# Patient Record
Sex: Male | Born: 1950 | Race: Black or African American | Hispanic: No | Marital: Married | Smoking: Former smoker
Health system: Southern US, Community
[De-identification: ages and names within clinical notes are randomized; demographics above are authoritative.]

## PROBLEM LIST (undated history)

## (undated) DIAGNOSIS — I639 Cerebral infarction, unspecified: Secondary | ICD-10-CM

## (undated) DIAGNOSIS — K8020 Calculus of gallbladder without cholecystitis without obstruction: Secondary | ICD-10-CM

## (undated) DIAGNOSIS — I5022 Chronic systolic (congestive) heart failure: Secondary | ICD-10-CM

## (undated) DIAGNOSIS — N186 End stage renal disease: Secondary | ICD-10-CM

## (undated) DIAGNOSIS — Z992 Dependence on renal dialysis: Secondary | ICD-10-CM

## (undated) DIAGNOSIS — I2510 Atherosclerotic heart disease of native coronary artery without angina pectoris: Secondary | ICD-10-CM

## (undated) HISTORY — PX: AV FISTULA PLACEMENT: SHX1204

---

## 1998-10-11 ENCOUNTER — Encounter: Payer: Self-pay | Admitting: Ophthalmology

## 1998-10-11 ENCOUNTER — Ambulatory Visit (HOSPITAL_COMMUNITY): Admission: RE | Admit: 1998-10-11 | Discharge: 1998-10-12 | Payer: Self-pay | Admitting: Ophthalmology

## 1999-04-11 ENCOUNTER — Ambulatory Visit (HOSPITAL_COMMUNITY): Admission: RE | Admit: 1999-04-11 | Discharge: 1999-04-12 | Payer: Self-pay | Admitting: Ophthalmology

## 2004-02-13 ENCOUNTER — Other Ambulatory Visit: Payer: Self-pay

## 2004-07-25 ENCOUNTER — Other Ambulatory Visit: Payer: Self-pay

## 2004-07-25 ENCOUNTER — Inpatient Hospital Stay: Payer: Self-pay

## 2005-05-04 ENCOUNTER — Ambulatory Visit: Payer: Self-pay | Admitting: Ophthalmology

## 2005-05-09 ENCOUNTER — Ambulatory Visit: Payer: Self-pay | Admitting: Ophthalmology

## 2006-01-16 ENCOUNTER — Ambulatory Visit: Payer: Self-pay | Admitting: Ophthalmology

## 2006-01-23 ENCOUNTER — Ambulatory Visit: Payer: Self-pay | Admitting: Ophthalmology

## 2006-10-07 ENCOUNTER — Other Ambulatory Visit: Payer: Self-pay

## 2006-10-07 ENCOUNTER — Inpatient Hospital Stay: Payer: Self-pay | Admitting: Internal Medicine

## 2006-12-16 ENCOUNTER — Ambulatory Visit: Payer: Self-pay | Admitting: Vascular Surgery

## 2007-01-20 ENCOUNTER — Ambulatory Visit: Payer: Self-pay | Admitting: Vascular Surgery

## 2007-01-20 ENCOUNTER — Other Ambulatory Visit: Payer: Self-pay

## 2007-01-23 ENCOUNTER — Ambulatory Visit: Payer: Self-pay | Admitting: Vascular Surgery

## 2007-04-29 ENCOUNTER — Ambulatory Visit: Payer: Self-pay | Admitting: Vascular Surgery

## 2008-12-23 ENCOUNTER — Ambulatory Visit: Payer: Self-pay | Admitting: Gastroenterology

## 2008-12-28 ENCOUNTER — Ambulatory Visit: Payer: Self-pay | Admitting: Gastroenterology

## 2009-01-02 LAB — COLONOSCOPY

## 2010-09-19 ENCOUNTER — Ambulatory Visit: Payer: Self-pay

## 2011-10-23 ENCOUNTER — Ambulatory Visit: Payer: Self-pay | Admitting: *Deleted

## 2012-08-26 DIAGNOSIS — Z7682 Awaiting organ transplant status: Secondary | ICD-10-CM | POA: Insufficient documentation

## 2012-12-25 DIAGNOSIS — R931 Abnormal findings on diagnostic imaging of heart and coronary circulation: Secondary | ICD-10-CM | POA: Insufficient documentation

## 2013-05-30 ENCOUNTER — Observation Stay: Payer: Self-pay | Admitting: Internal Medicine

## 2013-05-30 LAB — COMPREHENSIVE METABOLIC PANEL
Albumin: 3.4 g/dL (ref 3.4–5.0)
Alkaline Phosphatase: 104 U/L (ref 50–136)
Anion Gap: 10 (ref 7–16)
BUN: 29 mg/dL — ABNORMAL HIGH (ref 7–18)
Bilirubin,Total: 0.6 mg/dL (ref 0.2–1.0)
Calcium, Total: 8.9 mg/dL (ref 8.5–10.1)
Chloride: 99 mmol/L (ref 98–107)
Co2: 22 mmol/L (ref 21–32)
Creatinine: 9.22 mg/dL — ABNORMAL HIGH (ref 0.60–1.30)
EGFR (African American): 6 — ABNORMAL LOW
EGFR (Non-African Amer.): 6 — ABNORMAL LOW
Glucose: 143 mg/dL — ABNORMAL HIGH (ref 65–99)
Osmolality: 271 (ref 275–301)
Potassium: 5.5 mmol/L — ABNORMAL HIGH (ref 3.5–5.1)
SGOT(AST): 34 U/L (ref 15–37)
SGPT (ALT): 14 U/L (ref 12–78)
Sodium: 131 mmol/L — ABNORMAL LOW (ref 136–145)
Total Protein: 7.7 g/dL (ref 6.4–8.2)

## 2013-05-30 LAB — CBC
HCT: 37.3 % — ABNORMAL LOW (ref 40.0–52.0)
HGB: 12.7 g/dL — ABNORMAL LOW (ref 13.0–18.0)
MCH: 30.3 pg (ref 26.0–34.0)
MCHC: 34 g/dL (ref 32.0–36.0)
MCV: 89 fL (ref 80–100)
Platelet: 217 10*3/uL (ref 150–440)
RBC: 4.17 10*6/uL — ABNORMAL LOW (ref 4.40–5.90)
RDW: 13.7 % (ref 11.5–14.5)
WBC: 7.2 10*3/uL (ref 3.8–10.6)

## 2013-05-30 LAB — URINALYSIS, COMPLETE
Bilirubin,UR: NEGATIVE
Glucose,UR: NEGATIVE mg/dL (ref 0–75)
Hyaline Cast: 3
Ketone: NEGATIVE
Nitrite: NEGATIVE
Ph: 5 (ref 4.5–8.0)
Protein: >=500
RBC,UR: 182 /HPF (ref 0–5)
Specific Gravity: 1.016 (ref 1.003–1.030)
Squamous Epithelial: 1
WBC UR: 242 /HPF (ref 0–5)

## 2013-05-30 LAB — PROTIME-INR
INR: 1
Prothrombin Time: 13.4 secs (ref 11.5–14.7)

## 2013-05-30 LAB — APTT: Activated PTT: 27 secs (ref 23.6–35.9)

## 2013-05-30 LAB — TROPONIN I: Troponin-I: <0.02 ng/mL

## 2013-05-31 LAB — LIPID PANEL
Cholesterol: 184 mg/dL (ref 0–200)
HDL Cholesterol: 37 mg/dL — ABNORMAL LOW (ref 40–60)
Ldl Cholesterol, Calc: 111 mg/dL — ABNORMAL HIGH (ref 0–100)
Triglycerides: 182 mg/dL (ref 0–200)
VLDL Cholesterol, Calc: 36 mg/dL (ref 5–40)

## 2013-05-31 LAB — BASIC METABOLIC PANEL
Anion Gap: 8 (ref 7–16)
BUN: 37 mg/dL — ABNORMAL HIGH (ref 7–18)
Calcium, Total: 8.9 mg/dL (ref 8.5–10.1)
Chloride: 99 mmol/L (ref 98–107)
Co2: 30 mmol/L (ref 21–32)
Creatinine: 11.32 mg/dL — ABNORMAL HIGH (ref 0.60–1.30)
EGFR (African American): 5 — ABNORMAL LOW
EGFR (Non-African Amer.): 4 — ABNORMAL LOW
Glucose: 135 mg/dL — ABNORMAL HIGH (ref 65–99)
Osmolality: 285 (ref 275–301)
Potassium: 3.8 mmol/L (ref 3.5–5.1)
Sodium: 137 mmol/L (ref 136–145)

## 2013-05-31 LAB — URINE CULTURE

## 2013-08-21 DIAGNOSIS — N2581 Secondary hyperparathyroidism of renal origin: Secondary | ICD-10-CM | POA: Diagnosis not present

## 2013-08-21 DIAGNOSIS — E1129 Type 2 diabetes mellitus with other diabetic kidney complication: Secondary | ICD-10-CM | POA: Diagnosis not present

## 2013-08-21 DIAGNOSIS — D631 Anemia in chronic kidney disease: Secondary | ICD-10-CM | POA: Diagnosis not present

## 2013-08-21 DIAGNOSIS — N186 End stage renal disease: Secondary | ICD-10-CM | POA: Diagnosis not present

## 2013-09-14 DIAGNOSIS — H18419 Arcus senilis, unspecified eye: Secondary | ICD-10-CM | POA: Diagnosis not present

## 2013-09-14 DIAGNOSIS — E11319 Type 2 diabetes mellitus with unspecified diabetic retinopathy without macular edema: Secondary | ICD-10-CM | POA: Diagnosis not present

## 2013-09-14 DIAGNOSIS — H02839 Dermatochalasis of unspecified eye, unspecified eyelid: Secondary | ICD-10-CM | POA: Diagnosis not present

## 2013-09-14 DIAGNOSIS — H26499 Other secondary cataract, unspecified eye: Secondary | ICD-10-CM | POA: Diagnosis not present

## 2013-09-19 DIAGNOSIS — N186 End stage renal disease: Secondary | ICD-10-CM | POA: Diagnosis not present

## 2013-09-21 DIAGNOSIS — D631 Anemia in chronic kidney disease: Secondary | ICD-10-CM | POA: Diagnosis not present

## 2013-09-21 DIAGNOSIS — E1129 Type 2 diabetes mellitus with other diabetic kidney complication: Secondary | ICD-10-CM | POA: Diagnosis not present

## 2013-09-21 DIAGNOSIS — N186 End stage renal disease: Secondary | ICD-10-CM | POA: Diagnosis not present

## 2013-09-21 DIAGNOSIS — N039 Chronic nephritic syndrome with unspecified morphologic changes: Secondary | ICD-10-CM | POA: Diagnosis not present

## 2013-09-21 DIAGNOSIS — N2581 Secondary hyperparathyroidism of renal origin: Secondary | ICD-10-CM | POA: Diagnosis not present

## 2013-09-28 DIAGNOSIS — H26499 Other secondary cataract, unspecified eye: Secondary | ICD-10-CM | POA: Diagnosis not present

## 2013-10-08 DIAGNOSIS — H26499 Other secondary cataract, unspecified eye: Secondary | ICD-10-CM | POA: Diagnosis not present

## 2013-10-17 DIAGNOSIS — N186 End stage renal disease: Secondary | ICD-10-CM | POA: Diagnosis not present

## 2013-10-19 DIAGNOSIS — D509 Iron deficiency anemia, unspecified: Secondary | ICD-10-CM | POA: Diagnosis not present

## 2013-10-19 DIAGNOSIS — N2581 Secondary hyperparathyroidism of renal origin: Secondary | ICD-10-CM | POA: Diagnosis not present

## 2013-10-19 DIAGNOSIS — N186 End stage renal disease: Secondary | ICD-10-CM | POA: Diagnosis not present

## 2013-10-19 DIAGNOSIS — N039 Chronic nephritic syndrome with unspecified morphologic changes: Secondary | ICD-10-CM | POA: Diagnosis not present

## 2013-10-19 DIAGNOSIS — E1129 Type 2 diabetes mellitus with other diabetic kidney complication: Secondary | ICD-10-CM | POA: Diagnosis not present

## 2013-10-19 DIAGNOSIS — D631 Anemia in chronic kidney disease: Secondary | ICD-10-CM | POA: Diagnosis not present

## 2013-10-21 DIAGNOSIS — E1129 Type 2 diabetes mellitus with other diabetic kidney complication: Secondary | ICD-10-CM | POA: Diagnosis not present

## 2013-10-21 DIAGNOSIS — D631 Anemia in chronic kidney disease: Secondary | ICD-10-CM | POA: Diagnosis not present

## 2013-10-21 DIAGNOSIS — N2581 Secondary hyperparathyroidism of renal origin: Secondary | ICD-10-CM | POA: Diagnosis not present

## 2013-10-21 DIAGNOSIS — N186 End stage renal disease: Secondary | ICD-10-CM | POA: Diagnosis not present

## 2013-10-21 DIAGNOSIS — N039 Chronic nephritic syndrome with unspecified morphologic changes: Secondary | ICD-10-CM | POA: Diagnosis not present

## 2013-10-21 DIAGNOSIS — D509 Iron deficiency anemia, unspecified: Secondary | ICD-10-CM | POA: Diagnosis not present

## 2013-10-23 DIAGNOSIS — D509 Iron deficiency anemia, unspecified: Secondary | ICD-10-CM | POA: Diagnosis not present

## 2013-10-23 DIAGNOSIS — N186 End stage renal disease: Secondary | ICD-10-CM | POA: Diagnosis not present

## 2013-10-23 DIAGNOSIS — D631 Anemia in chronic kidney disease: Secondary | ICD-10-CM | POA: Diagnosis not present

## 2013-10-23 DIAGNOSIS — N2581 Secondary hyperparathyroidism of renal origin: Secondary | ICD-10-CM | POA: Diagnosis not present

## 2013-10-23 DIAGNOSIS — E1129 Type 2 diabetes mellitus with other diabetic kidney complication: Secondary | ICD-10-CM | POA: Diagnosis not present

## 2013-10-26 DIAGNOSIS — N186 End stage renal disease: Secondary | ICD-10-CM | POA: Diagnosis not present

## 2013-10-26 DIAGNOSIS — E1129 Type 2 diabetes mellitus with other diabetic kidney complication: Secondary | ICD-10-CM | POA: Diagnosis not present

## 2013-10-26 DIAGNOSIS — D509 Iron deficiency anemia, unspecified: Secondary | ICD-10-CM | POA: Diagnosis not present

## 2013-10-26 DIAGNOSIS — N2581 Secondary hyperparathyroidism of renal origin: Secondary | ICD-10-CM | POA: Diagnosis not present

## 2013-10-26 DIAGNOSIS — D631 Anemia in chronic kidney disease: Secondary | ICD-10-CM | POA: Diagnosis not present

## 2013-10-28 DIAGNOSIS — N039 Chronic nephritic syndrome with unspecified morphologic changes: Secondary | ICD-10-CM | POA: Diagnosis not present

## 2013-10-28 DIAGNOSIS — E1129 Type 2 diabetes mellitus with other diabetic kidney complication: Secondary | ICD-10-CM | POA: Diagnosis not present

## 2013-10-28 DIAGNOSIS — N186 End stage renal disease: Secondary | ICD-10-CM | POA: Diagnosis not present

## 2013-10-28 DIAGNOSIS — D631 Anemia in chronic kidney disease: Secondary | ICD-10-CM | POA: Diagnosis not present

## 2013-10-28 DIAGNOSIS — N2581 Secondary hyperparathyroidism of renal origin: Secondary | ICD-10-CM | POA: Diagnosis not present

## 2013-10-28 DIAGNOSIS — D509 Iron deficiency anemia, unspecified: Secondary | ICD-10-CM | POA: Diagnosis not present

## 2013-10-30 DIAGNOSIS — N2581 Secondary hyperparathyroidism of renal origin: Secondary | ICD-10-CM | POA: Diagnosis not present

## 2013-10-30 DIAGNOSIS — D631 Anemia in chronic kidney disease: Secondary | ICD-10-CM | POA: Diagnosis not present

## 2013-10-30 DIAGNOSIS — N186 End stage renal disease: Secondary | ICD-10-CM | POA: Diagnosis not present

## 2013-10-30 DIAGNOSIS — E1129 Type 2 diabetes mellitus with other diabetic kidney complication: Secondary | ICD-10-CM | POA: Diagnosis not present

## 2013-10-30 DIAGNOSIS — D509 Iron deficiency anemia, unspecified: Secondary | ICD-10-CM | POA: Diagnosis not present

## 2013-11-02 DIAGNOSIS — N2581 Secondary hyperparathyroidism of renal origin: Secondary | ICD-10-CM | POA: Diagnosis not present

## 2013-11-02 DIAGNOSIS — D631 Anemia in chronic kidney disease: Secondary | ICD-10-CM | POA: Diagnosis not present

## 2013-11-02 DIAGNOSIS — E1129 Type 2 diabetes mellitus with other diabetic kidney complication: Secondary | ICD-10-CM | POA: Diagnosis not present

## 2013-11-02 DIAGNOSIS — N186 End stage renal disease: Secondary | ICD-10-CM | POA: Diagnosis not present

## 2013-11-02 DIAGNOSIS — D509 Iron deficiency anemia, unspecified: Secondary | ICD-10-CM | POA: Diagnosis not present

## 2013-11-04 DIAGNOSIS — N2581 Secondary hyperparathyroidism of renal origin: Secondary | ICD-10-CM | POA: Diagnosis not present

## 2013-11-04 DIAGNOSIS — D631 Anemia in chronic kidney disease: Secondary | ICD-10-CM | POA: Diagnosis not present

## 2013-11-04 DIAGNOSIS — D509 Iron deficiency anemia, unspecified: Secondary | ICD-10-CM | POA: Diagnosis not present

## 2013-11-04 DIAGNOSIS — E1129 Type 2 diabetes mellitus with other diabetic kidney complication: Secondary | ICD-10-CM | POA: Diagnosis not present

## 2013-11-04 DIAGNOSIS — N186 End stage renal disease: Secondary | ICD-10-CM | POA: Diagnosis not present

## 2013-11-06 DIAGNOSIS — N2581 Secondary hyperparathyroidism of renal origin: Secondary | ICD-10-CM | POA: Diagnosis not present

## 2013-11-06 DIAGNOSIS — N186 End stage renal disease: Secondary | ICD-10-CM | POA: Diagnosis not present

## 2013-11-06 DIAGNOSIS — D631 Anemia in chronic kidney disease: Secondary | ICD-10-CM | POA: Diagnosis not present

## 2013-11-06 DIAGNOSIS — D509 Iron deficiency anemia, unspecified: Secondary | ICD-10-CM | POA: Diagnosis not present

## 2013-11-06 DIAGNOSIS — E1129 Type 2 diabetes mellitus with other diabetic kidney complication: Secondary | ICD-10-CM | POA: Diagnosis not present

## 2013-11-09 DIAGNOSIS — D631 Anemia in chronic kidney disease: Secondary | ICD-10-CM | POA: Diagnosis not present

## 2013-11-09 DIAGNOSIS — E1129 Type 2 diabetes mellitus with other diabetic kidney complication: Secondary | ICD-10-CM | POA: Diagnosis not present

## 2013-11-09 DIAGNOSIS — N186 End stage renal disease: Secondary | ICD-10-CM | POA: Diagnosis not present

## 2013-11-09 DIAGNOSIS — N039 Chronic nephritic syndrome with unspecified morphologic changes: Secondary | ICD-10-CM | POA: Diagnosis not present

## 2013-11-09 DIAGNOSIS — D509 Iron deficiency anemia, unspecified: Secondary | ICD-10-CM | POA: Diagnosis not present

## 2013-11-09 DIAGNOSIS — N2581 Secondary hyperparathyroidism of renal origin: Secondary | ICD-10-CM | POA: Diagnosis not present

## 2013-11-11 DIAGNOSIS — N2581 Secondary hyperparathyroidism of renal origin: Secondary | ICD-10-CM | POA: Diagnosis not present

## 2013-11-11 DIAGNOSIS — D631 Anemia in chronic kidney disease: Secondary | ICD-10-CM | POA: Diagnosis not present

## 2013-11-11 DIAGNOSIS — E1129 Type 2 diabetes mellitus with other diabetic kidney complication: Secondary | ICD-10-CM | POA: Diagnosis not present

## 2013-11-11 DIAGNOSIS — N186 End stage renal disease: Secondary | ICD-10-CM | POA: Diagnosis not present

## 2013-11-11 DIAGNOSIS — D509 Iron deficiency anemia, unspecified: Secondary | ICD-10-CM | POA: Diagnosis not present

## 2013-11-13 DIAGNOSIS — E1129 Type 2 diabetes mellitus with other diabetic kidney complication: Secondary | ICD-10-CM | POA: Diagnosis not present

## 2013-11-13 DIAGNOSIS — N2581 Secondary hyperparathyroidism of renal origin: Secondary | ICD-10-CM | POA: Diagnosis not present

## 2013-11-13 DIAGNOSIS — D631 Anemia in chronic kidney disease: Secondary | ICD-10-CM | POA: Diagnosis not present

## 2013-11-13 DIAGNOSIS — N186 End stage renal disease: Secondary | ICD-10-CM | POA: Diagnosis not present

## 2013-11-13 DIAGNOSIS — N039 Chronic nephritic syndrome with unspecified morphologic changes: Secondary | ICD-10-CM | POA: Diagnosis not present

## 2013-11-13 DIAGNOSIS — D509 Iron deficiency anemia, unspecified: Secondary | ICD-10-CM | POA: Diagnosis not present

## 2013-11-16 DIAGNOSIS — E1129 Type 2 diabetes mellitus with other diabetic kidney complication: Secondary | ICD-10-CM | POA: Diagnosis not present

## 2013-11-16 DIAGNOSIS — N186 End stage renal disease: Secondary | ICD-10-CM | POA: Diagnosis not present

## 2013-11-16 DIAGNOSIS — N2581 Secondary hyperparathyroidism of renal origin: Secondary | ICD-10-CM | POA: Diagnosis not present

## 2013-11-16 DIAGNOSIS — D509 Iron deficiency anemia, unspecified: Secondary | ICD-10-CM | POA: Diagnosis not present

## 2013-11-16 DIAGNOSIS — D631 Anemia in chronic kidney disease: Secondary | ICD-10-CM | POA: Diagnosis not present

## 2013-11-17 DIAGNOSIS — N186 End stage renal disease: Secondary | ICD-10-CM | POA: Diagnosis not present

## 2013-11-18 DIAGNOSIS — N039 Chronic nephritic syndrome with unspecified morphologic changes: Secondary | ICD-10-CM | POA: Diagnosis not present

## 2013-11-18 DIAGNOSIS — D509 Iron deficiency anemia, unspecified: Secondary | ICD-10-CM | POA: Diagnosis not present

## 2013-11-18 DIAGNOSIS — N186 End stage renal disease: Secondary | ICD-10-CM | POA: Diagnosis not present

## 2013-11-18 DIAGNOSIS — D631 Anemia in chronic kidney disease: Secondary | ICD-10-CM | POA: Diagnosis not present

## 2013-11-18 DIAGNOSIS — N2581 Secondary hyperparathyroidism of renal origin: Secondary | ICD-10-CM | POA: Diagnosis not present

## 2013-11-18 DIAGNOSIS — Z23 Encounter for immunization: Secondary | ICD-10-CM | POA: Diagnosis not present

## 2013-11-18 DIAGNOSIS — E1129 Type 2 diabetes mellitus with other diabetic kidney complication: Secondary | ICD-10-CM | POA: Diagnosis not present

## 2013-11-20 DIAGNOSIS — Z23 Encounter for immunization: Secondary | ICD-10-CM | POA: Diagnosis not present

## 2013-11-20 DIAGNOSIS — N186 End stage renal disease: Secondary | ICD-10-CM | POA: Diagnosis not present

## 2013-11-20 DIAGNOSIS — E1129 Type 2 diabetes mellitus with other diabetic kidney complication: Secondary | ICD-10-CM | POA: Diagnosis not present

## 2013-11-20 DIAGNOSIS — D509 Iron deficiency anemia, unspecified: Secondary | ICD-10-CM | POA: Diagnosis not present

## 2013-11-20 DIAGNOSIS — N2581 Secondary hyperparathyroidism of renal origin: Secondary | ICD-10-CM | POA: Diagnosis not present

## 2013-11-20 DIAGNOSIS — D631 Anemia in chronic kidney disease: Secondary | ICD-10-CM | POA: Diagnosis not present

## 2013-11-23 DIAGNOSIS — Z23 Encounter for immunization: Secondary | ICD-10-CM | POA: Diagnosis not present

## 2013-11-23 DIAGNOSIS — N039 Chronic nephritic syndrome with unspecified morphologic changes: Secondary | ICD-10-CM | POA: Diagnosis not present

## 2013-11-23 DIAGNOSIS — D509 Iron deficiency anemia, unspecified: Secondary | ICD-10-CM | POA: Diagnosis not present

## 2013-11-23 DIAGNOSIS — N186 End stage renal disease: Secondary | ICD-10-CM | POA: Diagnosis not present

## 2013-11-23 DIAGNOSIS — N2581 Secondary hyperparathyroidism of renal origin: Secondary | ICD-10-CM | POA: Diagnosis not present

## 2013-11-23 DIAGNOSIS — E1129 Type 2 diabetes mellitus with other diabetic kidney complication: Secondary | ICD-10-CM | POA: Diagnosis not present

## 2013-11-23 DIAGNOSIS — D631 Anemia in chronic kidney disease: Secondary | ICD-10-CM | POA: Diagnosis not present

## 2013-11-25 DIAGNOSIS — Z23 Encounter for immunization: Secondary | ICD-10-CM | POA: Diagnosis not present

## 2013-11-25 DIAGNOSIS — N2581 Secondary hyperparathyroidism of renal origin: Secondary | ICD-10-CM | POA: Diagnosis not present

## 2013-11-25 DIAGNOSIS — E1129 Type 2 diabetes mellitus with other diabetic kidney complication: Secondary | ICD-10-CM | POA: Diagnosis not present

## 2013-11-25 DIAGNOSIS — D631 Anemia in chronic kidney disease: Secondary | ICD-10-CM | POA: Diagnosis not present

## 2013-11-25 DIAGNOSIS — N186 End stage renal disease: Secondary | ICD-10-CM | POA: Diagnosis not present

## 2013-11-25 DIAGNOSIS — D509 Iron deficiency anemia, unspecified: Secondary | ICD-10-CM | POA: Diagnosis not present

## 2013-11-27 DIAGNOSIS — D509 Iron deficiency anemia, unspecified: Secondary | ICD-10-CM | POA: Diagnosis not present

## 2013-11-27 DIAGNOSIS — D631 Anemia in chronic kidney disease: Secondary | ICD-10-CM | POA: Diagnosis not present

## 2013-11-27 DIAGNOSIS — E1129 Type 2 diabetes mellitus with other diabetic kidney complication: Secondary | ICD-10-CM | POA: Diagnosis not present

## 2013-11-27 DIAGNOSIS — N039 Chronic nephritic syndrome with unspecified morphologic changes: Secondary | ICD-10-CM | POA: Diagnosis not present

## 2013-11-27 DIAGNOSIS — N186 End stage renal disease: Secondary | ICD-10-CM | POA: Diagnosis not present

## 2013-11-27 DIAGNOSIS — N2581 Secondary hyperparathyroidism of renal origin: Secondary | ICD-10-CM | POA: Diagnosis not present

## 2013-11-27 DIAGNOSIS — Z23 Encounter for immunization: Secondary | ICD-10-CM | POA: Diagnosis not present

## 2013-11-30 DIAGNOSIS — N2581 Secondary hyperparathyroidism of renal origin: Secondary | ICD-10-CM | POA: Diagnosis not present

## 2013-11-30 DIAGNOSIS — D631 Anemia in chronic kidney disease: Secondary | ICD-10-CM | POA: Diagnosis not present

## 2013-11-30 DIAGNOSIS — N039 Chronic nephritic syndrome with unspecified morphologic changes: Secondary | ICD-10-CM | POA: Diagnosis not present

## 2013-11-30 DIAGNOSIS — D509 Iron deficiency anemia, unspecified: Secondary | ICD-10-CM | POA: Diagnosis not present

## 2013-11-30 DIAGNOSIS — E1129 Type 2 diabetes mellitus with other diabetic kidney complication: Secondary | ICD-10-CM | POA: Diagnosis not present

## 2013-11-30 DIAGNOSIS — Z23 Encounter for immunization: Secondary | ICD-10-CM | POA: Diagnosis not present

## 2013-11-30 DIAGNOSIS — N186 End stage renal disease: Secondary | ICD-10-CM | POA: Diagnosis not present

## 2013-12-02 DIAGNOSIS — Z23 Encounter for immunization: Secondary | ICD-10-CM | POA: Diagnosis not present

## 2013-12-02 DIAGNOSIS — N2581 Secondary hyperparathyroidism of renal origin: Secondary | ICD-10-CM | POA: Diagnosis not present

## 2013-12-02 DIAGNOSIS — N186 End stage renal disease: Secondary | ICD-10-CM | POA: Diagnosis not present

## 2013-12-02 DIAGNOSIS — D509 Iron deficiency anemia, unspecified: Secondary | ICD-10-CM | POA: Diagnosis not present

## 2013-12-02 DIAGNOSIS — E1129 Type 2 diabetes mellitus with other diabetic kidney complication: Secondary | ICD-10-CM | POA: Diagnosis not present

## 2013-12-02 DIAGNOSIS — D631 Anemia in chronic kidney disease: Secondary | ICD-10-CM | POA: Diagnosis not present

## 2013-12-04 DIAGNOSIS — E1129 Type 2 diabetes mellitus with other diabetic kidney complication: Secondary | ICD-10-CM | POA: Diagnosis not present

## 2013-12-04 DIAGNOSIS — D509 Iron deficiency anemia, unspecified: Secondary | ICD-10-CM | POA: Diagnosis not present

## 2013-12-04 DIAGNOSIS — Z23 Encounter for immunization: Secondary | ICD-10-CM | POA: Diagnosis not present

## 2013-12-04 DIAGNOSIS — D631 Anemia in chronic kidney disease: Secondary | ICD-10-CM | POA: Diagnosis not present

## 2013-12-04 DIAGNOSIS — N186 End stage renal disease: Secondary | ICD-10-CM | POA: Diagnosis not present

## 2013-12-04 DIAGNOSIS — N2581 Secondary hyperparathyroidism of renal origin: Secondary | ICD-10-CM | POA: Diagnosis not present

## 2013-12-07 DIAGNOSIS — E1129 Type 2 diabetes mellitus with other diabetic kidney complication: Secondary | ICD-10-CM | POA: Diagnosis not present

## 2013-12-07 DIAGNOSIS — N186 End stage renal disease: Secondary | ICD-10-CM | POA: Diagnosis not present

## 2013-12-07 DIAGNOSIS — D509 Iron deficiency anemia, unspecified: Secondary | ICD-10-CM | POA: Diagnosis not present

## 2013-12-07 DIAGNOSIS — N2581 Secondary hyperparathyroidism of renal origin: Secondary | ICD-10-CM | POA: Diagnosis not present

## 2013-12-07 DIAGNOSIS — Z23 Encounter for immunization: Secondary | ICD-10-CM | POA: Diagnosis not present

## 2013-12-07 DIAGNOSIS — D631 Anemia in chronic kidney disease: Secondary | ICD-10-CM | POA: Diagnosis not present

## 2013-12-09 DIAGNOSIS — D631 Anemia in chronic kidney disease: Secondary | ICD-10-CM | POA: Diagnosis not present

## 2013-12-09 DIAGNOSIS — E1129 Type 2 diabetes mellitus with other diabetic kidney complication: Secondary | ICD-10-CM | POA: Diagnosis not present

## 2013-12-09 DIAGNOSIS — D509 Iron deficiency anemia, unspecified: Secondary | ICD-10-CM | POA: Diagnosis not present

## 2013-12-09 DIAGNOSIS — Z23 Encounter for immunization: Secondary | ICD-10-CM | POA: Diagnosis not present

## 2013-12-09 DIAGNOSIS — N2581 Secondary hyperparathyroidism of renal origin: Secondary | ICD-10-CM | POA: Diagnosis not present

## 2013-12-09 DIAGNOSIS — N186 End stage renal disease: Secondary | ICD-10-CM | POA: Diagnosis not present

## 2013-12-11 DIAGNOSIS — D631 Anemia in chronic kidney disease: Secondary | ICD-10-CM | POA: Diagnosis not present

## 2013-12-11 DIAGNOSIS — Z23 Encounter for immunization: Secondary | ICD-10-CM | POA: Diagnosis not present

## 2013-12-11 DIAGNOSIS — N186 End stage renal disease: Secondary | ICD-10-CM | POA: Diagnosis not present

## 2013-12-11 DIAGNOSIS — D509 Iron deficiency anemia, unspecified: Secondary | ICD-10-CM | POA: Diagnosis not present

## 2013-12-11 DIAGNOSIS — E1129 Type 2 diabetes mellitus with other diabetic kidney complication: Secondary | ICD-10-CM | POA: Diagnosis not present

## 2013-12-11 DIAGNOSIS — N2581 Secondary hyperparathyroidism of renal origin: Secondary | ICD-10-CM | POA: Diagnosis not present

## 2013-12-14 DIAGNOSIS — E1129 Type 2 diabetes mellitus with other diabetic kidney complication: Secondary | ICD-10-CM | POA: Diagnosis not present

## 2013-12-14 DIAGNOSIS — D631 Anemia in chronic kidney disease: Secondary | ICD-10-CM | POA: Diagnosis not present

## 2013-12-14 DIAGNOSIS — N2581 Secondary hyperparathyroidism of renal origin: Secondary | ICD-10-CM | POA: Diagnosis not present

## 2013-12-14 DIAGNOSIS — Z23 Encounter for immunization: Secondary | ICD-10-CM | POA: Diagnosis not present

## 2013-12-14 DIAGNOSIS — N186 End stage renal disease: Secondary | ICD-10-CM | POA: Diagnosis not present

## 2013-12-14 DIAGNOSIS — D509 Iron deficiency anemia, unspecified: Secondary | ICD-10-CM | POA: Diagnosis not present

## 2013-12-16 DIAGNOSIS — D631 Anemia in chronic kidney disease: Secondary | ICD-10-CM | POA: Diagnosis not present

## 2013-12-16 DIAGNOSIS — Z23 Encounter for immunization: Secondary | ICD-10-CM | POA: Diagnosis not present

## 2013-12-16 DIAGNOSIS — N186 End stage renal disease: Secondary | ICD-10-CM | POA: Diagnosis not present

## 2013-12-16 DIAGNOSIS — N2581 Secondary hyperparathyroidism of renal origin: Secondary | ICD-10-CM | POA: Diagnosis not present

## 2013-12-16 DIAGNOSIS — E1129 Type 2 diabetes mellitus with other diabetic kidney complication: Secondary | ICD-10-CM | POA: Diagnosis not present

## 2013-12-16 DIAGNOSIS — N039 Chronic nephritic syndrome with unspecified morphologic changes: Secondary | ICD-10-CM | POA: Diagnosis not present

## 2013-12-16 DIAGNOSIS — D509 Iron deficiency anemia, unspecified: Secondary | ICD-10-CM | POA: Diagnosis not present

## 2013-12-17 DIAGNOSIS — N186 End stage renal disease: Secondary | ICD-10-CM | POA: Diagnosis not present

## 2013-12-18 DIAGNOSIS — D509 Iron deficiency anemia, unspecified: Secondary | ICD-10-CM | POA: Diagnosis not present

## 2013-12-18 DIAGNOSIS — E1129 Type 2 diabetes mellitus with other diabetic kidney complication: Secondary | ICD-10-CM | POA: Diagnosis not present

## 2013-12-18 DIAGNOSIS — N186 End stage renal disease: Secondary | ICD-10-CM | POA: Diagnosis not present

## 2013-12-18 DIAGNOSIS — N2581 Secondary hyperparathyroidism of renal origin: Secondary | ICD-10-CM | POA: Diagnosis not present

## 2014-01-17 DIAGNOSIS — N186 End stage renal disease: Secondary | ICD-10-CM | POA: Diagnosis not present

## 2014-01-18 DIAGNOSIS — E1129 Type 2 diabetes mellitus with other diabetic kidney complication: Secondary | ICD-10-CM | POA: Diagnosis not present

## 2014-01-18 DIAGNOSIS — N2581 Secondary hyperparathyroidism of renal origin: Secondary | ICD-10-CM | POA: Diagnosis not present

## 2014-01-18 DIAGNOSIS — D509 Iron deficiency anemia, unspecified: Secondary | ICD-10-CM | POA: Diagnosis not present

## 2014-01-18 DIAGNOSIS — N186 End stage renal disease: Secondary | ICD-10-CM | POA: Diagnosis not present

## 2014-01-20 DIAGNOSIS — N186 End stage renal disease: Secondary | ICD-10-CM | POA: Diagnosis not present

## 2014-01-20 DIAGNOSIS — D509 Iron deficiency anemia, unspecified: Secondary | ICD-10-CM | POA: Diagnosis not present

## 2014-01-20 DIAGNOSIS — N2581 Secondary hyperparathyroidism of renal origin: Secondary | ICD-10-CM | POA: Diagnosis not present

## 2014-01-20 DIAGNOSIS — E1129 Type 2 diabetes mellitus with other diabetic kidney complication: Secondary | ICD-10-CM | POA: Diagnosis not present

## 2014-01-22 DIAGNOSIS — N186 End stage renal disease: Secondary | ICD-10-CM | POA: Diagnosis not present

## 2014-01-22 DIAGNOSIS — D509 Iron deficiency anemia, unspecified: Secondary | ICD-10-CM | POA: Diagnosis not present

## 2014-01-22 DIAGNOSIS — E1129 Type 2 diabetes mellitus with other diabetic kidney complication: Secondary | ICD-10-CM | POA: Diagnosis not present

## 2014-01-22 DIAGNOSIS — N2581 Secondary hyperparathyroidism of renal origin: Secondary | ICD-10-CM | POA: Diagnosis not present

## 2014-01-25 DIAGNOSIS — E1129 Type 2 diabetes mellitus with other diabetic kidney complication: Secondary | ICD-10-CM | POA: Diagnosis not present

## 2014-01-25 DIAGNOSIS — D509 Iron deficiency anemia, unspecified: Secondary | ICD-10-CM | POA: Diagnosis not present

## 2014-01-25 DIAGNOSIS — N2581 Secondary hyperparathyroidism of renal origin: Secondary | ICD-10-CM | POA: Diagnosis not present

## 2014-01-25 DIAGNOSIS — N186 End stage renal disease: Secondary | ICD-10-CM | POA: Diagnosis not present

## 2014-01-27 DIAGNOSIS — N186 End stage renal disease: Secondary | ICD-10-CM | POA: Diagnosis not present

## 2014-01-27 DIAGNOSIS — D509 Iron deficiency anemia, unspecified: Secondary | ICD-10-CM | POA: Diagnosis not present

## 2014-01-27 DIAGNOSIS — N2581 Secondary hyperparathyroidism of renal origin: Secondary | ICD-10-CM | POA: Diagnosis not present

## 2014-01-27 DIAGNOSIS — E1129 Type 2 diabetes mellitus with other diabetic kidney complication: Secondary | ICD-10-CM | POA: Diagnosis not present

## 2014-01-29 DIAGNOSIS — D509 Iron deficiency anemia, unspecified: Secondary | ICD-10-CM | POA: Diagnosis not present

## 2014-01-29 DIAGNOSIS — N186 End stage renal disease: Secondary | ICD-10-CM | POA: Diagnosis not present

## 2014-01-29 DIAGNOSIS — N2581 Secondary hyperparathyroidism of renal origin: Secondary | ICD-10-CM | POA: Diagnosis not present

## 2014-01-29 DIAGNOSIS — E1129 Type 2 diabetes mellitus with other diabetic kidney complication: Secondary | ICD-10-CM | POA: Diagnosis not present

## 2014-02-01 DIAGNOSIS — N2581 Secondary hyperparathyroidism of renal origin: Secondary | ICD-10-CM | POA: Diagnosis not present

## 2014-02-01 DIAGNOSIS — N186 End stage renal disease: Secondary | ICD-10-CM | POA: Diagnosis not present

## 2014-02-01 DIAGNOSIS — D509 Iron deficiency anemia, unspecified: Secondary | ICD-10-CM | POA: Diagnosis not present

## 2014-02-01 DIAGNOSIS — E1129 Type 2 diabetes mellitus with other diabetic kidney complication: Secondary | ICD-10-CM | POA: Diagnosis not present

## 2014-02-03 DIAGNOSIS — E1129 Type 2 diabetes mellitus with other diabetic kidney complication: Secondary | ICD-10-CM | POA: Diagnosis not present

## 2014-02-03 DIAGNOSIS — D509 Iron deficiency anemia, unspecified: Secondary | ICD-10-CM | POA: Diagnosis not present

## 2014-02-03 DIAGNOSIS — N2581 Secondary hyperparathyroidism of renal origin: Secondary | ICD-10-CM | POA: Diagnosis not present

## 2014-02-03 DIAGNOSIS — N186 End stage renal disease: Secondary | ICD-10-CM | POA: Diagnosis not present

## 2014-02-05 DIAGNOSIS — E1129 Type 2 diabetes mellitus with other diabetic kidney complication: Secondary | ICD-10-CM | POA: Diagnosis not present

## 2014-02-05 DIAGNOSIS — N2581 Secondary hyperparathyroidism of renal origin: Secondary | ICD-10-CM | POA: Diagnosis not present

## 2014-02-05 DIAGNOSIS — N186 End stage renal disease: Secondary | ICD-10-CM | POA: Diagnosis not present

## 2014-02-05 DIAGNOSIS — D509 Iron deficiency anemia, unspecified: Secondary | ICD-10-CM | POA: Diagnosis not present

## 2014-02-08 DIAGNOSIS — N186 End stage renal disease: Secondary | ICD-10-CM | POA: Diagnosis not present

## 2014-02-08 DIAGNOSIS — N2581 Secondary hyperparathyroidism of renal origin: Secondary | ICD-10-CM | POA: Diagnosis not present

## 2014-02-08 DIAGNOSIS — D509 Iron deficiency anemia, unspecified: Secondary | ICD-10-CM | POA: Diagnosis not present

## 2014-02-08 DIAGNOSIS — E1129 Type 2 diabetes mellitus with other diabetic kidney complication: Secondary | ICD-10-CM | POA: Diagnosis not present

## 2014-02-10 DIAGNOSIS — N2581 Secondary hyperparathyroidism of renal origin: Secondary | ICD-10-CM | POA: Diagnosis not present

## 2014-02-10 DIAGNOSIS — N186 End stage renal disease: Secondary | ICD-10-CM | POA: Diagnosis not present

## 2014-02-10 DIAGNOSIS — E1129 Type 2 diabetes mellitus with other diabetic kidney complication: Secondary | ICD-10-CM | POA: Diagnosis not present

## 2014-02-10 DIAGNOSIS — D509 Iron deficiency anemia, unspecified: Secondary | ICD-10-CM | POA: Diagnosis not present

## 2014-02-12 DIAGNOSIS — E1129 Type 2 diabetes mellitus with other diabetic kidney complication: Secondary | ICD-10-CM | POA: Diagnosis not present

## 2014-02-12 DIAGNOSIS — N186 End stage renal disease: Secondary | ICD-10-CM | POA: Diagnosis not present

## 2014-02-12 DIAGNOSIS — D509 Iron deficiency anemia, unspecified: Secondary | ICD-10-CM | POA: Diagnosis not present

## 2014-02-12 DIAGNOSIS — N2581 Secondary hyperparathyroidism of renal origin: Secondary | ICD-10-CM | POA: Diagnosis not present

## 2014-02-15 DIAGNOSIS — N186 End stage renal disease: Secondary | ICD-10-CM | POA: Diagnosis not present

## 2014-02-15 DIAGNOSIS — D509 Iron deficiency anemia, unspecified: Secondary | ICD-10-CM | POA: Diagnosis not present

## 2014-02-15 DIAGNOSIS — N2581 Secondary hyperparathyroidism of renal origin: Secondary | ICD-10-CM | POA: Diagnosis not present

## 2014-02-15 DIAGNOSIS — E1129 Type 2 diabetes mellitus with other diabetic kidney complication: Secondary | ICD-10-CM | POA: Diagnosis not present

## 2014-02-16 DIAGNOSIS — N186 End stage renal disease: Secondary | ICD-10-CM | POA: Diagnosis not present

## 2014-02-17 DIAGNOSIS — E1129 Type 2 diabetes mellitus with other diabetic kidney complication: Secondary | ICD-10-CM | POA: Diagnosis not present

## 2014-02-17 DIAGNOSIS — D631 Anemia in chronic kidney disease: Secondary | ICD-10-CM | POA: Diagnosis not present

## 2014-02-17 DIAGNOSIS — N2581 Secondary hyperparathyroidism of renal origin: Secondary | ICD-10-CM | POA: Diagnosis not present

## 2014-02-17 DIAGNOSIS — N186 End stage renal disease: Secondary | ICD-10-CM | POA: Diagnosis not present

## 2014-02-19 DIAGNOSIS — N2581 Secondary hyperparathyroidism of renal origin: Secondary | ICD-10-CM | POA: Diagnosis not present

## 2014-02-19 DIAGNOSIS — E1129 Type 2 diabetes mellitus with other diabetic kidney complication: Secondary | ICD-10-CM | POA: Diagnosis not present

## 2014-02-19 DIAGNOSIS — D631 Anemia in chronic kidney disease: Secondary | ICD-10-CM | POA: Diagnosis not present

## 2014-02-19 DIAGNOSIS — N186 End stage renal disease: Secondary | ICD-10-CM | POA: Diagnosis not present

## 2014-02-22 DIAGNOSIS — D631 Anemia in chronic kidney disease: Secondary | ICD-10-CM | POA: Diagnosis not present

## 2014-02-22 DIAGNOSIS — E1129 Type 2 diabetes mellitus with other diabetic kidney complication: Secondary | ICD-10-CM | POA: Diagnosis not present

## 2014-02-22 DIAGNOSIS — N2581 Secondary hyperparathyroidism of renal origin: Secondary | ICD-10-CM | POA: Diagnosis not present

## 2014-02-22 DIAGNOSIS — N186 End stage renal disease: Secondary | ICD-10-CM | POA: Diagnosis not present

## 2014-02-22 DIAGNOSIS — N039 Chronic nephritic syndrome with unspecified morphologic changes: Secondary | ICD-10-CM | POA: Diagnosis not present

## 2014-02-24 DIAGNOSIS — D631 Anemia in chronic kidney disease: Secondary | ICD-10-CM | POA: Diagnosis not present

## 2014-02-24 DIAGNOSIS — N2581 Secondary hyperparathyroidism of renal origin: Secondary | ICD-10-CM | POA: Diagnosis not present

## 2014-02-24 DIAGNOSIS — N186 End stage renal disease: Secondary | ICD-10-CM | POA: Diagnosis not present

## 2014-02-24 DIAGNOSIS — E1129 Type 2 diabetes mellitus with other diabetic kidney complication: Secondary | ICD-10-CM | POA: Diagnosis not present

## 2014-02-26 DIAGNOSIS — D631 Anemia in chronic kidney disease: Secondary | ICD-10-CM | POA: Diagnosis not present

## 2014-02-26 DIAGNOSIS — N2581 Secondary hyperparathyroidism of renal origin: Secondary | ICD-10-CM | POA: Diagnosis not present

## 2014-02-26 DIAGNOSIS — N186 End stage renal disease: Secondary | ICD-10-CM | POA: Diagnosis not present

## 2014-02-26 DIAGNOSIS — E1129 Type 2 diabetes mellitus with other diabetic kidney complication: Secondary | ICD-10-CM | POA: Diagnosis not present

## 2014-03-01 DIAGNOSIS — E1129 Type 2 diabetes mellitus with other diabetic kidney complication: Secondary | ICD-10-CM | POA: Diagnosis not present

## 2014-03-01 DIAGNOSIS — N039 Chronic nephritic syndrome with unspecified morphologic changes: Secondary | ICD-10-CM | POA: Diagnosis not present

## 2014-03-01 DIAGNOSIS — N2581 Secondary hyperparathyroidism of renal origin: Secondary | ICD-10-CM | POA: Diagnosis not present

## 2014-03-01 DIAGNOSIS — N186 End stage renal disease: Secondary | ICD-10-CM | POA: Diagnosis not present

## 2014-03-01 DIAGNOSIS — D631 Anemia in chronic kidney disease: Secondary | ICD-10-CM | POA: Diagnosis not present

## 2014-03-03 DIAGNOSIS — E1129 Type 2 diabetes mellitus with other diabetic kidney complication: Secondary | ICD-10-CM | POA: Diagnosis not present

## 2014-03-03 DIAGNOSIS — N2581 Secondary hyperparathyroidism of renal origin: Secondary | ICD-10-CM | POA: Diagnosis not present

## 2014-03-03 DIAGNOSIS — N186 End stage renal disease: Secondary | ICD-10-CM | POA: Diagnosis not present

## 2014-03-03 DIAGNOSIS — D631 Anemia in chronic kidney disease: Secondary | ICD-10-CM | POA: Diagnosis not present

## 2014-03-05 DIAGNOSIS — E1129 Type 2 diabetes mellitus with other diabetic kidney complication: Secondary | ICD-10-CM | POA: Diagnosis not present

## 2014-03-05 DIAGNOSIS — N186 End stage renal disease: Secondary | ICD-10-CM | POA: Diagnosis not present

## 2014-03-05 DIAGNOSIS — D631 Anemia in chronic kidney disease: Secondary | ICD-10-CM | POA: Diagnosis not present

## 2014-03-05 DIAGNOSIS — N2581 Secondary hyperparathyroidism of renal origin: Secondary | ICD-10-CM | POA: Diagnosis not present

## 2014-03-08 DIAGNOSIS — E1129 Type 2 diabetes mellitus with other diabetic kidney complication: Secondary | ICD-10-CM | POA: Diagnosis not present

## 2014-03-08 DIAGNOSIS — N2581 Secondary hyperparathyroidism of renal origin: Secondary | ICD-10-CM | POA: Diagnosis not present

## 2014-03-08 DIAGNOSIS — N186 End stage renal disease: Secondary | ICD-10-CM | POA: Diagnosis not present

## 2014-03-08 DIAGNOSIS — D631 Anemia in chronic kidney disease: Secondary | ICD-10-CM | POA: Diagnosis not present

## 2014-03-10 DIAGNOSIS — D631 Anemia in chronic kidney disease: Secondary | ICD-10-CM | POA: Diagnosis not present

## 2014-03-10 DIAGNOSIS — E1129 Type 2 diabetes mellitus with other diabetic kidney complication: Secondary | ICD-10-CM | POA: Diagnosis not present

## 2014-03-10 DIAGNOSIS — N2581 Secondary hyperparathyroidism of renal origin: Secondary | ICD-10-CM | POA: Diagnosis not present

## 2014-03-10 DIAGNOSIS — N186 End stage renal disease: Secondary | ICD-10-CM | POA: Diagnosis not present

## 2014-03-12 DIAGNOSIS — D631 Anemia in chronic kidney disease: Secondary | ICD-10-CM | POA: Diagnosis not present

## 2014-03-12 DIAGNOSIS — N186 End stage renal disease: Secondary | ICD-10-CM | POA: Diagnosis not present

## 2014-03-12 DIAGNOSIS — N2581 Secondary hyperparathyroidism of renal origin: Secondary | ICD-10-CM | POA: Diagnosis not present

## 2014-03-12 DIAGNOSIS — E1129 Type 2 diabetes mellitus with other diabetic kidney complication: Secondary | ICD-10-CM | POA: Diagnosis not present

## 2014-03-15 DIAGNOSIS — N2581 Secondary hyperparathyroidism of renal origin: Secondary | ICD-10-CM | POA: Diagnosis not present

## 2014-03-15 DIAGNOSIS — E1129 Type 2 diabetes mellitus with other diabetic kidney complication: Secondary | ICD-10-CM | POA: Diagnosis not present

## 2014-03-15 DIAGNOSIS — N039 Chronic nephritic syndrome with unspecified morphologic changes: Secondary | ICD-10-CM | POA: Diagnosis not present

## 2014-03-15 DIAGNOSIS — N186 End stage renal disease: Secondary | ICD-10-CM | POA: Diagnosis not present

## 2014-03-15 DIAGNOSIS — D631 Anemia in chronic kidney disease: Secondary | ICD-10-CM | POA: Diagnosis not present

## 2014-03-17 DIAGNOSIS — D631 Anemia in chronic kidney disease: Secondary | ICD-10-CM | POA: Diagnosis not present

## 2014-03-17 DIAGNOSIS — N186 End stage renal disease: Secondary | ICD-10-CM | POA: Diagnosis not present

## 2014-03-17 DIAGNOSIS — N039 Chronic nephritic syndrome with unspecified morphologic changes: Secondary | ICD-10-CM | POA: Diagnosis not present

## 2014-03-17 DIAGNOSIS — N2581 Secondary hyperparathyroidism of renal origin: Secondary | ICD-10-CM | POA: Diagnosis not present

## 2014-03-17 DIAGNOSIS — E1129 Type 2 diabetes mellitus with other diabetic kidney complication: Secondary | ICD-10-CM | POA: Diagnosis not present

## 2014-03-19 DIAGNOSIS — D631 Anemia in chronic kidney disease: Secondary | ICD-10-CM | POA: Diagnosis not present

## 2014-03-19 DIAGNOSIS — E1129 Type 2 diabetes mellitus with other diabetic kidney complication: Secondary | ICD-10-CM | POA: Diagnosis not present

## 2014-03-19 DIAGNOSIS — N186 End stage renal disease: Secondary | ICD-10-CM | POA: Diagnosis not present

## 2014-03-19 DIAGNOSIS — N2581 Secondary hyperparathyroidism of renal origin: Secondary | ICD-10-CM | POA: Diagnosis not present

## 2014-03-22 DIAGNOSIS — N2581 Secondary hyperparathyroidism of renal origin: Secondary | ICD-10-CM | POA: Diagnosis not present

## 2014-03-22 DIAGNOSIS — N186 End stage renal disease: Secondary | ICD-10-CM | POA: Diagnosis not present

## 2014-03-22 DIAGNOSIS — E1129 Type 2 diabetes mellitus with other diabetic kidney complication: Secondary | ICD-10-CM | POA: Diagnosis not present

## 2014-03-24 DIAGNOSIS — N2581 Secondary hyperparathyroidism of renal origin: Secondary | ICD-10-CM | POA: Diagnosis not present

## 2014-03-24 DIAGNOSIS — N186 End stage renal disease: Secondary | ICD-10-CM | POA: Diagnosis not present

## 2014-03-24 DIAGNOSIS — E1129 Type 2 diabetes mellitus with other diabetic kidney complication: Secondary | ICD-10-CM | POA: Diagnosis not present

## 2014-03-26 DIAGNOSIS — N2581 Secondary hyperparathyroidism of renal origin: Secondary | ICD-10-CM | POA: Diagnosis not present

## 2014-03-26 DIAGNOSIS — N186 End stage renal disease: Secondary | ICD-10-CM | POA: Diagnosis not present

## 2014-03-30 DIAGNOSIS — N2581 Secondary hyperparathyroidism of renal origin: Secondary | ICD-10-CM | POA: Diagnosis not present

## 2014-03-30 DIAGNOSIS — N186 End stage renal disease: Secondary | ICD-10-CM | POA: Diagnosis not present

## 2014-03-30 DIAGNOSIS — E1129 Type 2 diabetes mellitus with other diabetic kidney complication: Secondary | ICD-10-CM | POA: Diagnosis not present

## 2014-03-31 DIAGNOSIS — N186 End stage renal disease: Secondary | ICD-10-CM | POA: Diagnosis not present

## 2014-03-31 DIAGNOSIS — E1129 Type 2 diabetes mellitus with other diabetic kidney complication: Secondary | ICD-10-CM | POA: Diagnosis not present

## 2014-03-31 DIAGNOSIS — N2581 Secondary hyperparathyroidism of renal origin: Secondary | ICD-10-CM | POA: Diagnosis not present

## 2014-04-02 DIAGNOSIS — N2581 Secondary hyperparathyroidism of renal origin: Secondary | ICD-10-CM | POA: Diagnosis not present

## 2014-04-02 DIAGNOSIS — E1129 Type 2 diabetes mellitus with other diabetic kidney complication: Secondary | ICD-10-CM | POA: Diagnosis not present

## 2014-04-02 DIAGNOSIS — N186 End stage renal disease: Secondary | ICD-10-CM | POA: Diagnosis not present

## 2014-04-05 DIAGNOSIS — N186 End stage renal disease: Secondary | ICD-10-CM | POA: Diagnosis not present

## 2014-04-05 DIAGNOSIS — N2581 Secondary hyperparathyroidism of renal origin: Secondary | ICD-10-CM | POA: Diagnosis not present

## 2014-04-05 DIAGNOSIS — E1129 Type 2 diabetes mellitus with other diabetic kidney complication: Secondary | ICD-10-CM | POA: Diagnosis not present

## 2014-04-07 DIAGNOSIS — N2581 Secondary hyperparathyroidism of renal origin: Secondary | ICD-10-CM | POA: Diagnosis not present

## 2014-04-07 DIAGNOSIS — E1129 Type 2 diabetes mellitus with other diabetic kidney complication: Secondary | ICD-10-CM | POA: Diagnosis not present

## 2014-04-07 DIAGNOSIS — N186 End stage renal disease: Secondary | ICD-10-CM | POA: Diagnosis not present

## 2014-04-09 DIAGNOSIS — N2581 Secondary hyperparathyroidism of renal origin: Secondary | ICD-10-CM | POA: Diagnosis not present

## 2014-04-09 DIAGNOSIS — N186 End stage renal disease: Secondary | ICD-10-CM | POA: Diagnosis not present

## 2014-04-09 DIAGNOSIS — E1129 Type 2 diabetes mellitus with other diabetic kidney complication: Secondary | ICD-10-CM | POA: Diagnosis not present

## 2014-04-12 DIAGNOSIS — E1129 Type 2 diabetes mellitus with other diabetic kidney complication: Secondary | ICD-10-CM | POA: Diagnosis not present

## 2014-04-12 DIAGNOSIS — N186 End stage renal disease: Secondary | ICD-10-CM | POA: Diagnosis not present

## 2014-04-12 DIAGNOSIS — N2581 Secondary hyperparathyroidism of renal origin: Secondary | ICD-10-CM | POA: Diagnosis not present

## 2014-04-14 DIAGNOSIS — E1129 Type 2 diabetes mellitus with other diabetic kidney complication: Secondary | ICD-10-CM | POA: Diagnosis not present

## 2014-04-14 DIAGNOSIS — N186 End stage renal disease: Secondary | ICD-10-CM | POA: Diagnosis not present

## 2014-04-14 DIAGNOSIS — N2581 Secondary hyperparathyroidism of renal origin: Secondary | ICD-10-CM | POA: Diagnosis not present

## 2014-04-16 DIAGNOSIS — N2581 Secondary hyperparathyroidism of renal origin: Secondary | ICD-10-CM | POA: Diagnosis not present

## 2014-04-16 DIAGNOSIS — N186 End stage renal disease: Secondary | ICD-10-CM | POA: Diagnosis not present

## 2014-04-16 DIAGNOSIS — E1129 Type 2 diabetes mellitus with other diabetic kidney complication: Secondary | ICD-10-CM | POA: Diagnosis not present

## 2014-04-19 DIAGNOSIS — N2581 Secondary hyperparathyroidism of renal origin: Secondary | ICD-10-CM | POA: Diagnosis not present

## 2014-04-19 DIAGNOSIS — E1129 Type 2 diabetes mellitus with other diabetic kidney complication: Secondary | ICD-10-CM | POA: Diagnosis not present

## 2014-04-19 DIAGNOSIS — N186 End stage renal disease: Secondary | ICD-10-CM | POA: Diagnosis not present

## 2014-04-21 DIAGNOSIS — E1129 Type 2 diabetes mellitus with other diabetic kidney complication: Secondary | ICD-10-CM | POA: Diagnosis not present

## 2014-04-21 DIAGNOSIS — N186 End stage renal disease: Secondary | ICD-10-CM | POA: Diagnosis not present

## 2014-04-21 DIAGNOSIS — D509 Iron deficiency anemia, unspecified: Secondary | ICD-10-CM | POA: Diagnosis not present

## 2014-04-21 DIAGNOSIS — N2581 Secondary hyperparathyroidism of renal origin: Secondary | ICD-10-CM | POA: Diagnosis not present

## 2014-05-05 DIAGNOSIS — E1129 Type 2 diabetes mellitus with other diabetic kidney complication: Secondary | ICD-10-CM | POA: Diagnosis not present

## 2014-05-19 DIAGNOSIS — N186 End stage renal disease: Secondary | ICD-10-CM | POA: Diagnosis not present

## 2014-05-21 DIAGNOSIS — Z23 Encounter for immunization: Secondary | ICD-10-CM | POA: Diagnosis not present

## 2014-05-21 DIAGNOSIS — D631 Anemia in chronic kidney disease: Secondary | ICD-10-CM | POA: Diagnosis not present

## 2014-05-21 DIAGNOSIS — N186 End stage renal disease: Secondary | ICD-10-CM | POA: Diagnosis not present

## 2014-05-21 DIAGNOSIS — E1129 Type 2 diabetes mellitus with other diabetic kidney complication: Secondary | ICD-10-CM | POA: Diagnosis not present

## 2014-05-21 DIAGNOSIS — D509 Iron deficiency anemia, unspecified: Secondary | ICD-10-CM | POA: Diagnosis not present

## 2014-05-21 DIAGNOSIS — N2581 Secondary hyperparathyroidism of renal origin: Secondary | ICD-10-CM | POA: Diagnosis not present

## 2014-06-19 DIAGNOSIS — Z992 Dependence on renal dialysis: Secondary | ICD-10-CM | POA: Diagnosis not present

## 2014-06-19 DIAGNOSIS — N186 End stage renal disease: Secondary | ICD-10-CM | POA: Diagnosis not present

## 2014-06-19 DIAGNOSIS — E1122 Type 2 diabetes mellitus with diabetic chronic kidney disease: Secondary | ICD-10-CM | POA: Diagnosis not present

## 2014-06-21 DIAGNOSIS — D631 Anemia in chronic kidney disease: Secondary | ICD-10-CM | POA: Diagnosis not present

## 2014-06-21 DIAGNOSIS — D509 Iron deficiency anemia, unspecified: Secondary | ICD-10-CM | POA: Diagnosis not present

## 2014-06-21 DIAGNOSIS — N2581 Secondary hyperparathyroidism of renal origin: Secondary | ICD-10-CM | POA: Diagnosis not present

## 2014-06-21 DIAGNOSIS — N186 End stage renal disease: Secondary | ICD-10-CM | POA: Diagnosis not present

## 2014-06-21 DIAGNOSIS — E1129 Type 2 diabetes mellitus with other diabetic kidney complication: Secondary | ICD-10-CM | POA: Diagnosis not present

## 2014-07-19 DIAGNOSIS — E1122 Type 2 diabetes mellitus with diabetic chronic kidney disease: Secondary | ICD-10-CM | POA: Diagnosis not present

## 2014-07-19 DIAGNOSIS — N186 End stage renal disease: Secondary | ICD-10-CM | POA: Diagnosis not present

## 2014-07-19 DIAGNOSIS — Z992 Dependence on renal dialysis: Secondary | ICD-10-CM | POA: Diagnosis not present

## 2014-07-21 DIAGNOSIS — D631 Anemia in chronic kidney disease: Secondary | ICD-10-CM | POA: Diagnosis not present

## 2014-07-21 DIAGNOSIS — E1129 Type 2 diabetes mellitus with other diabetic kidney complication: Secondary | ICD-10-CM | POA: Diagnosis not present

## 2014-07-21 DIAGNOSIS — N2581 Secondary hyperparathyroidism of renal origin: Secondary | ICD-10-CM | POA: Diagnosis not present

## 2014-07-21 DIAGNOSIS — N186 End stage renal disease: Secondary | ICD-10-CM | POA: Diagnosis not present

## 2014-07-21 DIAGNOSIS — D509 Iron deficiency anemia, unspecified: Secondary | ICD-10-CM | POA: Diagnosis not present

## 2014-07-23 DIAGNOSIS — E1129 Type 2 diabetes mellitus with other diabetic kidney complication: Secondary | ICD-10-CM | POA: Diagnosis not present

## 2014-07-23 DIAGNOSIS — D509 Iron deficiency anemia, unspecified: Secondary | ICD-10-CM | POA: Diagnosis not present

## 2014-07-23 DIAGNOSIS — N186 End stage renal disease: Secondary | ICD-10-CM | POA: Diagnosis not present

## 2014-07-23 DIAGNOSIS — N2581 Secondary hyperparathyroidism of renal origin: Secondary | ICD-10-CM | POA: Diagnosis not present

## 2014-07-23 DIAGNOSIS — D631 Anemia in chronic kidney disease: Secondary | ICD-10-CM | POA: Diagnosis not present

## 2014-07-26 DIAGNOSIS — D509 Iron deficiency anemia, unspecified: Secondary | ICD-10-CM | POA: Diagnosis not present

## 2014-07-26 DIAGNOSIS — E1129 Type 2 diabetes mellitus with other diabetic kidney complication: Secondary | ICD-10-CM | POA: Diagnosis not present

## 2014-07-26 DIAGNOSIS — N2581 Secondary hyperparathyroidism of renal origin: Secondary | ICD-10-CM | POA: Diagnosis not present

## 2014-07-26 DIAGNOSIS — N186 End stage renal disease: Secondary | ICD-10-CM | POA: Diagnosis not present

## 2014-07-26 DIAGNOSIS — D631 Anemia in chronic kidney disease: Secondary | ICD-10-CM | POA: Diagnosis not present

## 2014-07-28 DIAGNOSIS — E1129 Type 2 diabetes mellitus with other diabetic kidney complication: Secondary | ICD-10-CM | POA: Diagnosis not present

## 2014-07-28 DIAGNOSIS — N2581 Secondary hyperparathyroidism of renal origin: Secondary | ICD-10-CM | POA: Diagnosis not present

## 2014-07-28 DIAGNOSIS — D509 Iron deficiency anemia, unspecified: Secondary | ICD-10-CM | POA: Diagnosis not present

## 2014-07-28 DIAGNOSIS — N186 End stage renal disease: Secondary | ICD-10-CM | POA: Diagnosis not present

## 2014-07-28 DIAGNOSIS — D631 Anemia in chronic kidney disease: Secondary | ICD-10-CM | POA: Diagnosis not present

## 2014-07-30 DIAGNOSIS — N186 End stage renal disease: Secondary | ICD-10-CM | POA: Diagnosis not present

## 2014-07-30 DIAGNOSIS — D631 Anemia in chronic kidney disease: Secondary | ICD-10-CM | POA: Diagnosis not present

## 2014-07-30 DIAGNOSIS — E1129 Type 2 diabetes mellitus with other diabetic kidney complication: Secondary | ICD-10-CM | POA: Diagnosis not present

## 2014-07-30 DIAGNOSIS — N2581 Secondary hyperparathyroidism of renal origin: Secondary | ICD-10-CM | POA: Diagnosis not present

## 2014-07-30 DIAGNOSIS — D509 Iron deficiency anemia, unspecified: Secondary | ICD-10-CM | POA: Diagnosis not present

## 2014-08-02 DIAGNOSIS — N186 End stage renal disease: Secondary | ICD-10-CM | POA: Diagnosis not present

## 2014-08-02 DIAGNOSIS — N2581 Secondary hyperparathyroidism of renal origin: Secondary | ICD-10-CM | POA: Diagnosis not present

## 2014-08-02 DIAGNOSIS — D631 Anemia in chronic kidney disease: Secondary | ICD-10-CM | POA: Diagnosis not present

## 2014-08-02 DIAGNOSIS — E1129 Type 2 diabetes mellitus with other diabetic kidney complication: Secondary | ICD-10-CM | POA: Diagnosis not present

## 2014-08-02 DIAGNOSIS — D509 Iron deficiency anemia, unspecified: Secondary | ICD-10-CM | POA: Diagnosis not present

## 2014-08-04 DIAGNOSIS — N186 End stage renal disease: Secondary | ICD-10-CM | POA: Diagnosis not present

## 2014-08-04 DIAGNOSIS — N2581 Secondary hyperparathyroidism of renal origin: Secondary | ICD-10-CM | POA: Diagnosis not present

## 2014-08-04 DIAGNOSIS — E1129 Type 2 diabetes mellitus with other diabetic kidney complication: Secondary | ICD-10-CM | POA: Diagnosis not present

## 2014-08-04 DIAGNOSIS — D631 Anemia in chronic kidney disease: Secondary | ICD-10-CM | POA: Diagnosis not present

## 2014-08-04 DIAGNOSIS — D509 Iron deficiency anemia, unspecified: Secondary | ICD-10-CM | POA: Diagnosis not present

## 2014-08-06 DIAGNOSIS — D631 Anemia in chronic kidney disease: Secondary | ICD-10-CM | POA: Diagnosis not present

## 2014-08-06 DIAGNOSIS — E1129 Type 2 diabetes mellitus with other diabetic kidney complication: Secondary | ICD-10-CM | POA: Diagnosis not present

## 2014-08-06 DIAGNOSIS — N2581 Secondary hyperparathyroidism of renal origin: Secondary | ICD-10-CM | POA: Diagnosis not present

## 2014-08-06 DIAGNOSIS — D509 Iron deficiency anemia, unspecified: Secondary | ICD-10-CM | POA: Diagnosis not present

## 2014-08-06 DIAGNOSIS — N186 End stage renal disease: Secondary | ICD-10-CM | POA: Diagnosis not present

## 2014-08-09 DIAGNOSIS — D509 Iron deficiency anemia, unspecified: Secondary | ICD-10-CM | POA: Diagnosis not present

## 2014-08-09 DIAGNOSIS — E1129 Type 2 diabetes mellitus with other diabetic kidney complication: Secondary | ICD-10-CM | POA: Diagnosis not present

## 2014-08-09 DIAGNOSIS — D631 Anemia in chronic kidney disease: Secondary | ICD-10-CM | POA: Diagnosis not present

## 2014-08-09 DIAGNOSIS — N2581 Secondary hyperparathyroidism of renal origin: Secondary | ICD-10-CM | POA: Diagnosis not present

## 2014-08-09 DIAGNOSIS — N186 End stage renal disease: Secondary | ICD-10-CM | POA: Diagnosis not present

## 2014-08-11 DIAGNOSIS — D631 Anemia in chronic kidney disease: Secondary | ICD-10-CM | POA: Diagnosis not present

## 2014-08-11 DIAGNOSIS — N186 End stage renal disease: Secondary | ICD-10-CM | POA: Diagnosis not present

## 2014-08-11 DIAGNOSIS — D509 Iron deficiency anemia, unspecified: Secondary | ICD-10-CM | POA: Diagnosis not present

## 2014-08-11 DIAGNOSIS — E1129 Type 2 diabetes mellitus with other diabetic kidney complication: Secondary | ICD-10-CM | POA: Diagnosis not present

## 2014-08-11 DIAGNOSIS — N2581 Secondary hyperparathyroidism of renal origin: Secondary | ICD-10-CM | POA: Diagnosis not present

## 2014-08-14 DIAGNOSIS — E1129 Type 2 diabetes mellitus with other diabetic kidney complication: Secondary | ICD-10-CM | POA: Diagnosis not present

## 2014-08-14 DIAGNOSIS — N2581 Secondary hyperparathyroidism of renal origin: Secondary | ICD-10-CM | POA: Diagnosis not present

## 2014-08-14 DIAGNOSIS — N186 End stage renal disease: Secondary | ICD-10-CM | POA: Diagnosis not present

## 2014-08-14 DIAGNOSIS — D509 Iron deficiency anemia, unspecified: Secondary | ICD-10-CM | POA: Diagnosis not present

## 2014-08-14 DIAGNOSIS — D631 Anemia in chronic kidney disease: Secondary | ICD-10-CM | POA: Diagnosis not present

## 2014-08-16 DIAGNOSIS — E1129 Type 2 diabetes mellitus with other diabetic kidney complication: Secondary | ICD-10-CM | POA: Diagnosis not present

## 2014-08-16 DIAGNOSIS — D631 Anemia in chronic kidney disease: Secondary | ICD-10-CM | POA: Diagnosis not present

## 2014-08-16 DIAGNOSIS — D509 Iron deficiency anemia, unspecified: Secondary | ICD-10-CM | POA: Diagnosis not present

## 2014-08-16 DIAGNOSIS — N186 End stage renal disease: Secondary | ICD-10-CM | POA: Diagnosis not present

## 2014-08-16 DIAGNOSIS — N2581 Secondary hyperparathyroidism of renal origin: Secondary | ICD-10-CM | POA: Diagnosis not present

## 2014-08-18 DIAGNOSIS — D509 Iron deficiency anemia, unspecified: Secondary | ICD-10-CM | POA: Diagnosis not present

## 2014-08-18 DIAGNOSIS — E1129 Type 2 diabetes mellitus with other diabetic kidney complication: Secondary | ICD-10-CM | POA: Diagnosis not present

## 2014-08-18 DIAGNOSIS — D631 Anemia in chronic kidney disease: Secondary | ICD-10-CM | POA: Diagnosis not present

## 2014-08-18 DIAGNOSIS — N186 End stage renal disease: Secondary | ICD-10-CM | POA: Diagnosis not present

## 2014-08-18 DIAGNOSIS — N2581 Secondary hyperparathyroidism of renal origin: Secondary | ICD-10-CM | POA: Diagnosis not present

## 2014-08-19 DIAGNOSIS — Z992 Dependence on renal dialysis: Secondary | ICD-10-CM | POA: Diagnosis not present

## 2014-08-19 DIAGNOSIS — N186 End stage renal disease: Secondary | ICD-10-CM | POA: Diagnosis not present

## 2014-08-19 DIAGNOSIS — E1122 Type 2 diabetes mellitus with diabetic chronic kidney disease: Secondary | ICD-10-CM | POA: Diagnosis not present

## 2014-08-21 DIAGNOSIS — D631 Anemia in chronic kidney disease: Secondary | ICD-10-CM | POA: Diagnosis not present

## 2014-08-21 DIAGNOSIS — N2581 Secondary hyperparathyroidism of renal origin: Secondary | ICD-10-CM | POA: Diagnosis not present

## 2014-08-21 DIAGNOSIS — E1129 Type 2 diabetes mellitus with other diabetic kidney complication: Secondary | ICD-10-CM | POA: Diagnosis not present

## 2014-08-21 DIAGNOSIS — N186 End stage renal disease: Secondary | ICD-10-CM | POA: Diagnosis not present

## 2014-08-23 DIAGNOSIS — N2581 Secondary hyperparathyroidism of renal origin: Secondary | ICD-10-CM | POA: Diagnosis not present

## 2014-08-23 DIAGNOSIS — N186 End stage renal disease: Secondary | ICD-10-CM | POA: Diagnosis not present

## 2014-08-23 DIAGNOSIS — D631 Anemia in chronic kidney disease: Secondary | ICD-10-CM | POA: Diagnosis not present

## 2014-08-23 DIAGNOSIS — E1129 Type 2 diabetes mellitus with other diabetic kidney complication: Secondary | ICD-10-CM | POA: Diagnosis not present

## 2014-08-25 DIAGNOSIS — N186 End stage renal disease: Secondary | ICD-10-CM | POA: Diagnosis not present

## 2014-08-25 DIAGNOSIS — D631 Anemia in chronic kidney disease: Secondary | ICD-10-CM | POA: Diagnosis not present

## 2014-08-25 DIAGNOSIS — E1129 Type 2 diabetes mellitus with other diabetic kidney complication: Secondary | ICD-10-CM | POA: Diagnosis not present

## 2014-08-25 DIAGNOSIS — N2581 Secondary hyperparathyroidism of renal origin: Secondary | ICD-10-CM | POA: Diagnosis not present

## 2014-08-27 DIAGNOSIS — E1129 Type 2 diabetes mellitus with other diabetic kidney complication: Secondary | ICD-10-CM | POA: Diagnosis not present

## 2014-08-27 DIAGNOSIS — D631 Anemia in chronic kidney disease: Secondary | ICD-10-CM | POA: Diagnosis not present

## 2014-08-27 DIAGNOSIS — N2581 Secondary hyperparathyroidism of renal origin: Secondary | ICD-10-CM | POA: Diagnosis not present

## 2014-08-27 DIAGNOSIS — N186 End stage renal disease: Secondary | ICD-10-CM | POA: Diagnosis not present

## 2014-08-30 DIAGNOSIS — D631 Anemia in chronic kidney disease: Secondary | ICD-10-CM | POA: Diagnosis not present

## 2014-08-30 DIAGNOSIS — N2581 Secondary hyperparathyroidism of renal origin: Secondary | ICD-10-CM | POA: Diagnosis not present

## 2014-08-30 DIAGNOSIS — E1129 Type 2 diabetes mellitus with other diabetic kidney complication: Secondary | ICD-10-CM | POA: Diagnosis not present

## 2014-08-30 DIAGNOSIS — N186 End stage renal disease: Secondary | ICD-10-CM | POA: Diagnosis not present

## 2014-09-01 DIAGNOSIS — N186 End stage renal disease: Secondary | ICD-10-CM | POA: Diagnosis not present

## 2014-09-01 DIAGNOSIS — D631 Anemia in chronic kidney disease: Secondary | ICD-10-CM | POA: Diagnosis not present

## 2014-09-01 DIAGNOSIS — E1129 Type 2 diabetes mellitus with other diabetic kidney complication: Secondary | ICD-10-CM | POA: Diagnosis not present

## 2014-09-01 DIAGNOSIS — N2581 Secondary hyperparathyroidism of renal origin: Secondary | ICD-10-CM | POA: Diagnosis not present

## 2014-09-03 DIAGNOSIS — D631 Anemia in chronic kidney disease: Secondary | ICD-10-CM | POA: Diagnosis not present

## 2014-09-03 DIAGNOSIS — N186 End stage renal disease: Secondary | ICD-10-CM | POA: Diagnosis not present

## 2014-09-03 DIAGNOSIS — E1129 Type 2 diabetes mellitus with other diabetic kidney complication: Secondary | ICD-10-CM | POA: Diagnosis not present

## 2014-09-03 DIAGNOSIS — N2581 Secondary hyperparathyroidism of renal origin: Secondary | ICD-10-CM | POA: Diagnosis not present

## 2014-09-06 DIAGNOSIS — N186 End stage renal disease: Secondary | ICD-10-CM | POA: Diagnosis not present

## 2014-09-06 DIAGNOSIS — D631 Anemia in chronic kidney disease: Secondary | ICD-10-CM | POA: Diagnosis not present

## 2014-09-06 DIAGNOSIS — N2581 Secondary hyperparathyroidism of renal origin: Secondary | ICD-10-CM | POA: Diagnosis not present

## 2014-09-06 DIAGNOSIS — E1129 Type 2 diabetes mellitus with other diabetic kidney complication: Secondary | ICD-10-CM | POA: Diagnosis not present

## 2014-09-08 DIAGNOSIS — N186 End stage renal disease: Secondary | ICD-10-CM | POA: Diagnosis not present

## 2014-09-08 DIAGNOSIS — N2581 Secondary hyperparathyroidism of renal origin: Secondary | ICD-10-CM | POA: Diagnosis not present

## 2014-09-08 DIAGNOSIS — E1129 Type 2 diabetes mellitus with other diabetic kidney complication: Secondary | ICD-10-CM | POA: Diagnosis not present

## 2014-09-08 DIAGNOSIS — D631 Anemia in chronic kidney disease: Secondary | ICD-10-CM | POA: Diagnosis not present

## 2014-09-10 DIAGNOSIS — E1129 Type 2 diabetes mellitus with other diabetic kidney complication: Secondary | ICD-10-CM | POA: Diagnosis not present

## 2014-09-10 DIAGNOSIS — N186 End stage renal disease: Secondary | ICD-10-CM | POA: Diagnosis not present

## 2014-09-10 DIAGNOSIS — D631 Anemia in chronic kidney disease: Secondary | ICD-10-CM | POA: Diagnosis not present

## 2014-09-10 DIAGNOSIS — N2581 Secondary hyperparathyroidism of renal origin: Secondary | ICD-10-CM | POA: Diagnosis not present

## 2014-09-13 DIAGNOSIS — N186 End stage renal disease: Secondary | ICD-10-CM | POA: Diagnosis not present

## 2014-09-13 DIAGNOSIS — D631 Anemia in chronic kidney disease: Secondary | ICD-10-CM | POA: Diagnosis not present

## 2014-09-13 DIAGNOSIS — N2581 Secondary hyperparathyroidism of renal origin: Secondary | ICD-10-CM | POA: Diagnosis not present

## 2014-09-13 DIAGNOSIS — E1129 Type 2 diabetes mellitus with other diabetic kidney complication: Secondary | ICD-10-CM | POA: Diagnosis not present

## 2014-09-15 DIAGNOSIS — N186 End stage renal disease: Secondary | ICD-10-CM | POA: Diagnosis not present

## 2014-09-15 DIAGNOSIS — N2581 Secondary hyperparathyroidism of renal origin: Secondary | ICD-10-CM | POA: Diagnosis not present

## 2014-09-15 DIAGNOSIS — D631 Anemia in chronic kidney disease: Secondary | ICD-10-CM | POA: Diagnosis not present

## 2014-09-15 DIAGNOSIS — E1129 Type 2 diabetes mellitus with other diabetic kidney complication: Secondary | ICD-10-CM | POA: Diagnosis not present

## 2014-09-17 DIAGNOSIS — N2581 Secondary hyperparathyroidism of renal origin: Secondary | ICD-10-CM | POA: Diagnosis not present

## 2014-09-17 DIAGNOSIS — E1129 Type 2 diabetes mellitus with other diabetic kidney complication: Secondary | ICD-10-CM | POA: Diagnosis not present

## 2014-09-17 DIAGNOSIS — D631 Anemia in chronic kidney disease: Secondary | ICD-10-CM | POA: Diagnosis not present

## 2014-09-17 DIAGNOSIS — N186 End stage renal disease: Secondary | ICD-10-CM | POA: Diagnosis not present

## 2014-09-19 DIAGNOSIS — N186 End stage renal disease: Secondary | ICD-10-CM | POA: Diagnosis not present

## 2014-09-19 DIAGNOSIS — Z992 Dependence on renal dialysis: Secondary | ICD-10-CM | POA: Diagnosis not present

## 2014-09-19 DIAGNOSIS — E1122 Type 2 diabetes mellitus with diabetic chronic kidney disease: Secondary | ICD-10-CM | POA: Diagnosis not present

## 2014-09-20 DIAGNOSIS — D631 Anemia in chronic kidney disease: Secondary | ICD-10-CM | POA: Diagnosis not present

## 2014-09-20 DIAGNOSIS — N186 End stage renal disease: Secondary | ICD-10-CM | POA: Diagnosis not present

## 2014-09-20 DIAGNOSIS — E1129 Type 2 diabetes mellitus with other diabetic kidney complication: Secondary | ICD-10-CM | POA: Diagnosis not present

## 2014-09-20 DIAGNOSIS — N2581 Secondary hyperparathyroidism of renal origin: Secondary | ICD-10-CM | POA: Diagnosis not present

## 2014-09-22 DIAGNOSIS — E1129 Type 2 diabetes mellitus with other diabetic kidney complication: Secondary | ICD-10-CM | POA: Diagnosis not present

## 2014-09-22 DIAGNOSIS — D631 Anemia in chronic kidney disease: Secondary | ICD-10-CM | POA: Diagnosis not present

## 2014-09-22 DIAGNOSIS — N2581 Secondary hyperparathyroidism of renal origin: Secondary | ICD-10-CM | POA: Diagnosis not present

## 2014-09-22 DIAGNOSIS — N186 End stage renal disease: Secondary | ICD-10-CM | POA: Diagnosis not present

## 2014-09-24 DIAGNOSIS — E1129 Type 2 diabetes mellitus with other diabetic kidney complication: Secondary | ICD-10-CM | POA: Diagnosis not present

## 2014-09-24 DIAGNOSIS — D631 Anemia in chronic kidney disease: Secondary | ICD-10-CM | POA: Diagnosis not present

## 2014-09-24 DIAGNOSIS — N2581 Secondary hyperparathyroidism of renal origin: Secondary | ICD-10-CM | POA: Diagnosis not present

## 2014-09-24 DIAGNOSIS — N186 End stage renal disease: Secondary | ICD-10-CM | POA: Diagnosis not present

## 2014-09-27 DIAGNOSIS — N186 End stage renal disease: Secondary | ICD-10-CM | POA: Diagnosis not present

## 2014-09-27 DIAGNOSIS — D631 Anemia in chronic kidney disease: Secondary | ICD-10-CM | POA: Diagnosis not present

## 2014-09-27 DIAGNOSIS — E1129 Type 2 diabetes mellitus with other diabetic kidney complication: Secondary | ICD-10-CM | POA: Diagnosis not present

## 2014-09-27 DIAGNOSIS — N2581 Secondary hyperparathyroidism of renal origin: Secondary | ICD-10-CM | POA: Diagnosis not present

## 2014-09-29 DIAGNOSIS — D631 Anemia in chronic kidney disease: Secondary | ICD-10-CM | POA: Diagnosis not present

## 2014-09-29 DIAGNOSIS — E1129 Type 2 diabetes mellitus with other diabetic kidney complication: Secondary | ICD-10-CM | POA: Diagnosis not present

## 2014-09-29 DIAGNOSIS — N2581 Secondary hyperparathyroidism of renal origin: Secondary | ICD-10-CM | POA: Diagnosis not present

## 2014-09-29 DIAGNOSIS — N186 End stage renal disease: Secondary | ICD-10-CM | POA: Diagnosis not present

## 2014-10-01 DIAGNOSIS — D631 Anemia in chronic kidney disease: Secondary | ICD-10-CM | POA: Diagnosis not present

## 2014-10-01 DIAGNOSIS — N186 End stage renal disease: Secondary | ICD-10-CM | POA: Diagnosis not present

## 2014-10-01 DIAGNOSIS — E1129 Type 2 diabetes mellitus with other diabetic kidney complication: Secondary | ICD-10-CM | POA: Diagnosis not present

## 2014-10-01 DIAGNOSIS — N2581 Secondary hyperparathyroidism of renal origin: Secondary | ICD-10-CM | POA: Diagnosis not present

## 2014-10-04 DIAGNOSIS — D631 Anemia in chronic kidney disease: Secondary | ICD-10-CM | POA: Diagnosis not present

## 2014-10-04 DIAGNOSIS — E1129 Type 2 diabetes mellitus with other diabetic kidney complication: Secondary | ICD-10-CM | POA: Diagnosis not present

## 2014-10-04 DIAGNOSIS — N2581 Secondary hyperparathyroidism of renal origin: Secondary | ICD-10-CM | POA: Diagnosis not present

## 2014-10-04 DIAGNOSIS — N186 End stage renal disease: Secondary | ICD-10-CM | POA: Diagnosis not present

## 2014-10-06 DIAGNOSIS — E1129 Type 2 diabetes mellitus with other diabetic kidney complication: Secondary | ICD-10-CM | POA: Diagnosis not present

## 2014-10-06 DIAGNOSIS — N186 End stage renal disease: Secondary | ICD-10-CM | POA: Diagnosis not present

## 2014-10-06 DIAGNOSIS — D631 Anemia in chronic kidney disease: Secondary | ICD-10-CM | POA: Diagnosis not present

## 2014-10-06 DIAGNOSIS — N2581 Secondary hyperparathyroidism of renal origin: Secondary | ICD-10-CM | POA: Diagnosis not present

## 2014-10-08 DIAGNOSIS — N186 End stage renal disease: Secondary | ICD-10-CM | POA: Diagnosis not present

## 2014-10-08 DIAGNOSIS — D631 Anemia in chronic kidney disease: Secondary | ICD-10-CM | POA: Diagnosis not present

## 2014-10-08 DIAGNOSIS — E1129 Type 2 diabetes mellitus with other diabetic kidney complication: Secondary | ICD-10-CM | POA: Diagnosis not present

## 2014-10-08 DIAGNOSIS — N2581 Secondary hyperparathyroidism of renal origin: Secondary | ICD-10-CM | POA: Diagnosis not present

## 2014-10-11 DIAGNOSIS — D631 Anemia in chronic kidney disease: Secondary | ICD-10-CM | POA: Diagnosis not present

## 2014-10-11 DIAGNOSIS — N2581 Secondary hyperparathyroidism of renal origin: Secondary | ICD-10-CM | POA: Diagnosis not present

## 2014-10-11 DIAGNOSIS — N186 End stage renal disease: Secondary | ICD-10-CM | POA: Diagnosis not present

## 2014-10-11 DIAGNOSIS — E1129 Type 2 diabetes mellitus with other diabetic kidney complication: Secondary | ICD-10-CM | POA: Diagnosis not present

## 2014-10-13 DIAGNOSIS — N2581 Secondary hyperparathyroidism of renal origin: Secondary | ICD-10-CM | POA: Diagnosis not present

## 2014-10-13 DIAGNOSIS — D631 Anemia in chronic kidney disease: Secondary | ICD-10-CM | POA: Diagnosis not present

## 2014-10-13 DIAGNOSIS — N186 End stage renal disease: Secondary | ICD-10-CM | POA: Diagnosis not present

## 2014-10-13 DIAGNOSIS — E1129 Type 2 diabetes mellitus with other diabetic kidney complication: Secondary | ICD-10-CM | POA: Diagnosis not present

## 2014-10-15 DIAGNOSIS — D631 Anemia in chronic kidney disease: Secondary | ICD-10-CM | POA: Diagnosis not present

## 2014-10-15 DIAGNOSIS — N186 End stage renal disease: Secondary | ICD-10-CM | POA: Diagnosis not present

## 2014-10-15 DIAGNOSIS — E1129 Type 2 diabetes mellitus with other diabetic kidney complication: Secondary | ICD-10-CM | POA: Diagnosis not present

## 2014-10-15 DIAGNOSIS — N2581 Secondary hyperparathyroidism of renal origin: Secondary | ICD-10-CM | POA: Diagnosis not present

## 2014-10-18 DIAGNOSIS — N2581 Secondary hyperparathyroidism of renal origin: Secondary | ICD-10-CM | POA: Diagnosis not present

## 2014-10-18 DIAGNOSIS — N186 End stage renal disease: Secondary | ICD-10-CM | POA: Diagnosis not present

## 2014-10-18 DIAGNOSIS — E1122 Type 2 diabetes mellitus with diabetic chronic kidney disease: Secondary | ICD-10-CM | POA: Diagnosis not present

## 2014-10-18 DIAGNOSIS — Z992 Dependence on renal dialysis: Secondary | ICD-10-CM | POA: Diagnosis not present

## 2014-10-18 DIAGNOSIS — E1129 Type 2 diabetes mellitus with other diabetic kidney complication: Secondary | ICD-10-CM | POA: Diagnosis not present

## 2014-10-18 DIAGNOSIS — D631 Anemia in chronic kidney disease: Secondary | ICD-10-CM | POA: Diagnosis not present

## 2014-10-20 DIAGNOSIS — E1129 Type 2 diabetes mellitus with other diabetic kidney complication: Secondary | ICD-10-CM | POA: Diagnosis not present

## 2014-10-20 DIAGNOSIS — D631 Anemia in chronic kidney disease: Secondary | ICD-10-CM | POA: Diagnosis not present

## 2014-10-20 DIAGNOSIS — N2581 Secondary hyperparathyroidism of renal origin: Secondary | ICD-10-CM | POA: Diagnosis not present

## 2014-10-20 DIAGNOSIS — N186 End stage renal disease: Secondary | ICD-10-CM | POA: Diagnosis not present

## 2014-10-22 DIAGNOSIS — D631 Anemia in chronic kidney disease: Secondary | ICD-10-CM | POA: Diagnosis not present

## 2014-10-22 DIAGNOSIS — N186 End stage renal disease: Secondary | ICD-10-CM | POA: Diagnosis not present

## 2014-10-22 DIAGNOSIS — N2581 Secondary hyperparathyroidism of renal origin: Secondary | ICD-10-CM | POA: Diagnosis not present

## 2014-10-22 DIAGNOSIS — E1129 Type 2 diabetes mellitus with other diabetic kidney complication: Secondary | ICD-10-CM | POA: Diagnosis not present

## 2014-10-25 DIAGNOSIS — D631 Anemia in chronic kidney disease: Secondary | ICD-10-CM | POA: Diagnosis not present

## 2014-10-25 DIAGNOSIS — E1129 Type 2 diabetes mellitus with other diabetic kidney complication: Secondary | ICD-10-CM | POA: Diagnosis not present

## 2014-10-25 DIAGNOSIS — N186 End stage renal disease: Secondary | ICD-10-CM | POA: Diagnosis not present

## 2014-10-25 DIAGNOSIS — N2581 Secondary hyperparathyroidism of renal origin: Secondary | ICD-10-CM | POA: Diagnosis not present

## 2014-10-27 DIAGNOSIS — D631 Anemia in chronic kidney disease: Secondary | ICD-10-CM | POA: Diagnosis not present

## 2014-10-27 DIAGNOSIS — N186 End stage renal disease: Secondary | ICD-10-CM | POA: Diagnosis not present

## 2014-10-27 DIAGNOSIS — N2581 Secondary hyperparathyroidism of renal origin: Secondary | ICD-10-CM | POA: Diagnosis not present

## 2014-10-27 DIAGNOSIS — E1129 Type 2 diabetes mellitus with other diabetic kidney complication: Secondary | ICD-10-CM | POA: Diagnosis not present

## 2014-10-29 DIAGNOSIS — N2581 Secondary hyperparathyroidism of renal origin: Secondary | ICD-10-CM | POA: Diagnosis not present

## 2014-10-29 DIAGNOSIS — N186 End stage renal disease: Secondary | ICD-10-CM | POA: Diagnosis not present

## 2014-10-29 DIAGNOSIS — D631 Anemia in chronic kidney disease: Secondary | ICD-10-CM | POA: Diagnosis not present

## 2014-10-29 DIAGNOSIS — E1129 Type 2 diabetes mellitus with other diabetic kidney complication: Secondary | ICD-10-CM | POA: Diagnosis not present

## 2014-11-01 DIAGNOSIS — N186 End stage renal disease: Secondary | ICD-10-CM | POA: Diagnosis not present

## 2014-11-01 DIAGNOSIS — D631 Anemia in chronic kidney disease: Secondary | ICD-10-CM | POA: Diagnosis not present

## 2014-11-01 DIAGNOSIS — N2581 Secondary hyperparathyroidism of renal origin: Secondary | ICD-10-CM | POA: Diagnosis not present

## 2014-11-01 DIAGNOSIS — E1129 Type 2 diabetes mellitus with other diabetic kidney complication: Secondary | ICD-10-CM | POA: Diagnosis not present

## 2014-11-03 DIAGNOSIS — N2581 Secondary hyperparathyroidism of renal origin: Secondary | ICD-10-CM | POA: Diagnosis not present

## 2014-11-03 DIAGNOSIS — D631 Anemia in chronic kidney disease: Secondary | ICD-10-CM | POA: Diagnosis not present

## 2014-11-03 DIAGNOSIS — E1129 Type 2 diabetes mellitus with other diabetic kidney complication: Secondary | ICD-10-CM | POA: Diagnosis not present

## 2014-11-03 DIAGNOSIS — N186 End stage renal disease: Secondary | ICD-10-CM | POA: Diagnosis not present

## 2014-11-05 DIAGNOSIS — N186 End stage renal disease: Secondary | ICD-10-CM | POA: Diagnosis not present

## 2014-11-05 DIAGNOSIS — E1129 Type 2 diabetes mellitus with other diabetic kidney complication: Secondary | ICD-10-CM | POA: Diagnosis not present

## 2014-11-05 DIAGNOSIS — N2581 Secondary hyperparathyroidism of renal origin: Secondary | ICD-10-CM | POA: Diagnosis not present

## 2014-11-05 DIAGNOSIS — D631 Anemia in chronic kidney disease: Secondary | ICD-10-CM | POA: Diagnosis not present

## 2014-11-08 DIAGNOSIS — E1129 Type 2 diabetes mellitus with other diabetic kidney complication: Secondary | ICD-10-CM | POA: Diagnosis not present

## 2014-11-08 DIAGNOSIS — N2581 Secondary hyperparathyroidism of renal origin: Secondary | ICD-10-CM | POA: Diagnosis not present

## 2014-11-08 DIAGNOSIS — D631 Anemia in chronic kidney disease: Secondary | ICD-10-CM | POA: Diagnosis not present

## 2014-11-08 DIAGNOSIS — N186 End stage renal disease: Secondary | ICD-10-CM | POA: Diagnosis not present

## 2014-11-10 DIAGNOSIS — D631 Anemia in chronic kidney disease: Secondary | ICD-10-CM | POA: Diagnosis not present

## 2014-11-10 DIAGNOSIS — N2581 Secondary hyperparathyroidism of renal origin: Secondary | ICD-10-CM | POA: Diagnosis not present

## 2014-11-10 DIAGNOSIS — E1129 Type 2 diabetes mellitus with other diabetic kidney complication: Secondary | ICD-10-CM | POA: Diagnosis not present

## 2014-11-10 DIAGNOSIS — N186 End stage renal disease: Secondary | ICD-10-CM | POA: Diagnosis not present

## 2014-11-12 DIAGNOSIS — N2581 Secondary hyperparathyroidism of renal origin: Secondary | ICD-10-CM | POA: Diagnosis not present

## 2014-11-12 DIAGNOSIS — N186 End stage renal disease: Secondary | ICD-10-CM | POA: Diagnosis not present

## 2014-11-12 DIAGNOSIS — E1129 Type 2 diabetes mellitus with other diabetic kidney complication: Secondary | ICD-10-CM | POA: Diagnosis not present

## 2014-11-12 DIAGNOSIS — D631 Anemia in chronic kidney disease: Secondary | ICD-10-CM | POA: Diagnosis not present

## 2014-11-15 DIAGNOSIS — D631 Anemia in chronic kidney disease: Secondary | ICD-10-CM | POA: Diagnosis not present

## 2014-11-15 DIAGNOSIS — N2581 Secondary hyperparathyroidism of renal origin: Secondary | ICD-10-CM | POA: Diagnosis not present

## 2014-11-15 DIAGNOSIS — N186 End stage renal disease: Secondary | ICD-10-CM | POA: Diagnosis not present

## 2014-11-15 DIAGNOSIS — E1129 Type 2 diabetes mellitus with other diabetic kidney complication: Secondary | ICD-10-CM | POA: Diagnosis not present

## 2014-11-17 DIAGNOSIS — E1129 Type 2 diabetes mellitus with other diabetic kidney complication: Secondary | ICD-10-CM | POA: Diagnosis not present

## 2014-11-17 DIAGNOSIS — N186 End stage renal disease: Secondary | ICD-10-CM | POA: Diagnosis not present

## 2014-11-17 DIAGNOSIS — D631 Anemia in chronic kidney disease: Secondary | ICD-10-CM | POA: Diagnosis not present

## 2014-11-17 DIAGNOSIS — N2581 Secondary hyperparathyroidism of renal origin: Secondary | ICD-10-CM | POA: Diagnosis not present

## 2014-11-18 DIAGNOSIS — E1122 Type 2 diabetes mellitus with diabetic chronic kidney disease: Secondary | ICD-10-CM | POA: Diagnosis not present

## 2014-11-18 DIAGNOSIS — Z992 Dependence on renal dialysis: Secondary | ICD-10-CM | POA: Diagnosis not present

## 2014-11-18 DIAGNOSIS — N186 End stage renal disease: Secondary | ICD-10-CM | POA: Diagnosis not present

## 2014-11-19 DIAGNOSIS — N2581 Secondary hyperparathyroidism of renal origin: Secondary | ICD-10-CM | POA: Diagnosis not present

## 2014-11-19 DIAGNOSIS — D508 Other iron deficiency anemias: Secondary | ICD-10-CM | POA: Diagnosis not present

## 2014-11-19 DIAGNOSIS — N186 End stage renal disease: Secondary | ICD-10-CM | POA: Diagnosis not present

## 2014-11-19 DIAGNOSIS — D631 Anemia in chronic kidney disease: Secondary | ICD-10-CM | POA: Diagnosis not present

## 2014-11-19 DIAGNOSIS — E1129 Type 2 diabetes mellitus with other diabetic kidney complication: Secondary | ICD-10-CM | POA: Diagnosis not present

## 2014-11-22 DIAGNOSIS — N2581 Secondary hyperparathyroidism of renal origin: Secondary | ICD-10-CM | POA: Diagnosis not present

## 2014-11-22 DIAGNOSIS — E1129 Type 2 diabetes mellitus with other diabetic kidney complication: Secondary | ICD-10-CM | POA: Diagnosis not present

## 2014-11-22 DIAGNOSIS — N186 End stage renal disease: Secondary | ICD-10-CM | POA: Diagnosis not present

## 2014-11-22 DIAGNOSIS — D508 Other iron deficiency anemias: Secondary | ICD-10-CM | POA: Diagnosis not present

## 2014-11-22 DIAGNOSIS — D631 Anemia in chronic kidney disease: Secondary | ICD-10-CM | POA: Diagnosis not present

## 2014-11-24 DIAGNOSIS — D631 Anemia in chronic kidney disease: Secondary | ICD-10-CM | POA: Diagnosis not present

## 2014-11-24 DIAGNOSIS — N2581 Secondary hyperparathyroidism of renal origin: Secondary | ICD-10-CM | POA: Diagnosis not present

## 2014-11-24 DIAGNOSIS — N186 End stage renal disease: Secondary | ICD-10-CM | POA: Diagnosis not present

## 2014-11-24 DIAGNOSIS — E1129 Type 2 diabetes mellitus with other diabetic kidney complication: Secondary | ICD-10-CM | POA: Diagnosis not present

## 2014-11-24 DIAGNOSIS — D508 Other iron deficiency anemias: Secondary | ICD-10-CM | POA: Diagnosis not present

## 2014-11-26 DIAGNOSIS — D631 Anemia in chronic kidney disease: Secondary | ICD-10-CM | POA: Diagnosis not present

## 2014-11-26 DIAGNOSIS — D508 Other iron deficiency anemias: Secondary | ICD-10-CM | POA: Diagnosis not present

## 2014-11-26 DIAGNOSIS — N2581 Secondary hyperparathyroidism of renal origin: Secondary | ICD-10-CM | POA: Diagnosis not present

## 2014-11-26 DIAGNOSIS — N186 End stage renal disease: Secondary | ICD-10-CM | POA: Diagnosis not present

## 2014-11-26 DIAGNOSIS — E1129 Type 2 diabetes mellitus with other diabetic kidney complication: Secondary | ICD-10-CM | POA: Diagnosis not present

## 2014-11-29 DIAGNOSIS — D631 Anemia in chronic kidney disease: Secondary | ICD-10-CM | POA: Diagnosis not present

## 2014-11-29 DIAGNOSIS — N186 End stage renal disease: Secondary | ICD-10-CM | POA: Diagnosis not present

## 2014-11-29 DIAGNOSIS — E1129 Type 2 diabetes mellitus with other diabetic kidney complication: Secondary | ICD-10-CM | POA: Diagnosis not present

## 2014-11-29 DIAGNOSIS — D508 Other iron deficiency anemias: Secondary | ICD-10-CM | POA: Diagnosis not present

## 2014-11-29 DIAGNOSIS — N2581 Secondary hyperparathyroidism of renal origin: Secondary | ICD-10-CM | POA: Diagnosis not present

## 2014-12-01 DIAGNOSIS — N186 End stage renal disease: Secondary | ICD-10-CM | POA: Diagnosis not present

## 2014-12-01 DIAGNOSIS — N2581 Secondary hyperparathyroidism of renal origin: Secondary | ICD-10-CM | POA: Diagnosis not present

## 2014-12-01 DIAGNOSIS — D508 Other iron deficiency anemias: Secondary | ICD-10-CM | POA: Diagnosis not present

## 2014-12-01 DIAGNOSIS — D631 Anemia in chronic kidney disease: Secondary | ICD-10-CM | POA: Diagnosis not present

## 2014-12-01 DIAGNOSIS — E1129 Type 2 diabetes mellitus with other diabetic kidney complication: Secondary | ICD-10-CM | POA: Diagnosis not present

## 2014-12-03 DIAGNOSIS — E1129 Type 2 diabetes mellitus with other diabetic kidney complication: Secondary | ICD-10-CM | POA: Diagnosis not present

## 2014-12-03 DIAGNOSIS — N2581 Secondary hyperparathyroidism of renal origin: Secondary | ICD-10-CM | POA: Diagnosis not present

## 2014-12-03 DIAGNOSIS — D631 Anemia in chronic kidney disease: Secondary | ICD-10-CM | POA: Diagnosis not present

## 2014-12-03 DIAGNOSIS — D508 Other iron deficiency anemias: Secondary | ICD-10-CM | POA: Diagnosis not present

## 2014-12-03 DIAGNOSIS — N186 End stage renal disease: Secondary | ICD-10-CM | POA: Diagnosis not present

## 2014-12-06 DIAGNOSIS — E1129 Type 2 diabetes mellitus with other diabetic kidney complication: Secondary | ICD-10-CM | POA: Diagnosis not present

## 2014-12-06 DIAGNOSIS — N186 End stage renal disease: Secondary | ICD-10-CM | POA: Diagnosis not present

## 2014-12-06 DIAGNOSIS — N2581 Secondary hyperparathyroidism of renal origin: Secondary | ICD-10-CM | POA: Diagnosis not present

## 2014-12-06 DIAGNOSIS — D508 Other iron deficiency anemias: Secondary | ICD-10-CM | POA: Diagnosis not present

## 2014-12-06 DIAGNOSIS — D631 Anemia in chronic kidney disease: Secondary | ICD-10-CM | POA: Diagnosis not present

## 2014-12-08 DIAGNOSIS — E1129 Type 2 diabetes mellitus with other diabetic kidney complication: Secondary | ICD-10-CM | POA: Diagnosis not present

## 2014-12-08 DIAGNOSIS — D508 Other iron deficiency anemias: Secondary | ICD-10-CM | POA: Diagnosis not present

## 2014-12-08 DIAGNOSIS — D631 Anemia in chronic kidney disease: Secondary | ICD-10-CM | POA: Diagnosis not present

## 2014-12-08 DIAGNOSIS — N186 End stage renal disease: Secondary | ICD-10-CM | POA: Diagnosis not present

## 2014-12-08 DIAGNOSIS — N2581 Secondary hyperparathyroidism of renal origin: Secondary | ICD-10-CM | POA: Diagnosis not present

## 2014-12-10 DIAGNOSIS — N186 End stage renal disease: Secondary | ICD-10-CM | POA: Diagnosis not present

## 2014-12-10 DIAGNOSIS — E1129 Type 2 diabetes mellitus with other diabetic kidney complication: Secondary | ICD-10-CM | POA: Diagnosis not present

## 2014-12-10 DIAGNOSIS — D631 Anemia in chronic kidney disease: Secondary | ICD-10-CM | POA: Diagnosis not present

## 2014-12-10 DIAGNOSIS — D508 Other iron deficiency anemias: Secondary | ICD-10-CM | POA: Diagnosis not present

## 2014-12-10 DIAGNOSIS — N2581 Secondary hyperparathyroidism of renal origin: Secondary | ICD-10-CM | POA: Diagnosis not present

## 2014-12-10 NOTE — H&P (Signed)
PATIENT NAME:  Luis Walter, Luis Walter MR#:  [id number] DATE OF BIRTH:  08/17/1951  DATE OF ADMISSION:  05/30/2013  PRIMARY CARE PHYSICIAN: Richard L. Rosanna Randy, MD  CHIEF COMPLAINT: Drooling and left-sided facial droop.   HISTORY OF PRESENT ILLNESS: This is a 64 year old male who presents to the hospital, brought in by his wife due to drooling and left-sided facial droop that began early this morning when he woke up from sleep. The patient has a history of previous CVA, and his symptoms are very similar to his previous stroke he had about 5 years ago. Therefore, the wife was concerned and brought him to the ER for further evaluation. By the time he presented to the Emergency Room, the patient's symptoms have pretty much resolved. The patient denied any headache, any nausea, vomiting, any dizziness or any numbness, tingling or any other focal weakness in his extremities. He denied any seizure-type activity or any incontinence. Hospitalist services were contacted for further treatment and evaluation.   REVIEW OF SYSTEMS:  CONSTITUTIONAL: No documented fever. No weight gain, no weight loss.  EYES: No blurred or double vision.  ENT: No tinnitus. No postnasal drip. No redness of the oropharynx.  RESPIRATORY: No cough, no wheeze, no hemoptysis, no dyspnea.  CARDIOVASCULAR: No chest pain, no orthopnea, no palpitations, no syncope.  GASTROINTESTINAL: No nausea, no vomiting, no diarrhea. No abdominal pain. No melena or hematochezia.  GENITOURINARY: No dysuria or hematuria.  ENDOCRINE: No polyuria or nocturia, heat or intolerance.  HEMATOLOGIC: No anemia, no bruising, no bleeding.  INTEGUMENTARY: No rashes. No lesions.  MUSCULOSKELETAL: No arthritis, no swelling, no gout.  NEUROLOGIC: No numbness or tingling. No ataxia. No seizure-type activity. Positive TIA.  PSYCHIATRIC: No anxiety. No insomnia. No ADD.   PAST MEDICAL HISTORY: Consistent with diabetes, end-stage renal disease, on hemodialysis Monday,  Wednesday, Friday, history of previous CVA.   ALLERGIES: No known drug allergies.   SOCIAL HISTORY: No smoking. No alcohol abuse. No illicit drug abuse. Lives at home with his wife.   FAMILY HISTORY: Father is deceased, had diabetes. Mother is alive and healthy. Diabetes does seem to run in the family.   CURRENT MEDICATIONS: Aspirin 81 mg daily, Glucotrol XL 5 mg daily and Renagel 800 mg at bedtime.   PHYSICAL EXAMINATION: Presently, is as follows: VITAL SIGNS: Noted to be: Temperature is 98.2, pulse 95, respirations 18, blood pressure 135/69, saturation is 94% on room air.  GENERAL: The patient is a pleasant-appearing male in no apparent distress.  HEAD, EYES, EARS, NOSE AND THROAT: The patient is atraumatic, normocephalic. Extraocular muscles are intact. Pupils equally reactive to light. Sclerae are anicteric. No conjunctival injection. No pharyngeal erythema.  NECK: Supple. There is no jugular venous distention. No bruits, no lymphadenopathy or thyromegaly.  HEART: Regular rate and rhythm. No murmurs. No rubs. No clicks.  LUNGS: Clear to auscultation bilaterally. No rales, no rhonchi, no wheezes.  ABDOMEN: Soft, flat, nontender, nondistended. Has good bowel sounds. No hepatosplenomegaly appreciated.  EXTREMITIES: No evidence of any cyanosis, clubbing or peripheral edema. Has +2 pedal and radial pulses bilaterally.  NEUROLOGICAL: The patient is alert, awake and oriented x3. No other focal motor or sensory deficits appreciated bilaterally.  SKIN: Moist and warm with no rashes appreciated.  LYMPHATIC: There is no cervical or axillary lymphadenopathy.   LABORATORY DATA: Showed a serum glucose of 143, BUN 29, creatinine 9.2, sodium 131, potassium 5.5, chloride 99, bicarbonate 25. The patient's LFTs are within normal limits. Troponin less than 0.02. White cell count 7.2,  hemoglobin 12.7, hematocrit 37.3, platelet count of 217. INR is 1.0. Urinalysis showed 3+ leukocyte esterase with 242 white  cells with 2+ bacteria.   ASSESSMENT AND PLAN: This is a 64 year old male with a history of diabetes, end-stage renal disease, on hemodialysis Monday, Wednesday, Friday, history of previous cerebrovascular accident, who presented to the hospital due to left-sided facial droop and drooling.   1. Cerebrovascular accident/transient ischemic attack. This was likely the cause of the patient's left-sided facial droop and drooling. The patient's symptoms began early this morning and now have resolved. The patient does have risk factors given his diabetes, vascular disease and previous history of cerebrovascular accident. For now, I will continue the patient on aspirin. Check a lipid profile. Follow q.4 hour neuro checks. Will get a carotid duplex and an MRI of the brain. The patient's CT head is essentially negative.  2. Diabetes. Continue the patient's Glucotrol and a carb-controlled diet. Follow blood sugars. 3. End-stage renal disease, on hemodialysis Monday, Wednesday, Friday. The patient's potassium is 5.5. I will repeat it tomorrow. If it is elevated, he may need to be dialyzed over the weekend. Otherwise, resume his dialysis upon discharge. 4. Urinary tract infection, questionable if this is a urinary tract infection versus colonization. The patient is clinically asymptomatic, although his urinalysis is grossly positive. The patient has been given 1 gram IV Rocephin in the ER. I will place him on oral Cipro for now.   CODE STATUS: The patient is a full code.   TIME SPENT ON ADMISSION: 50 minutes.   ____________________________ Belia Heman. Verdell Carmine, MD vjs:OSi D: 05/30/2013 13:36:21 ET T: 05/30/2013 14:25:19 ET JOB#: [id number]  cc: Belia Heman. Verdell Carmine, MD, <Dictator> Henreitta Leber MD ELECTRONICALLY SIGNED 05/30/2013 15:28

## 2014-12-10 NOTE — Discharge Summary (Signed)
PATIENT NAME:  Luis Walter, Luis Walter MR#:  [id number] DATE OF BIRTH:  1950/10/31  DATE OF ADMISSION:  05/30/2013 DATE OF DISCHARGE:  05/31/2013  PRIMARY CARE PHYSICIAN:  Richard L. Rosanna Randy, MD  DISCHARGE DIAGNOSES: Acute cerebrovascular accident, urinary tract infection, diabetes, end-stage renal disease on dialysis.   CONDITION: Stable.   CODE STATUS: FULL CODE.   HOME MEDICATIONS:  1.  Glucotrol XL tablets 5 mg once a day as needed for sugar.  2.  Renagel 800 mg p.o. at bedtime.  3.  Aspirin 325 mg p.o. daily.  4.  Cipro 250 mg p.o. q. 12 hours for 5 days.  5.  Atorvastatin 20 mg p.o. daily.   DIET: Low-sodium, low-fat, low-cholesterol, ADA, renal diet.   ACTIVITY: As tolerated.   FOLLOWUP CARE: Follow up with PCP within 1 to 2 weeks.   REASON FOR ADMISSION: Drooling and left-sided facial droop.  HOSPITAL COURSE: The patient is a 64 year old African American male with a history of CVA, ESRD on dialysis and diabetes, presented to the ED with drooling and left-sided facial droop; however, by the time the patient presented in the ED, the patient's symptoms had much  resolved. For detailed history and physical examination, please refer to the admission note dictated by Dr. Verdell Carmine. On admission date, the patient's laboratory data showed BUN 29, creatinine 9.2, sodium 131, potassium 5.5, chloride 99, bicarb 25. The patient's urinalysis showed 3+ leukocyte esterase with WBC 242. The patient's CT scan of the head was negative. Chest x-ray showed no acute disease of the chest.   1.  After admission, the patient was started with CVA protocol. The patient had been treated with aspirin, statin and neuro checks. The patient got a carotid duplex, which was negative for stenosis. The patient also got MRI of brain, which showed small punctate, acute nonhemorrhagic right pontine infarct.  2.  The patient's diabetes has been treated with sliding scale.  3.  ESRD on dialysis with UTI.  The patient received 1  dose, 1 g of Rocephin in the ED and had been on Cipro p.o. after admission.   The patient has no complaints. Vital signs stable. Physical examination is unremarkable. He is clinically stable and will be discharged to home today. I discussed the patient's discharge plan with the patient, the patient's wife and nurse.   TIME SPENT: About 33 minutes.   ____________________________ Demetrios Loll, MD qc:cs D: 05/31/2013 11:52:00 ET T: 05/31/2013 14:20:18 ET JOB#: [id number]  cc: Demetrios Loll, MD, <Dictator> Demetrios Loll MD ELECTRONICALLY SIGNED 06/01/2013 19:24

## 2014-12-13 DIAGNOSIS — D631 Anemia in chronic kidney disease: Secondary | ICD-10-CM | POA: Diagnosis not present

## 2014-12-13 DIAGNOSIS — N2581 Secondary hyperparathyroidism of renal origin: Secondary | ICD-10-CM | POA: Diagnosis not present

## 2014-12-13 DIAGNOSIS — D508 Other iron deficiency anemias: Secondary | ICD-10-CM | POA: Diagnosis not present

## 2014-12-13 DIAGNOSIS — N186 End stage renal disease: Secondary | ICD-10-CM | POA: Diagnosis not present

## 2014-12-13 DIAGNOSIS — E1129 Type 2 diabetes mellitus with other diabetic kidney complication: Secondary | ICD-10-CM | POA: Diagnosis not present

## 2014-12-15 DIAGNOSIS — N2581 Secondary hyperparathyroidism of renal origin: Secondary | ICD-10-CM | POA: Diagnosis not present

## 2014-12-15 DIAGNOSIS — N186 End stage renal disease: Secondary | ICD-10-CM | POA: Diagnosis not present

## 2014-12-15 DIAGNOSIS — D508 Other iron deficiency anemias: Secondary | ICD-10-CM | POA: Diagnosis not present

## 2014-12-15 DIAGNOSIS — E1129 Type 2 diabetes mellitus with other diabetic kidney complication: Secondary | ICD-10-CM | POA: Diagnosis not present

## 2014-12-15 DIAGNOSIS — D631 Anemia in chronic kidney disease: Secondary | ICD-10-CM | POA: Diagnosis not present

## 2014-12-17 DIAGNOSIS — E1129 Type 2 diabetes mellitus with other diabetic kidney complication: Secondary | ICD-10-CM | POA: Diagnosis not present

## 2014-12-17 DIAGNOSIS — D631 Anemia in chronic kidney disease: Secondary | ICD-10-CM | POA: Diagnosis not present

## 2014-12-17 DIAGNOSIS — D508 Other iron deficiency anemias: Secondary | ICD-10-CM | POA: Diagnosis not present

## 2014-12-17 DIAGNOSIS — N2581 Secondary hyperparathyroidism of renal origin: Secondary | ICD-10-CM | POA: Diagnosis not present

## 2014-12-17 DIAGNOSIS — N186 End stage renal disease: Secondary | ICD-10-CM | POA: Diagnosis not present

## 2014-12-18 DIAGNOSIS — E1122 Type 2 diabetes mellitus with diabetic chronic kidney disease: Secondary | ICD-10-CM | POA: Diagnosis not present

## 2014-12-18 DIAGNOSIS — Z992 Dependence on renal dialysis: Secondary | ICD-10-CM | POA: Diagnosis not present

## 2014-12-18 DIAGNOSIS — N186 End stage renal disease: Secondary | ICD-10-CM | POA: Diagnosis not present

## 2014-12-20 DIAGNOSIS — D509 Iron deficiency anemia, unspecified: Secondary | ICD-10-CM | POA: Diagnosis not present

## 2014-12-20 DIAGNOSIS — R509 Fever, unspecified: Secondary | ICD-10-CM | POA: Diagnosis not present

## 2014-12-20 DIAGNOSIS — D631 Anemia in chronic kidney disease: Secondary | ICD-10-CM | POA: Diagnosis not present

## 2014-12-20 DIAGNOSIS — N186 End stage renal disease: Secondary | ICD-10-CM | POA: Diagnosis not present

## 2014-12-20 DIAGNOSIS — N2581 Secondary hyperparathyroidism of renal origin: Secondary | ICD-10-CM | POA: Diagnosis not present

## 2014-12-20 DIAGNOSIS — E1129 Type 2 diabetes mellitus with other diabetic kidney complication: Secondary | ICD-10-CM | POA: Diagnosis not present

## 2014-12-22 DIAGNOSIS — E1129 Type 2 diabetes mellitus with other diabetic kidney complication: Secondary | ICD-10-CM | POA: Diagnosis not present

## 2014-12-22 DIAGNOSIS — D509 Iron deficiency anemia, unspecified: Secondary | ICD-10-CM | POA: Diagnosis not present

## 2014-12-22 DIAGNOSIS — R509 Fever, unspecified: Secondary | ICD-10-CM | POA: Diagnosis not present

## 2014-12-22 DIAGNOSIS — N2581 Secondary hyperparathyroidism of renal origin: Secondary | ICD-10-CM | POA: Diagnosis not present

## 2014-12-22 DIAGNOSIS — N186 End stage renal disease: Secondary | ICD-10-CM | POA: Diagnosis not present

## 2014-12-22 DIAGNOSIS — D631 Anemia in chronic kidney disease: Secondary | ICD-10-CM | POA: Diagnosis not present

## 2014-12-23 ENCOUNTER — Encounter: Payer: Self-pay | Admitting: Emergency Medicine

## 2014-12-23 ENCOUNTER — Emergency Department
Admission: EM | Admit: 2014-12-23 | Discharge: 2014-12-23 | Disposition: A | Discharge status: Home / Self Care | Payer: Medicare Other | Source: Home / Self Care | Attending: Emergency Medicine | Admitting: Emergency Medicine

## 2014-12-23 DIAGNOSIS — A419 Sepsis, unspecified organism: Secondary | ICD-10-CM | POA: Diagnosis not present

## 2014-12-23 DIAGNOSIS — R509 Fever, unspecified: Secondary | ICD-10-CM | POA: Diagnosis not present

## 2014-12-23 DIAGNOSIS — A4181 Sepsis due to Enterococcus: Secondary | ICD-10-CM | POA: Diagnosis not present

## 2014-12-23 DIAGNOSIS — K2970 Gastritis, unspecified, without bleeding: Secondary | ICD-10-CM

## 2014-12-23 DIAGNOSIS — I5022 Chronic systolic (congestive) heart failure: Secondary | ICD-10-CM | POA: Diagnosis not present

## 2014-12-23 DIAGNOSIS — Z87891 Personal history of nicotine dependence: Secondary | ICD-10-CM

## 2014-12-23 DIAGNOSIS — J690 Pneumonitis due to inhalation of food and vomit: Secondary | ICD-10-CM | POA: Diagnosis not present

## 2014-12-23 DIAGNOSIS — N186 End stage renal disease: Secondary | ICD-10-CM | POA: Diagnosis not present

## 2014-12-23 DIAGNOSIS — I120 Hypertensive chronic kidney disease with stage 5 chronic kidney disease or end stage renal disease: Secondary | ICD-10-CM | POA: Diagnosis not present

## 2014-12-23 DIAGNOSIS — I4891 Unspecified atrial fibrillation: Secondary | ICD-10-CM | POA: Diagnosis not present

## 2014-12-23 DIAGNOSIS — E119 Type 2 diabetes mellitus without complications: Secondary | ICD-10-CM | POA: Insufficient documentation

## 2014-12-23 LAB — COMPREHENSIVE METABOLIC PANEL
ALK PHOS: 92 U/L (ref 38–126)
ALT: 15 U/L — ABNORMAL LOW (ref 17–63)
ANION GAP: 14 (ref 5–15)
AST: 16 U/L (ref 15–41)
Albumin: 3.6 g/dL (ref 3.5–5.0)
BUN: 27 mg/dL — ABNORMAL HIGH (ref 6–20)
CHLORIDE: 92 mmol/L — AB (ref 101–111)
CO2: 32 mmol/L (ref 22–32)
Calcium: 9.1 mg/dL (ref 8.9–10.3)
Creatinine, Ser: 8.33 mg/dL — ABNORMAL HIGH (ref 0.61–1.24)
GFR, EST AFRICAN AMERICAN: 7 mL/min — AB (ref >60)
GFR, EST NON AFRICAN AMERICAN: 6 mL/min — AB (ref >60)
GLUCOSE: 159 mg/dL — AB (ref 65–99)
POTASSIUM: 3.3 mmol/L — AB (ref 3.5–5.1)
Sodium: 138 mmol/L (ref 135–145)
Total Bilirubin: 1.3 mg/dL — ABNORMAL HIGH (ref 0.3–1.2)
Total Protein: 8 g/dL (ref 6.5–8.1)

## 2014-12-23 LAB — CBC WITH DIFFERENTIAL/PLATELET
Basophils Absolute: 0.1 10*3/uL (ref 0–0.1)
Basophils Relative: 1 %
EOS ABS: 0 10*3/uL (ref 0–0.7)
Eosinophils Relative: 0 %
HEMATOCRIT: 32.2 % — AB (ref 40.0–52.0)
Hemoglobin: 10.3 g/dL — ABNORMAL LOW (ref 13.0–18.0)
Lymphocytes Relative: 13 %
Lymphs Abs: 0.8 10*3/uL — ABNORMAL LOW (ref 1.0–3.6)
MCH: 27.8 pg (ref 26.0–34.0)
MCHC: 31.9 g/dL — AB (ref 32.0–36.0)
MCV: 87.3 fL (ref 80.0–100.0)
MONO ABS: 0.3 10*3/uL (ref 0.2–1.0)
MONOS PCT: 6 %
Neutro Abs: 4.8 10*3/uL (ref 1.4–6.5)
Neutrophils Relative %: 80 %
Platelets: 252 10*3/uL (ref 150–440)
RBC: 3.69 MIL/uL — AB (ref 4.40–5.90)
RDW: 15.6 % — ABNORMAL HIGH (ref 11.5–14.5)
WBC: 6 10*3/uL (ref 3.8–10.6)

## 2014-12-23 LAB — LIPASE, BLOOD: LIPASE: 31 U/L (ref 22–51)

## 2014-12-23 MED ORDER — ONDANSETRON HCL 4 MG/2ML IJ SOLN
INTRAMUSCULAR | Status: AC
  Administered 2014-12-23: 4 mg via INTRAVENOUS
  Filled 2014-12-23: qty 2

## 2014-12-23 MED ORDER — MORPHINE SULFATE 4 MG/ML IJ SOLN
4.0000 mg | Freq: Once | INTRAMUSCULAR | Status: AC
Start: 2014-12-23 — End: 2014-12-23
  Administered 2014-12-23: 4 mg via INTRAVENOUS

## 2014-12-23 MED ORDER — ONDANSETRON HCL 4 MG/2ML IJ SOLN
4.0000 mg | Freq: Once | INTRAMUSCULAR | Status: AC
  Administered 2014-12-23: 4 mg via INTRAVENOUS

## 2014-12-23 MED ORDER — MORPHINE SULFATE 4 MG/ML IJ SOLN
INTRAMUSCULAR | Status: AC
  Administered 2014-12-23: 4 mg via INTRAVENOUS
  Filled 2014-12-23: qty 1

## 2014-12-23 MED ORDER — ONDANSETRON HCL 4 MG PO TABS: 4.0000 mg | ORAL_TABLET | Freq: Every day | ORAL | Status: DC | PRN

## 2014-12-23 MED ORDER — DOCUSATE SODIUM 100 MG PO CAPS: 100.0000 mg | ORAL_CAPSULE | Freq: Two times a day (BID) | ORAL | Status: DC

## 2014-12-23 NOTE — ED Provider Notes (Signed)
[facility] Emergency Department Provider Note    ____________________________________________  Time seen: 1110  I have reviewed the triage vital signs and the nursing notes.   HISTORY  Chief Complaint Emesis    HPI Luis Walter is a 64 y.o. male who presents with nausea vomiting since yesterday evening around 6:00. Patient reports he does have abdominal cramping when vomiting but otherwise no abdominal pain. The vomit is nonbilious and nonbloody. He denies diarrhea. Does report chills. And muscle cramping. He has not taken any medications. Eating makes his nausea far worse.     Past Medical History  Diagnosis Date  . Renal disorder   . Diabetes mellitus without complication     There are no active problems to display for this patient.   History reviewed. No pertinent past surgical history.  No current outpatient prescriptions on file.  Allergies Review of patient's allergies indicates no known allergies.  History reviewed. No pertinent family history.  Social History History  Substance Use Topics  . Smoking status: Former Research scientist (life sciences)  . Smokeless tobacco: Not on file  . Alcohol Use: No    Review of Systems  Constitutional: Negative for fever. Eyes: Negative for visual changes. ENT: Negative for sore throat. Cardiovascular: Negative for chest pain. Respiratory: Negative for shortness of breath. Gastrointestinal: Negative for abdominal pain, vomiting and diarrhea. Genitourinary: Negative for dysuria. Musculoskeletal: Negative for back pain. Skin: Negative for rash. Neurological: Negative for headaches, focal weakness or numbness.   10-point ROS otherwise negative.  ____________________________________________   PHYSICAL EXAM:  VITAL SIGNS: ED Triage Vitals  Enc Vitals Group     BP 12/23/14 1033 168/81 mmHg     Pulse Rate 12/23/14 1033 80     Resp 12/23/14 1033 20     Temp 12/23/14 1033 97.4 F (36.3 C)     Temp Source  12/23/14 1033 Oral     SpO2 12/23/14 1033 100 %     Weight 12/23/14 1033 180 lb (81.647 kg)     Height 12/23/14 1033 5\' 7"  (1.702 m)     Head Cir --      Peak Flow --      Pain Score --      Pain Loc --      Pain Edu? --      Excl. in [place]? --      Constitutional: Alert and oriented. Well appearing and in no acute distress. Eyes: Conjunctivae are normal. PERRL. Normal extraocular movements. ENT   Head: Normocephalic and atraumatic.   Nose: No congestion/rhinnorhea.   Mouth/Throat: Mucous membranes are moist.   Neck: No stridor. Hematological/Lymphatic/Immunilogical: No cervical lymphadenopathy. Cardiovascular: Normal rate, regular rhythm. Normal and symmetric distal pulses are present in all extremities. No murmurs, rubs, or gallops. Respiratory: Normal respiratory effort without tachypnea nor retractions. Breath sounds are clear and equal bilaterally. No wheezes/rales/rhonchi. Gastrointestinal: Soft and nontender. No distention. There is no CVA tenderness. Genitourinary: Deferred Musculoskeletal: Nontender with normal range of motion in all extremities. No joint effusions.  No lower extremity tenderness nor edema. Neurologic:  Normal speech and language. No gross focal neurologic deficits are appreciated. Speech is normal. No gait instability. Skin:  Skin is warm, dry and intact. No rash noted. Psychiatric: Mood and affect are normal. Speech and behavior are normal. Patient exhibits appropriate insight and judgment.  ____________________________________________    LABS (pertinent positives/negatives)  No acute distress  ____________________________________________   EKG  None  ____________________________________________    RADIOLOGY  None  ____________________________________________  PROCEDURES  Procedure(s) performed: None  Critical Care performed: No  ____________________________________________   INITIAL IMPRESSION / ASSESSMENT AND PLAN  / ED COURSE  Pertinent labs & imaging results that were available during my care of the patient were reviewed by me and considered in my medical decision making (see chart for details).  History of present illness an exam consistent with an acute gastritis. We'll treat with normal saline bolus and anti-medics IV. I will reassess  ----------------------------------------- 2:44 PM on 12/23/2014 -----------------------------------------  Patient reports feeling significantly improved, he is tolerating fluids. He is anxious to go home and rest in bed. We discussed return precautions. He will go to dialysis tomorrow morning. ____________________________________________   FINAL CLINICAL IMPRESSION(S) / ED DIAGNOSES  Final diagnoses:  Gastritis     Lavonia Drafts, MD 12/23/14 [phone]

## 2014-12-23 NOTE — ED Notes (Signed)
Vomiting x 1 day

## 2014-12-23 NOTE — Discharge Instructions (Signed)
Gastritis, Adult °Gastritis is soreness and puffiness (inflammation) of the lining of the stomach. If you do not get help, gastritis can cause bleeding and sores (ulcers) in the stomach. °HOME CARE  °· Only take medicine as told by your doctor. °· If you were given antibiotic medicines, take them as told. Finish the medicines even if you start to feel better. °· Drink enough fluids to keep your pee (urine) clear or pale yellow. °· Avoid foods and drinks that make your problems worse. Foods you may want to avoid include: °¨ Caffeine or alcohol. °¨ Chocolate. °¨ Mint. °¨ Garlic and onions. °¨ Spicy foods. °¨ Citrus fruits, including oranges, lemons, or limes. °¨ Food containing tomatoes, including sauce, chili, salsa, and pizza. °¨ Fried and fatty foods. °· Eat small meals throughout the day instead of large meals. °GET HELP RIGHT AWAY IF:  °· You have black or dark red poop (stools). °· You throw up (vomit) blood. It may look like coffee grounds. °· You cannot keep fluids down. °· Your belly (abdominal) pain gets worse. °· You have a fever. °· You do not feel better after 1 week. °· You have any other questions or concerns. °MAKE SURE YOU:  °· Understand these instructions. °· Will watch your condition. °· Will get help right away if you are not doing well or get worse. °Document Released: 01/23/2008 Document Revised: 10/29/2011 Document Reviewed: 09/19/2011 °Exit[employer]® Patient Information ©2015 [employer]. This information is not intended to replace advice given to you by your health care provider. Make sure you discuss any questions you have with your health care provider. ° °

## 2014-12-24 DIAGNOSIS — D509 Iron deficiency anemia, unspecified: Secondary | ICD-10-CM | POA: Diagnosis not present

## 2014-12-24 DIAGNOSIS — N2581 Secondary hyperparathyroidism of renal origin: Secondary | ICD-10-CM | POA: Diagnosis not present

## 2014-12-24 DIAGNOSIS — D631 Anemia in chronic kidney disease: Secondary | ICD-10-CM | POA: Diagnosis not present

## 2014-12-24 DIAGNOSIS — R509 Fever, unspecified: Secondary | ICD-10-CM | POA: Diagnosis not present

## 2014-12-24 DIAGNOSIS — E1129 Type 2 diabetes mellitus with other diabetic kidney complication: Secondary | ICD-10-CM | POA: Diagnosis not present

## 2014-12-24 DIAGNOSIS — N186 End stage renal disease: Secondary | ICD-10-CM | POA: Diagnosis not present

## 2014-12-25 ENCOUNTER — Emergency Department: Payer: Medicare Other

## 2014-12-25 ENCOUNTER — Inpatient Hospital Stay: Payer: Medicare Other

## 2014-12-25 ENCOUNTER — Inpatient Hospital Stay
Admission: EM | Admit: 2014-12-25 | Discharge: 2015-01-01 | DRG: 871 | Disposition: A | Discharge status: Home / Self Care | Payer: Medicare Other | Attending: Internal Medicine | Admitting: Internal Medicine

## 2014-12-25 DIAGNOSIS — A419 Sepsis, unspecified organism: Secondary | ICD-10-CM

## 2014-12-25 DIAGNOSIS — I340 Nonrheumatic mitral (valve) insufficiency: Secondary | ICD-10-CM | POA: Diagnosis not present

## 2014-12-25 DIAGNOSIS — N186 End stage renal disease: Secondary | ICD-10-CM | POA: Diagnosis present

## 2014-12-25 DIAGNOSIS — Z7982 Long term (current) use of aspirin: Secondary | ICD-10-CM | POA: Diagnosis not present

## 2014-12-25 DIAGNOSIS — J129 Viral pneumonia, unspecified: Secondary | ICD-10-CM | POA: Diagnosis not present

## 2014-12-25 DIAGNOSIS — R17 Unspecified jaundice: Secondary | ICD-10-CM | POA: Diagnosis not present

## 2014-12-25 DIAGNOSIS — I499 Cardiac arrhythmia, unspecified: Secondary | ICD-10-CM | POA: Diagnosis not present

## 2014-12-25 DIAGNOSIS — Z8673 Personal history of transient ischemic attack (TIA), and cerebral infarction without residual deficits: Secondary | ICD-10-CM | POA: Diagnosis not present

## 2014-12-25 DIAGNOSIS — R000 Tachycardia, unspecified: Secondary | ICD-10-CM | POA: Diagnosis not present

## 2014-12-25 DIAGNOSIS — J189 Pneumonia, unspecified organism: Secondary | ICD-10-CM | POA: Diagnosis not present

## 2014-12-25 DIAGNOSIS — E0590 Thyrotoxicosis, unspecified without thyrotoxic crisis or storm: Secondary | ICD-10-CM | POA: Diagnosis not present

## 2014-12-25 DIAGNOSIS — J150 Pneumonia due to Klebsiella pneumoniae: Secondary | ICD-10-CM

## 2014-12-25 DIAGNOSIS — I5022 Chronic systolic (congestive) heart failure: Secondary | ICD-10-CM | POA: Diagnosis present

## 2014-12-25 DIAGNOSIS — A4181 Sepsis due to Enterococcus: Secondary | ICD-10-CM | POA: Diagnosis present

## 2014-12-25 DIAGNOSIS — R509 Fever, unspecified: Secondary | ICD-10-CM | POA: Diagnosis not present

## 2014-12-25 DIAGNOSIS — R933 Abnormal findings on diagnostic imaging of other parts of digestive tract: Secondary | ICD-10-CM | POA: Diagnosis not present

## 2014-12-25 DIAGNOSIS — J690 Pneumonitis due to inhalation of food and vomit: Secondary | ICD-10-CM | POA: Diagnosis present

## 2014-12-25 DIAGNOSIS — R410 Disorientation, unspecified: Secondary | ICD-10-CM

## 2014-12-25 DIAGNOSIS — K8010 Calculus of gallbladder with chronic cholecystitis without obstruction: Secondary | ICD-10-CM | POA: Diagnosis not present

## 2014-12-25 DIAGNOSIS — E119 Type 2 diabetes mellitus without complications: Secondary | ICD-10-CM

## 2014-12-25 DIAGNOSIS — K5900 Constipation, unspecified: Secondary | ICD-10-CM | POA: Diagnosis present

## 2014-12-25 DIAGNOSIS — I120 Hypertensive chronic kidney disease with stage 5 chronic kidney disease or end stage renal disease: Secondary | ICD-10-CM | POA: Diagnosis present

## 2014-12-25 DIAGNOSIS — A4159 Other Gram-negative sepsis: Secondary | ICD-10-CM | POA: Diagnosis present

## 2014-12-25 DIAGNOSIS — Z992 Dependence on renal dialysis: Secondary | ICD-10-CM | POA: Diagnosis not present

## 2014-12-25 DIAGNOSIS — K8020 Calculus of gallbladder without cholecystitis without obstruction: Secondary | ICD-10-CM | POA: Diagnosis present

## 2014-12-25 DIAGNOSIS — N2581 Secondary hyperparathyroidism of renal origin: Secondary | ICD-10-CM | POA: Diagnosis present

## 2014-12-25 DIAGNOSIS — D631 Anemia in chronic kidney disease: Secondary | ICD-10-CM | POA: Diagnosis present

## 2014-12-25 DIAGNOSIS — D649 Anemia, unspecified: Secondary | ICD-10-CM | POA: Diagnosis not present

## 2014-12-25 DIAGNOSIS — R400 Somnolence: Secondary | ICD-10-CM | POA: Diagnosis not present

## 2014-12-25 DIAGNOSIS — I4891 Unspecified atrial fibrillation: Secondary | ICD-10-CM | POA: Diagnosis not present

## 2014-12-25 DIAGNOSIS — N189 Chronic kidney disease, unspecified: Secondary | ICD-10-CM | POA: Diagnosis not present

## 2014-12-25 DIAGNOSIS — Z87891 Personal history of nicotine dependence: Secondary | ICD-10-CM | POA: Diagnosis not present

## 2014-12-25 DIAGNOSIS — R7989 Other specified abnormal findings of blood chemistry: Secondary | ICD-10-CM | POA: Diagnosis not present

## 2014-12-25 DIAGNOSIS — K828 Other specified diseases of gallbladder: Secondary | ICD-10-CM | POA: Diagnosis not present

## 2014-12-25 DIAGNOSIS — I2510 Atherosclerotic heart disease of native coronary artery without angina pectoris: Secondary | ICD-10-CM | POA: Diagnosis present

## 2014-12-25 DIAGNOSIS — K811 Chronic cholecystitis: Secondary | ICD-10-CM | POA: Diagnosis not present

## 2014-12-25 DIAGNOSIS — R740 Nonspecific elevation of levels of transaminase and lactic acid dehydrogenase [LDH]: Secondary | ICD-10-CM | POA: Diagnosis not present

## 2014-12-25 DIAGNOSIS — R946 Abnormal results of thyroid function studies: Secondary | ICD-10-CM | POA: Diagnosis not present

## 2014-12-25 HISTORY — DX: Chronic systolic (congestive) heart failure: I50.22

## 2014-12-25 HISTORY — DX: Cerebral infarction, unspecified: I63.9

## 2014-12-25 HISTORY — DX: Atherosclerotic heart disease of native coronary artery without angina pectoris: I25.10

## 2014-12-25 HISTORY — DX: End stage renal disease: N18.6

## 2014-12-25 LAB — COMPREHENSIVE METABOLIC PANEL
ALK PHOS: 106 U/L (ref 38–126)
ALT: 62 U/L (ref 17–63)
AST: 113 U/L — ABNORMAL HIGH (ref 15–41)
Albumin: 2.9 g/dL — ABNORMAL LOW (ref 3.5–5.0)
Anion gap: 15 (ref 5–15)
BUN: 38 mg/dL — ABNORMAL HIGH (ref 6–20)
CO2: 30 mmol/L (ref 22–32)
Calcium: 8.6 mg/dL — ABNORMAL LOW (ref 8.9–10.3)
Chloride: 94 mmol/L — ABNORMAL LOW (ref 101–111)
Creatinine, Ser: 9.25 mg/dL — ABNORMAL HIGH (ref 0.61–1.24)
GFR calc Af Amer: 6 mL/min — ABNORMAL LOW (ref >60)
GFR, EST NON AFRICAN AMERICAN: 5 mL/min — AB (ref >60)
GLUCOSE: 212 mg/dL — AB (ref 65–99)
POTASSIUM: 3.6 mmol/L (ref 3.5–5.1)
SODIUM: 139 mmol/L (ref 135–145)
Total Bilirubin: 6.4 mg/dL — ABNORMAL HIGH (ref 0.3–1.2)
Total Protein: 6.7 g/dL (ref 6.5–8.1)

## 2014-12-25 LAB — CBC WITH DIFFERENTIAL/PLATELET
BASOS ABS: 0 10*3/uL (ref 0–0.1)
Basophils Relative: 0 %
Eosinophils Absolute: 0 10*3/uL (ref 0–0.7)
Eosinophils Relative: 0 %
HCT: 29.5 % — ABNORMAL LOW (ref 40.0–52.0)
Hemoglobin: 9.2 g/dL — ABNORMAL LOW (ref 13.0–18.0)
LYMPHS ABS: 0.5 10*3/uL — AB (ref 1.0–3.6)
Lymphocytes Relative: 2 %
MCH: 27.4 pg (ref 26.0–34.0)
MCHC: 31.3 g/dL — AB (ref 32.0–36.0)
MCV: 87.7 fL (ref 80.0–100.0)
MONO ABS: 1.2 10*3/uL — AB (ref 0.2–1.0)
Monocytes Relative: 6 %
Neutro Abs: 18.6 10*3/uL — ABNORMAL HIGH (ref 1.4–6.5)
Neutrophils Relative %: 92 %
Platelets: 150 10*3/uL (ref 150–440)
RBC: 3.36 MIL/uL — ABNORMAL LOW (ref 4.40–5.90)
RDW: 15.8 % — AB (ref 11.5–14.5)
WBC: 20.4 10*3/uL — ABNORMAL HIGH (ref 3.8–10.6)

## 2014-12-25 LAB — RAPID INFLUENZA A&B ANTIGENS
Influenza A (ARMC): NEGATIVE
Influenza B (ARMC): NEGATIVE

## 2014-12-25 LAB — GLUCOSE, CAPILLARY
GLUCOSE-CAPILLARY: 194 mg/dL — AB (ref 70–99)
Glucose-Capillary: 193 mg/dL — ABNORMAL HIGH (ref 70–99)
Glucose-Capillary: 182 mg/dL — ABNORMAL HIGH (ref 70–99)

## 2014-12-25 LAB — LACTIC ACID, PLASMA: Lactic Acid, Venous: 2.2 mmol/L (ref 0.5–2.0)

## 2014-12-25 MED ORDER — SODIUM CHLORIDE 0.9 % IV SOLN
1000.0000 mL | Freq: Once | INTRAVENOUS | Status: AC
  Administered 2014-12-25: 1000 mL via INTRAVENOUS

## 2014-12-25 MED ORDER — ALBUTEROL SULFATE (2.5 MG/3ML) 0.083% IN NEBU
2.5000 mg | INHALATION_SOLUTION | Freq: Four times a day (QID) | RESPIRATORY_TRACT | Status: DC
  Administered 2014-12-25 – 2014-12-26 (×6): 2.5 mg via RESPIRATORY_TRACT
  Filled 2014-12-25 (×6): qty 3

## 2014-12-25 MED ORDER — SEVELAMER CARBONATE 800 MG PO TABS
3200.0000 mg | ORAL_TABLET | Freq: Three times a day (TID) | ORAL | Status: DC
  Administered 2014-12-25 – 2015-01-01 (×16): 3200 mg via ORAL
  Filled 2014-12-25 (×17): qty 4

## 2014-12-25 MED ORDER — ACETAMINOPHEN 325 MG PO TABS
650.0000 mg | ORAL_TABLET | Freq: Four times a day (QID) | ORAL | Status: DC | PRN
  Administered 2015-01-01: 650 mg via ORAL
  Filled 2014-12-25: qty 2

## 2014-12-25 MED ORDER — DOCUSATE SODIUM 100 MG PO CAPS: 100.0000 mg | ORAL_CAPSULE | Freq: Two times a day (BID) | ORAL | Status: DC

## 2014-12-25 MED ORDER — PIPERACILLIN-TAZOBACTAM 3.375 G IVPB
INTRAVENOUS | Status: AC
  Filled 2014-12-25: qty 50

## 2014-12-25 MED ORDER — VANCOMYCIN HCL IN DEXTROSE 1-5 GM/200ML-% IV SOLN: 1000.0000 mg | Freq: Once | INTRAVENOUS | Status: DC

## 2014-12-25 MED ORDER — VANCOMYCIN HCL IN DEXTROSE 1-5 GM/200ML-% IV SOLN
1000.0000 mg | INTRAVENOUS | Status: DC
  Administered 2014-12-27 – 2014-12-29 (×2): 1000 mg via INTRAVENOUS
  Filled 2014-12-25 (×5): qty 200

## 2014-12-25 MED ORDER — ASPIRIN EC 325 MG PO TBEC
DELAYED_RELEASE_TABLET | ORAL | Status: AC
  Administered 2014-12-25: 325 mg
  Filled 2014-12-25: qty 1

## 2014-12-25 MED ORDER — VANCOMYCIN HCL IN DEXTROSE 1-5 GM/200ML-% IV SOLN
1000.0000 mg | INTRAVENOUS | Status: AC
  Administered 2014-12-25: 1000 mg via INTRAVENOUS
  Filled 2014-12-25: qty 200

## 2014-12-25 MED ORDER — ACETAMINOPHEN 325 MG PO TABS
ORAL_TABLET | ORAL | Status: AC
  Administered 2014-12-25: 650 mg
  Filled 2014-12-25: qty 2

## 2014-12-25 MED ORDER — DOCUSATE SODIUM 100 MG PO CAPS
100.0000 mg | ORAL_CAPSULE | Freq: Two times a day (BID) | ORAL | Status: DC
  Administered 2014-12-25 – 2014-12-26 (×3): 100 mg via ORAL
  Filled 2014-12-25 (×3): qty 1

## 2014-12-25 MED ORDER — PIPERACILLIN-TAZOBACTAM 3.375 G IVPB
3.3750 g | Freq: Two times a day (BID) | INTRAVENOUS | Status: DC
  Administered 2014-12-25 – 2014-12-31 (×11): 3.375 g via INTRAVENOUS
  Filled 2014-12-25 (×14): qty 50

## 2014-12-25 MED ORDER — ASPIRIN 325 MG PO TABS
325.0000 mg | ORAL_TABLET | Freq: Every day | ORAL | Status: DC
  Administered 2014-12-26: 325 mg via ORAL
  Filled 2014-12-25 (×2): qty 1

## 2014-12-25 MED ORDER — NOREPINEPHRINE 4 MG/250ML-% IV SOLN: 0.0000 ug/min | INTRAVENOUS | Status: DC

## 2014-12-25 MED ORDER — POLYETHYLENE GLYCOL 3350 17 G PO PACK
17.0000 g | PACK | Freq: Every day | ORAL | Status: DC | PRN
Start: 2014-12-25 — End: 2015-01-01
  Administered 2014-12-30: 17 g via ORAL
  Filled 2014-12-25: qty 1

## 2014-12-25 MED ORDER — ACETAMINOPHEN 650 MG RE SUPP: 650.0000 mg | Freq: Four times a day (QID) | RECTAL | Status: DC | PRN

## 2014-12-25 MED ORDER — IPRATROPIUM BROMIDE 0.02 % IN SOLN
0.5000 mg | Freq: Four times a day (QID) | RESPIRATORY_TRACT | Status: DC
  Administered 2014-12-25 – 2014-12-27 (×7): 0.5 mg via RESPIRATORY_TRACT
  Filled 2014-12-25 (×7): qty 2.5

## 2014-12-25 MED ORDER — HEPARIN SODIUM (PORCINE) 5000 UNIT/ML IJ SOLN
5000.0000 [IU] | Freq: Three times a day (TID) | INTRAMUSCULAR | Status: DC
  Administered 2014-12-25 – 2015-01-01 (×20): 5000 [IU] via SUBCUTANEOUS
  Filled 2014-12-25 (×22): qty 1

## 2014-12-25 MED ORDER — ALBUTEROL SULFATE (2.5 MG/3ML) 0.083% IN NEBU: 2.5000 mg | INHALATION_SOLUTION | RESPIRATORY_TRACT | Status: DC | PRN

## 2014-12-25 MED ORDER — NOREPINEPHRINE BITARTRATE 1 MG/ML IV SOLN
0.0000 ug/min | INTRAVENOUS | Status: DC
  Administered 2014-12-25: 2 ug/min via INTRAVENOUS
  Filled 2014-12-25: qty 4

## 2014-12-25 MED ORDER — ONDANSETRON HCL 4 MG/2ML IJ SOLN: 4.0000 mg | Freq: Four times a day (QID) | INTRAMUSCULAR | Status: DC | PRN

## 2014-12-25 MED ORDER — PIPERACILLIN-TAZOBACTAM 3.375 G IVPB 30 MIN
3.3750 g | Freq: Once | INTRAVENOUS | Status: AC
  Administered 2014-12-25: 3.375 g via INTRAVENOUS

## 2014-12-25 MED ORDER — VANCOMYCIN HCL IN DEXTROSE 1-5 GM/200ML-% IV SOLN
INTRAVENOUS | Status: AC
  Administered 2014-12-25: 09:00:00
  Filled 2014-12-25: qty 200

## 2014-12-25 MED ORDER — ONDANSETRON HCL 4 MG PO TABS: 4.0000 mg | ORAL_TABLET | Freq: Four times a day (QID) | ORAL | Status: DC | PRN

## 2014-12-25 NOTE — Progress Notes (Signed)
Notified Dr Tressia Miners of abd ultrasound results. New orders for surgery consult and HIDA scan

## 2014-12-25 NOTE — ED Provider Notes (Signed)
[facility] Emergency Department Provider Note  ____________________________________________  Time seen: On arrival 8 AM  I have reviewed the triage vital signs and the nursing notes.   HISTORY  Chief Complaint Code Sepsis   Spouse reports confusion   HPI Luis Walter is a 64 y.o. male who presents with fever shortness of breath and confusion. Patient was seen in the emergency department by me 2 days ago at that time he had nausea and vomiting and gastritis symptoms. Those have mostly resolved but have been replaced by cough shortness of breath and this morning confusion. Temperature noted 103 by triage with a borderline blood pressure. Patient did have dialysis yesterday.     Past Medical History  Diagnosis Date  . Renal disorder   . Diabetes mellitus without complication     There are no active problems to display for this patient.   No past surgical history on file. patient has an AV fistula  Current Outpatient Rx  Name  Route  Sig  Dispense  Refill  . docusate sodium (COLACE) 100 MG capsule   Oral   Take 1 capsule (100 mg total) by mouth 2 (two) times daily.   60 capsule   2   . ondansetron (ZOFRAN) 4 MG tablet   Oral   Take 1 tablet (4 mg total) by mouth daily as needed for nausea or vomiting.   20 tablet   1     Allergies Review of patient's allergies indicates no known allergies.  No family history on file.  Social History History  Substance Use Topics  . Smoking status: Former Research scientist (life sciences)  . Smokeless tobacco: Not on file  . Alcohol Use: No    Review of Systems  Constitutional: Negative for fever. Eyes: Negative for visual changes. ENT: Negative for sore throat. Cardiovascular: Negative for chest pain. Respiratory: Positive for cough and shortness of breath Gastrointestinal: Negative for abdominal pain, vomiting and diarrhea. Genitourinary: Negative for dysuria. Musculoskeletal: Negative for back pain. Skin:  Negative for rash. Neurological: Negative for headaches, focal weakness or numbness. Psychiatric: No anxiety or depression reported  10-point ROS otherwise negative.  ____________________________________________   PHYSICAL EXAM:  VITAL SIGNS: ED Triage Vitals  Enc Vitals Group     BP 12/25/14 0743 93/42 mmHg     Pulse Rate 12/25/14 0743 96     Resp --      Temp 12/25/14 0743 103 F (39.4 C)     Temp Source 12/25/14 0743 Oral     SpO2 12/25/14 0743 83 %     Weight 12/25/14 0743 180 lb (81.647 kg)     Height 12/25/14 0743 5\' 8"  (1.727 m)     Head Cir --      Peak Flow --      Pain Score 12/25/14 0746 0     Pain Loc --      Pain Edu? --      Excl. in [place]? --      Constitutional: Alert and oriented. Appears to answer questions appropriately Eyes: Conjunctivae are normal. PERRL. Normal extraocular movements. ENT   Head: Normocephalic and atraumatic.   Nose: No congestion/rhinnorhea.   Mouth/Throat: Mucous membranes are moist.   Neck: No stridor. Hematological/Lymphatic/Immunilogical: No cervical lymphadenopathy. Cardiovascular: Normal rate, regular rhythm. Normal and symmetric distal pulses are present in all extremities. No murmurs, rubs, or gallops. Respiratory: Normal respiratory effort without tachypnea nor retractions. Breath sounds are clear and equal bilaterally. No wheezes/rales/rhonchi. Gastrointestinal: Soft and nontender. No distention.  There is no CVA tenderness. Genitourinary: deferred Musculoskeletal: Nontender with normal range of motion in all extremities. No joint effusions.  No lower extremity tenderness nor edema. Neurologic:  Normal speech and language. No gross focal neurologic deficits are appreciated. Speech is normal.  Skin:  Skin is warm, dry and intact. No rash noted. Psychiatric: Mood and affect are normal. Speech and behavior are normal. Patient exhibits appropriate insight and judgment.  ____________________________________________     LABS (pertinent positives/negatives)  Blood cell count 20,000, elevated creatinine normal potassium  ____________________________________________   EKG  ED ECG REPORT   Date: 12/25/2014  EKG Time: 8:01 AM  Rate: 97  Rhythm: nonspecific ST and T waves changes, prolonged QT interval  Axis: Normal  Intervals:none  ST&T Change: Nonspecific   ____________________________________________    RADIOLOGY  Chest x-ray concerning for pneumonia given location could be aspiration pneumonia.  ____________________________________________   PROCEDURES  Procedure(s) performed: No  Critical Care performed: yes CRITICAL CARE Performed by: Lavonia Drafts   Total critical care time: 35  Critical care time was exclusive of separately billable procedures and treating other patients.  Critical care was necessary to treat or prevent imminent or life-threatening deterioration.  Critical care was time spent personally by me on the following activities: development of treatment plan with patient and/or surrogate as well as nursing, discussions with consultants, evaluation of patient's response to treatment, examination of patient, obtaining history from patient or surrogate, ordering and performing treatments and interventions, ordering and review of laboratory studies, ordering and review of radiographic studies, pulse oximetry and re-evaluation of patient's condition.   ____________________________________________   INITIAL IMPRESSION / ASSESSMENT AND PLAN / ED COURSE  Pertinent labs & imaging results that were available during my care of the patient were reviewed by me and considered in my medical decision making (see chart for details).  Initial impression is concerning for sepsis versus aspiration pneumonia or both. We'll activate code sepsis although will have to be very careful about giving fluids given his history of end-stage renal disease on  dialysis.  ----------------------------------------- 10:11 AM on 12/25/2014 -----------------------------------------  Patient has received 1 L normal saline lungs continue to be clear to auscultation will continue to give fluids slowly with a total goal of 2500 cc of fluid. Patient received vancomycin and Zosyn overall picture does seem to be consistent with aspiration pneumonia we will admit to hospitalist. _____________________ _______________________   FINAL CLINICAL IMPRESSION(S) / ED DIAGNOSES  Final diagnoses:  Aspiration pneumonia, unspecified aspiration pneumonia type  Sepsis, due to unspecified organism     Lavonia Drafts, MD 12/25/14 1012

## 2014-12-25 NOTE — ED Notes (Signed)
Hx of renal failure - states makes only small amts of urine - unable to provide urine specimen at this time; wife remains at bedside

## 2014-12-25 NOTE — Progress Notes (Signed)
ANTIBIOTIC CONSULT NOTE - INITIAL  Pharmacy Consult for vancomycin and zosyn Indication: pneumonia and rule out sepsis  No Known Allergies  Patient Measurements: Height: 5\' 8"  (172.7 cm) Weight: 180 lb (81.647 kg) IBW/kg (Calculated) : 68.4   Vital Signs: Temp: 98.6 F (37 C) (05/07 1319) Temp Source: Axillary (05/07 1205) BP: 114/66 mmHg (05/07 1319) Pulse Rate: 85 (05/07 1319) Intake/Output from previous day:   Intake/Output from this shift: Total I/O In: 2750 [I.V.:2750] Out: -   Labs:  Recent Labs  12/23/14 1055 12/25/14 0735  WBC 6.0 20.4*  HGB 10.3* 9.2*  PLT 252 150  CREATININE 8.33* 9.25*   Estimated Creatinine Clearance: 8.1 mL/min (by C-G formula based on Cr of 9.25). No results for input(s): VANCOTROUGH, VANCOPEAK, VANCORANDOM, GENTTROUGH, GENTPEAK, GENTRANDOM, TOBRATROUGH, TOBRAPEAK, TOBRARND, AMIKACINPEAK, AMIKACINTROU, AMIKACIN in the last 72 hours.   Microbiology: No results found for this or any previous visit (from the past 720 hour(s)).  Medical History: Past Medical History  Diagnosis Date  . Renal disorder   . Diabetes mellitus without complication     Medications:  See med rec  Assessment: Patient's a 64 y.o M with ESRD on HD MWF who presented to the ED with c/o weakness, cough, SOB, and AMS. CXR with suspicion for PNA.  Patient received 1gm of vancomycin at 0900 and zosyn 3.375 gm IV x1 at 0855 in the ED today.  To continue vancomycin and zosyn for PNA and r/o sepsis.   Goal of Therapy:  Pre-HD vancomycin level: 15-25  Plan:  - vancomycin 1gm IV x1 now to get a total of 2 gm load for today, then 1gm IV after each HD session - zosyn 3.375 gm IV q12h (over 4 hours) - f/u with cultures, clinical status, and HD schedule  Sayana Salley P 12/25/2014,2:41 PM

## 2014-12-25 NOTE — Progress Notes (Signed)
Critical lactic acid 2.2 called to Dr Anselm Jungling. Needs repeat lactic acid per original order. Confirmed with Dr Anselm Jungling. Order was completed by the lab because 1st specimen was collected improperly. Reordered for 2nd result

## 2014-12-25 NOTE — Progress Notes (Signed)
Dr Lavetta Nielsen notified blood cultures positive both bottles for gram - rods. No orders received. Pt on zosyn.

## 2014-12-25 NOTE — ED Notes (Signed)
Family at bedside. 

## 2014-12-25 NOTE — Plan of Care (Signed)
Problem: Phase I Progression Outcomes Goal: Pain controlled with appropriate interventions Outcome: Not Applicable Date Met:  25/48/62 Pt has no pain Goal: Voiding-avoid urinary catheter unless indicated Outcome: Not Applicable Date Met:  82/41/75 PT anuric

## 2014-12-25 NOTE — ED Notes (Signed)
Pt's wife reports seen in ED x6 days ago for n/v/d; states n/v/d resolved; however, pt has gotten progressively worse - pt appears ill - slow to respond - states feels "ok"

## 2014-12-25 NOTE — ED Notes (Signed)
Patient is resting comfortably. 

## 2014-12-25 NOTE — ED Notes (Signed)
Vital signs stable. 

## 2014-12-25 NOTE — H&P (Addendum)
[facility] [place] NAME: Tarquin Welcher    MR#:  [id number]  DATE OF BIRTH:  1951/04/27  DATE OF ADMISSION:  12/25/2014  PRIMARY CARE PHYSICIAN: Miguel Aschoff, MD   REQUESTING/REFERRING PHYSICIAN: Lavonia Drafts MD  CHIEF COMPLAINT:   Chief Complaint  Patient presents with  . Code Sepsis    nausea and vomiting x 1 week     HISTORY OF PRESENT ILLNESS: Dao Memmott  is a 64 y.o. male with a known history of end-stage renal disease on hemodialysis Monday/Wednesday/Friday, history of diabetes medication controlled with diet, history of CVA on full dose aspirin, patient presents with complaints of weakness, cough, shortness of breath, and confusion, patient was in [facility] ED for 2 days, complaining of nausea, vomiting, thought to be secondary to gastroenteritis, patient was discharged home, presented with above complaints, the patient was septic with leukocytosis of 20,000, fever of 103, chest x-ray showing right lung basal haziness suspicious for aspiration pneumonia, patient was started on IV vancomycin and Zosyn in ED after blood cultures were obtained, blood pressure was on the lower side, received gentle hydration, hospitalist requested to admit the patient.  PAST MEDICAL HISTORY:   Past Medical History  Diagnosis Date  . Renal disorder   . Diabetes mellitus without complication     PAST SURGICAL HISTORY: No past surgical history on file.  SOCIAL HISTORY:  History  Substance Use Topics  . Smoking status: Former Research scientist (life sciences)  . Smokeless tobacco: Not on file  . Alcohol Use: No    FAMILY HISTORY: Father is deceased, had diabetes. Mother is alive and healthy. Diabetes does seem to run in the family.   DRUG ALLERGIES: No Known Allergies  REVIEW OF SYSTEMS:   CONSTITUTIONAL: Patient afebrile, complains of fatigue and weakness EYES: No blurred or double vision.  EARS, NOSE, AND THROAT: No tinnitus or ear pain.  RESPIRATORY:  Reports cough, shortness of breath, productive sputum CARDIOVASCULAR: No chest pain, orthopnea, edema.  GASTROINTESTINAL: Complaints of nausea and vomiting for 2 days, currently resolved GENITOURINARY: No dysuria, hematuria.  ENDOCRINE: No polyuria, nocturia,  HEMATOLOGY: No anemia, easy bruising or bleeding SKIN: No rash or lesion. MUSCULOSKELETAL: No joint pain or arthritis.   NEUROLOGIC: No tingling, numbness, weakness, initially confused per wife, currently improved. PSYCHIATRY: No anxiety or depression.   MEDICATIONS AT HOME:  Prior to Admission medications   Medication Sig Start Date End Date Taking? Authorizing Provider  aspirin 325 MG tablet Take 325 mg by mouth daily.   Yes Historical Provider, MD  sevelamer (RENAGEL) 800 MG tablet Take 3,200 mg by mouth 3 (three) times daily with meals.   Yes Historical Provider, MD  docusate sodium (COLACE) 100 MG capsule Take 1 capsule (100 mg total) by mouth 2 (two) times daily. 12/23/14 12/23/15  Lavonia Drafts, MD      PHYSICAL EXAMINATION:   VITAL SIGNS: Blood pressure 110/60, pulse 89, temperature 101.2 F (38.4 C), temperature source Other (Comment), resp. rate 24, height 5\' 8"  (1.727 m), weight 81.647 kg (180 lb), SpO2 97 %.  GENERAL:  65 y.o.-year-old patient lying in the bed , frail, ill-appearing EYES: Pupils equal, round, reactive to light and accommodation. No scleral icterus. Extraocular muscles intact.  HEENT: Head atraumatic, normocephalic. Oropharynx and nasopharynx clear.  NECK:  Supple, no jugular venous distention. No thyroid enlargement, no tenderness.  LUNGS: Normal breath sounds bilaterally, no wheezing, bibasilar Rales. No use of accessory muscles of respiration.  CARDIOVASCULAR: S1, S2 normal. No  murmurs, rubs, or gallops.  ABDOMEN: Soft, nontender, nondistended. Bowel sounds present. No organomegaly or mass.  EXTREMITIES: No pedal edema, cyanosis, or clubbing.  NEUROLOGIC: Cranial nerves II through XII are intact.  Muscle strength 5/5 in all extremities. Sensation intact. Gait not checked.  PSYCHIATRIC: The patient is alert and oriented  SKIN: No obvious rash, lesion, or ulcer.   LABORATORY PANEL:   CBC  Recent Labs Lab 12/23/14 1055 12/25/14 0735  WBC 6.0 20.4*  HGB 10.3* 9.2*  HCT 32.2* 29.5*  PLT 252 150  MCV 87.3 87.7  MCH 27.8 27.4  MCHC 31.9* 31.3*  RDW 15.6* 15.8*  LYMPHSABS 0.8* 0.5*  MONOABS 0.3 1.2*  EOSABS 0.0 0.0  BASOSABS 0.1 0.0   ------------------------------------------------------------------------------------------------------------------  Chemistries   Recent Labs Lab 12/23/14 1055 12/25/14 0735  NA 138 139  K 3.3* 3.6  CL 92* 94*  CO2 32 30  GLUCOSE 159* 212*  BUN 27* 38*  CREATININE 8.33* 9.25*  CALCIUM 9.1 8.6*  AST 16 113*  ALT 15* 62  ALKPHOS 92 106  BILITOT 1.3* 6.4*   ------------------------------------------------------------------------------------------------------------------ estimated creatinine clearance is 8.1 mL/min (by C-G formula based on Cr of 9.25). ------------------------------------------------------------------------------------------------------------------ No results for input(s): TSH, T4TOTAL, T3FREE, THYROIDAB in the last 72 hours.  Invalid input(s): FREET3   Coagulation profile No results for input(s): INR, PROTIME in the last 168 hours. ------------------------------------------------------------------------------------------------------------------- No results for input(s): DDIMER in the last 72 hours. -------------------------------------------------------------------------------------------------------------------  Cardiac Enzymes No results for input(s): CKMB, TROPONINI, MYOGLOBIN in the last 168 hours.  Invalid input(s): CK ------------------------------------------------------------------------------------------------------------------ Invalid input(s):  POCBNP  ---------------------------------------------------------------------------------------------------------------  Urinalysis No results found for: COLORURINE, APPEARANCEUR, LABSPEC, PHURINE, GLUCOSEU, HGBUR, BILIRUBINUR, KETONESUR, PROTEINUR, UROBILINOGEN, NITRITE, LEUKOCYTESUR   RADIOLOGY: Dg Chest Port 1 View  12/25/2014   CLINICAL DATA:  Two normal 80s. Fever. Nausea vomiting for 1 week. Code sepsis.  EXAM: PORTABLE CHEST - 1 VIEW  COMPARISON:  05/30/2013  FINDINGS: Lung volumes are relatively low. There is hazy opacity at the right lung base which is likely atelectasis. Pneumonia is possible. Lungs otherwise clear. No pleural effusion or pneumothorax.  Cardiac silhouette normal in size and configuration. No mediastinal or hilar masses.  Bony thorax is demineralized but grossly intact.  IMPRESSION: 1. Area of hazy opacity at the right lung base, most likely atelectasis. Pneumonia is possible. 2. No other evidence of acute cardiopulmonary disease.   Electronically Signed   By: Lajean Manes M.D.   On: 12/25/2014 08:31    EKG: No orders found for this or any previous visit.  IMPRESSION AND PLAN:  Sepsis - This is secondary to aspiration pneumonia, patient meets sepsis criteria on admission given his fever, leukocytosis, tachycardia and hypotension. - Follow on septic workup. - if BP continue to drop- start levophed.  Aspiration pneumonia/HCAP - Patient will be admitted for aspiration pneumonia, chest x-ray significant for right basal haziness, most likely related to his recent nausea and vomiting. - Blood cultures were sent, patient will be started on IV vancomycin and Zosyn, pharmacy to dose  - Pulmonary toilet, flutter valve, nebs, Mucinex - Given patient is febrile, will check influenza panel - Will admit patient to step down giving his blood pressure on the lower side.  End-stage renal disease - Patient received his hemodialysis yesterday, he is on Monday/Wednesday/Friday,  well consult nephrology to resume hemodialysis - Continue with Renagel and renal diet.  Elevated Bilirubin - Ultrasound RUQ - already on zosyn for pneumonia.  History of CVA - Denies any new focal  deficits, will continue on aspirin.  Diabetes mellitus - Appears to be controlled with diet, not taking any home medication, will monitor CBGs, if needed will start on insulin sliding scale   All the records are reviewed and case discussed with ED provider. Management plans discussed with the patient, family and they are in agreement.  CODE STATUS: Full code    TOTAL TIME TAKING CARE OF THIS PATIENT: critical care 55 minutes.    Vaughan Basta M.D on 12/25/2014 at 11:27 AM  Between 7am to 6pm - Pager - [phone]  After 6pm go to [email or web address] - password [email or web address]  Office  [phone]  CC: Primary care physician; Miguel Aschoff, MD

## 2014-12-26 LAB — CBC
HEMATOCRIT: 24.5 % — AB (ref 40.0–52.0)
HEMOGLOBIN: 7.9 g/dL — AB (ref 13.0–18.0)
MCH: 27.8 pg (ref 26.0–34.0)
MCHC: 32.3 g/dL (ref 32.0–36.0)
MCV: 86 fL (ref 80.0–100.0)
Platelets: 110 10*3/uL — ABNORMAL LOW (ref 150–440)
RBC: 2.84 MIL/uL — AB (ref 4.40–5.90)
RDW: 16.2 % — ABNORMAL HIGH (ref 11.5–14.5)
WBC: 11.1 10*3/uL — AB (ref 3.8–10.6)

## 2014-12-26 LAB — FERRITIN: Ferritin: 2488 ng/mL — ABNORMAL HIGH (ref 24–336)

## 2014-12-26 LAB — COMPREHENSIVE METABOLIC PANEL
ALBUMIN: 2.2 g/dL — AB (ref 3.5–5.0)
ALK PHOS: 75 U/L (ref 38–126)
ALT: 62 U/L (ref 17–63)
AST: 75 U/L — ABNORMAL HIGH (ref 15–41)
Anion gap: 11 (ref 5–15)
BILIRUBIN TOTAL: 6.6 mg/dL — AB (ref 0.3–1.2)
BUN: 52 mg/dL — AB (ref 6–20)
CHLORIDE: 96 mmol/L — AB (ref 101–111)
CO2: 30 mmol/L (ref 22–32)
Calcium: 8.2 mg/dL — ABNORMAL LOW (ref 8.9–10.3)
Creatinine, Ser: 10.71 mg/dL — ABNORMAL HIGH (ref 0.61–1.24)
GFR calc Af Amer: 5 mL/min — ABNORMAL LOW (ref >60)
GFR calc non Af Amer: 5 mL/min — ABNORMAL LOW (ref >60)
Glucose, Bld: 121 mg/dL — ABNORMAL HIGH (ref 65–99)
POTASSIUM: 3.9 mmol/L (ref 3.5–5.1)
Sodium: 137 mmol/L (ref 135–145)
Total Protein: 5.3 g/dL — ABNORMAL LOW (ref 6.5–8.1)

## 2014-12-26 LAB — GLUCOSE, CAPILLARY
GLUCOSE-CAPILLARY: 121 mg/dL — AB (ref 70–99)
GLUCOSE-CAPILLARY: 154 mg/dL — AB (ref 70–99)
GLUCOSE-CAPILLARY: 170 mg/dL — AB (ref 70–99)
Glucose-Capillary: 165 mg/dL — ABNORMAL HIGH (ref 70–99)

## 2014-12-26 LAB — LACTIC ACID, PLASMA
Lactic Acid, Venous: 1.3 mmol/L (ref 0.5–2.0)
Lactic Acid, Venous: 1.5 mmol/L (ref 0.5–2.0)

## 2014-12-26 LAB — LACTATE DEHYDROGENASE: LDH: 162 U/L (ref 98–192)

## 2014-12-26 LAB — FOLATE: FOLATE: 14.2 ng/mL (ref >5.9)

## 2014-12-26 LAB — BILIRUBIN, DIRECT: Bilirubin, Direct: 4.6 mg/dL — ABNORMAL HIGH (ref 0.1–0.5)

## 2014-12-26 MED ORDER — SODIUM CHLORIDE 0.9 % IV SOLN: 100.0000 mL | INTRAVENOUS | Status: DC | PRN

## 2014-12-26 MED ORDER — SODIUM CHLORIDE 0.9 % IV SOLN: 100.0000 mL | Freq: Once | INTRAVENOUS | Status: DC

## 2014-12-26 MED ORDER — PNEUMOCOCCAL VAC POLYVALENT 25 MCG/0.5ML IJ INJ: 0.5000 mL | INJECTION | INTRAMUSCULAR | Status: DC

## 2014-12-26 MED ORDER — EPOETIN ALFA 10000 UNIT/ML IJ SOLN
10000.0000 [IU] | Freq: Once | INTRAMUSCULAR | Status: AC
  Administered 2014-12-27: 10000 [IU] via INTRAVENOUS
  Filled 2014-12-26: qty 1

## 2014-12-26 MED ORDER — EPOETIN ALFA 10000 UNIT/ML IJ SOLN
10000.0000 [IU] | Freq: Once | INTRAMUSCULAR | Status: DC
  Filled 2014-12-26: qty 1

## 2014-12-26 MED ORDER — ALBUTEROL SULFATE (2.5 MG/3ML) 0.083% IN NEBU
2.5000 mg | INHALATION_SOLUTION | Freq: Three times a day (TID) | RESPIRATORY_TRACT | Status: DC
  Administered 2014-12-27: 2.5 mg via RESPIRATORY_TRACT
  Filled 2014-12-26: qty 3

## 2014-12-26 NOTE — Progress Notes (Signed)
Breath sounds diminished throughout except LUL; Sat dropped to 87-88% on 2L, now requiring 4L [place] to maintain sat 91+. No cough heard, no dyspnea noted. Several episodes of sleep apnea noted at 06:12-613 with 20-30 second periods of apnea. Will notify MD in am.  Second set of blood cultures came back pos for gram ng rods and gram pos coccxi in both aerobic bottles, Dr Lavetta Nielsen notified, no orderes received.

## 2014-12-26 NOTE — Consult Note (Signed)
[facility]  CONSULT NOTE    Date: 12/26/2014                  Patient Name:  Luis Walter  MRN: [id number]  DOB: 1951-03-27  Age / Sex: 64 y.o., male         PCP: Miguel Aschoff, MD                 Service Requesting Consult: [facility]                 Reason for Consult: End Stage Renal Disease            History of Present Illness: Luis Walter is a 64 y.o. black male with End stage renal disease, hypertension, diabetes mellitus type II, CVA, coronary artery disease who presents to [facility] on 12/25/2014 for right lower lobe pneumonia. Patient admitted to CCU and placed on norepinephrine. Placed on broad spectrum antibiotics. Zosyn and vancomycin History taken from patient this morning. He is breathing nonlabored with [place] oxygen. He states he has been having increasing shortness of breath, coughing with production and nausea and vomiting going on for the last week. He endorses fevers and chills. Denies sick contacts. Patient states that he has been going to dialysis and staying entire treatment. Denies leaving treatment early.    Medications: Outpatient medications: Prescriptions prior to admission  Medication Sig Dispense Refill Last Dose  . aspirin 325 MG tablet Take 325 mg by mouth daily.   12/23/2014 at unknown  . sevelamer (RENAGEL) 800 MG tablet Take 3,200 mg by mouth 3 (three) times daily with meals.   12/23/2014 at unknown  . docusate sodium (COLACE) 100 MG capsule Take 1 capsule (100 mg total) by mouth 2 (two) times daily. 60 capsule 2 unkown at unkown    Current medications: Current Facility-Administered Medications  Medication Dose Route Frequency Provider Last Rate Last Dose  . acetaminophen (TYLENOL) tablet 650 mg  650 mg Oral Q6H PRN Vaughan Basta, MD       Or  . acetaminophen (TYLENOL) suppository 650 mg  650 mg Rectal Q6H PRN Vaughan Basta, MD      . albuterol (PROVENTIL) (2.5 MG/3ML) 0.083% nebulizer solution 2.5 mg  2.5  mg Nebulization Q6H Vaughan Basta, MD   2.5 mg at 12/26/14 0723  . albuterol (PROVENTIL) (2.5 MG/3ML) 0.083% nebulizer solution 2.5 mg  2.5 mg Nebulization Q2H PRN Vaughan Basta, MD      . aspirin tablet 325 mg  325 mg Oral Daily Vaughan Basta, MD   325 mg at 12/25/14 1554  . docusate sodium (COLACE) capsule 100 mg  100 mg Oral BID Vaughan Basta, MD   100 mg at 12/25/14 2156  . heparin injection 5,000 Units  5,000 Units Subcutaneous 3 times per day Vaughan Basta, MD   5,000 Units at 12/26/14 0607  . ipratropium (ATROVENT) nebulizer solution 0.5 mg  0.5 mg Nebulization Q6H Vaughan Basta, MD   0.5 mg at 12/26/14 0723  . norepinephrine (LEVOPHED) 4mg  in D5W 245mL premix infusion  0-40 mcg/min Intravenous Titrated Vaughan Basta, MD      . ondansetron (ZOFRAN) tablet 4 mg  4 mg Oral Q6H PRN Vaughan Basta, MD       Or  . ondansetron (ZOFRAN) injection 4 mg  4 mg Intravenous Q6H PRN Vaughan Basta, MD      . piperacillin-tazobactam (ZOSYN) IVPB 3.375 g  3.375 g Intravenous Q12H Anh P Pham, RPH   3.375 g  at 12/26/14 0518  . polyethylene glycol (MIRALAX / GLYCOLAX) packet 17 g  17 g Oral Daily PRN Vaughan Basta, MD      . sevelamer carbonate (RENVELA) tablet 3,200 mg  3,200 mg Oral TID WC Vaughan Basta, MD   3,200 mg at 12/26/14 0835  . [START ON 12/27/2014] vancomycin (VANCOCIN) IVPB 1000 mg/200 mL premix  1,000 mg Intravenous Q M,W,F-HD Anh P Pham, RPH          Allergies: No Known Allergies    Past Medical History: Past Medical History  Diagnosis Date  . Renal disorder   . Diabetes mellitus without complication      Past Surgical History: History reviewed. No pertinent past surgical history.   Family History: History reviewed. No pertinent family history.   Social History: History   Social History  . Marital Status: Married    Spouse Name: N/A  . Number of Children: N/A  . Years of Education: N/A    Occupational History  . Not on file.   Social History Main Topics  . Smoking status: Former Research scientist (life sciences)  . Smokeless tobacco: Not on file  . Alcohol Use: No  . Drug Use: Not on file  . Sexual Activity: Not on file   Other Topics Concern  . Not on file   Social History Narrative     Review of Systems: Review of Systems  Constitutional: Positive for fever, chills, malaise/fatigue and diaphoresis. Negative for weight loss.  HENT: Negative.  Negative for congestion, ear discharge, ear pain, hearing loss, nosebleeds, sore throat and tinnitus.   Eyes: Negative.  Negative for blurred vision, photophobia, pain, discharge and redness.  Respiratory: Positive for cough, sputum production, shortness of breath and wheezing. Negative for hemoptysis and stridor.   Cardiovascular: Negative.  Negative for chest pain, palpitations, orthopnea, claudication, leg swelling and PND.  Gastrointestinal: Positive for nausea and vomiting. Negative for heartburn, abdominal pain, diarrhea, constipation, blood in stool and melena.  Genitourinary: Negative.  Negative for dysuria, urgency, frequency, hematuria and flank pain.  Musculoskeletal: Positive for myalgias. Negative for back pain, joint pain, falls and neck pain.  Skin: Negative for itching and rash.  Neurological: Positive for weakness. Negative for dizziness, tingling, tremors, sensory change, speech change, focal weakness, seizures and headaches.  Endo/Heme/Allergies: Negative.  Negative for environmental allergies and polydipsia. Does not bruise/bleed easily.  Psychiatric/Behavioral: Negative for depression, suicidal ideas, hallucinations, memory loss and substance abuse. The patient is not nervous/anxious and does not have insomnia.     Vital Signs: Blood pressure 109/60, pulse 91, temperature 100.3 F (37.9 C), temperature source Oral, resp. rate 26, height 5\' 8"  (1.727 m), weight 85.2 kg (187 lb 13.3 oz), SpO2 97 %.  Weight trends: Filed Weights    12/25/14 0800 12/25/14 1221 12/26/14 0531  Weight: 81.647 kg (180 lb) 81.647 kg (180 lb) 85.2 kg (187 lb 13.3 oz)    Physical Exam: General: Critically ill  Head: Normocephalic, atraumatic. Moist oral mucosal membranes  Eyes: Anicteric, PERRL  Neck: Supple, trachea midline  Lungs:  Bilateral crackels, diminished on right  Heart: Regular rate and rhythm  Abdomen:  Soft, nontender,   Extremities:  no  peripheral edema.  Neurologic: Nonfocal, moving all four extremities  Skin: No lesions  Access: Left forarm AVF     Lab results: Basic Metabolic Panel:  Recent Labs Lab 12/23/14 1055 12/25/14 0735  NA 138 139  K 3.3* 3.6  CL 92* 94*  CO2 32 30  GLUCOSE 159* 212*  BUN  27* 38*  CREATININE 8.33* 9.25*  CALCIUM 9.1 8.6*    Liver Function Tests:  Recent Labs Lab 12/23/14 1055 12/25/14 0735  AST 16 113*  ALT 15* 62  ALKPHOS 92 106  BILITOT 1.3* 6.4*  PROT 8.0 6.7  ALBUMIN 3.6 2.9*    Recent Labs Lab 12/23/14 1055  LIPASE 31   No results for input(s): AMMONIA in the last 168 hours.  CBC:  Recent Labs Lab 12/23/14 1055 12/25/14 0735 12/26/14 0706  WBC 6.0 20.4* 11.1*  NEUTROABS 4.8 18.6*  --   HGB 10.3* 9.2* 7.9*  HCT 32.2* 29.5* 24.5*  MCV 87.3 87.7 86.0  PLT 252 150 110*    Cardiac Enzymes: No results for input(s): CKTOTAL, CKMB, CKMBINDEX, TROPONINI in the last 168 hours.  BNP: Invalid input(s): POCBNP  CBG:  Recent Labs Lab 12/25/14 1320 12/25/14 1708 12/25/14 2058 12/26/14 0709  GLUCAP 193* 182* 194* 121*    Microbiology: Results for orders placed or performed during the hospital encounter of 12/25/14  Blood Culture (routine x 2)     Status: None (Preliminary result)   Collection Time: 12/25/14  7:35 AM  Result Value Ref Range Status   Specimen Description BLOOD  Final   Special Requests NONE  Final   Culture   Final    GRAM NEGATIVE RODS IN BOTH AEROBIC AND ANAEROBIC BOTTLES CRITICAL RESULT CALLED TO, READ BACK BY AND  VERIFIED WITH: [employer] ALLEN AT 2332 12/25/14 [facility] GRAM POSITIVE COCCI AEROBIC BOTTLE ONLY    Report Status PENDING  Incomplete  Blood Culture (routine x 2)     Status: None (Preliminary result)   Collection Time: 12/25/14  7:35 AM  Result Value Ref Range Status   Specimen Description BLOOD  Final   Special Requests NONE  Final   Culture NO GROWTH 1 DAY  Final   Report Status PENDING  Incomplete  Influenza A&B Antigens [facility])     Status: None   Collection Time: 12/25/14 12:47 PM  Result Value Ref Range Status   Influenza A ([facility]) NEGATIVE  Final   Influenza B ([facility]) NEGATIVE  Final    Coagulation Studies: No results for input(s): LABPROT, INR in the last 72 hours.  Urinalysis: No results for input(s): COLORURINE, LABSPEC, PHURINE, GLUCOSEU, HGBUR, BILIRUBINUR, KETONESUR, PROTEINUR, UROBILINOGEN, NITRITE, LEUKOCYTESUR in the last 72 hours.  Invalid input(s): APPERANCEUR    Imaging: Dg Chest Port 1 View  12/25/2014   CLINICAL DATA:  Two normal 80s. Fever. Nausea vomiting for 1 week. Code sepsis.  EXAM: PORTABLE CHEST - 1 VIEW  COMPARISON:  05/30/2013  FINDINGS: Lung volumes are relatively low. There is hazy opacity at the right lung base which is likely atelectasis. Pneumonia is possible. Lungs otherwise clear. No pleural effusion or pneumothorax.  Cardiac silhouette normal in size and configuration. No mediastinal or hilar masses.  Bony thorax is demineralized but grossly intact.  IMPRESSION: 1. Area of hazy opacity at the right lung base, most likely atelectasis. Pneumonia is possible. 2. No other evidence of acute cardiopulmonary disease.   Electronically Signed   By: Lajean Manes M.D.   On: 12/25/2014 08:31   US Abdomen Limited Ruq  12/25/2014   CLINICAL DATA:  Elevated bilirubin.  Cholecystitis.  EXAM: US ABDOMEN LIMITED - RIGHT UPPER QUADRANT  COMPARISON:  None.  FINDINGS: Gallbladder:  Cholelithiasis and biliary sludge is present. The largest stone measures 7 mm. Some of the stones  appear non mobile despite repositioning. The gallbladder wall is striated and measures  6 mm which is thickened. No sonographic Percell Miller sign is present.  Common bile duct:  Diameter: 6 mm, within normal limits for age.  Liver:  No focal lesion identified. Within normal limits in parenchymal echogenicity.  RIGHT pleural effusion incidentally noted.  IMPRESSION: Biliary sludge in stones with nonspecific gallbladder wall thickening. In the absence of a sonographic Murphy sign, the significance is unclear. The sonographic findings are compatible with cholecystitis which may be chronic. In a patient with elevated bilirubin, consider HIDA scan for further assessment.   Electronically Signed   By: Dereck Ligas M.D.   On: 12/25/2014 15:37      Assessment & Plan: Luis Walter is a 64 y.o. black male with End stage renal disease, hypertension, diabetes mellitus type II, CVA, coronary artery disease who presents to [facility] on 5/7/2016for right lower lobe pneumonia  MWF [facility] [place]   1. End Stage Renal Disease: MWF. No acute indication for dialysis. Will schedule patient for dialysis on Monday. Orders prepared.  - Continue to monitor volume status and electrolytes.   2. Pneumonia with sepsis: placed on norepinephrine overnight. Now off. Empiric antibiotics with zosyn and vancomycin. Leukocytosis has improved.  Continues to having fevers.   3. Anemia of chronic kidney disease: hemoglobin 7.9 - epo with HD treatment   4. Secondary Hyperparathyroidism:  - Check PTH, phos - sevelamer with meals.

## 2014-12-26 NOTE — Progress Notes (Signed)
[facility] [place] [name]    DATE OF ADMISSION:  12/25/2014  S:  Pt presented with abdominal pain , noted to have elevated billirubin also respiratory difficulties, cxr with aspiration pna. This morning patient reports his abdominal pain is improved. He is still requiring oxygen therapy. He was seen by surgery they did not feel that there is anything surgically then is to be done. Recommended GI evaluation. Also to note his hemoglobin has dropped compared to the admission hemoglobin.      REVIEW OF SYSTEMS:   CONSTITUTIONAL: Patient afebrile, complains of fatigue and weakness EYES: No blurred or double vision.  EARS, NOSE, AND THROAT: No tinnitus or ear pain.  RESPIRATORY: Reports cough, shortness of breath, productive sputum CARDIOVASCULAR: No chest pain, orthopnea, edema.  GASTROINTESTINAL: Complaints of nausea and vomiting for 2 days, currently resolved, abdominal pain GENITOURINARY: No dysuria, hematuria.  ENDOCRINE: No polyuria, nocturia,  HEMATOLOGY: No anemia, easy bruising or bleeding SKIN: No rash or lesion. MUSCULOSKELETAL: No joint pain or arthritis.   NEUROLOGIC: No tingling, numbness, weakness, initially confused per wife, currently improved. PSYCHIATRY: No anxiety or depression.   MEDICATIONS AT HOME:  Prior to Admission medications   Medication Sig Start Date End Date Taking? Authorizing Provider  aspirin 325 MG tablet Take 325 mg by mouth daily.   Yes Historical Provider, MD  sevelamer (RENAGEL) 800 MG tablet Take 3,200 mg by mouth 3 (three) times daily with meals.   Yes Historical Provider, MD  docusate sodium (COLACE) 100 MG capsule Take 1 capsule (100 mg total) by mouth 2 (two) times daily. 12/23/14 12/23/15  Lavonia Drafts, MD      PHYSICAL EXAMINATION:   VITAL SIGNS: Blood pressure 109/60, pulse 91, temperature 100.3 F (37.9 C), temperature source Oral, resp. rate 26, height 5\' 8"  (1.727 m), weight  85.2 kg (187 lb 13.3 oz), SpO2 97 %.  GENERAL:  64 y.o.-year-old patient lying in the bed , frail, ill-appearing EYES: Pupils equal, round, reactive to light and accommodation. No scleral icterus. Extraocular muscles intact.  HEENT: Head atraumatic, normocephalic. Oropharynx and nasopharynx clear.  NECK:  Supple, no jugular venous distention. No thyroid enlargement, no tenderness.  LUNGS: Normal breath sounds bilaterally, no wheezing, bibasilar Rales. No use of accessory muscles of respiration.  CARDIOVASCULAR: S1, S2 normal. No murmurs, rubs, or gallops.  ABDOMEN: Soft, nontender, nondistended. Bowel sounds present. No organomegaly or mass.  EXTREMITIES: No pedal edema, cyanosis, or clubbing.  NEUROLOGIC: Cranial nerves II through XII are intact. Muscle strength 5/5 in all extremities. Sensation intact. Gait not checked.  PSYCHIATRIC: The patient is alert and oriented  SKIN: No obvious rash, lesion, or ulcer.   LABORATORY PANEL:   CBC  Recent Labs Lab 12/23/14 1055 12/25/14 0735 12/26/14 0706  WBC 6.0 20.4* 11.1*  HGB 10.3* 9.2* 7.9*  HCT 32.2* 29.5* 24.5*  PLT 252 150 110*  MCV 87.3 87.7 86.0  MCH 27.8 27.4 27.8  MCHC 31.9* 31.3* 32.3  RDW 15.6* 15.8* 16.2*  LYMPHSABS 0.8* 0.5*  --   MONOABS 0.3 1.2*  --   EOSABS 0.0 0.0  --   BASOSABS 0.1 0.0  --    ------------------------------------------------------------------------------------------------------------------  Chemistries   Recent Labs Lab 12/23/14 1055 12/25/14 0735  NA 138 139  K 3.3* 3.6  CL 92* 94*  CO2 32 30  GLUCOSE 159* 212*  BUN 27* 38*  CREATININE 8.33* 9.25*  CALCIUM 9.1 8.6*  AST 16 113*  ALT 15* 62  ALKPHOS 92 106  BILITOT 1.3* 6.4*   ------------------------------------------------------------------------------------------------------------------ estimated creatinine clearance is 8.9 mL/min (by C-G formula based on Cr of  9.25). ------------------------------------------------------------------------------------------------------------------ No results for input(s): TSH, T4TOTAL, T3FREE, THYROIDAB in the last 72 hours.  Invalid input(s): FREET3   Coagulation profile No results for input(s): INR, PROTIME in the last 168 hours. ------------------------------------------------------------------------------------------------------------------- No results for input(s): DDIMER in the last 72 hours. -------------------------------------------------------------------------------------------------------------------  Cardiac Enzymes No results for input(s): CKMB, TROPONINI, MYOGLOBIN in the last 168 hours.  Invalid input(s): CK ------------------------------------------------------------------------------------------------------------------ Invalid input(s): POCBNP  ---------------------------------------------------------------------------------------------------------------  Urinalysis No results found for: COLORURINE, APPEARANCEUR, LABSPEC, PHURINE, GLUCOSEU, HGBUR, BILIRUBINUR, KETONESUR, PROTEINUR, UROBILINOGEN, NITRITE, LEUKOCYTESUR   RADIOLOGY: Dg Chest Port 1 View  12/25/2014   CLINICAL DATA:  Two normal 80s. Fever. Nausea vomiting for 1 week. Code sepsis.  EXAM: PORTABLE CHEST - 1 VIEW  COMPARISON:  05/30/2013  FINDINGS: Lung volumes are relatively low. There is hazy opacity at the right lung base which is likely atelectasis. Pneumonia is possible. Lungs otherwise clear. No pleural effusion or pneumothorax.  Cardiac silhouette normal in size and configuration. No mediastinal or hilar masses.  Bony thorax is demineralized but grossly intact.  IMPRESSION: 1. Area of hazy opacity at the right lung base, most likely atelectasis. Pneumonia is possible. 2. No other evidence of acute cardiopulmonary disease.   Electronically Signed   By: Lajean Manes M.D.   On: 12/25/2014 08:31   US Abdomen Limited  Ruq  12/25/2014   CLINICAL DATA:  Elevated bilirubin.  Cholecystitis.  EXAM: US ABDOMEN LIMITED - RIGHT UPPER QUADRANT  COMPARISON:  None.  FINDINGS: Gallbladder:  Cholelithiasis and biliary sludge is present. The largest stone measures 7 mm. Some of the stones appear non mobile despite repositioning. The gallbladder wall is striated and measures 6 mm which is thickened. No sonographic Percell Miller sign is present.  Common bile duct:  Diameter: 6 mm, within normal limits for age.  Liver:  No focal lesion identified. Within normal limits in parenchymal echogenicity.  RIGHT pleural effusion incidentally noted.  IMPRESSION: Biliary sludge in stones with nonspecific gallbladder wall thickening. In the absence of a sonographic Murphy sign, the significance is unclear. The sonographic findings are compatible with cholecystitis which may be chronic. In a patient with elevated bilirubin, consider HIDA scan for further assessment.   Electronically Signed   By: Dereck Ligas M.D.   On: 12/25/2014 15:37    EKG: No orders found for this or any previous visit.  IMPRESSION AND PLAN:  Sepsis - Secondary to aspiration pneumonia,   Continue IV vancomycin and Zosyn. His blood pressure is currently stable we will stop the Levophed. - Aspiration pneumonia/HCAP - Pulmonary toilet, flutter valve, nebs, Mucinex -  End-stage renal disease Nephrology consult to resume his hemodialysis - Continue with Renagel and renal diet.  Elevated Bilirubin - Ultrasound RUQ which shows some gallbladder sludge but the common bile duct size is normal. We will ask GI to see for further recommendations also in light of his anemia I will check LDH and haptoglobin to make sure he is not hemolyzing causing elevated bilirubin.   History of CVA - Denies any new focal deficits, will continue on aspirin.  Diabetes mellitus - Appears to be controlled with diet, not taking any home medication, will monitor CBGs, if needed will start on insulin  sliding scale  Anemia- I would check his iron level ferritin level B-XII and folate guaiac his stools also check LDH and haptoglobin globulin  make sure he is not hemolyzing    TOTAL TIME TAKING CARE OF THIS PATIENT: 38min  Charlann Wayne M.D on 12/26/2014 at 12:55 PM  Between 7am to 6pm - Pager - [phone]  After 6pm go to [email or web address] - password [email or web address] [facility]  Office  [phone]  CC: Primary care physician; Miguel Aschoff, MD

## 2014-12-26 NOTE — Consult Note (Signed)
GI Inpatient Consult Note Lollie Sails MD  Reason for Consult: Abnormal liver enzymes and anemia   Attending Requesting Consult:Shreyang Posey Pronto, MD  Outpatient Primary Physician:   History of Present Illness: Luis Walter is a 64 y.o. male who was admitted on 12/25/2014 with a diagnosis of sepsis and aspiration pneumonia. He was noted on his evaluation to have increased bilirubin as well as anemia. Much the history is obtained from his wife who is present with him.  She had an episode of fever last Monday night and intermittently occurring since then. And I have nausea and emesis Wednesday evening. This continued through his admission however he is currently feeling very much better. He had come to the emergency room at [facility] 2 days ago and was told he had a virus was hydrated and sent home. He has not had any abdominal pain. He has had a cough but at times has been productive.  He denies any heartburn or dysphagia. He does have problems with constipation and takes Colace may have a bowel movement twice a week. No blood in the stool black bowel movements are slimy stools.  Does not take a proton pump inhibitor however does take a 325 mg aspirin every evening because of his history of TIA.  Patient had a colonoscopy on 12/28/2008 with multiple colon polyps removed more of these being over 1 cm in size with histology of tubulovillous adenoma. Has not had a repeat colonoscopy since then. He has apparently never had an EGD.  Liver history: She has not had any alcohol since the late 1970s. He was never a heavy drinker. He has no tattoos. Never had an episode of jaundice in the past for history of hepatitis. He has had no exposures to toxins that he is aware of. He has worked in the [employer]. There is no family history of liver disease. He has never been in the [employer], there has been no foreign travel, no history of incarceration. He has never had a blood  transfusion.  Past Medical History:  Past Medical History  Diagnosis Date  . Renal disorder   . Diabetes mellitus without complication     Problem List: Patient Active Problem List   Diagnosis Date Noted  . Pneumonia 12/25/2014  . ESRD (end stage renal disease) 12/25/2014  . DM (diabetes mellitus) 12/25/2014  . CVA (cerebral infarction) 12/25/2014    Past Surgical History: History reviewed. No pertinent past surgical history.  Allergies: No Known Allergies  Home Medications: Prescriptions prior to admission  Medication Sig Dispense Refill Last Dose  . aspirin 325 MG tablet Take 325 mg by mouth daily.   12/23/2014 at unknown  . sevelamer (RENAGEL) 800 MG tablet Take 3,200 mg by mouth 3 (three) times daily with meals.   12/23/2014 at unknown  . docusate sodium (COLACE) 100 MG capsule Take 1 capsule (100 mg total) by mouth 2 (two) times daily. 60 capsule 2 unkown at [facility] medication reconciliation was completed with the patient.   Scheduled Inpatient Medications:   . sodium chloride  100 mL Intravenous Once  . albuterol  2.5 mg Nebulization Q6H  . [START ON 12/27/2014] epoetin (EPOGEN/PROCRIT) injection  10,000 Units Intravenous Once  . [START ON 12/27/2014] epoetin (EPOGEN/PROCRIT) injection  10,000 Units Intravenous Once  . heparin  5,000 Units Subcutaneous 3 times per day  . ipratropium  0.5 mg Nebulization Q6H  . piperacillin-tazobactam (ZOSYN)  IV  3.375 g Intravenous Q12H  .  sevelamer carbonate  3,200 mg Oral TID WC  . [START ON 12/27/2014] vancomycin  1,000 mg Intravenous Q M,W,F-HD    Continuous Inpatient Infusions:     PRN Inpatient Medications:  [START ON 12/27/2014] sodium chloride, [START ON 12/27/2014] sodium chloride, [START ON 12/27/2014] sodium chloride, acetaminophen **OR** acetaminophen, albuterol, ondansetron **OR** ondansetron (ZOFRAN) IV, polyethylene glycol  Family History: family history is not on file.   GI Family History: Negative for colorectal cancer  liver disease or ulcers  Social History:   reports that he has quit smoking. He does not have any smokeless tobacco history on file. He reports that he does not drink alcohol. The patient denies ETOH, tobacco, or drug use.   ROS  Review of Systems: View of systems per admission history and physical agree with same  Physical Examination: BP 112/72 mmHg  Pulse 81  Temp(Src) 98 F (36.7 C) (Oral)  Resp 18  Ht 5\' 8"  (1.727 m)  Wt 85.2 kg (187 lb 13.3 oz)  BMI 28.57 kg/m2  SpO2 59% Gen: 64 year old Afro-American male in no acute distress and responses. She is wife states this is baseline. HEENT: Normocephalic atraumatic eyes show scleral icterus Neck: No JVD Chest: Mild coarse rhonchi and breath sounds bilateral bases CV: Regular rate and rhythm Abd: Soft mildly protuberant. Nontender nondistended bowel sounds are positive and normoactive. Ext: Trace lower extremity edema Skin: Other:  Data: Lab Results  Component Value Date   WBC 11.1* 12/26/2014   HGB 7.9* 12/26/2014   HCT 24.5* 12/26/2014   MCV 86.0 12/26/2014   PLT 110* 12/26/2014    Recent Labs Lab 12/23/14 1055 12/25/14 0735 12/26/14 0706  HGB 10.3* 9.2* 7.9*   Lab Results  Component Value Date   NA 137 12/26/2014   K 3.9 12/26/2014   CL 96* 12/26/2014   CO2 30 12/26/2014   BUN 52* 12/26/2014   CREATININE 10.71* 12/26/2014   Lab Results  Component Value Date   ALT 62 12/26/2014   AST 75* 12/26/2014   ALKPHOS 75 12/26/2014   BILITOT 6.6* 12/26/2014   No results for input(s): APTT, INR, PTT in the last 168 hours. CBC Latest Ref Rng 12/26/2014 12/25/2014 12/23/2014  WBC 3.8 - 10.6 K/uL 11.1(H) 20.4(H) 6.0  Hemoglobin 13.0 - 18.0 g/dL 7.9(L) 9.2(L) 10.3(L)  Hematocrit 40.0 - 52.0 % 24.5(L) 29.5(L) 32.2(L)  Platelets 150 - 440 K/uL 110(L) 150 252    STUDIES: Dg Chest Port 1 View  12/25/2014   CLINICAL DATA:  Two normal 80s. Fever. Nausea vomiting for 1 week. Code sepsis.  EXAM: PORTABLE CHEST - 1 VIEW   COMPARISON:  05/30/2013  FINDINGS: Lung volumes are relatively low. There is hazy opacity at the right lung base which is likely atelectasis. Pneumonia is possible. Lungs otherwise clear. No pleural effusion or pneumothorax.  Cardiac silhouette normal in size and configuration. No mediastinal or hilar masses.  Bony thorax is demineralized but grossly intact.  IMPRESSION: 1. Area of hazy opacity at the right lung base, most likely atelectasis. Pneumonia is possible. 2. No other evidence of acute cardiopulmonary disease.   Electronically Signed   By: Lajean Manes M.D.   On: 12/25/2014 08:31   US Abdomen Limited Ruq  12/25/2014   CLINICAL DATA:  Elevated bilirubin.  Cholecystitis.  EXAM: US ABDOMEN LIMITED - RIGHT UPPER QUADRANT  COMPARISON:  None.  FINDINGS: Gallbladder:  Cholelithiasis and biliary sludge is present. The largest stone measures 7 mm. Some of the stones appear non mobile despite repositioning. The  gallbladder wall is striated and measures 6 mm which is thickened. No sonographic Percell Miller sign is present.  Common bile duct:  Diameter: 6 mm, within normal limits for age.  Liver:  No focal lesion identified. Within normal limits in parenchymal echogenicity.  RIGHT pleural effusion incidentally noted.  IMPRESSION: Biliary sludge in stones with nonspecific gallbladder wall thickening. In the absence of a sonographic Murphy sign, the significance is unclear. The sonographic findings are compatible with cholecystitis which may be chronic. In a patient with elevated bilirubin, consider HIDA scan for further assessment.   Electronically Signed   By: Dereck Ligas M.D.   On: 12/25/2014 15:37     Assessment: 1. Abnormal liver associated enzymes. These spiked fairly rapidly over a day or 2. Show evidence of gallbladder sludge as well as some small stones and gallbladder wall thickening. Common bile duct is 6 mm, considered normal, however she is whether he may have passed a small stone. This is likely not  reactive hepatopathy in regards to his pneumonia. Of note patient has no right upper quadrant pain. This would make it much less likely ascending cholangitis. He is currently on a IV antibiotic which will cover these issues. 2. Anemia. Likely multifactorial in the setting of known history of anemia of chronic disease secondary to his chronic renal failure. Has been no evidence of GI bleeding from history. He does take a high-dose aspirin daily and does not take her prophylaxis. He is at higher risk for peptic ulcer disease due to his renal history. He has a history of multiple colon polyps as noted above and is 2 years overdue for colonoscopy follow-up screening protocols.  Recommendations: 1. Agree with hepatobiliary scan. 2. Would recommend an EGD and colonoscopy as clinically feasible. Currently with his pneumonia likely delay evaluation. 3. Would start proton pump inhibitor once daily as ulcer prophylaxis. 4. Labs to include recheck of LFTs in a.m., ammonia, hepatitis C antibody. It is noted that hepatitis B serologies have been drawn. 5. Will follow with you  Thank you for the consult. Please call with questions or concerns.  Lollie Sails, MD  12/26/2014 8:17 PM

## 2014-12-26 NOTE — Progress Notes (Signed)
Mr. Huguley is alert and oriented. VSS. Febrile. Physician aware of hgb of 7.96 and pos. Blood cultures. No new orders. Up to [facility]. Bath given. Transferred to room 201. Report given to receiving nurse.

## 2014-12-26 NOTE — Consult Note (Addendum)
Rapid increase in bilirubin associated with significant leukocytosis and gram-negative rod bacteremia is most consistent with ascending cholangitis, despite the common bile duct diameter of 6 mm. HIDA scan may be unhelpful given the hyperbilirubinemia. Acute care surgery service will remain active as a "safety net," but this problem is primarily cared for by gastroenterologists.

## 2014-12-26 NOTE — Progress Notes (Signed)
ANTIBIOTIC CONSULT NOTE - INITIAL  Pharmacy Consult for vancomycin and zosyn Indication: pneumonia and rule out sepsis  No Known Allergies  Patient Measurements: Height: 5\' 8"  (172.7 cm) Weight: 187 lb 13.3 oz (85.2 kg) IBW/kg (Calculated) : 68.4   Vital Signs: Temp: 100.3 F (37.9 C) (05/08 0747) BP: 109/60 mmHg (05/08 0747) Pulse Rate: 91 (05/08 0747) Intake/Output from previous day: 05/07 0701 - 05/08 0700 In: 9371 [P.O.:240; I.V.:2750; IV Piggyback:450] Out: -  Intake/Output from this shift:    Labs:  Recent Labs  12/25/14 0735 12/26/14 0706  WBC 20.4* 11.1*  HGB 9.2* 7.9*  PLT 150 110*  CREATININE 9.25*  --    Estimated Creatinine Clearance: 8.9 mL/min (by C-G formula based on Cr of 9.25). No results for input(s): VANCOTROUGH, VANCOPEAK, VANCORANDOM, GENTTROUGH, GENTPEAK, GENTRANDOM, TOBRATROUGH, TOBRAPEAK, TOBRARND, AMIKACINPEAK, AMIKACINTROU, AMIKACIN in the last 72 hours.   Microbiology: Recent Results (from the past 720 hour(s))  Blood Culture (routine x 2)     Status: None (Preliminary result)   Collection Time: 12/25/14  7:35 AM  Result Value Ref Range Status   Specimen Description BLOOD  Final   Special Requests NONE  Final   Culture   Final    GRAM NEGATIVE RODS IN BOTH AEROBIC AND ANAEROBIC BOTTLES CRITICAL RESULT CALLED TO, READ BACK BY AND VERIFIED WITH: ZACHARY ALLEN AT 2332 12/25/14 [facility] GRAM POSITIVE COCCI AEROBIC BOTTLE ONLY    Report Status PENDING  Incomplete  Blood Culture (routine x 2)     Status: None (Preliminary result)   Collection Time: 12/25/14  7:35 AM  Result Value Ref Range Status   Specimen Description BLOOD  Final   Special Requests NONE  Final   Culture NO GROWTH 1 DAY  Final   Report Status PENDING  Incomplete  Influenza A&B Antigens [facility])     Status: None   Collection Time: 12/25/14 12:47 PM  Result Value Ref Range Status   Influenza A ([facility]) NEGATIVE  Final   Influenza B [facility]) NEGATIVE  Final    Medical  History: Past Medical History  Diagnosis Date  . Renal disorder   . Diabetes mellitus without complication     Medications:  See med rec  Assessment: Patient's a 64 y.o M with ESRD on HD MWF who presented to the ED with c/o weakness, cough, SOB, and AMS. CXR with suspicion for PNA.  Patient received 1gm of vancomycin at 0900 and zosyn 3.375 gm IV x1 at 0855 in the ED today.  To continue vancomycin and zosyn for PNA and r/o sepsis.   Goal of Therapy:  Pre-HD vancomycin level: 15-25  Plan:  Patient received vancomycin total of 2 gm loading dose on 5/7.  Ordered Vanc 1gm IV after each HD session - zosyn 3.375 gm IV q12h (over 4 hours) - f/u with cultures, clinical status, and HD schedule  Olivia Canter, RPh Clinical Pharmacist 12/26/2014,12:00 PM

## 2014-12-26 NOTE — Evaluation (Signed)
Physical Therapy Evaluation Patient Details [name] MRN: [id number] DOB: 05/29/1951 Today's Date: 12/26/2014   History of Present Illness  PNA, n/v  Clinical Impression  PT is a 64 y/o male here with weakness, AMS, n/v and pneumonia.  He states he is feeling well, but still remains weak and unsteady (especially with standing/walking activities today).  He did not realize how unsteady he currently is saying he is "at my normal" despite reporting that every day after dialysis he mows the lawn or washes his cars and today he could not take 2 steps w/o losing his balance. PT wants to go home, this is contingent on his progress in the hospital.    Follow Up Recommendations SNF (pt wants to go home, doesn't realize he's not currently safe)    Equipment Recommendations  Rolling walker with 5" wheels (per progress)    Recommendations for Other Services       Precautions / Restrictions Precautions Precautions: Fall Restrictions Weight Bearing Restrictions: No      Mobility  Bed Mobility Overal bed mobility: Needs Assistance Bed Mobility: Supine to Sit;Sit to Supine     Supine to sit: Mod assist Sit to supine: Min assist      Transfers Overall transfer level: Modified independent Equipment used: None (pt was [place] and unsafe will likely need AD next session)                Ambulation/Gait Ambulation/Gait assistance: Mod assist;Max assist Ambulation Distance (Feet): 2 Feet Assistive device:  (hand held assist)       General Gait Details: pt able to take small, unsteady steps but is very unsteady, shows poor awareness and crashes down on bed after completion. He on second attempt he is a little more stable, but still unsafe side stepping at [employer]   Stairs            Wheelchair Mobility    Modified Rankin (Stroke Patients Only)       Balance Overall balance assessment: Needs assistance Sitting-balance support: No upper extremity supported        Standing balance support:  (pt needing min/mod assist with static standing)                                 Pertinent Vitals/Pain Pain Assessment: No/denies pain    Home Living Family/patient expects to be discharged to:: Private residence Living Arrangements: Spouse/significant other Available Help at Discharge: Family Type of Home: House Home Access: Stairs to enter   [employer] of Steps: 8 steps with R rail Home Layout: Two level Home Equipment: Walker - standard      Prior Function Level of Independence: Independent         Comments: Pt reports that he push mows his lawn and washes his cars regularly w/o issue     Hand Dominance        Extremity/Trunk Assessment   Upper Extremity Assessment:  (L UE grossly 3+/5, R 4-/5)           Lower Extremity Assessment: Generalized weakness         Communication      Cognition Arousal/Alertness: Lethargic Behavior During Therapy: WFL for tasks assessed/performed                        General Comments      Exercises        Assessment/Plan  PT Assessment Patient needs continued PT services  PT Diagnosis Difficulty walking;Generalized weakness   PT Problem List Decreased strength;Decreased activity tolerance;Decreased balance;Decreased mobility;Decreased safety awareness  PT Treatment Interventions Gait training;Stair training;Therapeutic activities;Therapeutic exercise;Balance training   PT Goals (Current goals can be found in the Care Plan section) Acute Rehab PT Goals Patient Stated Goal: go home PT Goal Formulation: With patient Time For Goal Achievement: 01/09/15 Potential to Achieve Goals: Good    Frequency Min 2X/week   Barriers to discharge        Co-evaluation               End of Session Equipment Utilized During Treatment: Gait belt Activity Tolerance: Patient tolerated treatment well;Patient limited by fatigue Patient left: in bed            Time: 0958-1020 PT Time Calculation (min) (ACUTE ONLY): 22 min   Charges:   PT Evaluation $Initial PT Evaluation Tier I: 1 Procedure     PT G Codes:       Wayne Both, PT, DPT [phone]  Kreg Shropshire 12/26/2014, 11:49 AM

## 2014-12-27 ENCOUNTER — Inpatient Hospital Stay: Payer: Medicare Other

## 2014-12-27 LAB — CBC
HCT: 24.8 % — ABNORMAL LOW (ref 40.0–52.0)
HCT: 23.3 % — ABNORMAL LOW (ref 40.0–52.0)
HEMOGLOBIN: 8.1 g/dL — AB (ref 13.0–18.0)
HEMOGLOBIN: 7.7 g/dL — AB (ref 13.0–18.0)
MCH: 28.2 pg (ref 26.0–34.0)
MCH: 28.1 pg (ref 26.0–34.0)
MCHC: 32.8 g/dL (ref 32.0–36.0)
MCHC: 33.1 g/dL (ref 32.0–36.0)
MCV: 85.9 fL (ref 80.0–100.0)
MCV: 84.9 fL (ref 80.0–100.0)
Platelets: 117 10*3/uL — ABNORMAL LOW (ref 150–440)
Platelets: 111 10*3/uL — ABNORMAL LOW (ref 150–440)
RBC: 2.89 MIL/uL — AB (ref 4.40–5.90)
RBC: 2.74 MIL/uL — ABNORMAL LOW (ref 4.40–5.90)
RDW: 16.4 % — ABNORMAL HIGH (ref 11.5–14.5)
RDW: 16.6 % — ABNORMAL HIGH (ref 11.5–14.5)
WBC: 9.2 10*3/uL (ref 3.8–10.6)
WBC: 8.9 10*3/uL (ref 3.8–10.6)

## 2014-12-27 LAB — IRON AND TIBC
IRON: 73 ug/dL (ref 45–182)
Saturation Ratios: 60 % — ABNORMAL HIGH (ref 17.9–39.5)
TIBC: 121 ug/dL — ABNORMAL LOW (ref 250–450)
UIBC: 48 ug/dL

## 2014-12-27 LAB — GLUCOSE, CAPILLARY
Glucose-Capillary: 131 mg/dL — ABNORMAL HIGH (ref 70–99)
Glucose-Capillary: 137 mg/dL — ABNORMAL HIGH (ref 70–99)
Glucose-Capillary: 154 mg/dL — ABNORMAL HIGH (ref 70–99)

## 2014-12-27 LAB — AMMONIA: Ammonia: 22 umol/L (ref 9–35)

## 2014-12-27 LAB — RENAL FUNCTION PANEL
ALBUMIN: 2.1 g/dL — AB (ref 3.5–5.0)
ANION GAP: 15 (ref 5–15)
BUN: 81 mg/dL — ABNORMAL HIGH (ref 6–20)
CHLORIDE: 95 mmol/L — AB (ref 101–111)
CO2: 26 mmol/L (ref 22–32)
CREATININE: 13.19 mg/dL — AB (ref 0.61–1.24)
Calcium: 8 mg/dL — ABNORMAL LOW (ref 8.9–10.3)
GFR, EST AFRICAN AMERICAN: 4 mL/min — AB (ref >60)
GFR, EST NON AFRICAN AMERICAN: 4 mL/min — AB (ref >60)
Glucose, Bld: 163 mg/dL — ABNORMAL HIGH (ref 65–99)
POTASSIUM: 4.2 mmol/L (ref 3.5–5.1)
Phosphorus: 4.6 mg/dL (ref 2.5–4.6)
Sodium: 136 mmol/L (ref 135–145)

## 2014-12-27 LAB — COMPREHENSIVE METABOLIC PANEL
ALBUMIN: 2.2 g/dL — AB (ref 3.5–5.0)
ALT: 61 U/L (ref 17–63)
ANION GAP: 14 (ref 5–15)
AST: 50 U/L — ABNORMAL HIGH (ref 15–41)
Alkaline Phosphatase: 64 U/L (ref 38–126)
BILIRUBIN TOTAL: 5.4 mg/dL — AB (ref 0.3–1.2)
BUN: 77 mg/dL — AB (ref 6–20)
CO2: 28 mmol/L (ref 22–32)
CREATININE: 12.65 mg/dL — AB (ref 0.61–1.24)
Calcium: 8.2 mg/dL — ABNORMAL LOW (ref 8.9–10.3)
Chloride: 94 mmol/L — ABNORMAL LOW (ref 101–111)
GFR calc Af Amer: 4 mL/min — ABNORMAL LOW (ref >60)
GFR calc non Af Amer: 4 mL/min — ABNORMAL LOW (ref >60)
Glucose, Bld: 147 mg/dL — ABNORMAL HIGH (ref 65–99)
Potassium: 4.3 mmol/L (ref 3.5–5.1)
Sodium: 136 mmol/L (ref 135–145)
Total Protein: 6.2 g/dL — ABNORMAL LOW (ref 6.5–8.1)

## 2014-12-27 LAB — INFLUENZA PANEL BY PCR (TYPE A & B)

## 2014-12-27 LAB — BILIRUBIN, DIRECT: Bilirubin, Direct: 3.3 mg/dL — ABNORMAL HIGH (ref 0.1–0.5)

## 2014-12-27 MED ORDER — METOPROLOL TARTRATE 25 MG PO TABS
25.0000 mg | ORAL_TABLET | Freq: Two times a day (BID) | ORAL | Status: DC
  Administered 2014-12-27 – 2014-12-28 (×3): 25 mg via ORAL
  Filled 2014-12-27 (×6): qty 1

## 2014-12-27 MED ORDER — MENTHOL 3 MG MT LOZG
1.0000 | LOZENGE | OROMUCOSAL | Status: DC | PRN
  Filled 2014-12-27: qty 9

## 2014-12-27 MED ORDER — TECHNETIUM TC 99M MEBROFENIN IV KIT
7.5000 | PACK | Freq: Once | INTRAVENOUS | Status: AC | PRN
  Administered 2014-12-27: 7.18 via INTRAVENOUS

## 2014-12-27 MED ORDER — IPRATROPIUM-ALBUTEROL 0.5-2.5 (3) MG/3ML IN SOLN
3.0000 mL | Freq: Four times a day (QID) | RESPIRATORY_TRACT | Status: DC
  Administered 2014-12-27 – 2014-12-28 (×3): 3 mL via RESPIRATORY_TRACT
  Filled 2014-12-27 (×3): qty 3

## 2014-12-27 NOTE — Progress Notes (Signed)
PT Cancellation Note  Patient Details Name: TAHMID STONEHOCKER MRN: [id number] DOB: October 20, 1950   Cancelled Treatment:    Reason Eval/Treat Not Completed: Patient at procedure or test/unavailable (treatment session re-attempted.  Patient returned from dialysis, but now off unit for testing.  Will re-attempt at later time/date.)   Tanara Turvey H. Owens Shark, PT, DPT 12/27/2014, 2:36 PM [phone]

## 2014-12-27 NOTE — Progress Notes (Signed)
PT Cancellation Note  Patient Details [name] MRN: [id number] DOB: 13-Sep-1950   Cancelled Treatment:    Reason Eval/Treat Not Completed: Patient at procedure or test/unavailable (Patient currently off unit for dialysis.  Will re-attempt at later time/date as patient available and medically appropriate for treatment.)   Ellison Hughs 12/27/2014, 11:33 AM Reyes Ivan. Owens Shark, PT, DPT 12/27/2014 11:33 AM [phone]

## 2014-12-27 NOTE — Progress Notes (Signed)
HD START 

## 2014-12-27 NOTE — Care Management Note (Signed)
Case Management Note  Patient Details  [name] MRN: [id number] Date of Birth: 1951/06/04  Subjective/Objective:                    Action/Plan:   Expected Discharge Date:                  Expected Discharge Plan:     In-House Referral:     Discharge planning Services     Post Acute Care Choice:    Choice offered to:     DME Arranged:    DME Agency:     HH Arranged:    [name] Agency:     Status of Service:     Medicare Important Message Given:  Yes Date Medicare IM Given:    Medicare IM give by:   Theodoro Kalata nCM) Date Additional Medicare IM Given:  12/27/14 Additional Medicare Important Message give by:     If discussed at [place] of Stay Meetings, dates discussed:    Additional Comments:  Alvie Heidelberg, RN 12/27/2014, 10:39 AM

## 2014-12-27 NOTE — Progress Notes (Signed)
POST HD 

## 2014-12-27 NOTE — Clinical Social Work Note (Signed)
Patient has returned from dialysis and he states to CSW that he would be willing to consider short term rehab if it means that he would regain his strength. Patient states he has never gone to rehab before. CSW explained the placement process and will begin a bedsearch.

## 2014-12-27 NOTE — Progress Notes (Signed)
PRE HD   

## 2014-12-27 NOTE — Progress Notes (Signed)
HD END 

## 2014-12-27 NOTE — Clinical Social Work Placement (Signed)
   CLINICAL SOCIAL WORK PLACEMENT  NOTE  Date:  12/27/2014  Patient Details  [name] MRN: [id number] Date of Birth: 1950-11-13  Clinical Social Work is seeking post-discharge placement for this patient at the [place] level of care (*CSW will initial, date and re-position this form in  chart as items are completed):  Yes   Patient/family provided with [facility] Work Department's list of facilities offering this level of care within the geographic area requested by the patient (or if unable, by the patient's family).  Yes   Patient/family informed of their freedom to choose among providers that offer the needed level of care, that participate in Medicare, Medicaid or managed care program needed by the patient, have an available bed and are willing to accept the patient.  Yes   Patient/family informed of [facility] ownership interest in [facility] and [facility], as well as of the fact that they are under no obligation to receive care at these facilities.  PASRR submitted to EDS on       PASRR number received on       Existing PASRR number confirmed on 12/27/14     FL2 transmitted to all facilities in geographic area requested by pt/family on 12/27/14     FL2 transmitted to all facilities within larger geographic area on       Patient informed that his/her managed care company has contracts with or will negotiate with certain facilities, including the following:            Patient/family informed of bed offers received.  Patient chooses bed at       Physician recommends and patient chooses bed at      Patient to be transferred to   on  .  Patient to be transferred to facility by       Patient family notified on   of transfer.  Name of family member notified:        PHYSICIAN       Additional Comment:    _______________________________________________ Shela Leff, LCSW 12/27/2014, 1:58 PM

## 2014-12-27 NOTE — Clinical Social Work Note (Signed)
Clinical Social Work Assessment  Patient Details  [name] MRN: [id number] Date of Birth: Apr 25, 1951  Date of referral:  12/27/14               Reason for consult:  Facility Placement                Permission sought to share information with:  Family Supports Permission granted to share information::  Yes, Verbal Permission Granted  Name::      (wife)  Agency::     Relationship::     Contact Information:     Housing/Transportation Living arrangements for the past 2 months:   (home) Source of Information:  Spouse Patient Interpreter Needed:  None Criminal Activity/Legal Involvement Pertinent to Current Situation/Hospitalization:  No - Comment as needed Significant Relationships:  Spouse Lives with:  Spouse Do you feel safe going back to the place where you live?  Yes Need for family participation in patient care:  Yes (Comment)  Care giving concerns:  Patient's wife states patient will not want to go to a short term rehab facility.   Social Worker assessment / plan:  CSW spoke with patient's wife while patient was in dialysis. Patient's wife was not aware of the physical therapy recommendations. Patient's wife states that patient is typically independent at home and does not use an assistive device to ambulate. Patient's wife states she lives with her husband and does not work so she is with him 24/7 but she is unsure if she would be able to physically provide the assistance he might need. Patient's wife wishes to discuss the recommendations with patient to see what he would be willing to consider. CSW has completed FL2 and pasrr in the event patient will require short term rehab.   Employment status:  Retired Forensic scientist:  [employer] PT Recommendations:  [place] / Referral to community resources:   (none as of yet)  Patient/Family's Response to care:  Concerned Patient/Family's Understanding of and Emotional Response to Diagnosis,  Current Treatment, and Prognosis: Patient's wife aware that patient will not want to consider rehab.   Emotional Assessment Appearance:  Other (Comment Required (information gathered from wife as patient was off the floor in dialysis) Attitude/Demeanor/Rapport:  Unable to Assess Affect (typically observed):  Unable to Assess Orientation:   (unsure as wife was one interviewed) Alcohol / Substance use:  Not Applicable Psych involvement (Current and /or in the community):  No (Comment)  Discharge Needs  Concerns to be addressed:  Discharge Planning Concerns Readmission within the last 30 days:  No Current discharge risk:  None Barriers to Discharge:  No Barriers Identified   Shela Leff, LCSW 12/27/2014, 1:38 PM

## 2014-12-27 NOTE — Progress Notes (Addendum)
[facility] [place] [name]    DATE OF ADMISSION:  12/25/2014  S:  Abdominal pain improved, billirubin trending down, seen in hd , noted to have afib denies any cp     REVIEW OF SYSTEMS:   CONSTITUTIONAL: Patient afebrile, complains of fatigue and weakness EYES: No blurred or double vision.  EARS, NOSE, AND THROAT: No tinnitus or ear pain.  RESPIRATORY: Reports cough, shortness of breath, productive sputum CARDIOVASCULAR: No chest pain, orthopnea, edema.  GASTROINTESTINAL: nausea and vomiting currently improved, abdominal pain GENITOURINARY: No dysuria, hematuria.  ENDOCRINE: No polyuria, nocturia,  HEMATOLOGY: No anemia, easy bruising or bleeding SKIN: No rash or lesion. MUSCULOSKELETAL: No joint pain or arthritis.   NEUROLOGIC: No tingling, numbness, weakness, currently improved. PSYCHIATRY: No anxiety or depression.   MEDICATIONS AT HOME:  Prior to Admission medications   Medication Sig Start Date End Date Taking? Authorizing Provider  aspirin 325 MG tablet Take 325 mg by mouth daily.   Yes Historical Provider, MD  sevelamer (RENAGEL) 800 MG tablet Take 3,200 mg by mouth 3 (three) times daily with meals.   Yes Historical Provider, MD  docusate sodium (COLACE) 100 MG capsule Take 1 capsule (100 mg total) by mouth 2 (two) times daily. 12/23/14 12/23/15  Lavonia Drafts, MD      PHYSICAL EXAMINATION:   VITAL SIGNS: Blood pressure 107/73, pulse 64, temperature 98.6 F (37 C), temperature source Oral, resp. rate 14, height 5\' 8"  (1.727 m), weight 84.324 kg (185 lb 14.4 oz), SpO2 99 %.  GENERAL:  64 y.o.-year-old patient lying in the bed , frail, ill-appearing EYES: Pupils equal, round, reactive to light and accommodation. No scleral icterus. Extraocular muscles intact.  HEENT: Head atraumatic, normocephalic. Oropharynx and nasopharynx clear.  NECK:  Supple, no jugular venous distention. No thyroid enlargement, no  tenderness.  LUNGS: Normal breath sounds bilaterally, no wheezing, bibasilar Rales. No use of accessory muscles of respiration.  CARDIOVASCULAR: S1, S2 normal. No murmurs, rubs, or gallops.  ABDOMEN: Soft, nontender, nondistended. Bowel sounds present. No organomegaly or mass.  EXTREMITIES: No pedal edema, cyanosis, or clubbing.  NEUROLOGIC: Cranial nerves II through XII are intact. Muscle strength 5/5 in all extremities. Sensation intact. Gait not checked.  PSYCHIATRIC: The patient is alert and oriented  SKIN: No obvious rash, lesion, or ulcer.   LABORATORY PANEL:   CBC  Recent Labs Lab 12/23/14 1055 12/25/14 0735 12/26/14 0706 12/27/14 0729 12/27/14 0900  WBC 6.0 20.4* 11.1* 9.2 8.9  HGB 10.3* 9.2* 7.9* 8.1* 7.7*  HCT 32.2* 29.5* 24.5* 24.8* 23.3*  PLT 252 150 110* 117* 111*  MCV 87.3 87.7 86.0 85.9 84.9  MCH 27.8 27.4 27.8 28.2 28.1  MCHC 31.9* 31.3* 32.3 32.8 33.1  RDW 15.6* 15.8* 16.2* 16.4* 16.6*  LYMPHSABS 0.8* 0.5*  --   --   --   MONOABS 0.3 1.2*  --   --   --   EOSABS 0.0 0.0  --   --   --   BASOSABS 0.1 0.0  --   --   --    ------------------------------------------------------------------------------------------------------------------  Chemistries   Recent Labs Lab 12/23/14 1055 12/25/14 0735 12/26/14 0707 12/27/14 0729 12/27/14 0900  NA 138 139 137 136 136  K 3.3* 3.6 3.9 4.3 4.2  CL 92* 94* 96* 94* 95*  CO2 32 30 30 28 26   GLUCOSE 159* 212* 121* 147* 163*  BUN 27* 38* 52* 77* 81*  CREATININE 8.33* 9.25* 10.71*  12.65* 13.19*  CALCIUM 9.1 8.6* 8.2* 8.2* 8.0*  AST 16 113* 75* 50*  --   ALT 15* 62 62 61  --   ALKPHOS 92 106 75 64  --   BILITOT 1.3* 6.4* 6.6* 5.4*  --    ------------------------------------------------------------------------------------------------------------------ estimated creatinine clearance is 6.2 mL/min (by C-G formula based on Cr of  13.19). ------------------------------------------------------------------------------------------------------------------ No results for input(s): TSH, T4TOTAL, T3FREE, THYROIDAB in the last 72 hours.  Invalid input(s): FREET3   Coagulation profile No results for input(s): INR, PROTIME in the last 168 hours. ------------------------------------------------------------------------------------------------------------------- No results for input(s): DDIMER in the last 72 hours. -------------------------------------------------------------------------------------------------------------------  Cardiac Enzymes No results for input(s): CKMB, TROPONINI, MYOGLOBIN in the last 168 hours.  Invalid input(s): CK ------------------------------------------------------------------------------------------------------------------ Invalid input(s): POCBNP  ---------------------------------------------------------------------------------------------------------------  Urinalysis No results found for: COLORURINE, APPEARANCEUR, LABSPEC, PHURINE, GLUCOSEU, HGBUR, BILIRUBINUR, KETONESUR, PROTEINUR, UROBILINOGEN, NITRITE, LEUKOCYTESUR   RADIOLOGY: US Abdomen Limited Ruq  12/25/2014   CLINICAL DATA:  Elevated bilirubin.  Cholecystitis.  EXAM: US ABDOMEN LIMITED - RIGHT UPPER QUADRANT  COMPARISON:  None.  FINDINGS: Gallbladder:  Cholelithiasis and biliary sludge is present. The largest stone measures 7 mm. Some of the stones appear non mobile despite repositioning. The gallbladder wall is striated and measures 6 mm which is thickened. No sonographic Percell Miller sign is present.  Common bile duct:  Diameter: 6 mm, within normal limits for age.  Liver:  No focal lesion identified. Within normal limits in parenchymal echogenicity.  RIGHT pleural effusion incidentally noted.  IMPRESSION: Biliary sludge in stones with nonspecific gallbladder wall thickening. In the absence of a sonographic Murphy sign, the significance is  unclear. The sonographic findings are compatible with cholecystitis which may be chronic. In a patient with elevated bilirubin, consider HIDA scan for further assessment.   Electronically Signed   By: Dereck Ligas M.D.   On: 12/25/2014 15:37    EKG: No orders found for this or any previous visit.  IMPRESSION AND PLAN:  Sepsis - Secondary to aspiration pneumonia,   Continue IV vancomycin and Zosyn.  Sx improved  - Aspiration pneumonia/HCAP - Pulmonary toilet, flutter valve, nebs, Mucinex -  End-stage renal disease Nephrology consult to resume his hemodialysis- currently receiving - Continue with Renagel and renal diet.  Atrial fibrillation New onset, chehck tsh, echo cardiology input hold blood thiners inlight of lower hgb  Elevated Bilirubin - Ultrasound RUQ which shows some gallbladder sludge but the common bile duct size is normal.  Appreciate gi eval,  hida pending, billirubin trending down, follow lf'ts  History of CVA - Denies any new focal deficits, will continue on aspirin.  Diabetes mellitus - Appears to be controlled with diet, not taking any home medication, will monitor CBGs, if needed will start on insulin sliding scale  Anemia- no evidence of hemolysis  TOTAL TIME TAKING CARE OF THIS PATIENT: 21min  Brendin Situ M.D on 12/27/2014 at 11:54 AM  Between 7am to 6pm - Pager - [phone]  After 6pm go to [email or web address] - password [email or web address] [facility]  Office  [phone]  CC: Primary care physician; Miguel Aschoff, MD

## 2014-12-27 NOTE — Consult Note (Signed)
Subjective: Patient seen for abnormal liver enzymes and anemia. She has been hemodynamically stable. There is no evidence of bleeding. He denies nausea vomiting or abdominal pain. Patient is due for dialysis again tomorrow.  Objective: Vital signs in last 24 hours: Temp:  [98 F (36.7 C)-99.5 F (37.5 C)] 99.5 F (37.5 C) (05/09 1346) Pulse Rate:  [52-125] 86 (05/09 1346) Resp:  [14-24] 17 (05/09 1346) BP: (98-138)/(60-82) 138/72 mmHg (05/09 1346) SpO2:  [96 %-100 %] 99 % (05/09 1346) Weight:  [84.2 kg (185 lb 10 oz)-84.324 kg (185 lb 14.4 oz)] 84.2 kg (185 lb 10 oz) (05/09 1315) Blood pressure 138/72, pulse 86, temperature 99.5 F (37.5 C), temperature source Axillary, resp. rate 17, height 5\' 8"  (1.727 m), weight 84.2 kg (185 lb 10 oz), SpO2 99 %.   Intake/Output from previous day:    Intake/Output this shift: Total I/O In: 0  Out: -230    General appearance:  No acute distress Resp:  Coarse mild rhonchi lateral lower lung fields more so right than left Cardio:  Regular rate and rhythm without rub or gallop GI:  Soft nontender nondistended bowel sounds positive normoactive Extremities:     Lab Results: Results for orders placed or performed during the hospital encounter of 12/25/14 (from the past 24 hour(s))  Glucose, capillary     Status: Abnormal   Collection Time: 12/26/14 10:42 PM  Result Value Ref Range   Glucose-Capillary 165 (H) 70 - 99 mg/dL  CBC     Status: Abnormal   Collection Time: 12/27/14  7:29 AM  Result Value Ref Range   WBC 9.2 3.8 - 10.6 K/uL   RBC 2.89 (L) 4.40 - 5.90 MIL/uL   Hemoglobin 8.1 (L) 13.0 - 18.0 g/dL   HCT 24.8 (L) 40.0 - 52.0 %   MCV 85.9 80.0 - 100.0 fL   MCH 28.2 26.0 - 34.0 pg   MCHC 32.8 32.0 - 36.0 g/dL   RDW 16.4 (H) 11.5 - 14.5 %   Platelets 117 (L) 150 - 440 K/uL  Comprehensive metabolic panel     Status: Abnormal   Collection Time: 12/27/14  7:29 AM  Result Value Ref Range   Sodium 136 135 - 145 mmol/L   Potassium 4.3  3.5 - 5.1 mmol/L   Chloride 94 (L) 101 - 111 mmol/L   CO2 28 22 - 32 mmol/L   Glucose, Bld 147 (H) 65 - 99 mg/dL   BUN 77 (H) 6 - 20 mg/dL   Creatinine, Ser 12.65 (H) 0.61 - 1.24 mg/dL   Calcium 8.2 (L) 8.9 - 10.3 mg/dL   Total Protein 6.2 (L) 6.5 - 8.1 g/dL   Albumin 2.2 (L) 3.5 - 5.0 g/dL   AST 50 (H) 15 - 41 U/L   ALT 61 17 - 63 U/L   Alkaline Phosphatase 64 38 - 126 U/L   Total Bilirubin 5.4 (H) 0.3 - 1.2 mg/dL   GFR calc non Af Amer 4 (L) >60 mL/min   GFR calc Af Amer 4 (L) >60 mL/min   Anion gap 14 5 - 15  Bilirubin, direct     Status: Abnormal   Collection Time: 12/27/14  7:29 AM  Result Value Ref Range   Bilirubin, Direct 3.3 (H) 0.1 - 0.5 mg/dL  Iron and TIBC     Status: Abnormal   Collection Time: 12/27/14  7:29 AM  Result Value Ref Range   Iron 73 45 - 182 ug/dL   TIBC 121 (L) 250 - 450  ug/dL   Saturation Ratios 60 (H) 17.9 - 39.5 %   UIBC 48 ug/dL  Ammonia     Status: None   Collection Time: 12/27/14  7:30 AM  Result Value Ref Range   Ammonia 22 9 - 35 umol/L  Glucose, capillary     Status: Abnormal   Collection Time: 12/27/14  7:42 AM  Result Value Ref Range   Glucose-Capillary 131 (H) 70 - 99 mg/dL   Comment 1 Notify RN   Renal function panel     Status: Abnormal   Collection Time: 12/27/14  9:00 AM  Result Value Ref Range   Sodium 136 135 - 145 mmol/L   Potassium 4.2 3.5 - 5.1 mmol/L   Chloride 95 (L) 101 - 111 mmol/L   CO2 26 22 - 32 mmol/L   Glucose, Bld 163 (H) 65 - 99 mg/dL   BUN 81 (H) 6 - 20 mg/dL   Creatinine, Ser 13.19 (H) 0.61 - 1.24 mg/dL   Calcium 8.0 (L) 8.9 - 10.3 mg/dL   Phosphorus 4.6 2.5 - 4.6 mg/dL   Albumin 2.1 (L) 3.5 - 5.0 g/dL   GFR calc non Af Amer 4 (L) >60 mL/min   GFR calc Af Amer 4 (L) >60 mL/min   Anion gap 15 5 - 15  CBC     Status: Abnormal   Collection Time: 12/27/14  9:00 AM  Result Value Ref Range   WBC 8.9 3.8 - 10.6 K/uL   RBC 2.74 (L) 4.40 - 5.90 MIL/uL   Hemoglobin 7.7 (L) 13.0 - 18.0 g/dL   HCT 23.3 (L) 40.0  - 52.0 %   MCV 84.9 80.0 - 100.0 fL   MCH 28.1 26.0 - 34.0 pg   MCHC 33.1 32.0 - 36.0 g/dL   RDW 16.6 (H) 11.5 - 14.5 %   Platelets 111 (L) 150 - 440 K/uL  Glucose, capillary     Status: Abnormal   Collection Time: 12/27/14  4:40 PM  Result Value Ref Range   Glucose-Capillary 137 (H) 70 - 99 mg/dL   Comment 1 Notify RN       Recent Labs  12/26/14 0706 12/27/14 0729 12/27/14 0900  WBC 11.1* 9.2 8.9  HGB 7.9* 8.1* 7.7*  HCT 24.5* 24.8* 23.3*  PLT 110* 117* 111*   BMET  Recent Labs  12/26/14 0707 12/27/14 0729 12/27/14 0900  NA 137 136 136  K 3.9 4.3 4.2  CL 96* 94* 95*  CO2 30 28 26   GLUCOSE 121* 147* 163*  BUN 52* 77* 81*  CREATININE 10.71* 12.65* 13.19*  CALCIUM 8.2* 8.2* 8.0*   LFT  Recent Labs  12/27/14 0729 12/27/14 0900  PROT 6.2*  --   ALBUMIN 2.2* 2.1*  AST 50*  --   ALT 61  --   ALKPHOS 64  --   BILITOT 5.4*  --   BILIDIR 3.3*  --    PT/INR No results for input(s): LABPROT, INR in the last 72 hours. Hepatitis Panel No results for input(s): HEPBSAG, HCVAB, HEPAIGM, HEPBIGM in the last 72 hours. C-Diff No results for input(s): CDIFFTOX in the last 72 hours. No results for input(s): CDIFFPCR in the last 72 hours.   Studies/Results: Nm Hepatobiliary Liver Func  12/27/2014   CLINICAL DATA:  Elevated bilirubin.  EXAM: NUCLEAR MEDICINE HEPATOBILIARY IMAGING  TECHNIQUE: Sequential images of the abdomen were obtained out to 60 minutes following intravenous administration of radiopharmaceutical.  RADIOPHARMACEUTICALS:  7.18 mCi Tc-26m Choletec IV  COMPARISON:  Ultrasound  of Dec 25, 2014.  FINDINGS: Uptake within hepatic parenchyma is noted. Filling of small bowel is noted. However, no uptake of filling of gallbladder is seen 90 minutes after radiotracer administration. This is concerning for cystic duct obstruction and possible cholecystitis.  IMPRESSION: No uptake within gallbladder is seen up to 90 minutes after radiotracer administration, concerning for  cystic duct obstruction and possible cholecystitis.   Electronically Signed   By: Marijo Conception, M.D.   On: 12/27/2014 16:24    Scheduled Inpatient Medications:   . heparin  5,000 Units Subcutaneous 3 times per day  . ipratropium-albuterol  3 mL Nebulization Q6H  . metoprolol tartrate  25 mg Oral BID  . piperacillin-tazobactam (ZOSYN)  IV  3.375 g Intravenous Q12H  . pneumococcal 23 valent vaccine  0.5 mL Intramuscular Tomorrow-1000  . sevelamer carbonate  3,200 mg Oral TID WC  . vancomycin  1,000 mg Intravenous Q M,W,F-HD      PRN Inpatient Medications:  acetaminophen **OR** acetaminophen, albuterol, menthol-cetylpyridinium, ondansetron **OR** ondansetron (ZOFRAN) IV, polyethylene glycol  Miscellaneous:    Assessment:  1. Abnormal lfts-HIDA showing probable cystic duct obstruction.  Possible old chronic cholecystitis.  Patient has no abd pain.  2. Anemia, likely multifactorial with contribution from chronic kidney disease. Patient has a personal history of adenomatous colon polyps and is overdue for reevaluation. In the setting of his recurrent anemia recommend EGD and colonoscopy however would want to wait resolution of current clinical situation in regards to his pneumonia.   Plan:  1. Case discussed with Dr. Posey Pronto. Will await surgical input in regards to the abnormal biliary scan. At times abnormalities of the biliary scan can be attributable to cholestasis in the liver/elevated bilirubin. However this is usually at level is higher than currently seen. 2. EGD and colonoscopy when clinically feasible. This could be arranged as an outpatient in a couple of weeks once current clinical situation is resolved. Recommend transfusion if needed. 3. Following with you, will recheck LFTs in a.m.  Lollie Sails MD 12/27/2014, 6:21 PM

## 2014-12-27 NOTE — Progress Notes (Signed)
Subjective:   Patient seen during dialysis HR in 110-160's BP stable Patient asymptomatic Denies chest pain or SOB. Reports sore throat Blood pump speed lowered  Objective:  Vital signs in last 24 hours:  Temp:  [98 F (36.7 C)-99.1 F (37.3 C)] 98.6 F (37 C) (05/09 1000) Pulse Rate:  [64-125] 64 (05/09 1130) Resp:  [14-26] 14 (05/09 1130) BP: (98-114)/(60-79) 107/73 mmHg (05/09 1130) SpO2:  [96 %-100 %] 99 % (05/09 1000) Weight:  [84.324 kg (185 lb 14.4 oz)] 84.324 kg (185 lb 14.4 oz) (05/09 0500)  Weight change: 2.676 kg (5 lb 14.4 oz) Filed Weights   12/25/14 1221 12/26/14 0531 12/27/14 0500  Weight: 81.647 kg (180 lb) 85.2 kg (187 lb 13.3 oz) 84.324 kg (185 lb 14.4 oz)    Intake/Output: I/O last 3 completed shifts: In: 237.5 [IV Piggyback:237.5] Out: -    Intake/Output this shift:     Physical Exam: General: NAD,   Head: Normocephalic, atraumatic. Moist oral mucosal membranes  Eyes: Anicteric,  Neck: Supple, trachea midline  Lungs:  Clear to auscultation  Heart: Regular rate and rhythm  Abdomen:  Soft, nontender,   Extremities:  + peripheral edema.  Neurologic: Nonfocal, moving all four extremities  Skin: No lesions  Access: AVF     Basic Metabolic Panel:  Recent Labs Lab 12/23/14 1055 12/25/14 0735 12/26/14 0707 12/27/14 0729 12/27/14 0900  NA 138 139 137 136 136  K 3.3* 3.6 3.9 4.3 4.2  CL 92* 94* 96* 94* 95*  CO2 32 30 30 28 26   GLUCOSE 159* 212* 121* 147* 163*  BUN 27* 38* 52* 77* 81*  CREATININE 8.33* 9.25* 10.71* 12.65* 13.19*  CALCIUM 9.1 8.6* 8.2* 8.2* 8.0*  PHOS  --   --   --   --  4.6    Liver Function Tests:  Recent Labs Lab 12/23/14 1055 12/25/14 0735 12/26/14 0707 12/27/14 0729 12/27/14 0900  AST 16 113* 75* 50*  --   ALT 15* 62 62 61  --   ALKPHOS 92 106 75 64  --   BILITOT 1.3* 6.4* 6.6* 5.4*  --   PROT 8.0 6.7 5.3* 6.2*  --   ALBUMIN 3.6 2.9* 2.2* 2.2* 2.1*    Recent Labs Lab 12/23/14 1055  LIPASE 31     Recent Labs Lab 12/27/14 0730  AMMONIA 22    CBC:  Recent Labs Lab 12/23/14 1055 12/25/14 0735 12/26/14 0706 12/27/14 0729 12/27/14 0900  WBC 6.0 20.4* 11.1* 9.2 8.9  NEUTROABS 4.8 18.6*  --   --   --   HGB 10.3* 9.2* 7.9* 8.1* 7.7*  HCT 32.2* 29.5* 24.5* 24.8* 23.3*  MCV 87.3 87.7 86.0 85.9 84.9  PLT 252 150 110* 117* 111*    Cardiac Enzymes: No results for input(s): CKTOTAL, CKMB, CKMBINDEX, TROPONINI in the last 168 hours.  BNP: Invalid input(s): POCBNP  CBG:  Recent Labs Lab 12/26/14 0709 12/26/14 1109 12/26/14 1612 12/26/14 2242 12/27/14 0742  GLUCAP 121* 154* 170* 165* 131*    Microbiology: Results for orders placed or performed during the hospital encounter of 12/25/14  Blood Culture (routine x 2)     Status: None (Preliminary result)   Collection Time: 12/25/14  7:35 AM  Result Value Ref Range Status   Specimen Description BLOOD  Final   Special Requests NONE  Final   Culture   Final    GRAM NEGATIVE RODS IN BOTH AEROBIC AND ANAEROBIC BOTTLES CRITICAL RESULT CALLED TO, READ BACK BY AND VERIFIED  WITH: Herma Carson AT 2332 12/25/14 [facility] GRAM POSITIVE COCCI AEROBIC BOTTLE ONLY    Report Status PENDING  Incomplete  Blood Culture (routine x 2)     Status: None (Preliminary result)   Collection Time: 12/25/14  7:35 AM  Result Value Ref Range Status   Specimen Description BLOOD  Final   Special Requests NONE  Final   Culture   Final    GRAM NEGATIVE RODS IN BOTH AEROBIC AND ANAEROBIC BOTTLES CRITICAL VALUE NOTED.  VALUE IS CONSISTENT WITH PREVIOUSLY REPORTED AND CALLED VALUE. GRAM POSITIVE COCCI AEROBIC BOTTLE ONLY CRITICAL VALUE NOTED.  VALUE IS CONSISTENT WITH PREVIOUSLY REPORTED AND CALLED VALUE. IDENTIFICATION TO FOLLOW SUSCEPTIBILITIES TO FOLLOW    Report Status PENDING  Incomplete  Influenza A&B Antigens [facility])     Status: None   Collection Time: 12/25/14 12:47 PM  Result Value Ref Range Status   Influenza A ([facility]) NEGATIVE  Final    Influenza B ([facility]) NEGATIVE  Final    Coagulation Studies: No results for input(s): LABPROT, INR in the last 72 hours.  Urinalysis: No results for input(s): COLORURINE, LABSPEC, PHURINE, GLUCOSEU, HGBUR, BILIRUBINUR, KETONESUR, PROTEINUR, UROBILINOGEN, NITRITE, LEUKOCYTESUR in the last 72 hours.  Invalid input(s): APPERANCEUR    Imaging: US Abdomen Limited Ruq  12/25/2014   CLINICAL DATA:  Elevated bilirubin.  Cholecystitis.  EXAM: US ABDOMEN LIMITED - RIGHT UPPER QUADRANT  COMPARISON:  None.  FINDINGS: Gallbladder:  Cholelithiasis and biliary sludge is present. The largest stone measures 7 mm. Some of the stones appear non mobile despite repositioning. The gallbladder wall is striated and measures 6 mm which is thickened. No sonographic Percell Miller sign is present.  Common bile duct:  Diameter: 6 mm, within normal limits for age.  Liver:  No focal lesion identified. Within normal limits in parenchymal echogenicity.  RIGHT pleural effusion incidentally noted.  IMPRESSION: Biliary sludge in stones with nonspecific gallbladder wall thickening. In the absence of a sonographic Murphy sign, the significance is unclear. The sonographic findings are compatible with cholecystitis which may be chronic. In a patient with elevated bilirubin, consider HIDA scan for further assessment.   Electronically Signed   By: Dereck Ligas M.D.   On: 12/25/2014 15:37     Medications:     . sodium chloride  100 mL Intravenous Once  . epoetin (EPOGEN/PROCRIT) injection  10,000 Units Intravenous Once  . epoetin (EPOGEN/PROCRIT) injection  10,000 Units Intravenous Once  . heparin  5,000 Units Subcutaneous 3 times per day  . ipratropium-albuterol  3 mL Nebulization Q6H  . piperacillin-tazobactam (ZOSYN)  IV  3.375 g Intravenous Q12H  . pneumococcal 23 valent vaccine  0.5 mL Intramuscular Tomorrow-1000  . sevelamer carbonate  3,200 mg Oral TID WC  . vancomycin  1,000 mg Intravenous Q M,W,F-HD   sodium chloride, sodium  chloride, sodium chloride, acetaminophen **OR** acetaminophen, albuterol, menthol-cetylpyridinium, ondansetron **OR** ondansetron (ZOFRAN) IV, polyethylene glycol  Assessment/ Plan:  64 y.o. male End stage renal disease, hypertension, diabetes mellitus type II, CVA, coronary artery disease who presents to [facility] on 12/25/2014 for right lower lobe pneumonia  MWF [facility] [place]   1. End Stage Renal Disease: MWF. Patient seen during treatment  See above   2. Pneumonia with sepsis: Abx as per IM physician  3. Anemia of chronic kidney disease: hemoglobin low, decreasing - epo with HD treatment   4. Secondary Hyperparathyroidism:  - sevelamer with meals.     LOS: 2 Alissandra Geoffroy 5/9/201611:46 AM

## 2014-12-28 ENCOUNTER — Encounter: Payer: Self-pay | Admitting: Physician Assistant

## 2014-12-28 ENCOUNTER — Inpatient Hospital Stay (HOSPITAL_COMMUNITY): Payer: Medicare Other

## 2014-12-28 DIAGNOSIS — I2510 Atherosclerotic heart disease of native coronary artery without angina pectoris: Secondary | ICD-10-CM

## 2014-12-28 DIAGNOSIS — A419 Sepsis, unspecified organism: Secondary | ICD-10-CM

## 2014-12-28 DIAGNOSIS — R000 Tachycardia, unspecified: Secondary | ICD-10-CM

## 2014-12-28 DIAGNOSIS — D649 Anemia, unspecified: Secondary | ICD-10-CM

## 2014-12-28 DIAGNOSIS — I340 Nonrheumatic mitral (valve) insufficiency: Secondary | ICD-10-CM

## 2014-12-28 LAB — GLUCOSE, CAPILLARY
GLUCOSE-CAPILLARY: 176 mg/dL — AB (ref 70–99)
Glucose-Capillary: 138 mg/dL — ABNORMAL HIGH (ref 70–99)
Glucose-Capillary: 160 mg/dL — ABNORMAL HIGH (ref 70–99)
Glucose-Capillary: 146 mg/dL — ABNORMAL HIGH (ref 70–99)

## 2014-12-28 LAB — COMPREHENSIVE METABOLIC PANEL
ALBUMIN: 2.2 g/dL — AB (ref 3.5–5.0)
ALT: 49 U/L (ref 17–63)
AST: 34 U/L (ref 15–41)
Alkaline Phosphatase: 61 U/L (ref 38–126)
Anion gap: 13 (ref 5–15)
BUN: 66 mg/dL — ABNORMAL HIGH (ref 6–20)
CALCIUM: 8.2 mg/dL — AB (ref 8.9–10.3)
CO2: 30 mmol/L (ref 22–32)
CREATININE: 9.61 mg/dL — AB (ref 0.61–1.24)
Chloride: 94 mmol/L — ABNORMAL LOW (ref 101–111)
GFR calc Af Amer: 6 mL/min — ABNORMAL LOW (ref >60)
GFR calc non Af Amer: 5 mL/min — ABNORMAL LOW (ref >60)
Glucose, Bld: 195 mg/dL — ABNORMAL HIGH (ref 65–99)
Potassium: 4.3 mmol/L (ref 3.5–5.1)
Sodium: 137 mmol/L (ref 135–145)
Total Bilirubin: 3.8 mg/dL — ABNORMAL HIGH (ref 0.3–1.2)
Total Protein: 6.1 g/dL — ABNORMAL LOW (ref 6.5–8.1)

## 2014-12-28 LAB — BASIC METABOLIC PANEL
Anion gap: 12 (ref 5–15)
BUN: 54 mg/dL — ABNORMAL HIGH (ref 6–20)
CALCIUM: 8.2 mg/dL — AB (ref 8.9–10.3)
CO2: 30 mmol/L (ref 22–32)
Chloride: 95 mmol/L — ABNORMAL LOW (ref 101–111)
Creatinine, Ser: 8.86 mg/dL — ABNORMAL HIGH (ref 0.61–1.24)
GFR calc Af Amer: 7 mL/min — ABNORMAL LOW (ref >60)
GFR, EST NON AFRICAN AMERICAN: 6 mL/min — AB (ref >60)
Glucose, Bld: 160 mg/dL — ABNORMAL HIGH (ref 65–99)
Potassium: 4.3 mmol/L (ref 3.5–5.1)
Sodium: 137 mmol/L (ref 135–145)

## 2014-12-28 LAB — HAPTOGLOBIN: HAPTOGLOBIN: 198 mg/dL (ref 34–200)

## 2014-12-28 LAB — CBC
HCT: 24.9 % — ABNORMAL LOW (ref 40.0–52.0)
Hemoglobin: 8.1 g/dL — ABNORMAL LOW (ref 13.0–18.0)
MCH: 27.6 pg (ref 26.0–34.0)
MCHC: 32.4 g/dL (ref 32.0–36.0)
MCV: 85.1 fL (ref 80.0–100.0)
PLATELETS: 127 10*3/uL — AB (ref 150–440)
RBC: 2.93 MIL/uL — ABNORMAL LOW (ref 4.40–5.90)
RDW: 16.4 % — AB (ref 11.5–14.5)
WBC: 8.1 10*3/uL (ref 3.8–10.6)

## 2014-12-28 LAB — HEPATITIS B SURFACE ANTIGEN: Hepatitis B Surface Ag: NEGATIVE — AB

## 2014-12-28 LAB — MAGNESIUM: MAGNESIUM: 2.5 mg/dL — AB (ref 1.7–2.4)

## 2014-12-28 LAB — PARATHYROID HORMONE, INTACT (NO CA): PTH: 268 pg/mL — ABNORMAL HIGH (ref 15–65)

## 2014-12-28 LAB — HEPATIC FUNCTION PANEL
ALBUMIN: 2.3 g/dL — AB (ref 3.5–5.0)
ALK PHOS: 62 U/L (ref 38–126)
ALT: 56 U/L (ref 17–63)
AST: 41 U/L (ref 15–41)
BILIRUBIN INDIRECT: 1.8 mg/dL — AB (ref 0.3–0.9)
Bilirubin, Direct: 2.5 mg/dL — ABNORMAL HIGH (ref 0.1–0.5)
TOTAL PROTEIN: 6.2 g/dL — AB (ref 6.5–8.1)
Total Bilirubin: 4.3 mg/dL — ABNORMAL HIGH (ref 0.3–1.2)

## 2014-12-28 LAB — HEPATITIS B SURFACE ANTIBODY, QUANTITATIVE: Hep B S AB Quant (Post): 317.8 m[IU]/mL (ref >9.9)

## 2014-12-28 LAB — TSH: TSH: 0.162 u[IU]/mL — ABNORMAL LOW (ref 0.350–4.500)

## 2014-12-28 LAB — HEPATITIS B SURFACE ANTIBODY,QUALITATIVE: Hep B S Ab: REACTIVE

## 2014-12-28 LAB — HEPATITIS B CORE ANTIBODY, TOTAL: Hep B Core Total Ab: NEGATIVE

## 2014-12-28 LAB — T4, FREE: FREE T4: 1.36 ng/dL — AB (ref 0.61–1.12)

## 2014-12-28 NOTE — Clinical Social Work Note (Signed)
Patient has had bed offer from [facility] and they have accepted this offer.  Shela Leff MSW,LCSWA [phone]

## 2014-12-28 NOTE — Progress Notes (Signed)
[place] [facility] [place] [name]    DATE OF ADMISSION:  12/25/2014  S:  Abdominal pain improved, billirubin little better, pt tolerating diet     REVIEW OF SYSTEMS:   CONSTITUTIONAL: Patient afebrile, complains of fatigue and weakness EYES: No blurred or double vision.  EARS, NOSE, AND THROAT: No tinnitus or ear pain.  RESPIRATORY: Reports cough, shortness of breath, productive sputum CARDIOVASCULAR: No chest pain, orthopnea, edema.  GASTROINTESTINAL: nausea and vomiting   improved, abdominal pain resolved GENITOURINARY: No dysuria, hematuria.  ENDOCRINE: No polyuria, nocturia,  HEMATOLOGY: No anemia, easy bruising or bleeding SKIN: No rash or lesion. MUSCULOSKELETAL: No joint pain or arthritis.   NEUROLOGIC: No tingling, numbness, weakness, currently improved. PSYCHIATRY: No anxiety or depression.   MEDICATIONS AT HOME:  Prior to Admission medications   Medication Sig Start Date End Date Taking? Authorizing Provider  aspirin 325 MG tablet Take 325 mg by mouth daily.   Yes Historical Provider, MD  sevelamer (RENAGEL) 800 MG tablet Take 3,200 mg by mouth 3 (three) times daily with meals.   Yes Historical Provider, MD  docusate sodium (COLACE) 100 MG capsule Take 1 capsule (100 mg total) by mouth 2 (two) times daily. 12/23/14 12/23/15  Lavonia Drafts, MD      PHYSICAL EXAMINATION:   VITAL SIGNS: Blood pressure 117/61, pulse 79, temperature 98.7 F (37.1 C), temperature source Oral, resp. rate 18, height 5\' 8"  (1.727 m), weight 84.052 kg (185 lb 4.8 oz), SpO2 91 %.  GENERAL:  64 y.o.-year-old patient lying in the bed , frail, ill-appearing EYES: Pupils equal, round, reactive to light and accommodation. No scleral icterus. Extraocular muscles intact.  HEENT: Head atraumatic, normocephalic. Oropharynx and nasopharynx clear.  NECK:  Supple, no jugular venous distention. No thyroid enlargement, no tenderness.  LUNGS: Normal  breath sounds bilaterally, no wheezing, bibasilar Rales. No use of accessory muscles of respiration.  CARDIOVASCULAR: S1, S2 normal. No murmurs, rubs, or gallops.  ABDOMEN: Soft, nontender, nondistended. Bowel sounds present. No organomegaly or mass.  EXTREMITIES: No pedal edema, cyanosis, or clubbing.  NEUROLOGIC: Cranial nerves II through XII are intact. Muscle strength 5/5 in all extremities. Sensation intact. Gait not checked.  PSYCHIATRIC: The patient is alert and oriented  SKIN: No obvious rash, lesion, or ulcer.   LABORATORY PANEL:   CBC  Recent Labs Lab 12/23/14 1055 12/25/14 0735 12/26/14 0706 12/27/14 0729 12/27/14 0900 12/28/14 1305  WBC 6.0 20.4* 11.1* 9.2 8.9 8.1  HGB 10.3* 9.2* 7.9* 8.1* 7.7* 8.1*  HCT 32.2* 29.5* 24.5* 24.8* 23.3* 24.9*  PLT 252 150 110* 117* 111* 127*  MCV 87.3 87.7 86.0 85.9 84.9 85.1  MCH 27.8 27.4 27.8 28.2 28.1 27.6  MCHC 31.9* 31.3* 32.3 32.8 33.1 32.4  RDW 15.6* 15.8* 16.2* 16.4* 16.6* 16.4*  LYMPHSABS 0.8* 0.5*  --   --   --   --   MONOABS 0.3 1.2*  --   --   --   --   EOSABS 0.0 0.0  --   --   --   --   BASOSABS 0.1 0.0  --   --   --   --    ------------------------------------------------------------------------------------------------------------------  Chemistries   Recent Labs Lab 12/25/14 0735 12/26/14 0707 12/27/14 0729 12/27/14 0900 12/28/14 0437 12/28/14 1305  NA 139 137 136 136 137 137  K 3.6 3.9 4.3 4.2 4.3 4.3  CL 94* 96* 94* 95* 95* 94*  CO2 30 30 28  26 30 30   GLUCOSE 212* 121* 147* 163* 160* 195*  BUN 38* 52* 77* 81* 54* 66*  CREATININE 9.25* 10.71* 12.65* 13.19* 8.86* 9.61*  CALCIUM 8.6* 8.2* 8.2* 8.0* 8.2* 8.2*  MG  --   --  2.5*  --   --   --   AST 113* 75* 50*  --  41 34  ALT 62 62 61  --  56 49  ALKPHOS 106 75 64  --  62 61  BILITOT 6.4* 6.6* 5.4*  --  4.3* 3.8*   ------------------------------------------------------------------------------------------------------------------ estimated creatinine  clearance is 8.5 mL/min (by C-G formula based on Cr of 9.61). ------------------------------------------------------------------------------------------------------------------  Recent Labs  12/28/14 0437  TSH 0.162*     Coagulation profile No results for input(s): INR, PROTIME in the last 168 hours. ------------------------------------------------------------------------------------------------------------------- No results for input(s): DDIMER in the last 72 hours. -------------------------------------------------------------------------------------------------------------------  Cardiac Enzymes No results for input(s): CKMB, TROPONINI, MYOGLOBIN in the last 168 hours.  Invalid input(s): CK ------------------------------------------------------------------------------------------------------------------ Invalid input(s): POCBNP  ---------------------------------------------------------------------------------------------------------------  Urinalysis No results found for: COLORURINE, APPEARANCEUR, LABSPEC, PHURINE, GLUCOSEU, HGBUR, BILIRUBINUR, KETONESUR, PROTEINUR, UROBILINOGEN, NITRITE, LEUKOCYTESUR   RADIOLOGY: Nm Hepatobiliary Liver Func  12/27/2014   CLINICAL DATA:  Elevated bilirubin.  EXAM: NUCLEAR MEDICINE HEPATOBILIARY IMAGING  TECHNIQUE: Sequential images of the abdomen were obtained out to 60 minutes following intravenous administration of radiopharmaceutical.  RADIOPHARMACEUTICALS:  7.18 mCi Tc-67m Choletec IV  COMPARISON:  Ultrasound of Dec 25, 2014.  FINDINGS: Uptake within hepatic parenchyma is noted. Filling of small bowel is noted. However, no uptake of filling of gallbladder is seen 90 minutes after radiotracer administration. This is concerning for cystic duct obstruction and possible cholecystitis.  IMPRESSION: No uptake within gallbladder is seen up to 90 minutes after radiotracer administration, concerning for cystic duct obstruction and possible cholecystitis.    Electronically Signed   By: Marijo Conception, M.D.   On: 12/27/2014 16:24    EKG: Orders placed or performed during the hospital encounter of 12/25/14  . EKG  . EKG 12-Lead  . EKG 12-Lead  . EKG 12-Lead  . EKG 12-Lead  . EKG 12-Lead  . EKG 12-Lead    IMPRESSION AND PLAN:  Sepsis - Secondary to aspiration pneumonia,   change to po abx tomm - Aspiration pneumonia/HCAP - Pulmonary toilet, flutter valve, nebs, Mucinex Now improved off oxygen    End-stage renal disease Nephrology consult to resume his hemodialysis- currently receiving - Continue with Renagel and renal diet.  Atrial fibrillation New onset,  Echo pending, tsh low ft4 elevated possible hyperthyrodism, ask endo to see  Elevated Bilirubin - Ultrasound RUQ which shows some gallbladder sludge but the common bile duct size is normal.  hida abnormal I have d/w dr. Copper who will reacess  History of CVA - Denies any new focal deficits, will continue on aspirin.  Diabetes mellitus - Appears to be controlled with diet, not taking any home medication, will monitor CBGs, if needed will start on insulin sliding scale  Anemia- no evidence of hemolysis  TOTAL TIME TAKING CARE OF THIS PATIENT: 17min  Marcy Sookdeo M.D on 12/28/2014 at 2:14 PM  Between 7am to 6pm - Pager - [phone]  After 6pm go to [email or web address] - password [email or web address] [place]  Office  [phone]  CC: Primary care physician; Miguel Aschoff, MD

## 2014-12-28 NOTE — Consult Note (Addendum)
Cardiology Consultation Note  Patient ID: Luis Walter, MRN: [id number], DOB/AGE: 10-25-1950 64 y.o. Admit date: 12/25/2014   Date of Consult: 12/28/2014 Primary Physician: Miguel Aschoff, MD Primary Cardiologist: New to [facility]  Chief Complaint: Nausea and vomiting x 1 week Reason for Consult: Question a-fib on 5/9  HPI: 64 y.o. male with h/o CAD medically managed, prior stroke, ESRD on HD Monday, Wednesday, and Friday previously on the transplant list since 2010 but has since been removed as of 2015 secondary to noncompliance and risk vs benefit, and DM2 who presented to [facility] ED on 5/5 and 5/7 with complaints of generalized weakness, cough, SOB, confusion, nausea, and vomiting and was found to have PNA on 5/7 and admitted to the hospital.   He has known CAD that has been medically managed by [facility] as part of his transplant evaluation. He underwent a stress echo in 10/2008 and and 08/2010 with wall motion normal at rest and hyperkinetic at stress though inadequate heart rate was achieved during both of those studies. In 08/2010 he subsequently underwent a nuclear stress test which showed normal LV function, inferior wall hypokinesis, and inferior wall scar without ischemia. In March 2014, he underwent a stress echo which showed wall motion abnormalities at rest, worse with stress, though target heart rate was not achieved. Furthermore, his blood pressure dropped with exercise and returned to baseline in recovery. Post-recovery, his LV function returned to baseline. In 10/2012 he underwent repeat stress echo that showed abnormal wall motion at rest with EF of 45%, worse with stress (EF 35%), BP drop with stress, inadequate heart rate achieved. He underwent cardiac catheterization 12/2012 which showed left main normal, mid LAD 30% stenosis, distal LAD 30% stenosis, ostial LCx 50% stenosis, mid ramus 20% stenosis, 80% stenosis involving trifurcating OM3 with diffuse involvement of all 3 branches, prox  RCA 20% stenosis, mid RCA 20% stenosis, PDA 100% stenosis with left to right collaterals. Medical management was recommended. As part of his transplant evaluation he underwent repeat nuclear stress test in 01/2014 that showed severe HK of the inferior wall, and mild HK of the lateral wall, EF 49%. He has not followed up with cardiology since. He was removed from his transplant program in 02/2014.   He has been in his usual state of health leading up to his admission on 5/7. He reports a baseline weight that fluctuates between 140-150. He denies any chest pains, palpitations, SOB, or edema leading up to his admission. He eats a poor diet and does not get any exercise. He denies any orthopnea. No early satiety.   He presented to the [facility] ED on 5/5 with complaints of emesis x 1 day  And was diagnosed with acute gastritis. Labs showed a T bili of 1.3, normal WBC, hgb 10.3, normal lipase. He returned to the ED on 5/7 with complaints of nausea, vomiting. Now with cough, generalized fatigue, malaise, and fever of 103. CXR showed PNA, possibly 2/2 aspiration from emesis. Labs showed AST 113-->75-->50, ALT 62-->62, T bili 6.4-->6.6-->5.4-->4.3, WBC 20.4-->11.1-->9.2, hgb 9.2, lactic acid 2.2, blood cultures were drawn and have since grown GNR x 2 upon time of consult this morning. Flu was negative. Direct bili on 5/8 was 4.6-->3.3-->2.5. Haptoglobin 198. LDH 162. Hepatitis C negative, hepatitis B immune. Ammonia 22. TSH 0.162.   In HD on 5/9 he developed a tachycardic heart rate. Per vitals he had a heart rate of 125 at 10:45 AM on 5/9. Unfortunately, there are no rhythm strips and no  EKG of this rate. Patient is not hooked up to telemetry for review. There is question of possible a-fib. Since that time he has has heart rate in the 60-80 range, with one set of vitals in the 50s. He reports not feeling any palpitations or any SOB. No chest pain.    Past Medical History  Diagnosis Date  . ESRD (end stage renal  disease)     a. on HD M,W,F; b. previously on transplant list at [facility] - removed 2016  . DM2 (diabetes mellitus, type 2)   . CAD (coronary artery disease)     a. cardiac cath 2014: LM nl, mLAD 30%, dLAD 30%, ostLCx 50%, mid ramus 20% OM3 80% pRCA 20%, mRCA 20%, PDA 100% w/ left to right collats, medically managed   . Stroke       Most Recent Cardiac Studies: Cardiac cath 01/13/2013:  Left Heart Cardiac Catheterization   Results:  Coronary arteries: Left main: normal Left anterior descending: 30% mid, 30% distal Left circumflex: 50% ostium medium sized ramus, 20% mid, 80% involving trifurcating OM-3 with diffuse involvement of all 3 small branches Right coronary: 20% prox, 20% mid, 100% PDA with left to right collats  LVEDP: 79mmHg  A total of 40cc of dye was utilized.  Impressions and Recommendations: Two Vessel CAD Would favor medical therapy - will discuss with Dr. Erick Alley Discussed with Dr. Verne Spurr - they will make arrangements to see patient in follow-up in clinic   Nuclear stress test 01/26/2014:   FUNCTIONAL RESULTS (calculated via Gated SPECT)   Post Stress Image LV EF: 49 % Stress EDV: 137 ml EDVI: 68 ml/m TID: Stress ESV: 70 ml ESVI: 35 ml/m Wall Motion Wall Thickening Anterior: Normal Normal Apex: Normal Normal Inferior: Severe - Hypokinetic Abnormal Lateral: Mild - Hypokinetic Abnormal Septal: Normal Normal     Surgical History: History reviewed. No pertinent past surgical history.   Home Meds: Prior to Admission medications   Medication Sig Start Date End Date Taking? Authorizing Provider  aspirin 325 MG tablet Take 325 mg by mouth daily.   Yes Historical Provider, MD  sevelamer (RENAGEL) 800 MG tablet Take 3,200 mg by mouth 3 (three) times daily with meals.   Yes Historical Provider, MD  docusate sodium (COLACE) 100 MG capsule Take 1 capsule (100 mg total) by mouth 2 (two) times daily. 12/23/14 12/23/15  Lavonia Drafts, MD    Inpatient  Medications:  . heparin  5,000 Units Subcutaneous 3 times per day  . ipratropium-albuterol  3 mL Nebulization Q6H  . metoprolol tartrate  25 mg Oral BID  . piperacillin-tazobactam (ZOSYN)  IV  3.375 g Intravenous Q12H  . pneumococcal 23 valent vaccine  0.5 mL Intramuscular Tomorrow-1000  . sevelamer carbonate  3,200 mg Oral TID WC  . vancomycin  1,000 mg Intravenous Q M,W,F-HD      Allergies: No Known Allergies  History   Social History  . Marital Status: Married    Spouse Name: N/A  . Number of Children: N/A  . Years of Education: N/A   Occupational History  . Not on file.   Social History Main Topics  . Smoking status: Former Research scientist (life sciences)  . Smokeless tobacco: Not on file  . Alcohol Use: No  . Drug Use: Not on file  . Sexual Activity: Not on file   Other Topics Concern  . Not on file   Social History Narrative     Family History  Problem Relation Age of Onset  .  Hypertension    . Diabetes       Review of Systems: Review of Systems  Constitutional: Positive for fever, chills and malaise/fatigue. Negative for weight loss and diaphoresis.  HENT: Positive for congestion and sore throat.   Eyes: Negative for blurred vision, double vision, discharge and redness.  Respiratory: Positive for cough, shortness of breath and wheezing. Negative for hemoptysis and sputum production.   Cardiovascular: Negative for chest pain, palpitations, orthopnea, claudication, leg swelling and PND.  Gastrointestinal: Negative for heartburn, nausea, vomiting, abdominal pain, diarrhea, constipation, blood in stool and melena.  Musculoskeletal: Positive for myalgias. Negative for falls.  Skin: Negative for itching and rash.  Neurological: Positive for weakness and headaches. Negative for dizziness.  Psychiatric/Behavioral: The patient is not nervous/anxious.      Labs: No results for input(s): CKTOTAL, CKMB, TROPONINI in the last 72 hours. Lab Results  Component Value Date   WBC 8.9  12/27/2014   HGB 7.7* 12/27/2014   HCT 23.3* 12/27/2014   MCV 84.9 12/27/2014   PLT 111* 12/27/2014     Recent Labs Lab 12/28/14 0437  NA 137  K 4.3  CL 95*  CO2 30  BUN 54*  CREATININE 8.86*  CALCIUM 8.2*  PROT 6.2*  BILITOT 4.3*  ALKPHOS 62  ALT 56  AST 41  GLUCOSE 160*   No results found for: CHOL, HDL, LDLCALC, TRIG No results found for: DDIMER  Radiology/Studies:  Nm Hepatobiliary Liver Func  12/27/2014   CLINICAL DATA:  Elevated bilirubin.  EXAM: NUCLEAR MEDICINE HEPATOBILIARY IMAGING  TECHNIQUE: Sequential images of the abdomen were obtained out to 60 minutes following intravenous administration of radiopharmaceutical.  RADIOPHARMACEUTICALS:  7.18 mCi Tc-73m Choletec IV  COMPARISON:  Ultrasound of Dec 25, 2014.  FINDINGS: Uptake within hepatic parenchyma is noted. Filling of small bowel is noted. However, no uptake of filling of gallbladder is seen 90 minutes after radiotracer administration. This is concerning for cystic duct obstruction and possible cholecystitis.  IMPRESSION: No uptake within gallbladder is seen up to 90 minutes after radiotracer administration, concerning for cystic duct obstruction and possible cholecystitis.   Electronically Signed   By: Marijo Conception, M.D.   On: 12/27/2014 16:24   Dg Chest Port 1 View  12/25/2014   CLINICAL DATA:  Two normal 80s. Fever. Nausea vomiting for 1 week. Code sepsis.  EXAM: PORTABLE CHEST - 1 VIEW  COMPARISON:  05/30/2013  FINDINGS: Lung volumes are relatively low. There is hazy opacity at the right lung base which is likely atelectasis. Pneumonia is possible. Lungs otherwise clear. No pleural effusion or pneumothorax.  Cardiac silhouette normal in size and configuration. No mediastinal or hilar masses.  Bony thorax is demineralized but grossly intact.  IMPRESSION: 1. Area of hazy opacity at the right lung base, most likely atelectasis. Pneumonia is possible. 2. No other evidence of acute cardiopulmonary disease.    Electronically Signed   By: Lajean Manes M.D.   On: 12/25/2014 08:31   US Abdomen Limited Ruq  12/25/2014   CLINICAL DATA:  Elevated bilirubin.  Cholecystitis.  EXAM: US ABDOMEN LIMITED - RIGHT UPPER QUADRANT  COMPARISON:  None.  FINDINGS: Gallbladder:  Cholelithiasis and biliary sludge is present. The largest stone measures 7 mm. Some of the stones appear non mobile despite repositioning. The gallbladder wall is striated and measures 6 mm which is thickened. No sonographic Percell Miller sign is present.  Common bile duct:  Diameter: 6 mm, within normal limits for age.  Liver:  No focal lesion  identified. Within normal limits in parenchymal echogenicity.  RIGHT pleural effusion incidentally noted.  IMPRESSION: Biliary sludge in stones with nonspecific gallbladder wall thickening. In the absence of a sonographic Murphy sign, the significance is unclear. The sonographic findings are compatible with cholecystitis which may be chronic. In a patient with elevated bilirubin, consider HIDA scan for further assessment.   Electronically Signed   By: Dereck Ligas M.D.   On: 12/25/2014 15:37    EKG: EKG from 12/25/2014: NSR, 97 bpm, premature supraventricular complexes, considerable anterior artifact, inferolateral TWI. No EKG since.   Weights: Filed Weights   12/27/14 0500 12/27/14 1315 12/28/14 0411  Weight: 185 lb 14.4 oz (84.324 kg) 185 lb 10 oz (84.2 kg) 185 lb 4.8 oz (84.052 kg)     Physical Exam: Blood pressure 115/63, pulse 79, temperature 99 F (37.2 C), temperature source Oral, resp. rate 18, height 5\' 8"  (1.727 m), weight 185 lb 4.8 oz (84.052 kg), SpO2 93 %. Body mass index is 28.18 kg/(m^2). General: Well developed, well nourished, in no acute distress. Head: Normocephalic, atraumatic, sclera non-icteric, no xanthomas, nares are without discharge.  Neck: Negative for carotid bruits. JVD not elevated. Lungs: Clear bilaterally to auscultation without wheezes, rales, or rhonchi. Breathing is  unlabored. Heart: RRR with S1 S2. No murmurs, rubs, or gallops appreciated. Abdomen: Soft, non-tender, non-distended with normoactive bowel sounds. No hepatomegaly. No rebound/guarding. No obvious abdominal masses. Msk:  Strength and tone appear normal for age. Extremities: No clubbing or cyanosis. No edema.  Distal pedal pulses are 2+ and equal bilaterally. Neuro: Alert and oriented X 3. No facial asymmetry. No focal deficit. Moves all extremities spontaneously. Psych:  Responds to questions appropriately with a normal affect.    Assessment and Plan:  1. Possible arrhythmia: -Patient had a episode of tachycardia with heart rate into the 120s while in dialysis on 5/9. Unfortunately, there are no strips or EKG for review and he is not hooked up to telemetry.  -Will place patient on telemetry for evaluation for possible arrhythmia while he is admitted as an inpatient  -Would advise 30 day event monitor if no events are seen as an inpatient  -The stress of his illness certainly does place him at increased risk of developing new onset arrhythmia -Given no documented a-fib at this time and down trending hgb to level of 7.7 on 5/9 would hold long term anticoagulation at this time -Check EKG -TSH suppressed, check free T4 -Check magnesium  -K+ ok  2. CAD: -Last cardiac cath in 2014 as above, medically managed  -Nuclear stress test 01/2014 with severe HK of inferior wall and mild HK of lateral wall  -No angina  -Check echo  -No ischemic evaluation planned unless dictated by changes on echo  3. Sepsis/GNR bacteremia: -Secondary to aspiration PNA -On IV vancomycin and Zosyn per IM -Patient reports feeling better  4. Elevated LFTs/bilirubin: -Improving -GI on board, possible scope once stable -Trend  5. New onset anemia: -Maintain hgb 8-8.5 -GI planning eval in the future   6. ESRD on HD: -Per Renal  7. History of stroke: -Continue aspirin  8. DM2: -Per IM      Signed, DUNN,RYAN PA-C 12/28/2014, 8:22 AM    Patient seen and examined. Discussed on rounds with Christell Faith. Agree with above. No clear documentation of arrhythmia.  Certainly high risk of arrhythmia given possible chronic cholecystitis, pneumonia/sepsis, with  Positive blood cultures. ---Recommend we place him on telemetry. He has known coronary artery disease though this does  not appear to be an active issue. No further stress testing or catheterization needed. Bacteremia being managed by primary care/hospitalist service Underlying anemia, will monitor blood count --further recommendations will be documented if needed based on his telemetry findings  Esmond Plants, M.D., Ph.D.

## 2014-12-28 NOTE — Consult Note (Signed)
Subjective: Patient seen for abnormal liver associated enzymes. Patient is doing well today. He denies any nausea or vomiting. There is no abdominal pain.  Objective: Vital signs in last 24 hours: Temp:  [98.2 F (36.8 C)-99 F (37.2 C)] 98.2 F (36.8 C) (05/10 1610) Pulse Rate:  [72-84] 72 (05/10 1610) Resp:  [17-18] 17 (05/10 1610) BP: (107-117)/(61-63) 107/63 mmHg (05/10 1610) SpO2:  [91 %-99 %] 91 % (05/10 1610) Weight:  [84.052 kg (185 lb 4.8 oz)] 84.052 kg (185 lb 4.8 oz) (05/10 0411) Blood pressure 107/63, pulse 72, temperature 98.2 F (36.8 C), temperature source Oral, resp. rate 17, height 5\' 8"  (1.727 m), weight 84.052 kg (185 lb 4.8 oz), SpO2 91 %.   Intake/Output from previous day: 05/09 0701 - 05/10 0700 In: 300 [P.O.:300] Out: -230   Intake/Output this shift: Total I/O In: 180 [P.O.:180] Out: 0    General appearance:  64 year old male no acute distress Resp:  Clear to auscultation bilaterally Cardio:  Regular rate and rhythm without rub or gallop GI:  Soft nontender nondistended bowel sounds positive normoactive Extremities:  No clubbing cyanosis or edema   Lab Results: Results for orders placed or performed during the hospital encounter of 12/25/14 (from the past 24 hour(s))  Glucose, capillary     Status: Abnormal   Collection Time: 12/27/14  7:58 PM  Result Value Ref Range   Glucose-Capillary 154 (H) 70 - 99 mg/dL  Basic metabolic panel     Status: Abnormal   Collection Time: 12/28/14  4:37 AM  Result Value Ref Range   Sodium 137 135 - 145 mmol/L   Potassium 4.3 3.5 - 5.1 mmol/L   Chloride 95 (L) 101 - 111 mmol/L   CO2 30 22 - 32 mmol/L   Glucose, Bld 160 (H) 65 - 99 mg/dL   BUN 54 (H) 6 - 20 mg/dL   Creatinine, Ser 8.86 (H) 0.61 - 1.24 mg/dL   Calcium 8.2 (L) 8.9 - 10.3 mg/dL   GFR calc non Af Amer 6 (L) >60 mL/min   GFR calc Af Amer 7 (L) >60 mL/min   Anion gap 12 5 - 15  TSH     Status: Abnormal   Collection Time: 12/28/14  4:37 AM  Result  Value Ref Range   TSH 0.162 (L) 0.350 - 4.500 uIU/mL  Hepatic function panel     Status: Abnormal   Collection Time: 12/28/14  4:37 AM  Result Value Ref Range   Total Protein 6.2 (L) 6.5 - 8.1 g/dL   Albumin 2.3 (L) 3.5 - 5.0 g/dL   AST 41 15 - 41 U/L   ALT 56 17 - 63 U/L   Alkaline Phosphatase 62 38 - 126 U/L   Total Bilirubin 4.3 (H) 0.3 - 1.2 mg/dL   Bilirubin, Direct 2.5 (H) 0.1 - 0.5 mg/dL   Indirect Bilirubin 1.8 (H) 0.3 - 0.9 mg/dL  Glucose, capillary     Status: Abnormal   Collection Time: 12/28/14  7:32 AM  Result Value Ref Range   Glucose-Capillary 138 (H) 70 - 99 mg/dL   Comment 1 Notify RN   Glucose, capillary     Status: Abnormal   Collection Time: 12/28/14 11:25 AM  Result Value Ref Range   Glucose-Capillary 160 (H) 70 - 99 mg/dL   Comment 1 Notify RN   CBC     Status: Abnormal   Collection Time: 12/28/14  1:05 PM  Result Value Ref Range   WBC 8.1 3.8 - 10.6 K/uL  RBC 2.93 (L) 4.40 - 5.90 MIL/uL   Hemoglobin 8.1 (L) 13.0 - 18.0 g/dL   HCT 24.9 (L) 40.0 - 52.0 %   MCV 85.1 80.0 - 100.0 fL   MCH 27.6 26.0 - 34.0 pg   MCHC 32.4 32.0 - 36.0 g/dL   RDW 16.4 (H) 11.5 - 14.5 %   Platelets 127 (L) 150 - 440 K/uL  Comprehensive metabolic panel     Status: Abnormal   Collection Time: 12/28/14  1:05 PM  Result Value Ref Range   Sodium 137 135 - 145 mmol/L   Potassium 4.3 3.5 - 5.1 mmol/L   Chloride 94 (L) 101 - 111 mmol/L   CO2 30 22 - 32 mmol/L   Glucose, Bld 195 (H) 65 - 99 mg/dL   BUN 66 (H) 6 - 20 mg/dL   Creatinine, Ser 9.61 (H) 0.61 - 1.24 mg/dL   Calcium 8.2 (L) 8.9 - 10.3 mg/dL   Total Protein 6.1 (L) 6.5 - 8.1 g/dL   Albumin 2.2 (L) 3.5 - 5.0 g/dL   AST 34 15 - 41 U/L   ALT 49 17 - 63 U/L   Alkaline Phosphatase 61 38 - 126 U/L   Total Bilirubin 3.8 (H) 0.3 - 1.2 mg/dL   GFR calc non Af Amer 5 (L) >60 mL/min   GFR calc Af Amer 6 (L) >60 mL/min   Anion gap 13 5 - 15  Glucose, capillary     Status: Abnormal   Collection Time: 12/28/14  4:55 PM   Result Value Ref Range   Glucose-Capillary 176 (H) 70 - 99 mg/dL   Comment 1 Notify RN       Recent Labs  12/27/14 0729 12/27/14 0900 12/28/14 1305  WBC 9.2 8.9 8.1  HGB 8.1* 7.7* 8.1*  HCT 24.8* 23.3* 24.9*  PLT 117* 111* 127*   BMET  Recent Labs  12/27/14 0900 12/28/14 0437 12/28/14 1305  NA 136 137 137  K 4.2 4.3 4.3  CL 95* 95* 94*  CO2 26 30 30   GLUCOSE 163* 160* 195*  BUN 81* 54* 66*  CREATININE 13.19* 8.86* 9.61*  CALCIUM 8.0* 8.2* 8.2*   LFT  Recent Labs  12/28/14 0437 12/28/14 1305  PROT 6.2* 6.1*  ALBUMIN 2.3* 2.2*  AST 41 34  ALT 56 49  ALKPHOS 62 61  BILITOT 4.3* 3.8*  BILIDIR 2.5*  --   IBILI 1.8*  --    PT/INR No results for input(s): LABPROT, INR in the last 72 hours. Hepatitis Panel  Recent Labs  12/27/14 0729  HEPBSAG Negative*  HCVAB PENDING   C-Diff No results for input(s): CDIFFTOX in the last 72 hours. No results for input(s): CDIFFPCR in the last 72 hours.   Studies/Results: Nm Hepatobiliary Liver Func  12/27/2014   CLINICAL DATA:  Elevated bilirubin.  EXAM: NUCLEAR MEDICINE HEPATOBILIARY IMAGING  TECHNIQUE: Sequential images of the abdomen were obtained out to 60 minutes following intravenous administration of radiopharmaceutical.  RADIOPHARMACEUTICALS:  7.18 mCi Tc-66m Choletec IV  COMPARISON:  Ultrasound of Dec 25, 2014.  FINDINGS: Uptake within hepatic parenchyma is noted. Filling of small bowel is noted. However, no uptake of filling of gallbladder is seen 90 minutes after radiotracer administration. This is concerning for cystic duct obstruction and possible cholecystitis.  IMPRESSION: No uptake within gallbladder is seen up to 90 minutes after radiotracer administration, concerning for cystic duct obstruction and possible cholecystitis.   Electronically Signed   By: Marijo Conception, M.D.   On:  12/27/2014 16:24    Scheduled Inpatient Medications:   . heparin  5,000 Units Subcutaneous 3 times per day  . metoprolol  tartrate  25 mg Oral BID  . piperacillin-tazobactam (ZOSYN)  IV  3.375 g Intravenous Q12H  . pneumococcal 23 valent vaccine  0.5 mL Intramuscular Tomorrow-1000  . sevelamer carbonate  3,200 mg Oral TID WC  . vancomycin  1,000 mg Intravenous Q M,W,F-HD    Continuous Inpatient Infusions:     PRN Inpatient Medications:  acetaminophen **OR** acetaminophen, albuterol, menthol-cetylpyridinium, ondansetron **OR** ondansetron (ZOFRAN) IV, polyethylene glycol  Miscellaneous:   Assessment:  1. Abnormal liver enzymes. HIDA scan showing possible  cystic duct obstruction versus chronic cholecystitis. Enzymes are improving. 2. Anemia. Multiple etiologies is likely with anemia of chronic disease and possibility of GI blood loss. Stable  Plan:  1. No plans for cholecystectomy noted. It is of note the patient has had no abdominal pain since admission. 2. Recommend outpatient GI follow-up once he is over his pneumonia arrange EGD and colonoscopy for further evaluation. 3. Will sign off for now consult is needed.  Lollie Sails MD 12/28/2014, 5:37 PM

## 2014-12-28 NOTE — Progress Notes (Signed)
Physical Therapy Treatment Patient Details [name] MRN: [id number] DOB: [birth year] Today's Date: 12/28/2014    History of Present Illness patient presented to ER secondary to nausea/vomiting; admitted with sepsis related to aspiration pneumonia.  Hospital course complicated by new-onset A-fib, now rate controlled.  Also significant for R abdominal pain; abdominal US significant for gallbladder sludge and HIDA scan showing occluded cystic duct.  Per surgery consultation, patient not currently surgical candidate due to other medical issues.    PT Comments    Patient generally confused and lethargic this date.  Significant STM deficits; poor attention to and recall of task. Unable to consistently follow single-step commands (approx 50%).  Per wife report, cognitive status is "different than normal". RN informed/aware. Patient able to tolerate gait of increased distance, but requires use of RW and +2 for optimal safety.  Frequently bumping obstacles to L requiring physical assist from therapist for correction and to prevent LOB. Patient very unsteady and unsafe; very impulsive.  Continue to recommend transition to STR upon discharge.   Follow Up Recommendations  SNF     Equipment Recommendations  Rolling walker with 5" wheels    Recommendations for Other Services       Precautions / Restrictions Precautions Precautions: Fall Precaution Comments: No BP L UE Restrictions Weight Bearing Restrictions: No    Mobility  Bed Mobility Overal bed mobility: Needs Assistance Bed Mobility: Supine to Sit;Sit to Supine     Supine to sit: Min assist;Mod assist Sit to supine: Min assist      Transfers Overall transfer level: Needs assistance Equipment used: Rolling walker (2 wheeled) Transfers: Sit to/from Stand Sit to Stand: Min assist         General transfer comment: impulsive; uses bilat UEs to pull on RW  Ambulation/Gait Ambulation/Gait assistance: Min assist;+2  physical assistance Ambulation Distance (Feet): 100 Feet Assistive device: Rolling walker (2 wheeled)       General Gait Details: reciprocal stepping with decreased step height; forward flexed posture.  Very impulsive, often bumping obstacles/walls on L side of patient.   Stairs            Wheelchair Mobility    Modified Rankin (Stroke Patients Only)       Balance                                    Cognition Arousal/Alertness: Lethargic   Overall Cognitive Status: Impaired/Different from baseline Area of Impairment: Attention;Memory;Following commands;Safety/judgement;Awareness   Current Attention Level:  (unable to maintain attention to task; highly distractible by both internal/external environment) Memory: Decreased short-term memory Following Commands: Follows one step commands inconsistently (approx 50% time; often requiring demonstration from therapist) Safety/Judgement: Decreased awareness of safety;Decreased awareness of deficits          Exercises Other Exercises Other Exercises: Standing LE therex, 1x10, AROM for muscular strength/endurance/balance: heel raises, mini squats, marching.  Unable to follow multi-step commands for "hip 4 way".  constant, step by step cuing for attention to and recall of task at hand. Other Exercises: 5 times sit/stand, performed with RW, min assist: 37 seconds.  Requries use of bilat UEs to complete.  Significantly below age-matched norms and indicative of increased fall risk.    General Comments        Pertinent Vitals/Pain Pain Assessment: No/denies pain    Home Living  Prior Function            PT Goals (current goals can now be found in the care plan section) Acute Rehab PT Goals Patient Stated Goal: go home PT Goal Formulation: With patient Time For Goal Achievement: 01/09/15 Potential to Achieve Goals: Good Progress towards PT goals: Progressing toward goals     Frequency  Min 2X/week    PT Plan Current plan remains appropriate    Co-evaluation             End of Session Equipment Utilized During Treatment: Gait belt Activity Tolerance: Patient limited by fatigue Patient left: in bed;with call bell/phone within reach;with bed alarm set;with family/visitor present     Time: 2751-7001 PT Time Calculation (min) (ACUTE ONLY): 31 min  Charges:  $Gait Training: 8-22 mins $Therapeutic Exercise: 8-22 mins                    G Codes:     Nicko Daher H. Owens Shark, PT, DPT 12/28/2014, 3:45 PM [phone]

## 2014-12-28 NOTE — Progress Notes (Addendum)
Subjective:   Doing fair No acute events Asking about going home   Objective:  Vital signs in last 24 hours:  Temp:  [98.2 F (36.8 C)-99 F (37.2 C)] 98.2 F (36.8 C) (05/10 1610) Pulse Rate:  [72-84] 72 (05/10 1610) Resp:  [17-18] 17 (05/10 1610) BP: (107-117)/(61-63) 107/63 mmHg (05/10 1610) SpO2:  [91 %-99 %] 91 % (05/10 1610) Weight:  [84.052 kg (185 lb 4.8 oz)] 84.052 kg (185 lb 4.8 oz) (05/10 0411)  Weight change: -0.124 kg (-4.4 oz) Filed Weights   12/27/14 0500 12/27/14 1315 12/28/14 0411  Weight: 84.324 kg (185 lb 14.4 oz) 84.2 kg (185 lb 10 oz) 84.052 kg (185 lb 4.8 oz)    Intake/Output: I/O last 3 completed shifts: In: 300 [P.O.:300] Out: -230    Intake/Output this shift:  Total I/O In: 180 [P.O.:180] Out: 0   Physical Exam: General: NAD,   Head: Normocephalic, atraumatic. Moist oral mucosal membranes  Eyes: Anicteric,  Neck: Supple, trachea midline  Lungs:  Clear to auscultation  Heart: Regular rate and rhythm  Abdomen:  Soft, nontender,   Extremities:  + peripheral edema.  Neurologic: Nonfocal, moving all four extremities  Skin: No lesions  Access: AVF     Basic Metabolic Panel:  Recent Labs Lab 12/26/14 0707 12/27/14 0729 12/27/14 0900 12/28/14 0437 12/28/14 1305  NA 137 136 136 137 137  K 3.9 4.3 4.2 4.3 4.3  CL 96* 94* 95* 95* 94*  CO2 30 28 26 30 30   GLUCOSE 121* 147* 163* 160* 195*  BUN 52* 77* 81* 54* 66*  CREATININE 10.71* 12.65* 13.19* 8.86* 9.61*  CALCIUM 8.2* 8.2* 8.0* 8.2* 8.2*  MG  --  2.5*  --   --   --   PHOS  --   --  4.6  --   --     Liver Function Tests:  Recent Labs Lab 12/25/14 0735 12/26/14 0707 12/27/14 0729 12/27/14 0900 12/28/14 0437 12/28/14 1305  AST 113* 75* 50*  --  41 34  ALT 62 62 61  --  56 49  ALKPHOS 106 75 64  --  62 61  BILITOT 6.4* 6.6* 5.4*  --  4.3* 3.8*  PROT 6.7 5.3* 6.2*  --  6.2* 6.1*  ALBUMIN 2.9* 2.2* 2.2* 2.1* 2.3* 2.2*    Recent Labs Lab 12/23/14 1055  LIPASE 31     Recent Labs Lab 12/27/14 0730  AMMONIA 22    CBC:  Recent Labs Lab 12/23/14 1055 12/25/14 0735 12/26/14 0706 12/27/14 0729 12/27/14 0900 12/28/14 1305  WBC 6.0 20.4* 11.1* 9.2 8.9 8.1  NEUTROABS 4.8 18.6*  --   --   --   --   HGB 10.3* 9.2* 7.9* 8.1* 7.7* 8.1*  HCT 32.2* 29.5* 24.5* 24.8* 23.3* 24.9*  MCV 87.3 87.7 86.0 85.9 84.9 85.1  PLT 252 150 110* 117* 111* 127*    Cardiac Enzymes: No results for input(s): CKTOTAL, CKMB, CKMBINDEX, TROPONINI in the last 168 hours.  BNP: Invalid input(s): POCBNP  CBG:  Recent Labs Lab 12/27/14 0742 12/27/14 1640 12/27/14 1958 12/28/14 0732 12/28/14 1125  GLUCAP 131* 137* 154* 138* 160*    Microbiology: Results for orders placed or performed during the hospital encounter of 12/25/14  Blood Culture (routine x 2)     Status: None (Preliminary result)   Collection Time: 12/25/14  7:35 AM  Result Value Ref Range Status   Specimen Description BLOOD  Final   Special Requests NONE  Final  Culture   Final    GRAM NEGATIVE RODS IN BOTH AEROBIC AND ANAEROBIC BOTTLES CRITICAL RESULT CALLED TO, READ BACK BY AND VERIFIED WITH: ZACHARY ALLEN AT 2332 12/25/14 [facility] GRAM POSITIVE COCCI AEROBIC BOTTLE ONLY    Report Status PENDING  Incomplete  Blood Culture (routine x 2)     Status: None (Preliminary result)   Collection Time: 12/25/14  7:35 AM  Result Value Ref Range Status   Specimen Description BLOOD  Final   Special Requests NONE  Final   Culture   Final    GRAM NEGATIVE RODS IN BOTH AEROBIC AND ANAEROBIC BOTTLES CRITICAL VALUE NOTED.  VALUE IS CONSISTENT WITH PREVIOUSLY REPORTED AND CALLED VALUE. GRAM POSITIVE COCCI AEROBIC BOTTLE ONLY CRITICAL VALUE NOTED.  VALUE IS CONSISTENT WITH PREVIOUSLY REPORTED AND CALLED VALUE. IDENTIFICATION TO FOLLOW SUSCEPTIBILITIES TO FOLLOW    Report Status PENDING  Incomplete  Influenza A&B Antigens [facility])     Status: None   Collection Time: 12/25/14 12:47 PM  Result Value Ref Range  Status   Influenza A ([facility]) NEGATIVE  Final   Influenza B ([facility]) NEGATIVE  Final    Coagulation Studies: No results for input(s): LABPROT, INR in the last 72 hours.  Urinalysis: No results for input(s): COLORURINE, LABSPEC, PHURINE, GLUCOSEU, HGBUR, BILIRUBINUR, KETONESUR, PROTEINUR, UROBILINOGEN, NITRITE, LEUKOCYTESUR in the last 72 hours.  Invalid input(s): APPERANCEUR    Imaging: Nm Hepatobiliary Liver Func  12/27/2014   CLINICAL DATA:  Elevated bilirubin.  EXAM: NUCLEAR MEDICINE HEPATOBILIARY IMAGING  TECHNIQUE: Sequential images of the abdomen were obtained out to 60 minutes following intravenous administration of radiopharmaceutical.  RADIOPHARMACEUTICALS:  7.18 mCi Tc-35m Choletec IV  COMPARISON:  Ultrasound of Dec 25, 2014.  FINDINGS: Uptake within hepatic parenchyma is noted. Filling of small bowel is noted. However, no uptake of filling of gallbladder is seen 90 minutes after radiotracer administration. This is concerning for cystic duct obstruction and possible cholecystitis.  IMPRESSION: No uptake within gallbladder is seen up to 90 minutes after radiotracer administration, concerning for cystic duct obstruction and possible cholecystitis.   Electronically Signed   By: Marijo Conception, M.D.   On: 12/27/2014 16:24     Medications:     . heparin  5,000 Units Subcutaneous 3 times per day  . metoprolol tartrate  25 mg Oral BID  . piperacillin-tazobactam (ZOSYN)  IV  3.375 g Intravenous Q12H  . pneumococcal 23 valent vaccine  0.5 mL Intramuscular Tomorrow-1000  . sevelamer carbonate  3,200 mg Oral TID WC  . vancomycin  1,000 mg Intravenous Q M,W,F-HD   acetaminophen **OR** acetaminophen, albuterol, menthol-cetylpyridinium, ondansetron **OR** ondansetron (ZOFRAN) IV, polyethylene glycol  Assessment/ Plan:  64 y.o. male End stage renal disease, hypertension, diabetes mellitus type II, CVA, coronary artery disease who presents to [facility] on 12/25/2014 for right lower lobe  pneumonia  MWF [facility] [place]   1. End Stage Renal Disease: MWF. Next HD on Wednesday   2. Pneumonia with sepsis, Gram neg bacteremia  Abx as per IM physician  3. Anemia of chronic kidney disease: hemoglobin low, decreasing - epo with HD treatment   4. Secondary Hyperparathyroidism:  - sevelamer with meals.     LOS: 3 [place] 5/10/20164:17 PM

## 2014-12-28 NOTE — Consult Note (Signed)
Surgical Consultation    Luis Walter is an 64 y.o. male.  HPI: *See this patient by primary doctor for an obstructed cystic duct on HIDA scan. Patient denies any abdominal pain at this time he has no further nausea or vomiting. He is being treated for active pneumonia. He has multiple other medical problems. He is able to eat without any abdominal pain and denies ever having right upper quadrant pain.  Past Medical History  Diagnosis Date  . ESRD (end stage renal disease)     a. on HD M,W,F; b. previously on transplant list at [facility] - removed 2016  . DM2 (diabetes mellitus, type 2)   . CAD (coronary artery disease)     a. cardiac cath 2014: LM nl, mLAD 30%, dLAD 30%, ostLCx 50%, mid ramus 20% OM3 80% pRCA 20%, mRCA 20%, PDA 100% w/ left to right collats, medically managed   . Stroke     History reviewed. No pertinent past surgical history.  Family History  Problem Relation Age of Onset  . Hypertension    . Diabetes      Social History:  reports that he has quit smoking. He does not have any smokeless tobacco history on file. He reports that he does not drink alcohol. His drug history is not on file.  Allergies: No Known Allergies  Medications:    Review of Systems:  Review of Systems  Constitutional: Positive for malaise/fatigue.  HENT: Negative.   Eyes: Negative.   Respiratory: Positive for cough, shortness of breath and wheezing.   Cardiovascular: Negative.   Gastrointestinal: Positive for nausea and vomiting. Negative for heartburn, abdominal pain and blood in stool.  Genitourinary: Negative.   Musculoskeletal: Negative.   Skin: Negative.   Neurological: Positive for weakness.  Endo/Heme/Allergies: Negative.   Psychiatric/Behavioral: Negative.        BP 117/61 mmHg  Pulse 79  Temp(Src) 98.7 F (37.1 C) (Oral)  Resp 18  Ht 5' 8"  (1.727 m)  Wt 84.052 kg (185 lb 4.8 oz)  BMI 28.18 kg/m2  SpO2 91%  Physical Exam  Constitutional:  Chronically ill  appearing African-American male  patient  HENT:  Head: Normocephalic.  Eyes: Pupils are equal, round, and reactive to light.  Neck: Normal range of motion. Neck supple.  Cardiovascular: Normal rate.   Pulmonary/Chest: No respiratory distress.  Abdominal: He exhibits no distension and no mass. There is no tenderness. There is no rebound and no guarding.  Musculoskeletal: Normal range of motion.  Left forearm vascular access  Neurological: He is alert.  Skin: Skin is warm and dry.  Psychiatric: Affect normal.      Results for orders placed or performed during the hospital encounter of 12/25/14 (from the past 48 hour(s))  Glucose, capillary     Status: Abnormal   Collection Time: 12/26/14  4:12 PM  Result Value Ref Range   Glucose-Capillary 170 (H) 70 - 99 mg/dL  Glucose, capillary     Status: Abnormal   Collection Time: 12/26/14 10:42 PM  Result Value Ref Range   Glucose-Capillary 165 (H) 70 - 99 mg/dL  CBC     Status: Abnormal   Collection Time: 12/27/14  7:29 AM  Result Value Ref Range   WBC 9.2 3.8 - 10.6 K/uL   RBC 2.89 (L) 4.40 - 5.90 MIL/uL   Hemoglobin 8.1 (L) 13.0 - 18.0 g/dL   HCT 24.8 (L) 40.0 - 52.0 %   MCV 85.9 80.0 - 100.0 fL   MCH 28.2 26.0 -  34.0 pg   MCHC 32.8 32.0 - 36.0 g/dL   RDW 16.4 (H) 11.5 - 14.5 %   Platelets 117 (L) 150 - 440 K/uL  Comprehensive metabolic panel     Status: Abnormal   Collection Time: 12/27/14  7:29 AM  Result Value Ref Range   Sodium 136 135 - 145 mmol/L   Potassium 4.3 3.5 - 5.1 mmol/L   Chloride 94 (L) 101 - 111 mmol/L   CO2 28 22 - 32 mmol/L   Glucose, Bld 147 (H) 65 - 99 mg/dL   BUN 77 (H) 6 - 20 mg/dL   Creatinine, Ser 12.65 (H) 0.61 - 1.24 mg/dL   Calcium 8.2 (L) 8.9 - 10.3 mg/dL   Total Protein 6.2 (L) 6.5 - 8.1 g/dL   Albumin 2.2 (L) 3.5 - 5.0 g/dL   AST 50 (H) 15 - 41 U/L   ALT 61 17 - 63 U/L   Alkaline Phosphatase 64 38 - 126 U/L   Total Bilirubin 5.4 (H) 0.3 - 1.2 mg/dL   GFR calc non Af Amer 4 (L) >60 mL/min    GFR calc Af Amer 4 (L) >60 mL/min    Comment: (NOTE) The eGFR has been calculated using the CKD EPI equation. This calculation has not been validated in all clinical situations. eGFR's persistently <60 mL/min signify possible Chronic Kidney Disease.    Anion gap 14 5 - 15  Hepatitis B surface antigen     Status: Abnormal   Collection Time: 12/27/14  7:29 AM  Result Value Ref Range   Hepatitis B Surface Ag Negative (A) Negative    Comment: (NOTE) Performed At: [facility] [place], [place] [id number] Lindon Romp MD [id number]   Hepatitis B surface antibody     Status: None   Collection Time: 12/27/14  7:29 AM  Result Value Ref Range   Hep B S Ab Reactive     Comment: (NOTE)              Non Reactive: Inconsistent with immunity,                            less than 10 mIU/mL              Reactive:     Consistent with immunity,                            greater than 9.9 mIU/mL Performed At: [facility] [place], [place] [id number] Lindon Romp MD [id number]   Hepatitis B core antibody, total     Status: None   Collection Time: 12/27/14  7:29 AM  Result Value Ref Range   Hep B Core Total Ab Negative Negative    Comment: (NOTE) Performed At: [facility] [place], [place] [id number] Lindon Romp MD AF:[id number]   Hepatitis c antibody (reflex)     Status: None   Collection Time: 12/27/14  7:29 AM  Result Value Ref Range   HCV Ab 0.1 0.0 - 0.9 s/co ratio    Comment: (NOTE) Performed At: [facility] [address] [place], [place] [id number] Lindon Romp MD [id number]    HCV Ab PENDING NEGATIVE  Bilirubin, direct     Status: Abnormal   Collection Time: 12/27/14  7:29 AM  Result Value Ref Range   Bilirubin, Direct 3.3 (H) 0.1 -  0.5 mg/dL  Iron and TIBC     Status: Abnormal   Collection Time: 12/27/14  7:29 AM  Result Value Ref Range   Iron 73 45 - 182 ug/dL   TIBC  121 (L) 250 - 450 ug/dL   Saturation Ratios 60 (H) 17.9 - 39.5 %   UIBC 48 ug/dL  T4, free     Status: Abnormal   Collection Time: 12/27/14  7:29 AM  Result Value Ref Range   Free T4 1.36 (H) 0.61 - 1.12 ng/dL  Magnesium     Status: Abnormal   Collection Time: 12/27/14  7:29 AM  Result Value Ref Range   Magnesium 2.5 (H) 1.7 - 2.4 mg/dL  Ammonia     Status: None   Collection Time: 12/27/14  7:30 AM  Result Value Ref Range   Ammonia 22 9 - 35 umol/L  Glucose, capillary     Status: Abnormal   Collection Time: 12/27/14  7:42 AM  Result Value Ref Range   Glucose-Capillary 131 (H) 70 - 99 mg/dL   Comment 1 Notify RN   Hepatitis B surface antibody     Status: None   Collection Time: 12/27/14  8:00 AM  Result Value Ref Range   Hepatitis B-Post 317.8 Immunity>9.9 mIU/mL    Comment: (NOTE)  Status of Immunity                     Anti-HBs Level  ------------------                     -------------- Inconsistent with Immunity                   0.0 - 9.9 Consistent with Immunity                          >9.9 Performed At: [facility] [place], [place] [id number] Lindon Romp MD [id number]   Renal function panel     Status: Abnormal   Collection Time: 12/27/14  9:00 AM  Result Value Ref Range   Sodium 136 135 - 145 mmol/L   Potassium 4.2 3.5 - 5.1 mmol/L   Chloride 95 (L) 101 - 111 mmol/L   CO2 26 22 - 32 mmol/L   Glucose, Bld 163 (H) 65 - 99 mg/dL   BUN 81 (H) 6 - 20 mg/dL   Creatinine, Ser 13.19 (H) 0.61 - 1.24 mg/dL   Calcium 8.0 (L) 8.9 - 10.3 mg/dL   Phosphorus 4.6 2.5 - 4.6 mg/dL   Albumin 2.1 (L) 3.5 - 5.0 g/dL   GFR calc non Af Amer 4 (L) >60 mL/min   GFR calc Af Amer 4 (L) >60 mL/min    Comment: (NOTE) The eGFR has been calculated using the CKD EPI equation. This calculation has not been validated in all clinical situations. eGFR's persistently <60 mL/min signify possible Chronic Kidney Disease.    Anion gap 15 5 - 15  CBC     Status:  Abnormal   Collection Time: 12/27/14  9:00 AM  Result Value Ref Range   WBC 8.9 3.8 - 10.6 K/uL   RBC 2.74 (L) 4.40 - 5.90 MIL/uL   Hemoglobin 7.7 (L) 13.0 - 18.0 g/dL   HCT 23.3 (L) 40.0 - 52.0 %   MCV 84.9 80.0 - 100.0 fL   MCH 28.1 26.0 - 34.0 pg   MCHC 33.1 32.0 - 36.0 g/dL  RDW 16.6 (H) 11.5 - 14.5 %   Platelets 111 (L) 150 - 440 K/uL  Parathyroid hormone, intact (no Ca)     Status: Abnormal   Collection Time: 12/27/14  9:00 AM  Result Value Ref Range   PTH 268 (H) 15 - 65 pg/mL    Comment: (NOTE) Performed At: [facility] [place], [place] [id number] Lindon Romp MD OY:[id number]   Glucose, capillary     Status: Abnormal   Collection Time: 12/27/14  4:40 PM  Result Value Ref Range   Glucose-Capillary 137 (H) 70 - 99 mg/dL   Comment 1 Notify RN   Glucose, capillary     Status: Abnormal   Collection Time: 12/27/14  7:58 PM  Result Value Ref Range   Glucose-Capillary 154 (H) 70 - 99 mg/dL  Basic metabolic panel     Status: Abnormal   Collection Time: 12/28/14  4:37 AM  Result Value Ref Range   Sodium 137 135 - 145 mmol/L   Potassium 4.3 3.5 - 5.1 mmol/L   Chloride 95 (L) 101 - 111 mmol/L   CO2 30 22 - 32 mmol/L   Glucose, Bld 160 (H) 65 - 99 mg/dL   BUN 54 (H) 6 - 20 mg/dL   Creatinine, Ser 8.86 (H) 0.61 - 1.24 mg/dL   Calcium 8.2 (L) 8.9 - 10.3 mg/dL   GFR calc non Af Amer 6 (L) >60 mL/min   GFR calc Af Amer 7 (L) >60 mL/min    Comment: (NOTE) The eGFR has been calculated using the CKD EPI equation. This calculation has not been validated in all clinical situations. eGFR's persistently <60 mL/min signify possible Chronic Kidney Disease.    Anion gap 12 5 - 15  TSH     Status: Abnormal   Collection Time: 12/28/14  4:37 AM  Result Value Ref Range   TSH 0.162 (L) 0.350 - 4.500 uIU/mL  Hepatic function panel     Status: Abnormal   Collection Time: 12/28/14  4:37 AM  Result Value Ref Range   Total Protein 6.2 (L) 6.5 - 8.1 g/dL    Albumin 2.3 (L) 3.5 - 5.0 g/dL   AST 41 15 - 41 U/L   ALT 56 17 - 63 U/L   Alkaline Phosphatase 62 38 - 126 U/L   Total Bilirubin 4.3 (H) 0.3 - 1.2 mg/dL   Bilirubin, Direct 2.5 (H) 0.1 - 0.5 mg/dL   Indirect Bilirubin 1.8 (H) 0.3 - 0.9 mg/dL  Glucose, capillary     Status: Abnormal   Collection Time: 12/28/14  7:32 AM  Result Value Ref Range   Glucose-Capillary 138 (H) 70 - 99 mg/dL   Comment 1 Notify RN   Glucose, capillary     Status: Abnormal   Collection Time: 12/28/14 11:25 AM  Result Value Ref Range   Glucose-Capillary 160 (H) 70 - 99 mg/dL   Comment 1 Notify RN   CBC     Status: Abnormal   Collection Time: 12/28/14  1:05 PM  Result Value Ref Range   WBC 8.1 3.8 - 10.6 K/uL   RBC 2.93 (L) 4.40 - 5.90 MIL/uL   Hemoglobin 8.1 (L) 13.0 - 18.0 g/dL   HCT 24.9 (L) 40.0 - 52.0 %   MCV 85.1 80.0 - 100.0 fL   MCH 27.6 26.0 - 34.0 pg   MCHC 32.4 32.0 - 36.0 g/dL   RDW 16.4 (H) 11.5 - 14.5 %   Platelets 127 (L) 150 - 440 K/uL  Comprehensive metabolic panel     Status: Abnormal   Collection Time: 12/28/14  1:05 PM  Result Value Ref Range   Sodium 137 135 - 145 mmol/L   Potassium 4.3 3.5 - 5.1 mmol/L   Chloride 94 (L) 101 - 111 mmol/L   CO2 30 22 - 32 mmol/L   Glucose, Bld 195 (H) 65 - 99 mg/dL   BUN 66 (H) 6 - 20 mg/dL   Creatinine, Ser 9.61 (H) 0.61 - 1.24 mg/dL   Calcium 8.2 (L) 8.9 - 10.3 mg/dL   Total Protein 6.1 (L) 6.5 - 8.1 g/dL   Albumin 2.2 (L) 3.5 - 5.0 g/dL   AST 34 15 - 41 U/L   ALT 49 17 - 63 U/L   Alkaline Phosphatase 61 38 - 126 U/L   Total Bilirubin 3.8 (H) 0.3 - 1.2 mg/dL   GFR calc non Af Amer 5 (L) >60 mL/min   GFR calc Af Amer 6 (L) >60 mL/min    Comment: (NOTE) The eGFR has been calculated using the CKD EPI equation. This calculation has not been validated in all clinical situations. eGFR's persistently <60 mL/min signify possible Chronic Kidney Disease.    Anion gap 13 5 - 15   Nm Hepatobiliary Liver Func  12/27/2014   CLINICAL DATA:  Elevated  bilirubin.  EXAM: NUCLEAR MEDICINE HEPATOBILIARY IMAGING  TECHNIQUE: Sequential images of the abdomen were obtained out to 60 minutes following intravenous administration of radiopharmaceutical.  RADIOPHARMACEUTICALS:  7.18 mCi Tc-58mCholetec IV  COMPARISON:  Ultrasound of Dec 25, 2014.  FINDINGS: Uptake within hepatic parenchyma is noted. Filling of small bowel is noted. However, no uptake of filling of gallbladder is seen 90 minutes after radiotracer administration. This is concerning for cystic duct obstruction and possible cholecystitis.  IMPRESSION: No uptake within gallbladder is seen up to 90 minutes after radiotracer administration, concerning for cystic duct obstruction and possible cholecystitis.   Electronically Signed   By: JMarijo Conception M.D.   On: 12/27/2014 16:24    Assessment/Plan: This is a patient who is admitted with pneumonia and nausea and vomiting his nausea vomiting is abated he's never had right-sided abdominal pain. He does have elevated liver function tests and a HIDA scan that suggests occluded cystic duct. This may represent chronic cholecystitis. But currently the patient has no abdominal pain. In light of his multiple medical problems and need for acute admission to the hospital I would not recommend surgery at this point. The risks of a general anesthetic in the face of his other medical problems far outweigh the benefits in a patient who is currently asymptomatic without abdominal pain. I'll be happy to follow the patient while he is in the hospital. This was discussed with primary doctor  RFlorene Glen MD, FACS

## 2014-12-28 NOTE — Consult Note (Signed)
ENDOCRINOLOGY CONSULTATION  REFERRING PHYSICIAN:  Dustin Flock, MD CONSULTING PHYSICIAN:  A. Lavone Orn, MD PRIMARY CARE PHYSICIAN: Miguel Aschoff, MD  Chief complaint Abnormal thyroid function tests  History of present illness Luis Walter is a 64 y.o. seen in consultation due to abnormal thyroid labs. He has a h/o ESRD on hemodialysis, h/o CVA, and diet controlled diabetes admitted to hospital on 12/25/14 with sepsis attributed to aspiration pneumonia. Labs on 5/9 notable for elevated free T4 of 1.36 ng/dl and low TSH level of 0.162 uIU/ml.   Pt was interviewed with assistance of his wife. He denies a know prior history of thyroid disease. He denies neck pain or fever. No known use of amiodarone, lithium, glucocorticoids, or exposure to iodinated contrast dye. No heart racing or palpitations. No tremor. No anxiety or tremor or heat intolerance. No unintentional weight loss. No known family history of thyroid disease.   Medical history Past Medical History  Diagnosis Date  . ESRD (end stage renal disease)     a. on HD M,W,F; b. previously on transplant list at [facility] - removed 2016  . DM2 (diabetes mellitus, type 2)   . CAD (coronary artery disease)     a. cardiac cath 2014: LM nl, mLAD 30%, dLAD 30%, ostLCx 50%, mid ramus 20% OM3 80% pRCA 20%, mRCA 20%, PDA 100% w/ left to right collats, medically managed   . Stroke     Medications . heparin  5,000 Units Subcutaneous 3 times per day  . metoprolol tartrate  25 mg Oral BID  . piperacillin-tazobactam (ZOSYN)  IV  3.375 g Intravenous Q12H  . pneumococcal 23 valent vaccine  0.5 mL Intramuscular Tomorrow-1000  . sevelamer carbonate  3,200 mg Oral TID WC  . vancomycin  1,000 mg Intravenous Q M,W,F-HD     Social history History  Substance Use Topics  . Smoking status: Former Research scientist (life sciences)  . Smokeless tobacco: Not on file  . Alcohol Use: No     Family history Family History  Problem Relation Age of Onset  . Hypertension    .  Diabetes       Review of systems GENERAL:  Patient denies fevers.  Patient has fatigue.   NEUROLOGIC:  Patient denies headaches. EYES:  Patient has had blurred vision. NECK:  Patient denies neck swelling, neck pain or difficulty with swallowing. CARDIAC:  Patient denies chest pain or palpitations. RESPIRATORY:  Patient denies SOB.  Patient denies a cough.   GASTROINTESTINAL: Reports good appetite.  Patient denies abdominal pain, nausea and vomiting. EXTREMITIES:  Patient denies leg swelling. HEME:  Patient denies easy bruisability. SKIN:  Patient denies any rash.   Exam BP 107/63 mmHg  Pulse 72  Temp(Src) 98.2 F (36.8 C) (Oral)  Resp 17  Ht 5\' 8"  (1.727 m)  Wt 84.052 kg (185 lb 4.8 oz)  BMI 28.18 kg/m2  SpO2 91% GEN: well developed, well nourished AAM in NAD. HEENT: No proptosis, lid lag or stare.   NECK: supple, trachea midline, thyroid not enlarged and no palpable nodules or TTP. RESPIRATORY: clear bilaterally, no wheeze, good inspiratory effort. CV: No carotid bruits, RRR. MUSCULOSKELETAL: no tremor of outstretched hands ABD: soft, NT/ND  EXT: no peripheral edema.  SKIN: no dermatopathy or rash or acanthosis nigricans.   LYMPH: no submandibular or supraclavicular LAD NEURO: Drowsy. Arousable. PSYC:Difficult to get to cooperate with exam.   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 This can occur due to acute illness and is typically asymptomatic and resolves as clinical condition improves. Would be helpful to check total T3, and thyrotropin receptor antibody (TRAb) levels as well. Anticipate if T3 is low-normal and TRAb not elevated, then will repeat free T4 and TSH in 2-3 days to assess if they are normalizing rather than worsening.  Follow up to be determined based on lab  results.  Thank you for the kind request for consultation.

## 2014-12-29 ENCOUNTER — Inpatient Hospital Stay: Payer: Medicare Other

## 2014-12-29 DIAGNOSIS — J129 Viral pneumonia, unspecified: Secondary | ICD-10-CM

## 2014-12-29 DIAGNOSIS — I499 Cardiac arrhythmia, unspecified: Secondary | ICD-10-CM | POA: Diagnosis not present

## 2014-12-29 LAB — CULTURE, BLOOD (ROUTINE X 2)

## 2014-12-29 LAB — GLUCOSE, CAPILLARY
GLUCOSE-CAPILLARY: 140 mg/dL — AB (ref 70–99)
GLUCOSE-CAPILLARY: 133 mg/dL — AB (ref 70–99)
GLUCOSE-CAPILLARY: 150 mg/dL — AB (ref 70–99)
Glucose-Capillary: 143 mg/dL — ABNORMAL HIGH (ref 70–99)

## 2014-12-29 LAB — VANCOMYCIN, TROUGH: VANCOMYCIN TR: 18 ug/mL (ref 10–20)

## 2014-12-29 LAB — COMPREHENSIVE METABOLIC PANEL
ALK PHOS: 60 U/L (ref 38–126)
ALT: 43 U/L (ref 17–63)
AST: 25 U/L (ref 15–41)
Albumin: 2.2 g/dL — ABNORMAL LOW (ref 3.5–5.0)
Anion gap: 14 (ref 5–15)
BUN: 78 mg/dL — ABNORMAL HIGH (ref 6–20)
CO2: 29 mmol/L (ref 22–32)
Calcium: 8.3 mg/dL — ABNORMAL LOW (ref 8.9–10.3)
Chloride: 93 mmol/L — ABNORMAL LOW (ref 101–111)
Creatinine, Ser: 10.74 mg/dL — ABNORMAL HIGH (ref 0.61–1.24)
GFR calc non Af Amer: 4 mL/min — ABNORMAL LOW (ref >60)
GFR, EST AFRICAN AMERICAN: 5 mL/min — AB (ref >60)
GLUCOSE: 141 mg/dL — AB (ref 65–99)
Potassium: 4.1 mmol/L (ref 3.5–5.1)
Sodium: 136 mmol/L (ref 135–145)
Total Bilirubin: 3.1 mg/dL — ABNORMAL HIGH (ref 0.3–1.2)
Total Protein: 6.4 g/dL — ABNORMAL LOW (ref 6.5–8.1)

## 2014-12-29 LAB — CBC
HEMATOCRIT: 25.3 % — AB (ref 40.0–52.0)
Hemoglobin: 8.1 g/dL — ABNORMAL LOW (ref 13.0–18.0)
MCH: 27.3 pg (ref 26.0–34.0)
MCHC: 32.2 g/dL (ref 32.0–36.0)
MCV: 84.9 fL (ref 80.0–100.0)
Platelets: 153 10*3/uL (ref 150–440)
RBC: 2.98 MIL/uL — ABNORMAL LOW (ref 4.40–5.90)
RDW: 16.9 % — ABNORMAL HIGH (ref 11.5–14.5)
WBC: 9.5 10*3/uL (ref 3.8–10.6)

## 2014-12-29 LAB — BLOOD GAS, ARTERIAL
Acid-Base Excess: 9 mmol/L — ABNORMAL HIGH (ref 0.0–3.0)
Bicarbonate: 32.7 mEq/L — ABNORMAL HIGH (ref 21.0–28.0)
FIO2: 0.21 %
O2 SAT: 91.8 %
PCO2 ART: 40 mmHg (ref 32.0–48.0)
PH ART: 7.52 — AB (ref 7.350–7.450)
Patient temperature: 37
pO2, Arterial: 56 mmHg — ABNORMAL LOW (ref 83.0–108.0)

## 2014-12-29 LAB — AMMONIA: Ammonia: 24 umol/L (ref 9–35)

## 2014-12-29 LAB — MAGNESIUM: Magnesium: 2.7 mg/dL — ABNORMAL HIGH (ref 1.7–2.4)

## 2014-12-29 MED ORDER — EPOETIN ALFA 20000 UNIT/ML IJ SOLN
20000.0000 [IU] | INTRAMUSCULAR | Status: DC
  Filled 2014-12-29 (×2): qty 1

## 2014-12-29 MED ORDER — METOPROLOL TARTRATE 50 MG PO TABS
50.0000 mg | ORAL_TABLET | Freq: Four times a day (QID) | ORAL | Status: DC
  Administered 2014-12-29 – 2014-12-31 (×7): 50 mg via ORAL
  Filled 2014-12-29 (×8): qty 1

## 2014-12-29 NOTE — Progress Notes (Signed)
Physical Therapy Treatment Patient Details [name] MRN: [id number] DOB: 11-17-1950 Today's Date: 12/29/2014    History of Present Illness Aspiration PNA and ESRD presenting to hosp with elevated creatinine and occluded cystic duct, non surgical patient.  New a-fib and controlled rate.    PT Comments    Pt is easily agitiated today as he apparently slept poorly and did not eat breakfast.  He is not very insightful as to his situation and needs, and was not aware of asking for other breakfast options to avoid going to HD hungry.  He is very much in need of SNF for follow up esp due to his limited independence and safety with gait and transfers.  Follow Up Recommendations  SNF     Equipment Recommendations  Rolling walker with 5" wheels    Recommendations for Other Services       Precautions / Restrictions Precautions Precautions: Fall Precaution Comments: No BP L UE Restrictions Weight Bearing Restrictions: No    Mobility  Bed Mobility Overal bed mobility: Needs Assistance Bed Mobility: Supine to Sit;Sit to Supine     Supine to sit: Min assist Sit to supine: Min assist   General bed mobility comments: reluctant to get in bed and asks to have BSC near, very unaware of his fall risk  Transfers Overall transfer level: Needs assistance Equipment used: Rolling walker (2 wheeled);1 person hand held assist Transfers: Sit to/from [facility] Sit to Stand: Min assist Stand pivot transfers: Min guard;Min assist       General transfer comment: reminders needed for sequence and safety  Ambulation/Gait Ambulation/Gait assistance: Min guard;Min assist;+2 physical assistance;+2 safety/equipment Ambulation Distance (Feet): 200 Feet Assistive device: Rolling walker (2 wheeled);2 person hand held assist (for safety) Gait Pattern/deviations: Shuffle;Wide base of support Gait velocity: reduced Gait velocity interpretation: Below normal speed for  age/gender General Gait Details: Low DF of steps implies weakness, has wide base and by the end of gait was headed into L side of wall and obstacles, very uninsightful that he should not be doing this   Science writer    Modified Rankin (Stroke Patients Only)       Balance Overall balance assessment: Needs assistance Sitting-balance support: Feet supported Sitting balance-Leahy Scale: Fair   Postural control: Posterior lean Standing balance support: Bilateral upper extremity supported Standing balance-Leahy Scale: Poor                      Cognition Arousal/Alertness: Lethargic Behavior During Therapy: Agitated Overall Cognitive Status: Impaired/Different from baseline Area of Impairment: Following commands;Safety/judgement;Awareness;Problem solving   Current Attention Level: Divided   Following Commands: Follows one step commands inconsistently Safety/Judgement: Decreased awareness of safety;Decreased awareness of deficits Awareness: Intellectual Problem Solving: Slow processing;Difficulty sequencing;Requires verbal cues      Exercises      General Comments General comments (skin integrity, edema, etc.): Has some limited cognitive issues that are concerning for home.  SNF is plan although pt is not addressing this at all and reports poor sleep, not eating well.      Pertinent Vitals/Pain Pain Assessment: No/denies pain    Home Living                      Prior Function            PT Goals (current goals can now be found in the care plan section) Acute  Rehab PT Goals Patient Stated Goal: go home Progress towards PT goals: Progressing toward goals    Frequency  Min 2X/week    PT Plan Current plan remains appropriate    Co-evaluation             End of Session Equipment Utilized During Treatment: Gait belt Activity Tolerance: Patient limited by fatigue Patient left: in bed;with call bell/phone within  reach;with bed alarm set;with nursing/sitter in room     Time: 0902-0926 PT Time Calculation (min) (ACUTE ONLY): 24 min  Charges:  $Gait Training: 8-22 mins $Therapeutic Activity: 8-22 mins                    G Codes:      Ramond Dial 2015-01-10, 9:47 AM   Mee Hives, PT MS Acute Rehab Dept. Number: [phone] [phone] and [place] [phone]

## 2014-12-29 NOTE — Progress Notes (Signed)
Subjective:   Patient seen during dialysis Tolerating well  Resting quietly    Objective:  Vital signs in last 24 hours:  Temp:  [97.6 F (36.4 C)-99.2 F (37.3 C)] 98.3 F (36.8 C) (05/11 1400) Pulse Rate:  [66-84] 79 (05/11 1408) Resp:  [12-29] 18 (05/11 1400) BP: (109-133)/(58-78) 126/68 mmHg (05/11 1406) SpO2:  [90 %-99 %] 99 % (05/11 1408) Weight:  [85.639 kg (188 lb 12.8 oz)] 85.639 kg (188 lb 12.8 oz) (05/11 0442)  Weight change: 1.439 kg (3 lb 2.8 oz) Filed Weights   12/27/14 1315 12/28/14 0411 12/29/14 0442  Weight: 84.2 kg (185 lb 10 oz) 84.052 kg (185 lb 4.8 oz) 85.639 kg (188 lb 12.8 oz)    Intake/Output: I/O last 3 completed shifts: In: 270 [P.O.:270] Out: 0    Intake/Output this shift:  Total I/O In: 195 [P.O.:120; IV Piggyback:75] Out: 1500 [Other:1500]  Physical Exam: General: NAD,   Head: Normocephalic, atraumatic. Moist oral mucosal membranes  Eyes: Anicteric,  Neck: Supple, trachea midline  Lungs:  Clear to auscultation  Heart: Regular rate and rhythm  Abdomen:  Soft, nontender,   Extremities:  + peripheral edema.  Neurologic: Nonfocal, moving all four extremities  Skin: No lesions  Access: AVF     Basic Metabolic Panel:  Recent Labs Lab 12/27/14 0729 12/27/14 0900 12/28/14 0437 12/28/14 1305 12/29/14 0616  NA 136 136 137 137 136  K 4.3 4.2 4.3 4.3 4.1  CL 94* 95* 95* 94* 93*  CO2 28 26 30 30 29   GLUCOSE 147* 163* 160* 195* 141*  BUN 77* 81* 54* 66* 78*  CREATININE 12.65* 13.19* 8.86* 9.61* 10.74*  CALCIUM 8.2* 8.0* 8.2* 8.2* 8.3*  MG 2.5*  --   --   --  2.7*  PHOS  --  4.6  --   --   --     Liver Function Tests:  Recent Labs Lab 12/26/14 0707 12/27/14 0729 12/27/14 0900 12/28/14 0437 12/28/14 1305 12/29/14 0616  AST 75* 50*  --  41 34 25  ALT 62 61  --  56 49 43  ALKPHOS 75 64  --  62 61 60  BILITOT 6.6* 5.4*  --  4.3* 3.8* 3.1*  PROT 5.3* 6.2*  --  6.2* 6.1* 6.4*  ALBUMIN 2.2* 2.2* 2.1* 2.3* 2.2* 2.2*     Recent Labs Lab 12/23/14 1055  LIPASE 31    Recent Labs Lab 12/27/14 0730 12/29/14 1527  AMMONIA 22 24    CBC:  Recent Labs Lab 12/23/14 1055 12/25/14 0735 12/26/14 0706 12/27/14 0729 12/27/14 0900 12/28/14 1305 12/29/14 0616  WBC 6.0 20.4* 11.1* 9.2 8.9 8.1 9.5  NEUTROABS 4.8 18.6*  --   --   --   --   --   HGB 10.3* 9.2* 7.9* 8.1* 7.7* 8.1* 8.1*  HCT 32.2* 29.5* 24.5* 24.8* 23.3* 24.9* 25.3*  MCV 87.3 87.7 86.0 85.9 84.9 85.1 84.9  PLT 252 150 110* 117* 111* 127* 153    Cardiac Enzymes: No results for input(s): CKTOTAL, CKMB, CKMBINDEX, TROPONINI in the last 168 hours.  BNP: Invalid input(s): POCBNP  CBG:  Recent Labs Lab 12/28/14 1125 12/28/14 1655 12/28/14 2054 12/29/14 0718 12/29/14 1640  GLUCAP 160* 176* 146* 140* 133*    Microbiology: Results for orders placed or performed during the hospital encounter of 12/25/14  Blood Culture (routine x 2)     Status: None (Preliminary result)   Collection Time: 12/25/14  7:35 AM  Result Value Ref Range Status  Specimen Description BLOOD  Final   Special Requests NONE  Final   Culture   Final    GRAM NEGATIVE RODS IN BOTH AEROBIC AND ANAEROBIC BOTTLES CRITICAL RESULT CALLED TO, READ BACK BY AND VERIFIED WITH: ZACHARY ALLEN AT 2332 12/25/14 [facility] GRAM POSITIVE COCCI AEROBIC BOTTLE ONLY    Report Status PENDING  Incomplete  Blood Culture (routine x 2)     Status: None (Preliminary result)   Collection Time: 12/25/14  7:35 AM  Result Value Ref Range Status   Specimen Description BLOOD  Final   Special Requests NONE  Final   Culture   Final    GRAM NEGATIVE RODS IN BOTH AEROBIC AND ANAEROBIC BOTTLES CRITICAL VALUE NOTED.  VALUE IS CONSISTENT WITH PREVIOUSLY REPORTED AND CALLED VALUE. GRAM POSITIVE COCCI AEROBIC BOTTLE ONLY CRITICAL VALUE NOTED.  VALUE IS CONSISTENT WITH PREVIOUSLY REPORTED AND CALLED VALUE. IDENTIFICATION TO FOLLOW SUSCEPTIBILITIES TO FOLLOW    Report Status PENDING  Incomplete   Influenza A&B Antigens [facility])     Status: None   Collection Time: 12/25/14 12:47 PM  Result Value Ref Range Status   Influenza A ([facility]) NEGATIVE  Final   Influenza B ([facility]) NEGATIVE  Final    Coagulation Studies: No results for input(s): LABPROT, INR in the last 72 hours.  Urinalysis: No results for input(s): COLORURINE, LABSPEC, PHURINE, GLUCOSEU, HGBUR, BILIRUBINUR, KETONESUR, PROTEINUR, UROBILINOGEN, NITRITE, LEUKOCYTESUR in the last 72 hours.  Invalid input(s): APPERANCEUR    Imaging: Ct Head Wo Contrast  12/29/2014   CLINICAL DATA:  There is concern that patient is very sleepy and lethargic on. Normally he is very functional and he works as well.  EXAM: CT HEAD WITHOUT CONTRAST  TECHNIQUE: Contiguous axial images were obtained from the base of the skull through the vertex without intravenous contrast.  COMPARISON:  05/30/2013  FINDINGS: The ventricles are normal in configuration. There is ventricular enlargement, greater than sulcal enlargement, stable from the prior studies, consistent with mild to moderate atrophy. No hydrocephalus.  There is a larger area of encephalomalacia throughout much of the right middle cerebral artery territory reflecting an old infarct. There is a smaller area of posterior left frontal and adjacent left parietal lobe encephalomalacia reflecting a second area of chronic infarction. There is some encephalomalacia in the posterior medial right occipital parietal lobe reflecting an area of old right PCA distribution infarction. There are no parenchymal masses or mass effect. There is no evidence of a recent transcortical infarct.  There are no extra-axial masses or abnormal fluid collections.  There is no intracranial hemorrhage.  Visualized sinuses and mastoid air cells are clear.  IMPRESSION: 1. No acute intracranial abnormalities. 2. Atrophy and multiple old infarcts, unchanged from the prior imaging.   Electronically Signed   By: Lajean Manes M.D.   On:  12/29/2014 16:10     Medications:     . heparin  5,000 Units Subcutaneous 3 times per day  . metoprolol tartrate  50 mg Oral Q6H  . piperacillin-tazobactam (ZOSYN)  IV  3.375 g Intravenous Q12H  . pneumococcal 23 valent vaccine  0.5 mL Intramuscular Tomorrow-1000  . sevelamer carbonate  3,200 mg Oral TID WC  . vancomycin  1,000 mg Intravenous Q M,W,F-HD   acetaminophen **OR** acetaminophen, albuterol, menthol-cetylpyridinium, ondansetron **OR** ondansetron (ZOFRAN) IV, polyethylene glycol  Assessment/ Plan:  64 y.o. male End stage renal disease, hypertension, diabetes mellitus type II, CVA, coronary artery disease who [facility] to [facility] on 12/25/2014 for right lower lobe pneumonia  MWF  [facility] [place]   1. End Stage Renal Disease: MWF. Patient seen during treatment  Tolerating well/   2. Pneumonia with sepsis, Gram neg bacteremia  Abx as per IM team  3. Anemia of chronic kidney disease: hemoglobin low, decreasing - start SQ EPO  4. Secondary Hyperparathyroidism:  - sevelamer with meals.     LOS: 4 Damesha Lawler 5/11/20165:13 PM

## 2014-12-29 NOTE — Progress Notes (Signed)
Patient: Luis Walter / Admit Date: 12/25/2014 / Date of Encounter: 12/29/2014, 2:55 PM   Subjective: Sleepy this evening. Not very conversant. Multiple family members and nurse in room. Denies any chest pain, palpitations, SOB. Echo on 5/10 showed EF 35-40%, moderate diffuse hypokinesis, regional wall motion abnormalities cannot be excluded, mild MR, moderately dilated LA, mildly dilated RV, mild TR, PASP normal. (Nuclear stress test from 01/2014 showed EF 495, severe HK inferior wall, mild HK lateral wall).   Review of Systems: Review of Systems  Constitutional: Positive for weight loss and malaise/fatigue. Negative for fever, chills and diaphoresis.  Respiratory: Positive for cough, sputum production, shortness of breath and wheezing. Negative for hemoptysis.   Cardiovascular: Negative for chest pain, palpitations, orthopnea, claudication, leg swelling and PND.  Gastrointestinal: Negative for diarrhea.  Musculoskeletal: Positive for myalgias.  Neurological: Positive for weakness. Negative for focal weakness and headaches.    Objective: Telemetry: NSR, 70s, frequent PACs and PVCs Physical Exam: Blood pressure 126/68, pulse 79, temperature 98.3 F (36.8 C), temperature source Oral, resp. rate 18, height 5\' 8"  (1.727 m), weight 188 lb 12.8 oz (85.639 kg), SpO2 99 %. Body mass index is 28.71 kg/(m^2). General: Sleepy, prior left sided facial droop. Head: Normocephalic, atraumatic, sclera non-icteric, no xanthomas, nares are without discharge. Neck: Negative for carotid bruits. JVP not elevated. Lungs: Clear bilaterally to auscultation without wheezes, rales, or rhonchi. Breathing is unlabored. Heart: Irregular rhythm, normal S1 S2 without murmurs, rubs, or gallops.  Abdomen: Soft, non-tender, non-distended with normoactive bowel sounds. No rebound/guarding. Extremities: No clubbing or cyanosis. No edema.  Neuro: Lethargic. Psych:  Lethargic.   Intake/Output Summary (Last 24 hours)  at 12/29/14 1455 Last data filed at 12/29/14 1400  Gross per 24 hour  Intake    225 ml  Output   1500 ml  Net  -1275 ml    Inpatient Medications:  . heparin  5,000 Units Subcutaneous 3 times per day  . metoprolol tartrate  25 mg Oral BID  . piperacillin-tazobactam (ZOSYN)  IV  3.375 g Intravenous Q12H  . pneumococcal 23 valent vaccine  0.5 mL Intramuscular Tomorrow-1000  . sevelamer carbonate  3,200 mg Oral TID WC  . vancomycin  1,000 mg Intravenous Q M,W,F-HD   Infusions:    Labs:  Recent Labs  12/27/14 0729 12/27/14 0900  12/28/14 1305 12/29/14 0616  NA 136 136  < > 137 136  K 4.3 4.2  < > 4.3 4.1  CL 94* 95*  < > 94* 93*  CO2 28 26  < > 30 29  GLUCOSE 147* 163*  < > 195* 141*  BUN 77* 81*  < > 66* 78*  CREATININE 12.65* 13.19*  < > 9.61* 10.74*  CALCIUM 8.2* 8.0*  < > 8.2* 8.3*  MG 2.5*  --   --   --  2.7*  PHOS  --  4.6  --   --   --   < > = values in this interval not displayed.  Recent Labs  12/28/14 1305 12/29/14 0616  AST 34 25  ALT 49 43  ALKPHOS 61 60  BILITOT 3.8* 3.1*  PROT 6.1* 6.4*  ALBUMIN 2.2* 2.2*    Recent Labs  12/28/14 1305 12/29/14 0616  WBC 8.1 9.5  HGB 8.1* 8.1*  HCT 24.9* 25.3*  MCV 85.1 84.9  PLT 127* 153   No results for input(s): CKTOTAL, CKMB, TROPONINI in the last 72 hours. Invalid input(s): POCBNP No results for input(s): HGBA1C in  the last 72 hours.   Weights: Filed Weights   12/27/14 1315 12/28/14 0411 12/29/14 0442  Weight: 185 lb 10 oz (84.2 kg) 185 lb 4.8 oz (84.052 kg) 188 lb 12.8 oz (85.639 kg)     Radiology/Studies:  Nm Hepatobiliary Liver Func  12/27/2014   CLINICAL DATA:  Elevated bilirubin.  EXAM: NUCLEAR MEDICINE HEPATOBILIARY IMAGING  TECHNIQUE: Sequential images of the abdomen were obtained out to 60 minutes following intravenous administration of radiopharmaceutical.  RADIOPHARMACEUTICALS:  7.18 mCi Tc-49m Choletec IV  COMPARISON:  Ultrasound of Dec 25, 2014.  FINDINGS: Uptake within hepatic parenchyma  is noted. Filling of small bowel is noted. However, no uptake of filling of gallbladder is seen 90 minutes after radiotracer administration. This is concerning for cystic duct obstruction and possible cholecystitis.  IMPRESSION: No uptake within gallbladder is seen up to 90 minutes after radiotracer administration, concerning for cystic duct obstruction and possible cholecystitis.   Electronically Signed   By: Marijo Conception, M.D.   On: 12/27/2014 16:24   Dg Chest Port 1 View  12/25/2014   CLINICAL DATA:  Two normal 80s. Fever. Nausea vomiting for 1 week. Code sepsis.  EXAM: PORTABLE CHEST - 1 VIEW  COMPARISON:  05/30/2013  FINDINGS: Lung volumes are relatively low. There is hazy opacity at the right lung base which is likely atelectasis. Pneumonia is possible. Lungs otherwise clear. No pleural effusion or pneumothorax.  Cardiac silhouette normal in size and configuration. No mediastinal or hilar masses.  Bony thorax is demineralized but grossly intact.  IMPRESSION: 1. Area of hazy opacity at the right lung base, most likely atelectasis. Pneumonia is possible. 2. No other evidence of acute cardiopulmonary disease.   Electronically Signed   By: Lajean Manes M.D.   On: 12/25/2014 08:31   US Abdomen Limited Ruq  12/25/2014   CLINICAL DATA:  Elevated bilirubin.  Cholecystitis.  EXAM: US ABDOMEN LIMITED - RIGHT UPPER QUADRANT  COMPARISON:  None.  FINDINGS: Gallbladder:  Cholelithiasis and biliary sludge is present. The largest stone measures 7 mm. Some of the stones appear non mobile despite repositioning. The gallbladder wall is striated and measures 6 mm which is thickened. No sonographic Percell Miller sign is present.  Common bile duct:  Diameter: 6 mm, within normal limits for age.  Liver:  No focal lesion identified. Within normal limits in parenchymal echogenicity.  RIGHT pleural effusion incidentally noted.  IMPRESSION: Biliary sludge in stones with nonspecific gallbladder wall thickening. In the absence of a  sonographic Murphy sign, the significance is unclear. The sonographic findings are compatible with cholecystitis which may be chronic. In a patient with elevated bilirubin, consider HIDA scan for further assessment.   Electronically Signed   By: Dereck Ligas M.D.   On: 12/25/2014 15:37     Assessment and Plan  1. Arrhythmia: -Patient had a episode of tachycardia with heart rate into the 120s while in dialysis on 5/9. Unfortunately, there are no strips or EKG for review and he is not hooked up to telemetry.  -Telemetry shows NSR with frequent PACs, PVCs, and blocked PACs -Change Lopressor 25 mg bid to Lopressor 25 mg q 6 hours while inpatient  -Would advise 30 day event monitor if no events are seen as an inpatient  -The stress of his illness certainly does place him at increased risk of developing new onset arrhythmia -No documented indication for long term anticoagulation at this time, continue to monitor  -EKG from 5/10 with NSR with frequent premature supraventricular complexes  -  Possible transient thyroiditis in the setting of infection, endocrine recommends repeat studies in 2-3 days  -Magnesium ok -K+ ok  2. CAD: -Last cardiac cath in 2014 as above, medically managed  -Nuclear stress test 01/2014 with severe HK of inferior wall and mild HK of lateral wall  -No angina  -Echo showed EF 35-40%, moderate diffuse HK, RWMA cannot be excluded, mild MR, moderately dilated LA, RV mildly dilated, mild TR, PASP normal -Patient has known severe HK of inferior wall and mild lateral HK dating back to 01/2014 nuclear stress test. EF at that time back nuclear stress test was 49%  3. Sepsis/GNR bacteremia: -Secondary to aspiration PNA -On IV vancomycin and Zosyn per IM -Patient reports feeling better  4. Elevated LFTs/bilirubin: -Improving -GI on board, possible scope once stable -Trend  5. New onset anemia: -Maintain hgb 8-8.5 -GI planning eval in the future   6. ESRD on HD: -Per  Renal  7. History of stroke: -Continue aspirin  8. DM2: -Per IM  Signed, Christell Faith, PA-C 12/29/2014 4:51 PM

## 2014-12-29 NOTE — Progress Notes (Signed)
Inpatient Diabetes Program Recommendations  AACE/ADA: New Consensus Statement on Inpatient Glycemic Control (2013)  Target Ranges:  Prepandial:   less than 140 mg/dL      Peak postprandial:   less than 180 mg/dL (1-2 hours)      Critically ill patients:  140 - 180 mg/dL   Results for TREVONN, HALLUM (MRN [id number]) as of 12/29/2014 11:36  Ref. Range 12/28/2014 07:32 12/28/2014 11:25 12/28/2014 16:55 12/28/2014 20:54 12/29/2014 07:18  Glucose-Capillary Latest Ref Range: 70-99 mg/dL 138 (H) 160 (H) 176 (H) 146 (H) 140 (H)   Diabetes history: DM2 Outpatient Diabetes medications: None; diet controlled Current orders for Inpatient glycemic control: None  Inpatient Diabetes Program Recommendations Correction (SSI): While inpatient, please use Glycemic Control Order Set and order CBGs with Novolog sensitive correction scale ACHS.  Thanks, Barnie Alderman, RN, MSN, CCRN, CDE Diabetes Coordinator Inpatient Diabetes Program [phone] (Team Pager from [place] to [place]) [phone] ([facility] office) [phone] [facility] office)

## 2014-12-29 NOTE — Progress Notes (Signed)
PRE HD   

## 2014-12-29 NOTE — Progress Notes (Signed)
HD START 

## 2014-12-29 NOTE — Consult Note (Signed)
Subjective: Patient seen for abnormal liver associated enzymes and anemia. Patient has been hemodynamically stable. He is tolerating by mouth. He denies any nausea vomiting or abdominal pain.  Objective: Vital signs in last 24 hours: Temp:  [97.6 F (36.4 C)-99.2 F (37.3 C)] 98.3 F (36.8 C) (05/11 1400) Pulse Rate:  [66-84] 79 (05/11 1408) Resp:  [12-29] 18 (05/11 1400) BP: (109-133)/(58-78) 126/68 mmHg (05/11 1406) SpO2:  [90 %-99 %] 99 % (05/11 1408) Weight:  [85.639 kg (188 lb 12.8 oz)] 85.639 kg (188 lb 12.8 oz) (05/11 0442) Blood pressure 126/68, pulse 79, temperature 98.3 F (36.8 C), temperature source Oral, resp. rate 18, height 5\' 8"  (1.727 m), weight 85.639 kg (188 lb 12.8 oz), SpO2 99 %.   Intake/Output from previous day: 05/10 0701 - 05/11 0700 In: 210 [P.O.:210] Out: 0   Intake/Output this shift: Total I/O In: 195 [P.O.:120; IV Piggyback:75] Out: 1500 [Other:1500]   General appearance:  Well-appearing African-American male in no acute distress Resp:  Laterally clear to auscultation Cardio:  Regular rate and rhythm without rub or gallop GI:  Soft nontender nondistended bowel sounds positive and normoactive Extremities:     Lab Results: Results for orders placed or performed during the hospital encounter of 12/25/14 (from the past 24 hour(s))  Glucose, capillary     Status: Abnormal   Collection Time: 12/28/14  8:54 PM  Result Value Ref Range   Glucose-Capillary 146 (H) 70 - 99 mg/dL   Comment 1 Notify RN   Comprehensive metabolic panel     Status: Abnormal   Collection Time: 12/29/14  6:16 AM  Result Value Ref Range   Sodium 136 135 - 145 mmol/L   Potassium 4.1 3.5 - 5.1 mmol/L   Chloride 93 (L) 101 - 111 mmol/L   CO2 29 22 - 32 mmol/L   Glucose, Bld 141 (H) 65 - 99 mg/dL   BUN 78 (H) 6 - 20 mg/dL   Creatinine, Ser 10.74 (H) 0.61 - 1.24 mg/dL   Calcium 8.3 (L) 8.9 - 10.3 mg/dL   Total Protein 6.4 (L) 6.5 - 8.1 g/dL   Albumin 2.2 (L) 3.5 - 5.0 g/dL    AST 25 15 - 41 U/L   ALT 43 17 - 63 U/L   Alkaline Phosphatase 60 38 - 126 U/L   Total Bilirubin 3.1 (H) 0.3 - 1.2 mg/dL   GFR calc non Af Amer 4 (L) >60 mL/min   GFR calc Af Amer 5 (L) >60 mL/min   Anion gap 14 5 - 15  CBC     Status: Abnormal   Collection Time: 12/29/14  6:16 AM  Result Value Ref Range   WBC 9.5 3.8 - 10.6 K/uL   RBC 2.98 (L) 4.40 - 5.90 MIL/uL   Hemoglobin 8.1 (L) 13.0 - 18.0 g/dL   HCT 25.3 (L) 40.0 - 52.0 %   MCV 84.9 80.0 - 100.0 fL   MCH 27.3 26.0 - 34.0 pg   MCHC 32.2 32.0 - 36.0 g/dL   RDW 16.9 (H) 11.5 - 14.5 %   Platelets 153 150 - 440 K/uL  Magnesium     Status: Abnormal   Collection Time: 12/29/14  6:16 AM  Result Value Ref Range   Magnesium 2.7 (H) 1.7 - 2.4 mg/dL  Glucose, capillary     Status: Abnormal   Collection Time: 12/29/14  7:18 AM  Result Value Ref Range   Glucose-Capillary 140 (H) 70 - 99 mg/dL  Vancomycin, trough  Status: None   Collection Time: 12/29/14 12:12 PM  Result Value Ref Range   Vancomycin Tr 18 10 - 20 ug/mL  Ammonia     Status: None   Collection Time: 12/29/14  3:27 PM  Result Value Ref Range   Ammonia 24 9 - 35 umol/L  Blood gas, arterial     Status: Abnormal   Collection Time: 12/29/14  4:30 PM  Result Value Ref Range   FIO2 0.21 %   pH, Arterial 7.52 (H) 7.350 - 7.450   pCO2 arterial 40 32.0 - 48.0 mmHg   pO2, Arterial 56 (L) 83.0 - 108.0 mmHg   Bicarbonate 32.7 (H) 21.0 - 28.0 mEq/L   Acid-Base Excess 9.0 (H) 0.0 - 3.0 mmol/L   O2 Saturation 91.8 %   Patient temperature 37.0    Collection site RIGHT RADIAL    Sample type ARTERIAL DRAW    Allens test (pass/fail) YES (A) PASS  Glucose, capillary     Status: Abnormal   Collection Time: 12/29/14  4:40 PM  Result Value Ref Range   Glucose-Capillary 133 (H) 70 - 99 mg/dL      Recent Labs  12/27/14 0900 12/28/14 1305 12/29/14 0616  WBC 8.9 8.1 9.5  HGB 7.7* 8.1* 8.1*  HCT 23.3* 24.9* 25.3*  PLT 111* 127* 153   BMET  Recent Labs   12/28/14 0437 12/28/14 1305 12/29/14 0616  NA 137 137 136  K 4.3 4.3 4.1  CL 95* 94* 93*  CO2 30 30 29   GLUCOSE 160* 195* 141*  BUN 54* 66* 78*  CREATININE 8.86* 9.61* 10.74*  CALCIUM 8.2* 8.2* 8.3*   LFT  Recent Labs  12/28/14 0437  12/29/14 0616  PROT 6.2*  < > 6.4*  ALBUMIN 2.3*  < > 2.2*  AST 41  < > 25  ALT 56  < > 43  ALKPHOS 62  < > 60  BILITOT 4.3*  < > 3.1*  BILIDIR 2.5*  --   --   IBILI 1.8*  --   --   < > = values in this interval not displayed. PT/INR No results for input(s): LABPROT, INR in the last 72 hours. Hepatitis Panel  Recent Labs  12/27/14 0729  HEPBSAG Negative*  HCVAB PENDING   C-Diff No results for input(s): CDIFFTOX in the last 72 hours. No results for input(s): CDIFFPCR in the last 72 hours.   Studies/Results: Ct Head Wo Contrast  12/29/2014   CLINICAL DATA:  There is concern that patient is very sleepy and lethargic on. Normally he is very functional and he works as well.  EXAM: CT HEAD WITHOUT CONTRAST  TECHNIQUE: Contiguous axial images were obtained from the base of the skull through the vertex without intravenous contrast.  COMPARISON:  05/30/2013  FINDINGS: The ventricles are normal in configuration. There is ventricular enlargement, greater than sulcal enlargement, stable from the prior studies, consistent with mild to moderate atrophy. No hydrocephalus.  There is a larger area of encephalomalacia throughout much of the right middle cerebral artery territory reflecting an old infarct. There is a smaller area of posterior left frontal and adjacent left parietal lobe encephalomalacia reflecting a second area of chronic infarction. There is some encephalomalacia in the posterior medial right occipital parietal lobe reflecting an area of old right PCA distribution infarction. There are no parenchymal masses or mass effect. There is no evidence of a recent transcortical infarct.  There are no extra-axial masses or abnormal fluid collections.   There is no  intracranial hemorrhage.  Visualized sinuses and mastoid air cells are clear.  IMPRESSION: 1. No acute intracranial abnormalities. 2. Atrophy and multiple old infarcts, unchanged from the prior imaging.   Electronically Signed   By: Lajean Manes M.D.   On: 12/29/2014 16:10    Scheduled Inpatient Medications:   . epoetin (EPOGEN/PROCRIT) injection  20,000 Units Subcutaneous Weekly  . heparin  5,000 Units Subcutaneous 3 times per day  . metoprolol tartrate  50 mg Oral Q6H  . piperacillin-tazobactam (ZOSYN)  IV  3.375 g Intravenous Q12H  . pneumococcal 23 valent vaccine  0.5 mL Intramuscular Tomorrow-1000  . sevelamer carbonate  3,200 mg Oral TID WC  . vancomycin  1,000 mg Intravenous Q M,W,F-HD    Continuous Inpatient Infusions:     PRN Inpatient Medications:  acetaminophen **OR** acetaminophen, albuterol, menthol-cetylpyridinium, ondansetron **OR** ondansetron (ZOFRAN) IV, polyethylene glycol  Miscellaneous:   Assessment:  1 abnormal liver associated enzymes. Patient with abnormal HIDA scan consistent with cystic duct obstruction versus chronic cholelithiasis. No abdominal pain. No plans for surgery. LFTs are rapidly approaching normal. 2 anemia mixed etiology most likely anemia of chronic disease secondary to his renal disease.  Plan:  1 recheck LFTs in a.m. 2 will need GI follow-up as an outpatient plan for EGD and colonoscopy. He does have a history of large adenomatous colon polyps and has not had a repeat scope over 5 years. 3 sign off for now reconsulted needed.   Lollie Sails MD 12/29/2014, 5:50 PM

## 2014-12-29 NOTE — Progress Notes (Signed)
POST HD 

## 2014-12-29 NOTE — Progress Notes (Signed)
Pt noted forgetful and confused at times. Bed exit alarm maintained. IVF's infusing without difficulty. Wife at bedside for support. LUE with +2 edema noted, elevated on pillow for support. Dialysis MWF. Call light and phone in reach. Will continue to monitor per M.D. Orders.

## 2014-12-29 NOTE — Progress Notes (Signed)
[facility] [place] [name]    DATE OF ADMISSION:  12/25/2014  S:  Abdominal pain improved, billirubin little better, pt tolerating diet, wife and mother at bedside. There is concern that patient is very sleepy and lethargic on. Normally he is very functional and he works as well.     REVIEW OF SYSTEMS:   CONSTITUTIONAL: Patient afebrile, complains of fatigue and weakness EYES: No blurred or double vision.  EARS, NOSE, AND THROAT: No tinnitus or ear pain.  RESPIRATORY: Reports cough, shortness of breath, productive sputum CARDIOVASCULAR: No chest pain, orthopnea, edema.  GASTROINTESTINAL: nausea and vomiting   improved, abdominal pain resolved GENITOURINARY: No dysuria, hematuria.  ENDOCRINE: No polyuria, nocturia,  HEMATOLOGY: No anemia, easy bruising or bleeding SKIN: No rash or lesion. MUSCULOSKELETAL: No joint pain or arthritis.   NEUROLOGIC: No tingling, numbness, weakness, currently improved. PSYCHIATRY: No anxiety or depression.   MEDICATIONS AT HOME:  Prior to Admission medications   Medication Sig Start Date End Date Taking? Authorizing Provider  aspirin 325 MG tablet Take 325 mg by mouth daily.   Yes Historical Provider, MD  sevelamer (RENAGEL) 800 MG tablet Take 3,200 mg by mouth 3 (three) times daily with meals.   Yes Historical Provider, MD  docusate sodium (COLACE) 100 MG capsule Take 1 capsule (100 mg total) by mouth 2 (two) times daily. 12/23/14 12/23/15  Lavonia Drafts, MD      PHYSICAL EXAMINATION:   VITAL SIGNS: Blood pressure 126/68, pulse 79, temperature 98.3 F (36.8 C), temperature source Oral, resp. rate 18, height 5\' 8"  (1.727 m), weight 85.639 kg (188 lb 12.8 oz), SpO2 99 %.  GENERAL:  64 y.o.-year-old patient lying in the bed , frail, ill-appearing EYES: Pupils equal, round, reactive to light and accommodation. No scleral icterus. Extraocular muscles intact.  HEENT: Head atraumatic,  normocephalic. Oropharynx and nasopharynx clear.  NECK:  Supple, no jugular venous distention. No thyroid enlargement, no tenderness.  LUNGS: Normal breath sounds bilaterally, no wheezing, bibasilar Rales. No use of accessory muscles of respiration.  CARDIOVASCULAR: S1, S2 normal. No murmurs, rubs, or gallops.  ABDOMEN: Soft, nontender, nondistended. Bowel sounds present. No organomegaly or mass.  EXTREMITIES: No pedal edema, cyanosis, or clubbing.  NEUROLOGIC: Cranial nerves II through XII are intact. Muscle strength 5/5 in all extremities. Sensation intact. Patient's little lethargic but able answer questions PSYCHIATRIC: The patient is alert and oriented  SKIN: No obvious rash, lesion, or ulcer.   LABORATORY PANEL:   CBC  Recent Labs Lab 12/23/14 1055 12/25/14 0735 12/26/14 0706 12/27/14 0729 12/27/14 0900 12/28/14 1305 12/29/14 0616  WBC 6.0 20.4* 11.1* 9.2 8.9 8.1 9.5  HGB 10.3* 9.2* 7.9* 8.1* 7.7* 8.1* 8.1*  HCT 32.2* 29.5* 24.5* 24.8* 23.3* 24.9* 25.3*  PLT 252 150 110* 117* 111* 127* 153  MCV 87.3 87.7 86.0 85.9 84.9 85.1 84.9  MCH 27.8 27.4 27.8 28.2 28.1 27.6 27.3  MCHC 31.9* 31.3* 32.3 32.8 33.1 32.4 32.2  RDW 15.6* 15.8* 16.2* 16.4* 16.6* 16.4* 16.9*  LYMPHSABS 0.8* 0.5*  --   --   --   --   --   MONOABS 0.3 1.2*  --   --   --   --   --   EOSABS 0.0 0.0  --   --   --   --   --   BASOSABS 0.1 0.0  --   --   --   --   --    ------------------------------------------------------------------------------------------------------------------  Chemistries   Recent Labs Lab 12/26/14 0707 12/27/14 0729 12/27/14 0900 12/28/14 0437 12/28/14 1305 12/29/14 0616  NA 137 136 136 137 137 136  K 3.9 4.3 4.2 4.3 4.3 4.1  CL 96* 94* 95* 95* 94* 93*  CO2 30 28 26 30 30 29   GLUCOSE 121* 147* 163* 160* 195* 141*  BUN 52* 77* 81* 54* 66* 78*  CREATININE 10.71* 12.65* 13.19* 8.86* 9.61* 10.74*  CALCIUM 8.2* 8.2* 8.0* 8.2* 8.2* 8.3*  MG  --  2.5*  --   --   --  2.7*  AST  75* 50*  --  41 34 25  ALT 62 61  --  56 49 43  ALKPHOS 75 64  --  62 61 60  BILITOT 6.6* 5.4*  --  4.3* 3.8* 3.1*   ------------------------------------------------------------------------------------------------------------------ estimated creatinine clearance is 7.7 mL/min (by C-G formula based on Cr of 10.74). ------------------------------------------------------------------------------------------------------------------  Recent Labs  12/28/14 0437  TSH 0.162*     Coagulation profile No results for input(s): INR, PROTIME in the last 168 hours. ------------------------------------------------------------------------------------------------------------------- No results for input(s): DDIMER in the last 72 hours. -------------------------------------------------------------------------------------------------------------------  Cardiac Enzymes No results for input(s): CKMB, TROPONINI, MYOGLOBIN in the last 168 hours.  Invalid input(s): CK ------------------------------------------------------------------------------------------------------------------ Invalid input(s): POCBNP  ---------------------------------------------------------------------------------------------------------------  Urinalysis No results found for: COLORURINE, APPEARANCEUR, LABSPEC, PHURINE, GLUCOSEU, HGBUR, BILIRUBINUR, KETONESUR, PROTEINUR, UROBILINOGEN, NITRITE, LEUKOCYTESUR   RADIOLOGY: Nm Hepatobiliary Liver Func  12/27/2014   CLINICAL DATA:  Elevated bilirubin.  EXAM: NUCLEAR MEDICINE HEPATOBILIARY IMAGING  TECHNIQUE: Sequential images of the abdomen were obtained out to 60 minutes following intravenous administration of radiopharmaceutical.  RADIOPHARMACEUTICALS:  7.18 mCi Tc-110m Choletec IV  COMPARISON:  Ultrasound of Dec 25, 2014.  FINDINGS: Uptake within hepatic parenchyma is noted. Filling of small bowel is noted. However, no uptake of filling of gallbladder is seen 90 minutes after radiotracer  administration. This is concerning for cystic duct obstruction and possible cholecystitis.  IMPRESSION: No uptake within gallbladder is seen up to 90 minutes after radiotracer administration, concerning for cystic duct obstruction and possible cholecystitis.   Electronically Signed   By: Marijo Conception, M.D.   On: 12/27/2014 16:24    EKG: Orders placed or performed during the hospital encounter of 12/25/14  . EKG  . EKG 12-Lead  . EKG 12-Lead  . EKG 12-Lead  . EKG 12-Lead  . EKG 12-Lead  . EKG 12-Lead  . EKG 12-Lead  . EKG 12-Lead    IMPRESSION AND PLAN:  Sepsis - Secondary to aspiration pneumonia,   And likely change to by mouth Augmentin in the a.m, if doing well - Aspiration pneumonia/HCAP - Pulmonary toilet, flutter valve, nebs, Mucinex Now improved off oxygen    End-stage renal disease Nephrology consulted to resume his hemodialysis- currently receiving - Continue with Renagel and renal diet.  Atrial fibrillation New onset,  echo showing systolic dysfunction., tsh low ft4 elevated possible hyperthyrodism, appreciate underwent port  Elevated Bilirubin - Ultrasound RUQ which shows some gallbladder sludge but the common bile duct size is normal.  hida abnormal I have d/w dr. Copper who saw the patient. And stated that there was no indication for acute cholecystectomy patient Ward to have further GI symptoms recommended a drain be placed.  History of CVA - Patient with some altered mental status at this time O Goehring did a CT scan of the head to check ABG and ammonia level.   Diabetes mellitus - Appears to be controlled with diet, not taking any  home medication, will monitor CBGs, if needed will start on insulin sliding scale  Anemia- no evidence of hemolysis  TOTAL TIME TAKING CARE OF THIS PATIENT: 51min  Scharlene Catalina M.D on 12/29/2014 at 3:32 PM  Between 7am to 6pm - Pager - [phone]  After 6pm go to [email or web address] - password [email or web address] [facility]  Office  [phone]  CC: Primary care physician; Miguel Aschoff, MD

## 2014-12-29 NOTE — Progress Notes (Signed)
ANTIBIOTIC CONSULT NOTE - INITIAL  Pharmacy Consult for vancomycin and zosyn Indication: pneumonia and rule out sepsis  No Known Allergies  Patient Measurements: Height: 5\' 8"  (172.7 cm) Weight: 188 lb 12.8 oz (85.639 kg) IBW/kg (Calculated) : 68.4   Vital Signs: Temp: 98.3 F (36.8 C) (05/11 1400) Temp Source: Oral (05/11 1400) BP: 126/68 mmHg (05/11 1406) Pulse Rate: 79 (05/11 1408) Intake/Output from previous day: 05/10 0701 - 05/11 0700 In: 210 [P.O.:210] Out: 0  Intake/Output from this shift: Total I/O In: 195 [P.O.:120; IV Piggyback:75] Out: 1500 [Other:1500]  Labs:  Recent Labs  12/27/14 0900 12/28/14 0437 12/28/14 1305 12/29/14 0616  WBC 8.9  --  8.1 9.5  HGB 7.7*  --  8.1* 8.1*  PLT 111*  --  127* 153  CREATININE 13.19* 8.86* 9.61* 10.74*   Estimated Creatinine Clearance: 7.7 mL/min (by C-G formula based on Cr of 10.74).  Recent Labs  12/29/14 1212  [place] 18     Microbiology: Recent Results (from the past 720 hour(s))  Blood Culture (routine x 2)     Status: None (Preliminary result)   Collection Time: 12/25/14  7:35 AM  Result Value Ref Range Status   Specimen Description BLOOD  Final   Special Requests NONE  Final   Culture   Final    GRAM NEGATIVE RODS IN BOTH AEROBIC AND ANAEROBIC BOTTLES CRITICAL RESULT CALLED TO, READ BACK BY AND VERIFIED WITH: ZACHARY ALLEN AT 2332 12/25/14 [facility] GRAM POSITIVE COCCI AEROBIC BOTTLE ONLY    Report Status PENDING  Incomplete  Blood Culture (routine x 2)     Status: None (Preliminary result)   Collection Time: 12/25/14  7:35 AM  Result Value Ref Range Status   Specimen Description BLOOD  Final   Special Requests NONE  Final   Culture   Final    GRAM NEGATIVE RODS IN BOTH AEROBIC AND ANAEROBIC BOTTLES CRITICAL VALUE NOTED.  VALUE IS CONSISTENT WITH PREVIOUSLY REPORTED AND CALLED VALUE. GRAM POSITIVE COCCI AEROBIC BOTTLE ONLY CRITICAL VALUE NOTED.  VALUE IS CONSISTENT WITH PREVIOUSLY REPORTED AND  CALLED VALUE. IDENTIFICATION TO FOLLOW SUSCEPTIBILITIES TO FOLLOW    Report Status PENDING  Incomplete  Influenza A&B Antigens [facility])     Status: None   Collection Time: 12/25/14 12:47 PM  Result Value Ref Range Status   Influenza A [facility]) NEGATIVE  Final   Influenza B [facility]) NEGATIVE  Final    Medical History: Past Medical History  Diagnosis Date  . ESRD (end stage renal disease)     a. on HD M,W,F; b. previously on transplant list at [facility] - removed 2016  . DM2 (diabetes mellitus, type 2)   . CAD (coronary artery disease)     a. cardiac cath 2014: LM nl, mLAD 30%, dLAD 30%, ostLCx 50%, mid ramus 20% OM3 80% pRCA 20%, mRCA 20%, PDA 100% w/ left to right collats, medically managed   . Stroke     Medications:  See med rec  Assessment: Patient's a 64 y.o M with ESRD on HD MWF who presented to the ED with c/o weakness, cough, SOB, and AMS. CXR with suspicion for PNA.  Patient received 1gm of vancomycin at 0900 and zosyn 3.375 gm IV x1 at 0855 in the ED today.  To continue vancomycin and zosyn for PNA and r/o sepsis.  Goal of Therapy:  Pre-HD vancomycin level: 15-25 5/11  Vanc trough= 18.  Will continue with current dose. Plan:  Patient received vancomycin total of 2 gm loading  dose on 5/7.  Ordered Vanc 1gm IV after each HD session - zosyn 3.375 gm IV q12h (over 4 hours) - f/u with cultures, clinical status, and HD schedule  Noralee Space, RPh Clinical Pharmacist 12/29/2014,3:57 PM

## 2014-12-29 NOTE — Progress Notes (Signed)
Hd end 

## 2014-12-30 LAB — GLUCOSE, CAPILLARY
GLUCOSE-CAPILLARY: 150 mg/dL — AB (ref 65–99)
GLUCOSE-CAPILLARY: 134 mg/dL — AB (ref 65–99)
GLUCOSE-CAPILLARY: 148 mg/dL — AB (ref 65–99)
Glucose-Capillary: 122 mg/dL — ABNORMAL HIGH (ref 65–99)

## 2014-12-30 LAB — CBC
HEMATOCRIT: 24.6 % — AB (ref 40.0–52.0)
HEMOGLOBIN: 7.9 g/dL — AB (ref 13.0–18.0)
MCH: 27.3 pg (ref 26.0–34.0)
MCHC: 32.1 g/dL (ref 32.0–36.0)
MCV: 85.2 fL (ref 80.0–100.0)
Platelets: 197 10*3/uL (ref 150–440)
RBC: 2.89 MIL/uL — ABNORMAL LOW (ref 4.40–5.90)
RDW: 16.8 % — ABNORMAL HIGH (ref 11.5–14.5)
WBC: 10.3 10*3/uL (ref 3.8–10.6)

## 2014-12-30 LAB — MISC LABCORP TEST (SEND OUT): Labcorp test code: 10314

## 2014-12-30 LAB — COMPREHENSIVE METABOLIC PANEL
ALBUMIN: 1.9 g/dL — AB (ref 3.5–5.0)
ALT: 31 U/L (ref 17–63)
ANION GAP: 12 (ref 5–15)
AST: 25 U/L (ref 15–41)
Alkaline Phosphatase: 61 U/L (ref 38–126)
BUN: 44 mg/dL — AB (ref 6–20)
CALCIUM: 7.8 mg/dL — AB (ref 8.9–10.3)
CO2: 26 mmol/L (ref 22–32)
CREATININE: 7.11 mg/dL — AB (ref 0.61–1.24)
Chloride: 94 mmol/L — ABNORMAL LOW (ref 101–111)
GFR calc Af Amer: 9 mL/min — ABNORMAL LOW (ref >60)
GFR calc non Af Amer: 7 mL/min — ABNORMAL LOW (ref >60)
Glucose, Bld: 135 mg/dL — ABNORMAL HIGH (ref 65–99)
Potassium: 5 mmol/L (ref 3.5–5.1)
Sodium: 132 mmol/L — ABNORMAL LOW (ref 135–145)
TOTAL PROTEIN: 5.8 g/dL — AB (ref 6.5–8.1)
Total Bilirubin: 3 mg/dL — ABNORMAL HIGH (ref 0.3–1.2)

## 2014-12-30 LAB — T3: T3 TOTAL: 53 ng/dL — AB (ref 71–180)

## 2014-12-30 MED ORDER — EPOETIN ALFA 20000 UNIT/ML IJ SOLN
20000.0000 [IU] | INTRAMUSCULAR | Status: DC
  Administered 2014-12-31: 20000 [IU] via SUBCUTANEOUS
  Filled 2014-12-30 (×2): qty 1

## 2014-12-30 NOTE — Progress Notes (Addendum)
Patient: Luis Walter / Admit Date: 12/25/2014 / Date of Encounter: 12/30/2014, 12:07 PM   Subjective: More alert today. Up sitting in recliner. No SOB, chest pain, or palpitations. Tolerating Lopressor 50 mg q 6 hours. Head CT 5/11 without any acute intracranial abnormalities, unchanged from prior imaging.    Review of Systems: Review of Systems  Constitutional: Positive for weight loss and malaise/fatigue. Negative for fever, chills and diaphoresis.  Respiratory: Positive for cough and shortness of breath. Negative for hemoptysis, sputum production and wheezing.   Cardiovascular: Negative for chest pain, palpitations, orthopnea, claudication, leg swelling and PND.  Gastrointestinal: Negative for nausea, vomiting and abdominal pain.  Musculoskeletal: Positive for myalgias. Negative for falls.  Neurological: Positive for focal weakness and weakness. Negative for loss of consciousness.  Psychiatric/Behavioral: The patient is not nervous/anxious.   All other systems reviewed and are negative.   Objective: Telemetry: NSR, 60s, frequent ectopy including PACs, PVCs, junctional escape beats, 11 beats of NSVT at midnight  Physical Exam: Blood pressure 121/70, pulse 62, temperature 98 F (36.7 C), temperature source Oral, resp. rate 18, height 5\' 8"  (1.727 m), weight 188 lb 11.2 oz (85.594 kg), SpO2 100 %. Body mass index is 28.7 kg/(m^2). General: Well developed, well nourished, in no acute distress. Head: Normocephalic, atraumatic, sclera non-icteric, no xanthomas, nares are without discharge. Neck: Negative for carotid bruits. JVP not elevated. Lungs: Clear bilaterally to auscultation without wheezes, rales, or rhonchi. Breathing is unlabored. Heart: Irregular, S1 S2 without murmurs, rubs, or gallops.  Abdomen: Soft, non-tender, non-distended with normoactive bowel sounds. No rebound/guarding. Extremities: No clubbing or cyanosis. No edema. Distal pedal pulses are 2+ and equal  bilaterally. Neuro: Alert and oriented X 3. Moves all extremities spontaneously. Psych:  Responds to questions appropriately with a normal affect.   Intake/Output Summary (Last 24 hours) at 12/30/14 1207 Last data filed at 12/30/14 0745  Gross per 24 hour  Intake    240 ml  Output   1500 ml  Net  -1260 ml    Inpatient Medications:  . [START ON 12/31/2014] epoetin (EPOGEN/PROCRIT) injection  20,000 Units Subcutaneous Weekly  . heparin  5,000 Units Subcutaneous 3 times per day  . metoprolol tartrate  50 mg Oral Q6H  . piperacillin-tazobactam (ZOSYN)  IV  3.375 g Intravenous Q12H  . pneumococcal 23 valent vaccine  0.5 mL Intramuscular Tomorrow-1000  . sevelamer carbonate  3,200 mg Oral TID WC   Infusions:    Labs:  Recent Labs  12/29/14 0616 12/30/14 0435  NA 136 132*  K 4.1 5.0  CL 93* 94*  CO2 29 26  GLUCOSE 141* 135*  BUN 78* 44*  CREATININE 10.74* 7.11*  CALCIUM 8.3* 7.8*  MG 2.7*  --     Recent Labs  12/29/14 0616 12/30/14 0435  AST 25 25  ALT 43 31  ALKPHOS 60 61  BILITOT 3.1* 3.0*  PROT 6.4* 5.8*  ALBUMIN 2.2* 1.9*    Recent Labs  12/29/14 0616 12/30/14 0435  WBC 9.5 10.3  HGB 8.1* 7.9*  HCT 25.3* 24.6*  MCV 84.9 85.2  PLT 153 197   No results for input(s): CKTOTAL, CKMB, TROPONINI in the last 72 hours. Invalid input(s): POCBNP No results for input(s): HGBA1C in the last 72 hours.   Weights: Filed Weights   12/28/14 0411 12/29/14 0442 12/30/14 0407  Weight: 185 lb 4.8 oz (84.052 kg) 188 lb 12.8 oz (85.639 kg) 188 lb 11.2 oz (85.594 kg)     Radiology/Studies:  Ct Head Wo Contrast  12/29/2014   CLINICAL DATA:  There is concern that patient is very sleepy and lethargic on. Normally he is very functional and he works as well.  EXAM: CT HEAD WITHOUT CONTRAST  TECHNIQUE: Contiguous axial images were obtained from the base of the skull through the vertex without intravenous contrast.  COMPARISON:  05/30/2013  FINDINGS: The ventricles are normal  in configuration. There is ventricular enlargement, greater than sulcal enlargement, stable from the prior studies, consistent with mild to moderate atrophy. No hydrocephalus.  There is a larger area of encephalomalacia throughout much of the right middle cerebral artery territory reflecting an old infarct. There is a smaller area of posterior left frontal and adjacent left parietal lobe encephalomalacia reflecting a second area of chronic infarction. There is some encephalomalacia in the posterior medial right occipital parietal lobe reflecting an area of old right PCA distribution infarction. There are no parenchymal masses or mass effect. There is no evidence of a recent transcortical infarct.  There are no extra-axial masses or abnormal fluid collections.  There is no intracranial hemorrhage.  Visualized sinuses and mastoid air cells are clear.  IMPRESSION: 1. No acute intracranial abnormalities. 2. Atrophy and multiple old infarcts, unchanged from the prior imaging.   Electronically Signed   By: Lajean Manes M.D.   On: 12/29/2014 16:10   Nm Hepatobiliary Liver Func  12/27/2014   CLINICAL DATA:  Elevated bilirubin.  EXAM: NUCLEAR MEDICINE HEPATOBILIARY IMAGING  TECHNIQUE: Sequential images of the abdomen were obtained out to 60 minutes following intravenous administration of radiopharmaceutical.  RADIOPHARMACEUTICALS:  7.18 mCi Tc-56m Choletec IV  COMPARISON:  Ultrasound of Dec 25, 2014.  FINDINGS: Uptake within hepatic parenchyma is noted. Filling of small bowel is noted. However, no uptake of filling of gallbladder is seen 90 minutes after radiotracer administration. This is concerning for cystic duct obstruction and possible cholecystitis.  IMPRESSION: No uptake within gallbladder is seen up to 90 minutes after radiotracer administration, concerning for cystic duct obstruction and possible cholecystitis.   Electronically Signed   By: Marijo Conception, M.D.   On: 12/27/2014 16:24   Dg Chest Port 1  View  12/25/2014   CLINICAL DATA:  Two normal 80s. Fever. Nausea vomiting for 1 week. Code sepsis.  EXAM: PORTABLE CHEST - 1 VIEW  COMPARISON:  05/30/2013  FINDINGS: Lung volumes are relatively low. There is hazy opacity at the right lung base which is likely atelectasis. Pneumonia is possible. Lungs otherwise clear. No pleural effusion or pneumothorax.  Cardiac silhouette normal in size and configuration. No mediastinal or hilar masses.  Bony thorax is demineralized but grossly intact.  IMPRESSION: 1. Area of hazy opacity at the right lung base, most likely atelectasis. Pneumonia is possible. 2. No other evidence of acute cardiopulmonary disease.   Electronically Signed   By: Lajean Manes M.D.   On: 12/25/2014 08:31   US Abdomen Limited Ruq  12/25/2014   CLINICAL DATA:  Elevated bilirubin.  Cholecystitis.  EXAM: US ABDOMEN LIMITED - RIGHT UPPER QUADRANT  COMPARISON:  None.  FINDINGS: Gallbladder:  Cholelithiasis and biliary sludge is present. The largest stone measures 7 mm. Some of the stones appear non mobile despite repositioning. The gallbladder wall is striated and measures 6 mm which is thickened. No sonographic Percell Miller sign is present.  Common bile duct:  Diameter: 6 mm, within normal limits for age.  Liver:  No focal lesion identified. Within normal limits in parenchymal echogenicity.  RIGHT pleural effusion incidentally noted.  IMPRESSION: Biliary sludge in stones with nonspecific gallbladder wall thickening. In the absence of a sonographic Murphy sign, the significance is unclear. The sonographic findings are compatible with cholecystitis which may be chronic. In a patient with elevated bilirubin, consider HIDA scan for further assessment.   Electronically Signed   By: Dereck Ligas M.D.   On: 12/25/2014 15:37     Assessment and Plan  1. Ectopy: -Patient had a episode of tachycardia with heart rate into the 120s while in dialysis on 5/9. Unfortunately, there were no strips or EKG for review and  he was not hooked up to telemetry at that time for review  -Telemetry continues to show NSR with frequent PACs, PVCs, junctional escape beats, blocked PACs, had 11 beats of NSVT over night, asymptomatic  -Continue Lopressor 50 mg q 6 hours at this time, tolerating from BP and pulse standpoint  -Will hold adding CCB at this time given EF 35-40% -Continue to monitor -Consider 30 day event monitor as an outpatient (though given his lack of arrhythmia, could consider outpatient clinic follow-up first) -The stress of his illness certainly does place him at increased risk of developing new onset arrhythmia -No documented indication for long term anticoagulation at this time, continue to monitor  -EKG from 5/10 with NSR with frequent premature supraventricular complexes  -Possible transient thyroiditis in the setting of infection, endocrine recommends repeat studies in 2-3 days  -Magnesium ok -K+ ok  2. CAD: -Last cardiac cath in 2014 as above, medically managed  -Nuclear stress test 01/2014 with severe HK of inferior wall and mild HK of lateral wall  -No angina  -Echo showed EF 35-40%, moderate diffuse HK, RWMA cannot be excluded, mild MR, moderately dilated LA, RV mildly dilated, mild TR, PASP normal -Patient has known severe HK of inferior wall and mild lateral HK dating back to 01/2014 nuclear stress test. EF at that time back nuclear stress test was 49%  3. Sepsis/GNR bacteremia: -Secondary to aspiration PNA -On IV vancomycin and Zosyn per IM -Patient reports feeling better  4. Elevated LFTs/bilirubin: -Improving -GI on board, possible scope once stable -Trend  5. New onset anemia: -Maintain hgb 8-8.5 -GI planning eval in the future   6. ESRD on HD: -Per Renal  7. History of stroke: -Continue aspirin  8. DM2: -Per IM  Signed, Christell Faith, PA-C 12/30/2014 12:14 PM   Attending note Patient seen and examined, agree with noted above Discussed on rounds this morning with  Christell Faith Tolerating metoprolol 50 mg twice a day with improved heart rate Still with ectopy on monitor though no significant arrhythmia Given his lack of arrhythmia over his hospital course, will likely not need a 30 day monitor. Suspect tachycardia on day of admission was likely secondary to underlying bacteremia/sepsis, thyroid disease We will have him follow up in clinic as an outpatient for further monitoring  Esmond Plants, M.D.

## 2014-12-30 NOTE — Care Management Note (Signed)
Patient is active at [facility] MWF

## 2014-12-30 NOTE — Progress Notes (Signed)
Physical Therapy Treatment Patient Details [name] MRN: [id number] DOB: June 25, 1951 Today's Date: 12/30/2014    History of Present Illness Aspiration PNA and ESRD presenting to hosp with elevated creatinine and occluded cystic duct, non surgical patient.  New a-fib and controlled rate.    PT Comments    Pt alert today and motivated to participate in therapy.  Ambulated approx 180' x1 with RW cues for pacing and safety with environment.  Decreased assistance required today for all activities.  Pt continues to demonstrate overall decreased safety awareness and imp[ulsiveness.  Family wishes to d/c to [place] wife would be at home with pt.    Follow Up Recommendations  Home health PT;Supervision/Assistance - 24 hour     Equipment Recommendations  Rolling walker with 5" wheels    Recommendations for Other Services       Precautions / Restrictions Precautions Precautions: Fall Precaution Comments: No BP L UE Restrictions Weight Bearing Restrictions: No    Mobility  Bed Mobility                  Transfers Overall transfer level: Modified independent Equipment used: Rolling walker (2 wheeled) Transfers: Sit to/from Stand Sit to Stand: Min guard         General transfer comment: reminders needed for sequence and safety  Ambulation/Gait Ambulation/Gait assistance: Min guard Ambulation Distance (Feet): 180 Feet Assistive device: Rolling walker (2 wheeled);2 person hand held assist Gait Pattern/deviations: Scissoring     General Gait Details: wide BOS, shuffling gait, sway L&R,    Stairs            Wheelchair Mobility    Modified Rankin (Stroke Patients Only)       Balance Overall balance assessment: Needs assistance Sitting-balance support: Feet supported Sitting balance-Leahy Scale: Good     Standing balance support: Bilateral upper extremity supported Standing balance-Leahy Scale: Fair                      Cognition  Arousal/Alertness: Awake/alert Behavior During Therapy: Agitated;Anxious Overall Cognitive Status: Impaired/Different from baseline Area of Impairment: Attention;Safety/judgement;Awareness       Following Commands: Follows one step commands inconsistently Safety/Judgement: Decreased awareness of safety;Decreased awareness of deficits Awareness: Intellectual Problem Solving: Slow processing;Difficulty sequencing;Requires verbal cues      Exercises General Exercises - Lower Extremity Hip ABduction/ADduction: Both;Strengthening;15 reps;Standing Straight Leg Raises: 15 reps;Strengthening;Both;Standing Hip Flexion/Marching: Strengthening;Both;15 reps;Standing Mini-Sqauts: Strengthening;Both;20 reps;Standing    General Comments General comments (skin integrity, edema, etc.): Pt more alert aware today, wife states change in meds and although still below baseline has improved      Pertinent Vitals/Pain Pain Assessment: No/denies pain    Home Living                      Prior Function            PT Goals (current goals can now be found in the care plan section) Acute Rehab PT Goals PT Goal Formulation: With patient Time For Goal Achievement: 01/09/15 Potential to Achieve Goals: Good Progress towards PT goals: Progressing toward goals    Frequency  Min 2X/week    PT Plan Discharge plan needs to be updated    Co-evaluation             End of Session Equipment Utilized During Treatment: Gait belt;Oxygen Activity Tolerance: Patient tolerated treatment well Patient left: in bed;with call bell/phone within reach;with bed alarm set;with nursing/sitter in room  Time: 1959-7471 PT Time Calculation (min) (ACUTE ONLY): 43 min  Charges:  $Gait Training: 8-22 mins $Therapeutic Exercise: 8-22 mins $Therapeutic Activity: 8-22 mins                    G Codes:      Jerelle Virden 01-25-2015, 2:29 PM  Payson Evrard, PTA

## 2014-12-30 NOTE — Progress Notes (Signed)
[place] [facility] [place] [name]    DATE OF ADMISSION:  12/25/2014  S:  No complaint except weakness.  REVIEW OF SYSTEMS:   CONSTITUTIONAL: Patient afebrile, complains of fatigue and weakness EYES: No blurred or double vision.  EARS, NOSE, AND THROAT: No tinnitus or ear pain.  RESPIRATORY: Reports cough, shortness of breath, productive sputum CARDIOVASCULAR: No chest pain, orthopnea, edema.  GASTROINTESTINAL: no nausea, vomiting or diarrhea, no bdominal pain. GENITOURINARY: No dysuria, hematuria.  ENDOCRINE: No polyuria, nocturia,  HEMATOLOGY: No anemia, easy bruising or bleeding SKIN: No rash or lesion. MUSCULOSKELETAL: No joint pain or arthritis.   NEUROLOGIC: No tingling, numbness, weakness PSYCHIATRY: No anxiety or depression.   MEDICATIONS AT HOME:  Prior to Admission medications   Medication Sig Start Date End Date Taking? Authorizing Provider  aspirin 325 MG tablet Take 325 mg by mouth daily.   Yes Historical Provider, MD  sevelamer (RENAGEL) 800 MG tablet Take 3,200 mg by mouth 3 (three) times daily with meals.   Yes Historical Provider, MD  docusate sodium (COLACE) 100 MG capsule Take 1 capsule (100 mg total) by mouth 2 (two) times daily. 12/23/14 12/23/15  Lavonia Drafts, MD      PHYSICAL EXAMINATION:   VITAL SIGNS: Blood pressure 121/70, pulse 62, temperature 98 F (36.7 C), temperature source Oral, resp. rate 18, height 5\' 8"  (1.727 m), weight 85.594 kg (188 lb 11.2 oz), SpO2 100 %.  GENERAL:  64 y.o.-year-old patient lying in the bed , frail, ill-appearing EYES: Pupils equal, round, reactive to light and accommodation. No scleral icterus. Extraocular muscles intact.  HEENT: Head atraumatic, normocephalic. Oropharynx and nasopharynx clear.  NECK:  Supple, no jugular venous distention. No thyroid enlargement, no tenderness.  LUNGS: Normal breath sounds bilaterally, no wheezing, bibasilar Rales. No use of accessory  muscles of respiration.  CARDIOVASCULAR: S1, S2 normal. No murmurs, rubs, or gallops.  ABDOMEN: Soft, nontender, nondistended. Bowel sounds present. No organomegaly or mass.  EXTREMITIES: No pedal edema, cyanosis, or clubbing.  NEUROLOGIC: Cranial nerves II through XII are intact. Muscle strength 4/5 in all extremities. Sensation intact. Patient's little lethargic but able answer questions PSYCHIATRIC: The patient is alert and oriented  SKIN: No obvious rash, lesion, or ulcer.   LABORATORY PANEL:   CBC  Recent Labs Lab 12/25/14 0735  12/27/14 0729 12/27/14 0900 12/28/14 1305 12/29/14 0616 12/30/14 0435  WBC 20.4*  < > 9.2 8.9 8.1 9.5 10.3  HGB 9.2*  < > 8.1* 7.7* 8.1* 8.1* 7.9*  HCT 29.5*  < > 24.8* 23.3* 24.9* 25.3* 24.6*  PLT 150  < > 117* 111* 127* 153 197  MCV 87.7  < > 85.9 84.9 85.1 84.9 85.2  MCH 27.4  < > 28.2 28.1 27.6 27.3 27.3  MCHC 31.3*  < > 32.8 33.1 32.4 32.2 32.1  RDW 15.8*  < > 16.4* 16.6* 16.4* 16.9* 16.8*  LYMPHSABS 0.5*  --   --   --   --   --   --   MONOABS 1.2*  --   --   --   --   --   --   EOSABS 0.0  --   --   --   --   --   --   BASOSABS 0.0  --   --   --   --   --   --   < > = values in this interval not displayed. ------------------------------------------------------------------------------------------------------------------  Chemistries  Recent Labs Lab 12/27/14 0729 12/27/14 0900 12/28/14 0437 12/28/14 1305 12/29/14 0616 12/30/14 0435  NA 136 136 137 137 136 132*  K 4.3 4.2 4.3 4.3 4.1 5.0  CL 94* 95* 95* 94* 93* 94*  CO2 28 26 30 30 29 26   GLUCOSE 147* 163* 160* 195* 141* 135*  BUN 77* 81* 54* 66* 78* 44*  CREATININE 12.65* 13.19* 8.86* 9.61* 10.74* 7.11*  CALCIUM 8.2* 8.0* 8.2* 8.2* 8.3* 7.8*  MG 2.5*  --   --   --  2.7*  --   AST 50*  --  41 34 25 25  ALT 61  --  56 49 43 31  ALKPHOS 64  --  62 61 60 61  BILITOT 5.4*  --  4.3* 3.8* 3.1* 3.0*    ------------------------------------------------------------------------------------------------------------------ estimated creatinine clearance is 11.6 mL/min (by C-G formula based on Cr of 7.11). ------------------------------------------------------------------------------------------------------------------  Recent Labs  12/28/14 0437  TSH 0.162*     Coagulation profile No results for input(s): INR, PROTIME in the last 168 hours. ------------------------------------------------------------------------------------------------------------------- No results for input(s): DDIMER in the last 72 hours. -------------------------------------------------------------------------------------------------------------------  Cardiac Enzymes No results for input(s): CKMB, TROPONINI, MYOGLOBIN in the last 168 hours.  Invalid input(s): CK ------------------------------------------------------------------------------------------------------------------ Invalid input(s): POCBNP  ---------------------------------------------------------------------------------------------------------------  Urinalysis No results found for: COLORURINE, APPEARANCEUR, LABSPEC, PHURINE, GLUCOSEU, HGBUR, BILIRUBINUR, KETONESUR, PROTEINUR, UROBILINOGEN, NITRITE, LEUKOCYTESUR   RADIOLOGY: Ct Head Wo Contrast  12/29/2014   CLINICAL DATA:  There is concern that patient is very sleepy and lethargic on. Normally he is very functional and he works as well.  EXAM: CT HEAD WITHOUT CONTRAST  TECHNIQUE: Contiguous axial images were obtained from the base of the skull through the vertex without intravenous contrast.  COMPARISON:  05/30/2013  FINDINGS: The ventricles are normal in configuration. There is ventricular enlargement, greater than sulcal enlargement, stable from the prior studies, consistent with mild to moderate atrophy. No hydrocephalus.  There is a larger area of encephalomalacia throughout much of the right middle  cerebral artery territory reflecting an old infarct. There is a smaller area of posterior left frontal and adjacent left parietal lobe encephalomalacia reflecting a second area of chronic infarction. There is some encephalomalacia in the posterior medial right occipital parietal lobe reflecting an area of old right PCA distribution infarction. There are no parenchymal masses or mass effect. There is no evidence of a recent transcortical infarct.  There are no extra-axial masses or abnormal fluid collections.  There is no intracranial hemorrhage.  Visualized sinuses and mastoid air cells are clear.  IMPRESSION: 1. No acute intracranial abnormalities. 2. Atrophy and multiple old infarcts, unchanged from the prior imaging.   Electronically Signed   By: Lajean Manes M.D.   On: 12/29/2014 16:10    EKG: Orders placed or performed during the hospital encounter of 12/25/14  . EKG  . EKG 12-Lead  . EKG 12-Lead  . EKG 12-Lead  . EKG 12-Lead  . EKG 12-Lead  . EKG 12-Lead  . EKG 12-Lead  . EKG 12-Lead    IMPRESSION AND PLAN:  Sepsis with  aspiration pneumonia and bacteremia. B/C: GNR, d/c vancomycin, cont zosyn, ID consult. F/u B/C. - Aspiration pneumonia/HCAP - Pulmonary toilet, flutter valve, nebs, Mucinex, cont zosyn. Now improved off oxygen    End-stage renal disease Nephrology consulted to resume his hemodialysis- currently receiving - Continue with Renagel and renal diet.  Atrial fibrillation New onset,  echo showing systolic dysfunction., tsh low ft4 elevated possible hyperthyrodism, appreciate underwent port  Elevated  Bilirubin - Ultrasound RUQ which shows some gallbladder sludge but the common bile duct size is normal.  hida abnormal. Per Dr. Copper, no indication for acute cholecystectomy patient Ward to have further GI symptoms recommended a drain be placed.  History of CVA Head CT scan neg, normal ammonia level.   Diabetes mellitus on insulin sliding scale  Anemia of  chronic disease.  Stable. D/w pt's mother and Dr. Candiss Norse.  TOTAL TIME TAKING CARE OF THIS PATIENT: 49 min  Demetrios Loll M.D on 12/30/2014 at 1:41 PM  Between 7am to 6pm - Pager - [phone]  After 6pm go to [email or web address] - password [email or web address] [facility]  Office  [phone]  CC: Primary care physician; Miguel Aschoff, MD

## 2014-12-31 ENCOUNTER — Encounter: Payer: Self-pay | Admitting: Physician Assistant

## 2014-12-31 DIAGNOSIS — I5022 Chronic systolic (congestive) heart failure: Secondary | ICD-10-CM | POA: Diagnosis present

## 2014-12-31 DIAGNOSIS — I2510 Atherosclerotic heart disease of native coronary artery without angina pectoris: Secondary | ICD-10-CM | POA: Diagnosis present

## 2014-12-31 LAB — RENAL FUNCTION PANEL
Albumin: 1.9 g/dL — ABNORMAL LOW (ref 3.5–5.0)
Anion gap: 12 (ref 5–15)
BUN: 57 mg/dL — ABNORMAL HIGH (ref 6–20)
CALCIUM: 7.9 mg/dL — AB (ref 8.9–10.3)
CHLORIDE: 93 mmol/L — AB (ref 101–111)
CO2: 30 mmol/L (ref 22–32)
Creatinine, Ser: 9.02 mg/dL — ABNORMAL HIGH (ref 0.61–1.24)
GFR calc non Af Amer: 6 mL/min — ABNORMAL LOW (ref >60)
GFR, EST AFRICAN AMERICAN: 6 mL/min — AB (ref >60)
Glucose, Bld: 138 mg/dL — ABNORMAL HIGH (ref 65–99)
Phosphorus: 3.7 mg/dL (ref 2.5–4.6)
Potassium: 3.9 mmol/L (ref 3.5–5.1)
SODIUM: 135 mmol/L (ref 135–145)

## 2014-12-31 LAB — CBC
HCT: 22.3 % — ABNORMAL LOW (ref 40.0–52.0)
Hemoglobin: 7.4 g/dL — ABNORMAL LOW (ref 13.0–18.0)
MCH: 28.1 pg (ref 26.0–34.0)
MCHC: 33.2 g/dL (ref 32.0–36.0)
MCV: 84.8 fL (ref 80.0–100.0)
PLATELETS: 264 10*3/uL (ref 150–440)
RBC: 2.63 MIL/uL — ABNORMAL LOW (ref 4.40–5.90)
RDW: 16.7 % — AB (ref 11.5–14.5)
WBC: 6.8 10*3/uL (ref 3.8–10.6)

## 2014-12-31 LAB — GLUCOSE, CAPILLARY
Glucose-Capillary: 121 mg/dL — ABNORMAL HIGH (ref 65–99)
Glucose-Capillary: 100 mg/dL — ABNORMAL HIGH (ref 65–99)
Glucose-Capillary: 147 mg/dL — ABNORMAL HIGH (ref 65–99)
Glucose-Capillary: 143 mg/dL — ABNORMAL HIGH (ref 65–99)

## 2014-12-31 MED ORDER — METOPROLOL TARTRATE 25 MG PO TABS
25.0000 mg | ORAL_TABLET | Freq: Two times a day (BID) | ORAL | Status: DC
  Administered 2015-01-01: 25 mg via ORAL
  Filled 2014-12-31 (×2): qty 1

## 2014-12-31 MED ORDER — CIPROFLOXACIN IN D5W 400 MG/200ML IV SOLN
400.0000 mg | INTRAVENOUS | Status: DC
Start: 2014-12-31 — End: 2015-01-01
  Administered 2014-12-31: 400 mg via INTRAVENOUS
  Filled 2014-12-31 (×2): qty 200

## 2014-12-31 NOTE — Progress Notes (Signed)
Physical Therapy Treatment Patient Details [name] MRN: [id number] DOB: [birth year] Today's Date: 12/31/2014    History of Present Illness Aspiration PNA and ESRD presenting to hosp with elevated creatinine and occluded cystic duct, non surgical patient.  New a-fib and controlled rate.    PT Comments    Pt reluctant at first then agreed to OOB activity.  Pt complaining of being cold throughout sesison.  Pt ambulates 200' with CGA and decreased balance but able to self-correct and maintain up right posture, more stagerring.  Pt compeleted seated and standing therex and is showing increased strength.    Follow Up Recommendations  Home health PT;Supervision/Assistance - 24 hour     Equipment Recommendations  Rolling walker with 5" wheels    Recommendations for Other Services       Precautions / Restrictions Precautions Precautions: Fall    Mobility  Bed Mobility Overal bed mobility: Modified Independent Bed Mobility: Supine to Sit;Sit to Supine     Supine to sit: Modified independent (Device/Increase time) Sit to supine: Modified independent (Device/Increase time)      Transfers Overall transfer level: Modified independent Equipment used: 1 person hand held assist Transfers: Sit to/from Stand Sit to Stand: Modified independent (Device/Increase time) Stand pivot transfers: Modified independent (Device/Increase time)          Ambulation/Gait Ambulation/Gait assistance: Min guard Ambulation Distance (Feet): 200 Feet Assistive device: None Gait Pattern/deviations: Step-through pattern Gait velocity: reduced   General Gait Details: unsteady steps but no overt LOB, uses wall as needed to steady self   Stairs            Wheelchair Mobility    Modified Rankin (Stroke Patients Only)       Balance Overall balance assessment: Needs assistance Sitting-balance support: No upper extremity supported;Feet supported       Standing balance  support: Single extremity supported                        Cognition Arousal/Alertness: Lethargic Behavior During Therapy: Agitated;Anxious Overall Cognitive Status: Within Functional Limits for tasks assessed         Following Commands: Follows one step commands with increased time Safety/Judgement: Decreased awareness of safety          Exercises General Exercises - Lower Extremity Hip ABduction/ADduction: Both;Strengthening;15 reps;Standing Straight Leg Raises: 15 reps;Strengthening;Both;Standing Hip Flexion/Marching: Strengthening;Both;15 reps;Standing Toe Raises: Strengthening;10 reps Heel Raises: Strengthening;10 reps    General Comments        Pertinent Vitals/Pain      Home Living                      Prior Function            PT Goals (current goals can now be found in the care plan section) Acute Rehab PT Goals PT Goal Formulation: With patient Time For Goal Achievement: 01/09/15 Potential to Achieve Goals: Good Progress towards PT goals: Progressing toward goals    Frequency  Min 2X/week    PT Plan Current plan remains appropriate    Co-evaluation             End of Session Equipment Utilized During Treatment: Gait belt Activity Tolerance: Patient limited by fatigue Patient left: in bed;with call bell/phone within reach;with bed alarm set;with nursing/sitter in room     Time: 7341-9379 PT Time Calculation (min) (ACUTE ONLY): 25 min  Charges:  $Gait Training: 8-22 mins $Therapeutic Exercise: 8-22 mins  G Codes:      Tameria Patti A Tinaya Ceballos 12/31/2014, 4:42 PM

## 2014-12-31 NOTE — Progress Notes (Signed)
Post hd note 

## 2014-12-31 NOTE — Progress Notes (Signed)
[facility] [place] [name]    DATE OF ADMISSION:  12/25/2014  S:  No complaint.  REVIEW OF SYSTEMS:   CONSTITUTIONAL: Patient afebrile, complains of fatigue and weakness EYES: No blurred or double vision.  EARS, NOSE, AND THROAT: No tinnitus or ear pain.  RESPIRATORY: Reports cough, shortness of breath, productive sputum CARDIOVASCULAR: No chest pain, orthopnea, edema.  GASTROINTESTINAL: no nausea, vomiting or diarrhea, no bdominal pain. GENITOURINARY: No dysuria, hematuria.  ENDOCRINE: No polyuria, nocturia,  HEMATOLOGY: No anemia, easy bruising or bleeding SKIN: No rash or lesion. MUSCULOSKELETAL: No joint pain or arthritis.   NEUROLOGIC: No tingling, numbness, no weakness PSYCHIATRY: No anxiety or depression.   MEDICATIONS AT HOME:  Prior to Admission medications   Medication Sig Start Date End Date Taking? Authorizing Provider  aspirin 325 MG tablet Take 325 mg by mouth daily.   Yes Historical Provider, MD  sevelamer (RENAGEL) 800 MG tablet Take 3,200 mg by mouth 3 (three) times daily with meals.   Yes Historical Provider, MD  docusate sodium (COLACE) 100 MG capsule Take 1 capsule (100 mg total) by mouth 2 (two) times daily. 12/23/14 12/23/15  Lavonia Drafts, MD      PHYSICAL EXAMINATION:   VITAL SIGNS: Blood pressure 109/56, pulse 63, temperature 97.6 F (36.4 C), temperature source Oral, resp. rate 16, height 5\' 8"  (1.727 m), weight 82.3 kg (181 lb 7 oz), SpO2 95 %.  GENERAL:  64 y.o.-year-old patient lying in the bed , frail, ill-appearing EYES: Pupils equal, round, reactive to light and accommodation. No scleral icterus. Extraocular muscles intact.  HEENT: Head atraumatic, normocephalic. Oropharynx and nasopharynx clear.  NECK:  Supple, no jugular venous distention. No thyroid enlargement, no tenderness.  LUNGS: Normal breath sounds bilaterally, no wheezing, bibasilar Rales. No use of accessory muscles of  respiration.  CARDIOVASCULAR: S1, S2 normal. No murmurs, rubs, or gallops.  ABDOMEN: Soft, nontender, nondistended. Bowel sounds present. No organomegaly or mass.  EXTREMITIES: No pedal edema, cyanosis, or clubbing.  NEUROLOGIC: Cranial nerves II through XII are intact. Muscle strength 4/5 in all extremities. Sensation intact. Patient's little lethargic but able answer questions PSYCHIATRIC: The patient is alert and oriented  SKIN: No obvious rash, lesion, or ulcer.   LABORATORY PANEL:   CBC  Recent Labs Lab 12/25/14 0735  12/27/14 0900 12/28/14 1305 12/29/14 0616 12/30/14 0435 12/31/14 0524  WBC 20.4*  < > 8.9 8.1 9.5 10.3 6.8  HGB 9.2*  < > 7.7* 8.1* 8.1* 7.9* 7.4*  HCT 29.5*  < > 23.3* 24.9* 25.3* 24.6* 22.3*  PLT 150  < > 111* 127* 153 197 264  MCV 87.7  < > 84.9 85.1 84.9 85.2 84.8  MCH 27.4  < > 28.1 27.6 27.3 27.3 28.1  MCHC 31.3*  < > 33.1 32.4 32.2 32.1 33.2  RDW 15.8*  < > 16.6* 16.4* 16.9* 16.8* 16.7*  LYMPHSABS 0.5*  --   --   --   --   --   --   MONOABS 1.2*  --   --   --   --   --   --   EOSABS 0.0  --   --   --   --   --   --   BASOSABS 0.0  --   --   --   --   --   --   < > = values in this interval not displayed. ------------------------------------------------------------------------------------------------------------------  Chemistries  Recent Labs Lab 12/27/14 0729  12/28/14 0437 12/28/14 1305 12/29/14 0616 12/30/14 0435 12/31/14 0523  NA 136  < > 137 137 136 132* 135  K 4.3  < > 4.3 4.3 4.1 5.0 3.9  CL 94*  < > 95* 94* 93* 94* 93*  CO2 28  < > 30 30 29 26 30   GLUCOSE 147*  < > 160* 195* 141* 135* 138*  BUN 77*  < > 54* 66* 78* 44* 57*  CREATININE 12.65*  < > 8.86* 9.61* 10.74* 7.11* 9.02*  CALCIUM 8.2*  < > 8.2* 8.2* 8.3* 7.8* 7.9*  MG 2.5*  --   --   --  2.7*  --   --   AST 50*  --  41 34 25 25  --   ALT 61  --  56 49 43 31  --   ALKPHOS 64  --  62 61 60 61  --   BILITOT 5.4*  --  4.3* 3.8* 3.1* 3.0*  --   < > = values in this interval  not displayed. ------------------------------------------------------------------------------------------------------------------ estimated creatinine clearance is 9 mL/min (by C-G formula based on Cr of 9.02). ------------------------------------------------------------------------------------------------------------------ No results for input(s): TSH, T4TOTAL, T3FREE, THYROIDAB in the last 72 hours.  Invalid input(s): FREET3   Coagulation profile No results for input(s): INR, PROTIME in the last 168 hours. ------------------------------------------------------------------------------------------------------------------- No results for input(s): DDIMER in the last 72 hours. -------------------------------------------------------------------------------------------------------------------  Cardiac Enzymes No results for input(s): CKMB, TROPONINI, MYOGLOBIN in the last 168 hours.  Invalid input(s): CK ------------------------------------------------------------------------------------------------------------------ Invalid input(s): POCBNP  ---------------------------------------------------------------------------------------------------------------  Urinalysis No results found for: COLORURINE, APPEARANCEUR, LABSPEC, PHURINE, GLUCOSEU, HGBUR, BILIRUBINUR, KETONESUR, PROTEINUR, UROBILINOGEN, NITRITE, LEUKOCYTESUR   RADIOLOGY: Ct Head Wo Contrast  12/29/2014   CLINICAL DATA:  There is concern that patient is very sleepy and lethargic on. Normally he is very functional and he works as well.  EXAM: CT HEAD WITHOUT CONTRAST  TECHNIQUE: Contiguous axial images were obtained from the base of the skull through the vertex without intravenous contrast.  COMPARISON:  05/30/2013  FINDINGS: The ventricles are normal in configuration. There is ventricular enlargement, greater than sulcal enlargement, stable from the prior studies, consistent with mild to moderate atrophy. No hydrocephalus.  There is a  larger area of encephalomalacia throughout much of the right middle cerebral artery territory reflecting an old infarct. There is a smaller area of posterior left frontal and adjacent left parietal lobe encephalomalacia reflecting a second area of chronic infarction. There is some encephalomalacia in the posterior medial right occipital parietal lobe reflecting an area of old right PCA distribution infarction. There are no parenchymal masses or mass effect. There is no evidence of a recent transcortical infarct.  There are no extra-axial masses or abnormal fluid collections.  There is no intracranial hemorrhage.  Visualized sinuses and mastoid air cells are clear.  IMPRESSION: 1. No acute intracranial abnormalities. 2. Atrophy and multiple old infarcts, unchanged from the prior imaging.   Electronically Signed   By: Lajean Manes M.D.   On: 12/29/2014 16:10    EKG: Orders placed or performed during the hospital encounter of 12/25/14  . EKG  . EKG 12-Lead  . EKG 12-Lead  . EKG 12-Lead  . EKG 12-Lead  . EKG 12-Lead  . EKG 12-Lead  . EKG 12-Lead  . EKG 12-Lead    IMPRESSION AND PLAN:  Sepsis with  aspiration pneumonia and bacteremia. B/C: GNR, d/c vancomycin, d/c zosyn, change to cipro per B/C sensitivity (Klebsiella and  enterococcus), f/u  ID consult. - Aspiration pneumonia/HCAP - Pulmonary toilet, flutter valve, nebs, Mucinex,  Change to cipro.   End-stage renal disease Nephrology consulted to resume his hemodialysis- currently receiving - Continue with Renagel and renal diet.  Atrial fibrillation New onset,  echo showing systolic dysfunction., tsh low ft4 elevated possible hyperthyrodism, appreciate underwent port  Elevated Bilirubin - Ultrasound RUQ which shows some gallbladder sludge but the common bile duct size is normal.  hida abnormal. Per Dr. Copper, no indication for acute cholecystectomy patient Ward to have further GI symptoms recommended a drain be placed.  History of  CVA Head CT scan neg, normal ammonia level.   Diabetes mellitus on insulin sliding scale  Anemia of chronic disease.  Stable. D/w pt's mother, wife and Dr. Candiss Norse.  TOTAL TIME TAKING CARE OF THIS PATIENT: 70  min  Demetrios Loll M.D on 12/31/2014 at 2:46 PM  Between 7am to 6pm - Pager - [phone]  After 6pm go to [email or web address] - password [email or web address] [facility]  Office  [phone]  CC: Primary care physician; Miguel Aschoff, MD

## 2014-12-31 NOTE — Clinical Social Work Note (Signed)
Physician states waiting on ID consult and because orders are not able to be placed for discharge today, [facility] will not be able to accept until Monday. CSW has updated Surveyor, minerals at [facility].  Shela Leff MSW,LCSWA [phone]

## 2014-12-31 NOTE — Progress Notes (Signed)
Initial Nutrition Assessment  DOCUMENTATION CODES:     INTERVENTION:  Meals and Snacks: Cater to patient preferences within diet restrictions  NUTRITION DIAGNOSIS:  No nutrition diagnosis at this time   GOAL:  Patient will meet greater than or equal to 90% of their needs  MONITOR:  Energy Intake Electrolyte and Renal Profile Digestive system UOP Anthropometrics  REASON FOR ASSESSMENT:  Other (Comment) (RD Screen, Length of Stay)    ASSESSMENT:  Pt admitted with weakness and pna PMHx: ESRD on HD, DM, CAD, stroke  PO Intake: Pt out of room this am. Per hostess staff, on pick up pt ate 100% of breakfast this am. Recorded po intake 75-100% of meals since admission. Per MST no decrease in appetite PTA  Medications: Renvela Electrolyte and Renal Profile:    Recent Labs Lab 12/27/14 0729 12/27/14 0900  12/29/14 0616 12/30/14 0435 12/31/14 0523  BUN 77* 81*  < > 78* 44* 57*  CREATININE 12.65* 13.19*  < > 10.74* 7.11* 9.02*  NA 136 136  < > 136 132* 135  K 4.3 4.2  < > 4.1 5.0 3.9  MG 2.5*  --   --  2.7*  --   --   PHOS  --  4.6  --   --   --  3.7  < > = values in this interval not displayed. Glucose Profile:  Recent Labs  12/30/14 1639 12/30/14 2117 12/31/14 0748  GLUCAP 134* 148* 121*   Protein Profile:  Recent Labs Lab 12/29/14 0616 12/30/14 0435 12/31/14 0523  ALBUMIN 2.2* 1.9* 1.9*   Per MST no decrease in weight PTA  Height:  Ht Readings from Last 1 Encounters:  12/25/14 5\' 8"  (1.727 m)    Weight:  Wt Readings from Last 1 Encounters:  12/31/14 192 lb 0.3 oz (87.1 kg)    Ideal Body Weight:     Wt Readings from Last 10 Encounters:  12/31/14 192 lb 0.3 oz (87.1 kg)  12/23/14 180 lb (81.647 kg)    Unable to complete Nutrition-Focused physical exam at this time.   BMI:  Body mass index is 29.2 kg/(m^2).  Diet Order:  Diet Carb Modified Fluid consistency:: Thin; Room service appropriate?: Yes; Fluid restriction:: 1200 mL  Fluid  EDUCATION NEEDS:  No education needs identified at this time   Intake/Output Summary (Last 24 hours) at 12/31/14 1101 Last data filed at 12/31/14 0833  Gross per 24 hour  Intake   1001 ml  Output      1 ml  Net   1000 ml    Last BM:  5/13  Alcorn, RD, LDN Pager [phone]

## 2014-12-31 NOTE — Clinical Social Work Note (Signed)
Patient ambulated well with physical therapy and they are no longer recommending short term rehab. Patient is excited and so is his wife. MD notified and updated. RN CM to arrange home health.  Shela Leff MSW,LCSWA [phone]

## 2014-12-31 NOTE — Progress Notes (Signed)
Subjective:   Patient seen during dialysis Tolerating well  Resting quietly   states he is eating better Objective:  Vital signs in last 24 hours:  Temp:  [97.5 F (36.4 C)-98.1 F (36.7 C)] 98.1 F (36.7 C) (05/13 0922) Pulse Rate:  [57-62] 61 (05/13 1000) Resp:  [15-16] 15 (05/13 0930) BP: (102-120)/(57-66) 120/66 mmHg (05/13 1000) SpO2:  [83 %-100 %] 100 % (05/13 0748) Weight:  [87.181 kg (192 lb 3.2 oz)] 87.181 kg (192 lb 3.2 oz) (05/13 0033)  Weight change: 1.588 kg (3 lb 8 oz) Filed Weights   12/29/14 0442 12/30/14 0407 12/31/14 0033  Weight: 85.639 kg (188 lb 12.8 oz) 85.594 kg (188 lb 11.2 oz) 87.181 kg (192 lb 3.2 oz)    Intake/Output: I/O last 3 completed shifts: In: 1160 [P.O.:1160] Out: 0    Intake/Output this shift:  Total I/O In: 81 [IV Piggyback:81] Out: 1 [Stool:1]  Physical Exam: General: NAD,   Head: Normocephalic, atraumatic. Moist oral mucosal membranes  Eyes: Anicteric,  Neck: Supple, trachea midline  Lungs:  Clear to auscultation  Heart: Regular rate and rhythm  Abdomen:  Soft, nontender,   Extremities:  + peripheral edema.  Neurologic: Nonfocal, moving all four extremities  Skin: No lesions  Access: AVF     Basic Metabolic Panel:  Recent Labs Lab 12/27/14 0729 12/27/14 0900 12/28/14 0437 12/28/14 1305 12/29/14 0616 12/30/14 0435 12/31/14 0523  NA 136 136 137 137 136 132* 135  K 4.3 4.2 4.3 4.3 4.1 5.0 3.9  CL 94* 95* 95* 94* 93* 94* 93*  CO2 28 26 30 30 29 26 30   GLUCOSE 147* 163* 160* 195* 141* 135* 138*  BUN 77* 81* 54* 66* 78* 44* 57*  CREATININE 12.65* 13.19* 8.86* 9.61* 10.74* 7.11* 9.02*  CALCIUM 8.2* 8.0* 8.2* 8.2* 8.3* 7.8* 7.9*  MG 2.5*  --   --   --  2.7*  --   --   PHOS  --  4.6  --   --   --   --  3.7    Liver Function Tests:  Recent Labs Lab 12/27/14 0729  12/28/14 0437 12/28/14 1305 12/29/14 0616 12/30/14 0435 12/31/14 0523  AST 50*  --  41 34 25 25  --   ALT 61  --  56 49 43 31  --   ALKPHOS 64   --  62 61 60 61  --   BILITOT 5.4*  --  4.3* 3.8* 3.1* 3.0*  --   PROT 6.2*  --  6.2* 6.1* 6.4* 5.8*  --   ALBUMIN 2.2*  < > 2.3* 2.2* 2.2* 1.9* 1.9*  < > = values in this interval not displayed. No results for input(s): LIPASE, AMYLASE in the last 168 hours.  Recent Labs Lab 12/27/14 0730 12/29/14 1527  AMMONIA 22 24    CBC:  Recent Labs Lab 12/25/14 0735  12/27/14 0900 12/28/14 1305 12/29/14 0616 12/30/14 0435 12/31/14 0524  WBC 20.4*  < > 8.9 8.1 9.5 10.3 6.8  NEUTROABS 18.6*  --   --   --   --   --   --   HGB 9.2*  < > 7.7* 8.1* 8.1* 7.9* 7.4*  HCT 29.5*  < > 23.3* 24.9* 25.3* 24.6* 22.3*  MCV 87.7  < > 84.9 85.1 84.9 85.2 84.8  PLT 150  < > 111* 127* 153 197 264  < > = values in this interval not displayed.  Cardiac Enzymes: No results for input(s): CKTOTAL, CKMB,  CKMBINDEX, TROPONINI in the last 168 hours.  BNP: Invalid input(s): POCBNP  CBG:  Recent Labs Lab 12/30/14 0731 12/30/14 1118 12/30/14 1639 12/30/14 2117 12/31/14 0748  GLUCAP 122* 150* 134* 148* 121*    Microbiology: Results for orders placed or performed during the hospital encounter of 12/25/14  Blood Culture (routine x 2)     Status: None   Collection Time: 12/25/14  7:35 AM  Result Value Ref Range Status   Specimen Description BLOOD  Final   Special Requests NONE  Final   Culture  Setup Time   Final    GRAM NEGATIVE RODS IN BOTH AEROBIC AND ANAEROBIC BOTTLES GRAM POSITIVE COCCI AEROBIC BOTTLE ONLY    Culture   Final    KLEBSIELLA PNEUMONIAE IN BOTH AEROBIC AND ANAEROBIC BOTTLES ENTEROCOCCUS FAECALIS AEROBIC BOTTLE ONLY CRITICAL RESULT CALLED TO, READ BACK BY AND VERIFIED WITH: ERICA TAYLOR AT 6415 ON 12/26/14 RWW    Report Status 12/29/2014 FINAL  Final   Organism ID, Bacteria KLEBSIELLA PNEUMONIAE  Final   Organism ID, Bacteria ENTEROCOCCUS FAECALIS  Final      Susceptibility   Klebsiella pneumoniae - MIC*    AMPICILLIN 16 RESISTANT Resistant     CEFTAZIDIME <=1 SENSITIVE  Sensitive     CEFAZOLIN <=4 SENSITIVE Sensitive     CEFTRIAXONE <=1 SENSITIVE Sensitive     CIPROFLOXACIN <=0.25 SENSITIVE Sensitive     GENTAMICIN <=1 SENSITIVE Sensitive     IMIPENEM <=0.25 SENSITIVE Sensitive     TRIMETH/SULFA <=20 SENSITIVE Sensitive     CEFOXITIN <=4 SENSITIVE Sensitive     * KLEBSIELLA PNEUMONIAE   Enterococcus faecalis - MIC*    AMPICILLIN <=2 SENSITIVE Sensitive     CIPROFLOXACIN <=0.5 SENSITIVE Sensitive     LEVOFLOXACIN 1 SENSITIVE Sensitive     TETRACYCLINE >=16 RESISTANT Resistant     LINEZOLID 2 SENSITIVE Sensitive     * ENTEROCOCCUS FAECALIS  Blood Culture (routine x 2)     Status: None   Collection Time: 12/25/14  7:35 AM  Result Value Ref Range Status   Specimen Description BLOOD  Final   Special Requests NONE  Final   Culture  Setup Time   Final    GRAM NEGATIVE RODS IN BOTH AEROBIC AND ANAEROBIC BOTTLES GRAM POSITIVE COCCI AEROBIC BOTTLE ONLY    Culture   Final    KLEBSIELLA PNEUMONIAE IN BOTH AEROBIC AND ANAEROBIC BOTTLES CRITICAL VALUE NOTED.  VALUE IS CONSISTENT WITH PREVIOUSLY REPORTED AND CALLED VALUE. ENTEROCOCCUS FAECALIS AEROBIC BOTTLE ONLY CRITICAL VALUE NOTED.  VALUE IS CONSISTENT WITH PREVIOUSLY REPORTED AND CALLED VALUE.    Report Status 12/29/2014 FINAL  Final   Organism ID, Bacteria KLEBSIELLA PNEUMONIAE  Final   Organism ID, Bacteria ENTEROCOCCUS FAECALIS  Final      Susceptibility   Klebsiella pneumoniae - MIC*    AMPICILLIN 16 RESISTANT Resistant     CEFTAZIDIME <=1 SENSITIVE Sensitive     CEFAZOLIN <=4 SENSITIVE Sensitive     CEFTRIAXONE <=1 SENSITIVE Sensitive     CIPROFLOXACIN <=0.25 SENSITIVE Sensitive     GENTAMICIN <=1 SENSITIVE Sensitive     IMIPENEM <=0.25 SENSITIVE Sensitive     TRIMETH/SULFA <=20 SENSITIVE Sensitive     CEFOXITIN <=4 SENSITIVE Sensitive     * KLEBSIELLA PNEUMONIAE   Enterococcus faecalis - MIC*    AMPICILLIN <=2 SENSITIVE Sensitive     CIPROFLOXACIN <=0.5 SENSITIVE Sensitive      LEVOFLOXACIN 1 SENSITIVE Sensitive     TETRACYCLINE >=16 RESISTANT Resistant  LINEZOLID 2 SENSITIVE Sensitive     * ENTEROCOCCUS FAECALIS  Influenza A&B Antigens [facility])     Status: None   Collection Time: 12/25/14 12:47 PM  Result Value Ref Range Status   Influenza A ([facility]) NEGATIVE  Final   Influenza B [facility]) NEGATIVE  Final    Coagulation Studies: No results for input(s): LABPROT, INR in the last 72 hours.  Urinalysis: No results for input(s): COLORURINE, LABSPEC, PHURINE, GLUCOSEU, HGBUR, BILIRUBINUR, KETONESUR, PROTEINUR, UROBILINOGEN, NITRITE, LEUKOCYTESUR in the last 72 hours.  Invalid input(s): APPERANCEUR    Imaging: Ct Head Wo Contrast  12/29/2014   CLINICAL DATA:  There is concern that patient is very sleepy and lethargic on. Normally he is very functional and he works as well.  EXAM: CT HEAD WITHOUT CONTRAST  TECHNIQUE: Contiguous axial images were obtained from the base of the skull through the vertex without intravenous contrast.  COMPARISON:  05/30/2013  FINDINGS: The ventricles are normal in configuration. There is ventricular enlargement, greater than sulcal enlargement, stable from the prior studies, consistent with mild to moderate atrophy. No hydrocephalus.  There is a larger area of encephalomalacia throughout much of the right middle cerebral artery territory reflecting an old infarct. There is a smaller area of posterior left frontal and adjacent left parietal lobe encephalomalacia reflecting a second area of chronic infarction. There is some encephalomalacia in the posterior medial right occipital parietal lobe reflecting an area of old right PCA distribution infarction. There are no parenchymal masses or mass effect. There is no evidence of a recent transcortical infarct.  There are no extra-axial masses or abnormal fluid collections.  There is no intracranial hemorrhage.  Visualized sinuses and mastoid air cells are clear.  IMPRESSION: 1. No acute intracranial  abnormalities. 2. Atrophy and multiple old infarcts, unchanged from the prior imaging.   Electronically Signed   By: Lajean Manes M.D.   On: 12/29/2014 16:10     Medications:     . epoetin (EPOGEN/PROCRIT) injection  20,000 Units Subcutaneous Weekly  . heparin  5,000 Units Subcutaneous 3 times per day  . metoprolol tartrate  50 mg Oral Q6H  . piperacillin-tazobactam (ZOSYN)  IV  3.375 g Intravenous Q12H  . pneumococcal 23 valent vaccine  0.5 mL Intramuscular Tomorrow-1000  . sevelamer carbonate  3,200 mg Oral TID WC   acetaminophen **OR** acetaminophen, albuterol, menthol-cetylpyridinium, ondansetron **OR** ondansetron (ZOFRAN) IV, polyethylene glycol  Assessment/ Plan:  64 y.o. male End stage renal disease, hypertension, diabetes mellitus type II, CVA, coronary artery disease who [facility] on 12/25/2014 for right lower lobe pneumonia  MWF [facility] [place]   1. End Stage Renal Disease: MWF. Patient seen during treatment  Tolerating well/   2. Pneumonia with sepsis, Klebsiella, Enterococcus  Abx as per IM team  3. Anemia of chronic kidney disease: hemoglobin low, decreasing - continue SQ EPO  4. Secondary Hyperparathyroidism:  - sevelamer with meals.     LOS: 6 Luis Walter 5/13/201610:26 AM

## 2014-12-31 NOTE — Progress Notes (Signed)
HD tx completed.

## 2014-12-31 NOTE — Consult Note (Signed)
Reason for Consult: sepsis, bacteremia, pna    Referring Physician: Bridgett Larsson  Active Problems:   Pneumonia   ESRD (end stage renal disease)   DM (diabetes mellitus)   CVA (cerebral infarction)   HPI: Luis Walter is a 64 y.o. male  with a known history of end-stage renal disease on hemodialysis Monday/Wednesday/Friday, history of diabetes medication controlled with diet, history of CVA on full dose aspirin, patient admitted May 11th with complaints of weakness, cough, shortness of breath, and confusion, Found to be  septic with leukocytosis of 20,000, fever of 103, chest x-ray showing right lung basal haziness suspicious for aspiration pneumonia, and was started on IV vancomycin and Zosyn in ED after blood cultures were obtained [facility] have been + for Klebsiella and enterococcus. Has not had fever since 5/7. WBC stable down to 6.8.  Past Medical History  Diagnosis Date  . ESRD (end stage renal disease)     a. on HD M,W,F; b. previously on transplant list at [facility] - removed 2016  . DM2 (diabetes mellitus, type 2)   . CAD (coronary artery disease)     a. cardiac cath 2014: LM nl, mLAD 30%, dLAD 30%, ostLCx 50%, mid ramus 20% OM3 80% pRCA 20%, mRCA 20%, PDA 100% w/ left to right collats, medically managed   . Stroke    History reviewed. No pertinent past surgical history.  Allergies: No Known Allergies  Current antibiotics:  Antibiotics Given (last 72 hours)    Date/Time Action Medication Dose Rate   12/28/14 1732 Given   piperacillin-tazobactam (ZOSYN) IVPB 3.375 g 3.375 g 12.5 mL/hr   12/29/14 0438 Given   piperacillin-tazobactam (ZOSYN) IVPB 3.375 g 3.375 g 12.5 mL/hr   12/29/14 1448 Given   vancomycin (VANCOCIN) IVPB 1000 mg/200 mL premix 1,000 mg 200 mL/hr   12/29/14 1656 Given   piperacillin-tazobactam (ZOSYN) IVPB 3.375 g 3.375 g 12.5 mL/hr   12/30/14 3846 Given   piperacillin-tazobactam (ZOSYN) IVPB 3.375 g 3.375 g 12.5 mL/hr   12/30/14 1822 Given   piperacillin-tazobactam  (ZOSYN) IVPB 3.375 g 3.375 g 12.5 mL/hr   12/31/14 0538 Given   piperacillin-tazobactam (ZOSYN) IVPB 3.375 g 3.375 g 12.5 mL/hr      MEDICATIONS: . epoetin (EPOGEN/PROCRIT) injection  20,000 Units Subcutaneous Weekly  . heparin  5,000 Units Subcutaneous 3 times per day  . metoprolol tartrate  50 mg Oral Q6H  . pneumococcal 23 valent vaccine  0.5 mL Intramuscular Tomorrow-1000  . sevelamer carbonate  3,200 mg Oral TID WC    History  Substance Use Topics  . Smoking status: Former Research scientist (life sciences)  . Smokeless tobacco: Not on file  . Alcohol Use: No    Family History  Problem Relation Age of Onset  . Hypertension    . Diabetes      Review of Systems - 11 systems reviewed and negative per HPI  OBJECTIVE: Temp:  [97.5 F (36.4 C)-98.3 F (36.8 C)] 97.6 F (36.4 C) (05/13 1333) Pulse Rate:  [57-63] 63 (05/13 1333) Resp:  [15-18] 16 (05/13 1333) BP: (102-120)/(56-67) 109/56 mmHg (05/13 1333) SpO2:  [83 %-100 %] 95 % (05/13 1333) Weight:  [82.3 kg (181 lb 7 oz)-87.181 kg (192 lb 3.2 oz)] 82.3 kg (181 lb 7 oz) (05/13 1304)  GENERAL: 64 y.o.-year-old patient lying in the bed , frail, ill-appearing EYES: Pupils equal, round, reactive to light and accommodation. No scleral icterus. Extraocular muscles intact.  HEENT: Head atraumatic, normocephalic. Oropharynx and nasopharynx clear.  NECK: Supple, no jugular venous distention.  No thyroid enlargement, no tenderness.  LUNGS: Normal breath sounds bilaterally, no wheezing, bibasilar Rales. No use of accessory muscles of respiration.  CARDIOVASCULAR: S1, S2 normal. No murmurs, rubs, or gallops.  ABDOMEN: Soft, nontender, nondistended. Bowel sounds present. No organomegaly or mass.  EXTREMITIES: No pedal edema, cyanosis, or clubbing.  NEUROLOGIC: Cranial nerves II through XII are intact. Muscle strength 4/5 in all extremities. Sensation intact.PSYCHIATRIC: The patient is alert and oriented  SKIN: No obvious rash, lesion, or ulcer.  L  forearm AVG with good thill, non tender  LABS: Results for orders placed or performed during the hospital encounter of 12/25/14 (from the past 48 hour(s))  Ammonia     Status: None   Collection Time: 12/29/14  3:27 PM  Result Value Ref Range   Ammonia 24 9 - 35 umol/L  Blood gas, arterial     Status: Abnormal   Collection Time: 12/29/14  4:30 PM  Result Value Ref Range   FIO2 0.21 %   pH, Arterial 7.52 (H) 7.350 - 7.450   pCO2 arterial 40 32.0 - 48.0 mmHg   pO2, Arterial 56 (L) 83.0 - 108.0 mmHg   Bicarbonate 32.7 (H) 21.0 - 28.0 mEq/L   Acid-Base Excess 9.0 (H) 0.0 - 3.0 mmol/L   O2 Saturation 91.8 %   Patient temperature 37.0    Collection site RIGHT RADIAL    Sample type ARTERIAL DRAW    Allens test (pass/fail) YES (A) PASS  Glucose, capillary     Status: Abnormal   Collection Time: 12/29/14  4:40 PM  Result Value Ref Range   Glucose-Capillary 133 (H) 70 - 99 mg/dL  Glucose, capillary     Status: Abnormal   Collection Time: 12/29/14  7:57 PM  Result Value Ref Range   Glucose-Capillary 143 (H) 70 - 99 mg/dL   Comment 1 Notify RN   Glucose, capillary     Status: Abnormal   Collection Time: 12/29/14  9:48 PM  Result Value Ref Range   Glucose-Capillary 150 (H) 70 - 99 mg/dL   Comment 1 Notify RN   Comprehensive metabolic panel     Status: Abnormal   Collection Time: 12/30/14  4:35 AM  Result Value Ref Range   Sodium 132 (L) 135 - 145 mmol/L   Potassium 5.0 3.5 - 5.1 mmol/L    Comment: HEMOLYSIS AT THIS LEVEL MAY AFFECT RESULT   Chloride 94 (L) 101 - 111 mmol/L   CO2 26 22 - 32 mmol/L   Glucose, Bld 135 (H) 65 - 99 mg/dL   BUN 44 (H) 6 - 20 mg/dL   Creatinine, Ser 7.11 (H) 0.61 - 1.24 mg/dL   Calcium 7.8 (L) 8.9 - 10.3 mg/dL   Total Protein 5.8 (L) 6.5 - 8.1 g/dL   Albumin 1.9 (L) 3.5 - 5.0 g/dL   AST 25 15 - 41 U/L   ALT 31 17 - 63 U/L   Alkaline Phosphatase 61 38 - 126 U/L   Total Bilirubin 3.0 (H) 0.3 - 1.2 mg/dL   GFR calc non Af Amer 7 (L) >60 mL/min   GFR  calc Af Amer 9 (L) >60 mL/min    Comment: (NOTE) The eGFR has been calculated using the CKD EPI equation. This calculation has not been validated in all clinical situations. eGFR's persistently <60 mL/min signify possible Chronic Kidney Disease.    Anion gap 12 5 - 15  CBC     Status: Abnormal   Collection Time: 12/30/14  4:35 AM  Result  Value Ref Range   WBC 10.3 3.8 - 10.6 K/uL   RBC 2.89 (L) 4.40 - 5.90 MIL/uL   Hemoglobin 7.9 (L) 13.0 - 18.0 g/dL   HCT 24.6 (L) 40.0 - 52.0 %   MCV 85.2 80.0 - 100.0 fL   MCH 27.3 26.0 - 34.0 pg   MCHC 32.1 32.0 - 36.0 g/dL   RDW 16.8 (H) 11.5 - 14.5 %   Platelets 197 150 - 440 K/uL  Glucose, capillary     Status: Abnormal   Collection Time: 12/30/14  7:31 AM  Result Value Ref Range   Glucose-Capillary 122 (H) 65 - 99 mg/dL  Glucose, capillary     Status: Abnormal   Collection Time: 12/30/14 11:18 AM  Result Value Ref Range   Glucose-Capillary 150 (H) 65 - 99 mg/dL  Glucose, capillary     Status: Abnormal   Collection Time: 12/30/14  4:39 PM  Result Value Ref Range   Glucose-Capillary 134 (H) 65 - 99 mg/dL  Glucose, capillary     Status: Abnormal   Collection Time: 12/30/14  9:17 PM  Result Value Ref Range   Glucose-Capillary 148 (H) 65 - 99 mg/dL  Renal function panel     Status: Abnormal   Collection Time: 12/31/14  5:23 AM  Result Value Ref Range   Sodium 135 135 - 145 mmol/L   Potassium 3.9 3.5 - 5.1 mmol/L   Chloride 93 (L) 101 - 111 mmol/L   CO2 30 22 - 32 mmol/L   Glucose, Bld 138 (H) 65 - 99 mg/dL   BUN 57 (H) 6 - 20 mg/dL   Creatinine, Ser 9.02 (H) 0.61 - 1.24 mg/dL   Calcium 7.9 (L) 8.9 - 10.3 mg/dL   Phosphorus 3.7 2.5 - 4.6 mg/dL   Albumin 1.9 (L) 3.5 - 5.0 g/dL   GFR calc non Af Amer 6 (L) >60 mL/min   GFR calc Af Amer 6 (L) >60 mL/min    Comment: (NOTE) The eGFR has been calculated using the CKD EPI equation. This calculation has not been validated in all clinical situations. eGFR's persistently <60 mL/min signify  possible Chronic Kidney Disease.    Anion gap 12 5 - 15  CBC     Status: Abnormal   Collection Time: 12/31/14  5:24 AM  Result Value Ref Range   WBC 6.8 3.8 - 10.6 K/uL   RBC 2.63 (L) 4.40 - 5.90 MIL/uL   Hemoglobin 7.4 (L) 13.0 - 18.0 g/dL   HCT 22.3 (L) 40.0 - 52.0 %   MCV 84.8 80.0 - 100.0 fL   MCH 28.1 26.0 - 34.0 pg   MCHC 33.2 32.0 - 36.0 g/dL   RDW 16.7 (H) 11.5 - 14.5 %   Platelets 264 150 - 440 K/uL  Glucose, capillary     Status: Abnormal   Collection Time: 12/31/14  7:48 AM  Result Value Ref Range   Glucose-Capillary 121 (H) 65 - 99 mg/dL  Glucose, capillary     Status: Abnormal   Collection Time: 12/31/14  1:47 PM  Result Value Ref Range   Glucose-Capillary 100 (H) 65 - 99 mg/dL   No components found for: ESR, C REACTIVE PROTEIN MICRO: Results for orders placed or performed during the hospital encounter of 12/25/14  Blood Culture (routine x 2)     Status: None   Collection Time: 12/25/14  7:35 AM  Result Value Ref Range Status   Specimen Description BLOOD  Final   Special Requests NONE  Final  Culture  Setup Time   Final    GRAM NEGATIVE RODS IN BOTH AEROBIC AND ANAEROBIC BOTTLES GRAM POSITIVE COCCI AEROBIC BOTTLE ONLY    Culture   Final    KLEBSIELLA PNEUMONIAE IN BOTH AEROBIC AND ANAEROBIC BOTTLES ENTEROCOCCUS FAECALIS AEROBIC BOTTLE ONLY CRITICAL RESULT CALLED TO, READ BACK BY AND VERIFIED WITH: ERICA TAYLOR AT [id number] ON 12/26/14 RWW    Report Status 12/29/2014 FINAL  Final   Organism ID, Bacteria KLEBSIELLA PNEUMONIAE  Final   Organism ID, Bacteria ENTEROCOCCUS FAECALIS  Final      Susceptibility   Klebsiella pneumoniae - MIC*    AMPICILLIN 16 RESISTANT Resistant     CEFTAZIDIME <=1 SENSITIVE Sensitive     CEFAZOLIN <=4 SENSITIVE Sensitive     CEFTRIAXONE <=1 SENSITIVE Sensitive     CIPROFLOXACIN <=0.25 SENSITIVE Sensitive     GENTAMICIN <=1 SENSITIVE Sensitive     IMIPENEM <=0.25 SENSITIVE Sensitive     TRIMETH/SULFA <=20 SENSITIVE Sensitive      CEFOXITIN <=4 SENSITIVE Sensitive     * KLEBSIELLA PNEUMONIAE   Enterococcus faecalis - MIC*    AMPICILLIN <=2 SENSITIVE Sensitive     CIPROFLOXACIN <=0.5 SENSITIVE Sensitive     LEVOFLOXACIN 1 SENSITIVE Sensitive     TETRACYCLINE >=16 RESISTANT Resistant     LINEZOLID 2 SENSITIVE Sensitive     * ENTEROCOCCUS FAECALIS  Blood Culture (routine x 2)     Status: None   Collection Time: 12/25/14  7:35 AM  Result Value Ref Range Status   Specimen Description BLOOD  Final   Special Requests NONE  Final   Culture  Setup Time   Final    GRAM NEGATIVE RODS IN BOTH AEROBIC AND ANAEROBIC BOTTLES GRAM POSITIVE COCCI AEROBIC BOTTLE ONLY    Culture   Final    KLEBSIELLA PNEUMONIAE IN BOTH AEROBIC AND ANAEROBIC BOTTLES CRITICAL VALUE NOTED.  VALUE IS CONSISTENT WITH PREVIOUSLY REPORTED AND CALLED VALUE. ENTEROCOCCUS FAECALIS AEROBIC BOTTLE ONLY CRITICAL VALUE NOTED.  VALUE IS CONSISTENT WITH PREVIOUSLY REPORTED AND CALLED VALUE.    Report Status 12/29/2014 FINAL  Final   Organism ID, Bacteria KLEBSIELLA PNEUMONIAE  Final   Organism ID, Bacteria ENTEROCOCCUS FAECALIS  Final      Susceptibility   Klebsiella pneumoniae - MIC*    AMPICILLIN 16 RESISTANT Resistant     CEFTAZIDIME <=1 SENSITIVE Sensitive     CEFAZOLIN <=4 SENSITIVE Sensitive     CEFTRIAXONE <=1 SENSITIVE Sensitive     CIPROFLOXACIN <=0.25 SENSITIVE Sensitive     GENTAMICIN <=1 SENSITIVE Sensitive     IMIPENEM <=0.25 SENSITIVE Sensitive     TRIMETH/SULFA <=20 SENSITIVE Sensitive     CEFOXITIN <=4 SENSITIVE Sensitive     * KLEBSIELLA PNEUMONIAE   Enterococcus faecalis - MIC*    AMPICILLIN <=2 SENSITIVE Sensitive     CIPROFLOXACIN <=0.5 SENSITIVE Sensitive     LEVOFLOXACIN 1 SENSITIVE Sensitive     TETRACYCLINE >=16 RESISTANT Resistant     LINEZOLID 2 SENSITIVE Sensitive     * ENTEROCOCCUS FAECALIS  Influenza A&B Antigens [facility])     Status: None   Collection Time: 12/25/14 12:47 PM  Result Value Ref Range Status   Influenza A  ([facility]) NEGATIVE  Final   Influenza B ([facility]) NEGATIVE  Final    IMAGING: Ct Head Wo Contrast  12/29/2014   CLINICAL DATA:  There is concern that patient is very sleepy and lethargic on. Normally he is very functional and he works as well.  EXAM: CT  HEAD WITHOUT CONTRAST  TECHNIQUE: Contiguous axial images were obtained from the base of the skull through the vertex without intravenous contrast.  COMPARISON:  05/30/2013  FINDINGS: The ventricles are normal in configuration. There is ventricular enlargement, greater than sulcal enlargement, stable from the prior studies, consistent with mild to moderate atrophy. No hydrocephalus.  There is a larger area of encephalomalacia throughout much of the right middle cerebral artery territory reflecting an old infarct. There is a smaller area of posterior left frontal and adjacent left parietal lobe encephalomalacia reflecting a second area of chronic infarction. There is some encephalomalacia in the posterior medial right occipital parietal lobe reflecting an area of old right PCA distribution infarction. There are no parenchymal masses or mass effect. There is no evidence of a recent transcortical infarct.  There are no extra-axial masses or abnormal fluid collections.  There is no intracranial hemorrhage.  Visualized sinuses and mastoid air cells are clear.  IMPRESSION: 1. No acute intracranial abnormalities. 2. Atrophy and multiple old infarcts, unchanged from the prior imaging.   Electronically Signed   By: Lajean Manes M.D.   On: 12/29/2014 16:10    HISTORICAL MICRO/IMAGING  Assessment/Plan:   Luis Walter is a 64 y.o. male with a known history of end-stage renal disease on hemodialysis Monday/Wednesday/Friday, history of diabetes medication controlled with diet, history of CVA on full dose aspirin, patient admitted May 11th with complaints of weakness, cough, shortness of breath, and confusion, Found to be septic with leukocytosis of 20,000, fever of 103,  chest x-ray showing right lung basal haziness suspicious for aspiration pneumonia, and was started on IV vancomycin and Zosyn in ED after blood cultures were obtained [facility] have been + for Klebsiella and enterococcus. Has not had fever since 5/7. WBC stable down to 6.8.  Feels quite well currently  Rec Transition to oral cipro 500 qd for 10 more days then stop abx WIll need to monitor after stopping abx to ensure he has not seeded his AVG although unlikely. Thank you for the consult.

## 2014-12-31 NOTE — Progress Notes (Signed)
ANTIBIOTIC CONSULT NOTE - INITIAL  Pharmacy Consult for Cipro Indication: Bacteremia  No Known Allergies  Patient Measurements: Height: 5\' 8"  (172.7 cm) Weight: 181 lb 7 oz (82.3 kg) IBW/kg (Calculated) : 68.4 Adjusted Body Weight:    Vital Signs: Temp: 97.6 F (36.4 C) (05/13 1333) Temp Source: Oral (05/13 1333) BP: 109/56 mmHg (05/13 1333) Pulse Rate: 63 (05/13 1333) Intake/Output from previous day: 05/12 0701 - 05/13 0700 In: 1160 [P.O.:1160] Out: 0  Intake/Output from this shift: Total I/O In: 81 [IV Piggyback:81] Out: 1501 [Other:1500; Stool:1]  Labs:  Recent Labs  12/29/14 0616 12/30/14 0435 12/31/14 0523 12/31/14 0524  WBC 9.5 10.3  --  6.8  HGB 8.1* 7.9*  --  7.4*  PLT 153 197  --  264  CREATININE 10.74* 7.11* 9.02*  --    Estimated Creatinine Clearance: 9 mL/min (by C-G formula based on Cr of 9.02). Patient on Hemodialysis   Microbiology: Recent Results (from the past 720 hour(s))  Blood Culture (routine x 2)     Status: None   Collection Time: 12/25/14  7:35 AM  Result Value Ref Range Status   Specimen Description BLOOD  Final   Special Requests NONE  Final   Culture  Setup Time   Final    GRAM NEGATIVE RODS IN BOTH AEROBIC AND ANAEROBIC BOTTLES GRAM POSITIVE COCCI AEROBIC BOTTLE ONLY    Culture   Final    KLEBSIELLA PNEUMONIAE IN BOTH AEROBIC AND ANAEROBIC BOTTLES ENTEROCOCCUS FAECALIS AEROBIC BOTTLE ONLY CRITICAL RESULT CALLED TO, READ BACK BY AND VERIFIED WITH: ERICA TAYLOR AT 3810 ON 12/26/14 RWW    Report Status 12/29/2014 FINAL  Final   Organism ID, Bacteria KLEBSIELLA PNEUMONIAE  Final   Organism ID, Bacteria ENTEROCOCCUS FAECALIS  Final      Susceptibility   Klebsiella pneumoniae - MIC*    AMPICILLIN 16 RESISTANT Resistant     CEFTAZIDIME <=1 SENSITIVE Sensitive     CEFAZOLIN <=4 SENSITIVE Sensitive     CEFTRIAXONE <=1 SENSITIVE Sensitive     CIPROFLOXACIN <=0.25 SENSITIVE Sensitive     GENTAMICIN <=1 SENSITIVE Sensitive    IMIPENEM <=0.25 SENSITIVE Sensitive     TRIMETH/SULFA <=20 SENSITIVE Sensitive     CEFOXITIN <=4 SENSITIVE Sensitive     * KLEBSIELLA PNEUMONIAE   Enterococcus faecalis - MIC*    AMPICILLIN <=2 SENSITIVE Sensitive     CIPROFLOXACIN <=0.5 SENSITIVE Sensitive     LEVOFLOXACIN 1 SENSITIVE Sensitive     TETRACYCLINE >=16 RESISTANT Resistant     LINEZOLID 2 SENSITIVE Sensitive     * ENTEROCOCCUS FAECALIS  Blood Culture (routine x 2)     Status: None   Collection Time: 12/25/14  7:35 AM  Result Value Ref Range Status   Specimen Description BLOOD  Final   Special Requests NONE  Final   Culture  Setup Time   Final    GRAM NEGATIVE RODS IN BOTH AEROBIC AND ANAEROBIC BOTTLES GRAM POSITIVE COCCI AEROBIC BOTTLE ONLY    Culture   Final    KLEBSIELLA PNEUMONIAE IN BOTH AEROBIC AND ANAEROBIC BOTTLES CRITICAL VALUE NOTED.  VALUE IS CONSISTENT WITH PREVIOUSLY REPORTED AND CALLED VALUE. ENTEROCOCCUS FAECALIS AEROBIC BOTTLE ONLY CRITICAL VALUE NOTED.  VALUE IS CONSISTENT WITH PREVIOUSLY REPORTED AND CALLED VALUE.    Report Status 12/29/2014 FINAL  Final   Organism ID, Bacteria KLEBSIELLA PNEUMONIAE  Final   Organism ID, Bacteria ENTEROCOCCUS FAECALIS  Final      Susceptibility   Klebsiella pneumoniae - MIC*  AMPICILLIN 16 RESISTANT Resistant     CEFTAZIDIME <=1 SENSITIVE Sensitive     CEFAZOLIN <=4 SENSITIVE Sensitive     CEFTRIAXONE <=1 SENSITIVE Sensitive     CIPROFLOXACIN <=0.25 SENSITIVE Sensitive     GENTAMICIN <=1 SENSITIVE Sensitive     IMIPENEM <=0.25 SENSITIVE Sensitive     TRIMETH/SULFA <=20 SENSITIVE Sensitive     CEFOXITIN <=4 SENSITIVE Sensitive     * KLEBSIELLA PNEUMONIAE   Enterococcus faecalis - MIC*    AMPICILLIN <=2 SENSITIVE Sensitive     CIPROFLOXACIN <=0.5 SENSITIVE Sensitive     LEVOFLOXACIN 1 SENSITIVE Sensitive     TETRACYCLINE >=16 RESISTANT Resistant     LINEZOLID 2 SENSITIVE Sensitive     * ENTEROCOCCUS FAECALIS  Influenza A&B Antigens [facility])     Status: None    Collection Time: 12/25/14 12:47 PM  Result Value Ref Range Status   Influenza A [facility]) NEGATIVE  Final   Influenza B [facility]) NEGATIVE  Final    Medical History: Past Medical History  Diagnosis Date  . ESRD (end stage renal disease)     a. on HD M,W,F; b. previously on [facility] list [facility] - removed 2016  . DM2 (diabetes mellitus, type 2)   . CAD (coronary artery disease)     a. cardiac cath 2014: LM nl, mLAD 30%, dLAD 30%, ostLCx 50%, mid ramus 20% OM3 80% pRCA 20%, mRCA 20%, PDA 100% w/ left to right collats, medically managed   . Stroke     Medications:  Scheduled:  . ciprofloxacin  400 mg Intravenous Q24H  . epoetin (EPOGEN/PROCRIT) injection  20,000 Units Subcutaneous Weekly  . heparin  5,000 Units Subcutaneous 3 times per day  . metoprolol tartrate  50 mg Oral Q6H  . pneumococcal 23 valent vaccine  0.5 mL Intramuscular Tomorrow-1000  . sevelamer carbonate  3,200 mg Oral TID WC   Assessment: Patient's a 64 y.o M with ESRD on HD MWF who presented to the ED with c/o weakness, cough, SOB, and AMS. CXR with suspicion for PNA.ID consult pending. Patient has been on Zosyn/Vancomycin. Bcx: Enterococcus faecalis, Klebsiella pneumonia: both sens to Cipro.  Plan:  Will order Cipro 400mg  IV Q24h for HD dosing.  Fatemah Pourciau A 12/31/2014,3:05 PM

## 2014-12-31 NOTE — Care Management Note (Signed)
Case Management Note  Patient Details  [name] MRN: [id number] Date of Birth: Mar 31, 1951  Subjective/Objective:                  Patient ambulated well around nursing station with PT , evaluation now home with home health. Spoke with spouse who was in the room who chose to go with [place]. Dr Bridgett Larsson informed by CSW Shela Leff of status change. Anticipate discharge this afternoon or tomorrow. Daughter stated that patient has rolling walker. No other needs anticipated. Referral will need to be called in by weekend NCM due to coverage per Pamala Hurry taylor at [place].   Action/Plan:  No Longer SNF appropriate, Home with Home Health Expected Discharge Date:                  Expected Discharge Plan:  [place]  In-House Referral:     Discharge planning Services  CM Consult  Post Acute Care Choice:  Durable Medical Equipment Choice offered to:  Adult Children  DME Arranged:  Walker rolling DME Agency:     HH Arranged:  PT [place] Agency:  [place]  Status of Service:     Medicare Important Message Given:  Yes Date Medicare IM Given:  12/27/14 Medicare IM give by:   Theodoro Kalata nCM) Date Additional Medicare IM Given:  12/31/14 Additional Medicare Important Message give by:  Theodoro Kalata  If discussed at Long Length of Stay Meetings, dates discussed:    Additional Comments:  Alvie Heidelberg, RN 12/31/2014, 4:31 PM

## 2015-01-01 LAB — GLUCOSE, CAPILLARY
GLUCOSE-CAPILLARY: 147 mg/dL — AB (ref 65–99)
Glucose-Capillary: 177 mg/dL — ABNORMAL HIGH (ref 65–99)

## 2015-01-01 LAB — HEMOGLOBIN: Hemoglobin: 7.3 g/dL — ABNORMAL LOW (ref 13.0–18.0)

## 2015-01-01 MED ORDER — METOPROLOL TARTRATE 25 MG PO TABS: 25.0000 mg | ORAL_TABLET | Freq: Two times a day (BID) | ORAL | Status: DC

## 2015-01-01 MED ORDER — ATORVASTATIN CALCIUM 10 MG PO TABS: 10.0000 mg | ORAL_TABLET | Freq: Every day | ORAL | Status: DC

## 2015-01-01 MED ORDER — CIPROFLOXACIN HCL 500 MG PO TABS: 500.0000 mg | ORAL_TABLET | Freq: Two times a day (BID) | ORAL | Status: DC

## 2015-01-01 MED ORDER — CIPROFLOXACIN HCL 500 MG PO TABS: 500.0000 mg | ORAL_TABLET | Freq: Every day | ORAL | Status: DC

## 2015-01-01 NOTE — Discharge Instructions (Signed)
*  Take all medications as prescribed.  *Notify MD for any difficulty breathing.  *Home Health services have been ordered for you. A representative from the agency will be in contact with you.  *Notify your doctor with any questions or concerns.

## 2015-01-01 NOTE — Progress Notes (Signed)
ANTIBIOTIC CONSULT NOTE - INITIAL  Pharmacy Consult for Cipro Indication: Bacteremia  No Known Allergies  Patient Measurements: Height: 5\' 8"  (172.7 cm) Weight: 188 lb 3.2 oz (85.367 kg) IBW/kg (Calculated) : 68.4 Adjusted Body Weight:    Vital Signs: Temp: 97.5 F (36.4 C) (05/14 0758) Temp Source: Oral (05/14 0758) BP: 117/63 mmHg (05/14 0758) Pulse Rate: 65 (05/14 0758) Intake/Output from previous day: 05/13 0701 - 05/14 0700 In: 321 [P.O.:240; IV Piggyback:81] Out: 1626 [Urine:125; Stool:1] Intake/Output from this shift: Total I/O In: 290 [P.O.:290] Out: 0   Labs:  Recent Labs  12/30/14 0435 12/31/14 0523 12/31/14 0524 01/01/15 0439  WBC 10.3  --  6.8  --   HGB 7.9*  --  7.4* 7.3*  PLT 197  --  264  --   CREATININE 7.11* 9.02*  --   --    Estimated Creatinine Clearance: 9.1 mL/min (by C-G formula based on Cr of 9.02). Patient on Hemodialysis   Microbiology: Recent Results (from the past 720 hour(s))  Blood Culture (routine x 2)     Status: None   Collection Time: 12/25/14  7:35 AM  Result Value Ref Range Status   Specimen Description BLOOD  Final   Special Requests NONE  Final   Culture  Setup Time   Final    GRAM NEGATIVE RODS IN BOTH AEROBIC AND ANAEROBIC BOTTLES GRAM POSITIVE COCCI AEROBIC BOTTLE ONLY    Culture   Final    KLEBSIELLA PNEUMONIAE IN BOTH AEROBIC AND ANAEROBIC BOTTLES ENTEROCOCCUS FAECALIS AEROBIC BOTTLE ONLY CRITICAL RESULT CALLED TO, READ BACK BY AND VERIFIED WITH: ERICA TAYLOR AT 8546 ON 12/26/14 RWW    Report Status 12/29/2014 FINAL  Final   Organism ID, Bacteria KLEBSIELLA PNEUMONIAE  Final   Organism ID, Bacteria ENTEROCOCCUS FAECALIS  Final      Susceptibility   Klebsiella pneumoniae - MIC*    AMPICILLIN 16 RESISTANT Resistant     CEFTAZIDIME <=1 SENSITIVE Sensitive     CEFAZOLIN <=4 SENSITIVE Sensitive     CEFTRIAXONE <=1 SENSITIVE Sensitive     CIPROFLOXACIN <=0.25 SENSITIVE Sensitive     GENTAMICIN <=1 SENSITIVE  Sensitive     IMIPENEM <=0.25 SENSITIVE Sensitive     TRIMETH/SULFA <=20 SENSITIVE Sensitive     CEFOXITIN <=4 SENSITIVE Sensitive     * KLEBSIELLA PNEUMONIAE   Enterococcus faecalis - MIC*    AMPICILLIN <=2 SENSITIVE Sensitive     CIPROFLOXACIN <=0.5 SENSITIVE Sensitive     LEVOFLOXACIN 1 SENSITIVE Sensitive     TETRACYCLINE >=16 RESISTANT Resistant     LINEZOLID 2 SENSITIVE Sensitive     * ENTEROCOCCUS FAECALIS  Blood Culture (routine x 2)     Status: None   Collection Time: 12/25/14  7:35 AM  Result Value Ref Range Status   Specimen Description BLOOD  Final   Special Requests NONE  Final   Culture  Setup Time   Final    GRAM NEGATIVE RODS IN BOTH AEROBIC AND ANAEROBIC BOTTLES GRAM POSITIVE COCCI AEROBIC BOTTLE ONLY    Culture   Final    KLEBSIELLA PNEUMONIAE IN BOTH AEROBIC AND ANAEROBIC BOTTLES CRITICAL VALUE NOTED.  VALUE IS CONSISTENT WITH PREVIOUSLY REPORTED AND CALLED VALUE. ENTEROCOCCUS FAECALIS AEROBIC BOTTLE ONLY CRITICAL VALUE NOTED.  VALUE IS CONSISTENT WITH PREVIOUSLY REPORTED AND CALLED VALUE.    Report Status 12/29/2014 FINAL  Final   Organism ID, Bacteria KLEBSIELLA PNEUMONIAE  Final   Organism ID, Bacteria ENTEROCOCCUS FAECALIS  Final  Susceptibility   Klebsiella pneumoniae - MIC*    AMPICILLIN 16 RESISTANT Resistant     CEFTAZIDIME <=1 SENSITIVE Sensitive     CEFAZOLIN <=4 SENSITIVE Sensitive     CEFTRIAXONE <=1 SENSITIVE Sensitive     CIPROFLOXACIN <=0.25 SENSITIVE Sensitive     GENTAMICIN <=1 SENSITIVE Sensitive     IMIPENEM <=0.25 SENSITIVE Sensitive     TRIMETH/SULFA <=20 SENSITIVE Sensitive     CEFOXITIN <=4 SENSITIVE Sensitive     * KLEBSIELLA PNEUMONIAE   Enterococcus faecalis - MIC*    AMPICILLIN <=2 SENSITIVE Sensitive     CIPROFLOXACIN <=0.5 SENSITIVE Sensitive     LEVOFLOXACIN 1 SENSITIVE Sensitive     TETRACYCLINE >=16 RESISTANT Resistant     LINEZOLID 2 SENSITIVE Sensitive     * ENTEROCOCCUS FAECALIS  Influenza A&B Antigens [facility])      Status: None   Collection Time: 12/25/14 12:47 PM  Result Value Ref Range Status   Influenza A [facility]) NEGATIVE  Final   Influenza B [facility]) NEGATIVE  Final    Medical History: Past Medical History  Diagnosis Date  . ESRD (end stage renal disease)     a. on HD M,W,F; b. previously on transplant list at [place] - removed 2016  . DM2 (diabetes mellitus, type 2)   . CAD (coronary artery disease)     a. cardiac cath 2014: LM nl, mLAD 30%, dLAD 30%, ostLCx 50%, mid ramus 20% OM3 80% pRCA 20%, mRCA 20%, PDA 100% w/ left to right collats, medically managed   . Stroke   . Chronic systolic CHF (congestive heart failure)     a. echo 12/2014: EF 35-40%, mod diffuse HK, RWMA cannot be excluded, LV diastolic fxn parameters nl, mild MR, moderately dilated LA, mildly dilated RV internal cavity size, mild TR, PASP nl    Medications:  Scheduled:  . ciprofloxacin  400 mg Intravenous Q24H  . epoetin (EPOGEN/PROCRIT) injection  20,000 Units Subcutaneous Weekly  . heparin  5,000 Units Subcutaneous 3 times per day  . metoprolol tartrate  25 mg Oral BID  . pneumococcal 23 valent vaccine  0.5 mL Intramuscular Tomorrow-1000  . sevelamer carbonate  3,200 mg Oral TID WC   Assessment: Patient's a 64 y.o M with ESRD on HD MWF who presented to the ED with c/o weakness, cough, SOB, and AMS. CXR with suspicion for PNA.ID consult pending. Patient has been on Zosyn/Vancomycin. Bcx: Enterococcus faecalis, Klebsiella pneumonia: both sens to Cipro.  Plan:  Current orders for cipro 400mg  IV Q24H  Patient discharging today - plan for oral cipro 500mg  PO daily  Rexene Edison, PharmD  01/01/2015,12:19 PM

## 2015-01-01 NOTE — Discharge Summary (Signed)
If you experience worsening of your admission symptoms, develop shortness of breath, life threatening emergency, suicidal or homicidal thoughts you must seek medical attention immediately by calling 911 or calling your MD immediately  if symptoms less severe.  You Must read complete instructions/literature along with all the possible adverse reactions/side effects for all the Medicines you take and that have been prescribed to you. Take any new Medicines after you have completely understood and accpet all the possible adverse reactions/side effects.   Please note  You were cared for by a hospitalist during your hospital stay. If you have any questions about your discharge medications or the care you received while you were in the hospital after you are discharged, you can call the unit and asked to speak with the hospitalist on call if the hospitalist that took care of you is not available. Once you are discharged, your primary care physician will handle any further medical issues. Please note that NO REFILLS for any discharge medications will be authorized once you are discharged, as it is imperative that you return to your primary care physician (or establish a relationship with a primary care physician if you do not have one) for your aftercare needs so that they can reassess your need for medications and monitor your lab values.                                                                                                                                                                       Luis Walter, is a 64 y.o. male  DOB 1951/04/22  MRN [id number].  Admission date:  12/25/2014  Admitting Physician  Vaughan Basta, MD  Discharge Date:  01/01/2015   Primary MD  Miguel Aschoff, MD  Recommendations for primary care physician for things to follow:  Follow uop with DR.Fitzgerald in one week Monitor for fever  After stopping abx   Admission Diagnosis  Aspiration pneumonia,  unspecified aspiration pneumonia type [J69.0] Sepsis, due to unspecified organism [A41.9]   Discharge Diagnosis  Aspiration pneumonia, unspecified aspiration pneumonia type [J69.0] Sepsis, due to unspecified organism [A41.9]  Due to Klebsiella and enteroccocci  Active Problems:   Pneumonia   ESRD (end stage renal disease)   DM (diabetes mellitus)   CVA (cerebral infarction)   Chronic systolic CHF (congestive heart failure)   CAD (coronary artery disease)      Past Medical History  Diagnosis Date  . ESRD (end stage renal disease)     a. on HD M,W,F; b. previously on [facility] list at [facility] - removed 2016  . DM2 (diabetes mellitus, type 2)   . CAD (coronary artery disease)     a. cardiac cath 2014: LM nl, mLAD 30%, dLAD 30%, ostLCx 50%, mid ramus  20% OM3 80% pRCA 20%, mRCA 20%, PDA 100% w/ left to right collats, medically managed   . Stroke   . Chronic systolic CHF (congestive heart failure)     a. echo 12/2014: EF 35-40%, mod diffuse HK, RWMA cannot be excluded, LV diastolic fxn parameters nl, mild MR, moderately dilated LA, mildly dilated RV internal cavity size, mild TR, PASP nl    History reviewed. No pertinent past surgical history.     History of present illness and  Hospital Course:     Kindly see H&P for history of present illness and admission details, please review complete Labs, Consult reports and Test reports for all details in brief  HPI  from the history and physical done on the day of admission    Hospital Course;[name] is a 64 y.o. male with a known history of end-stage renal disease on hemodialysis Monday/Wednesday/Friday, history of diabetes medication controlled with diet, history of CVA on full dose aspirin, patient presents with complaints of weakness, cough, shortness of breath, and confusion, patient was in [place] ED for 2 days, complaining of nausea, vomiting, thought to be secondary to gastroenteritis, patient was discharged home, presented  with above complaints, the patient was septic with leukocytosis of 20,000, fever of 103, chest x-ray showing right lung basal haziness suspicious for aspiration pneumonia, patient was started on IV vancomycin and Zosyn in ED after blood cultures were obtained, blood pressure was on the lower side, received gentle hydration, hospitalist requested to admit the patient. BCX have been + for Klebsiella and enterococcus. Has not had fever since 5/7. WBC stable down to 6.8. Plan is to d/c home with cipro 500 mg bid for 10 days, WIll need to monitor after stopping abx to ensure he has not seeded his AVG although unlikely.,i discussed this with his wife.  2.ESRD;continue HD M,W,F 3.h/io CVA;continue ASA,seen by PY ,recommends home health PT, 4.DMII;diet controlled 5.tachycardia;/ectopy;HR 120 while in HD on 5/9;seen BY DR.Gollan,started on lopressor ,now on Lopressor 25 mg BID,EF 35 to 45%.,needs  Out pt follow up ,Consider 30 day event monitor as an outpatient (though given his lack of arrhythmia, could consider outpatient clinic follow-up first) 6.Transient hyperthyroid due to sepsis;follow up with PMD as out pt for rpt thyroid labs   Discharge Condition: *stable   Follow UP  Follow-up Information    Follow up with Ola Spurr, DAVID, MD In 1 week.   Specialty:  Infectious Diseases   Contact information:   [place] [address] [phone]      follow up with routine HD on Monday at [facility]   Discharge Instructions  and  Discharge Medications  Continue low sodium,renal diet  Discharge Instructions    Face-to-face encounter (required for Medicare/Medicaid patients)    Complete by:  As directed   I Jance Siek certify that this patient is under my care and that I, or a nurse practitioner or physician's assistant working with me, had a face-to-face encounter that meets the physician face-to-face encounter requirements with this patient on 01/01/2015. The encounter  with the patient was in whole, or in part for the following medical condition(s) which is the primary reason for home health care  Deconditioning Klebsiella sepsis ESRD on HD  The encounter with the patient was in whole, or in part, for the following medical condition, which is the primary reason for home health care:  ambulatory dysnfucntion  I certify that, based on my findings, the following services are medically necessary home health services:  Physical therapy  Reason for Medically Necessary Home Health Services:  Therapy- Therapeutic Exercises to Increase Strength and Endurance  My clinical findings support the need for the above services:  Unsafe ambulation due to balance issues  Further, I certify that my clinical findings support that this patient is homebound due to:  Unsafe ambulation due to balance issues     Home Health    Complete by:  As directed   To provide the following care/treatments:  PT            Medication List    TAKE these medications        aspirin 325 MG tablet  Take 325 mg by mouth daily.     ciprofloxacin 500 MG tablet  Commonly known as:  CIPRO  Take 1 tablet (500 mg total) by mouth 2 (two) times daily.     docusate sodium 100 MG capsule  Commonly known as:  COLACE  Take 1 capsule (100 mg total) by mouth 2 (two) times daily.     sevelamer 800 MG tablet  Commonly known as:  RENAGEL  Take 3,200 mg by mouth 3 (three) times daily with meals.          Diet and Activity recommendation: See Discharge Instructions above   Consults obtained -cardio nephro ID   Major procedures and Radiology Reports - PLEASE review detailed and final reports for all details, in brief -    Ct Head Wo Contrast  12/29/2014   CLINICAL DATA:  There is concern that patient is very sleepy and lethargic on. Normally he is very functional and he works as well.  EXAM: CT HEAD WITHOUT CONTRAST  TECHNIQUE: Contiguous axial images were obtained from the base of the skull  through the vertex without intravenous contrast.  COMPARISON:  05/30/2013  FINDINGS: The ventricles are normal in configuration. There is ventricular enlargement, greater than sulcal enlargement, stable from the prior studies, consistent with mild to moderate atrophy. No hydrocephalus.  There is a larger area of encephalomalacia throughout much of the right middle cerebral artery territory reflecting an old infarct. There is a smaller area of posterior left frontal and adjacent left parietal lobe encephalomalacia reflecting a second area of chronic infarction. There is some encephalomalacia in the posterior medial right occipital parietal lobe reflecting an area of old right PCA distribution infarction. There are no parenchymal masses or mass effect. There is no evidence of a recent transcortical infarct.  There are no extra-axial masses or abnormal fluid collections.  There is no intracranial hemorrhage.  Visualized sinuses and mastoid air cells are clear.  IMPRESSION: 1. No acute intracranial abnormalities. 2. Atrophy and multiple old infarcts, unchanged from the prior imaging.   Electronically Signed   By: Lajean Manes M.D.   On: 12/29/2014 16:10   Nm Hepatobiliary Liver Func  12/27/2014   CLINICAL DATA:  Elevated bilirubin.  EXAM: NUCLEAR MEDICINE HEPATOBILIARY IMAGING  TECHNIQUE: Sequential images of the abdomen were obtained out to 60 minutes following intravenous administration of radiopharmaceutical.  RADIOPHARMACEUTICALS:  7.18 mCi Tc-86m Choletec IV  COMPARISON:  Ultrasound of Dec 25, 2014.  FINDINGS: Uptake within hepatic parenchyma is noted. Filling of small bowel is noted. However, no uptake of filling of gallbladder is seen 90 minutes after radiotracer administration. This is concerning for cystic duct obstruction and possible cholecystitis.  IMPRESSION: No uptake within gallbladder is seen up to 90 minutes after radiotracer administration, concerning for cystic duct obstruction and possible  cholecystitis.   Electronically Signed  By: Marijo Conception, M.D.   On: 12/27/2014 16:24   Dg Chest Port 1 View  12/25/2014   CLINICAL DATA:  Two normal 80s. Fever. Nausea vomiting for 1 week. Code sepsis.  EXAM: PORTABLE CHEST - 1 VIEW  COMPARISON:  05/30/2013  FINDINGS: Lung volumes are relatively low. There is hazy opacity at the right lung base which is likely atelectasis. Pneumonia is possible. Lungs otherwise clear. No pleural effusion or pneumothorax.  Cardiac silhouette normal in size and configuration. No mediastinal or hilar masses.  Bony thorax is demineralized but grossly intact.  IMPRESSION: 1. Area of hazy opacity at the right lung base, most likely atelectasis. Pneumonia is possible. 2. No other evidence of acute cardiopulmonary disease.   Electronically Signed   By: Lajean Manes M.D.   On: 12/25/2014 08:31   US Abdomen Limited Ruq  12/25/2014   CLINICAL DATA:  Elevated bilirubin.  Cholecystitis.  EXAM: US ABDOMEN LIMITED - RIGHT UPPER QUADRANT  COMPARISON:  None.  FINDINGS: Gallbladder:  Cholelithiasis and biliary sludge is present. The largest stone measures 7 mm. Some of the stones appear non mobile despite repositioning. The gallbladder wall is striated and measures 6 mm which is thickened. No sonographic Percell Miller sign is present.  Common bile duct:  Diameter: 6 mm, within normal limits for age.  Liver:  No focal lesion identified. Within normal limits in parenchymal echogenicity.  RIGHT pleural effusion incidentally noted.  IMPRESSION: Biliary sludge in stones with nonspecific gallbladder wall thickening. In the absence of a sonographic Murphy sign, the significance is unclear. The sonographic findings are compatible with cholecystitis which may be chronic. In a patient with elevated bilirubin, consider HIDA scan for further assessment.   Electronically Signed   By: Dereck Ligas M.D.   On: 12/25/2014 15:37    Micro Results    Recent Results (from the past 240 hour(s))  Blood Culture  (routine x 2)     Status: None   Collection Time: 12/25/14  7:35 AM  Result Value Ref Range Status   Specimen Description BLOOD  Final   Special Requests NONE  Final   Culture  Setup Time   Final    GRAM NEGATIVE RODS IN BOTH AEROBIC AND ANAEROBIC BOTTLES GRAM POSITIVE COCCI AEROBIC BOTTLE ONLY    Culture   Final    KLEBSIELLA PNEUMONIAE IN BOTH AEROBIC AND ANAEROBIC BOTTLES ENTEROCOCCUS FAECALIS AEROBIC BOTTLE ONLY CRITICAL RESULT CALLED TO, READ BACK BY AND VERIFIED WITH: ERICA TAYLOR AT 8099 ON 12/26/14 RWW    Report Status 12/29/2014 FINAL  Final   Organism ID, Bacteria KLEBSIELLA PNEUMONIAE  Final   Organism ID, Bacteria ENTEROCOCCUS FAECALIS  Final      Susceptibility   Klebsiella pneumoniae - MIC*    AMPICILLIN 16 RESISTANT Resistant     CEFTAZIDIME <=1 SENSITIVE Sensitive     CEFAZOLIN <=4 SENSITIVE Sensitive     CEFTRIAXONE <=1 SENSITIVE Sensitive     CIPROFLOXACIN <=0.25 SENSITIVE Sensitive     GENTAMICIN <=1 SENSITIVE Sensitive     IMIPENEM <=0.25 SENSITIVE Sensitive     TRIMETH/SULFA <=20 SENSITIVE Sensitive     CEFOXITIN <=4 SENSITIVE Sensitive     * KLEBSIELLA PNEUMONIAE   Enterococcus faecalis - MIC*    AMPICILLIN <=2 SENSITIVE Sensitive     CIPROFLOXACIN <=0.5 SENSITIVE Sensitive     LEVOFLOXACIN 1 SENSITIVE Sensitive     TETRACYCLINE >=16 RESISTANT Resistant     LINEZOLID 2 SENSITIVE Sensitive     * ENTEROCOCCUS FAECALIS  Blood Culture (routine x 2)     Status: None   Collection Time: 12/25/14  7:35 AM  Result Value Ref Range Status   Specimen Description BLOOD  Final   Special Requests NONE  Final   Culture  Setup Time   Final    GRAM NEGATIVE RODS IN BOTH AEROBIC AND ANAEROBIC BOTTLES GRAM POSITIVE COCCI AEROBIC BOTTLE ONLY    Culture   Final    KLEBSIELLA PNEUMONIAE IN BOTH AEROBIC AND ANAEROBIC BOTTLES CRITICAL VALUE NOTED.  VALUE IS CONSISTENT WITH PREVIOUSLY REPORTED AND CALLED VALUE. ENTEROCOCCUS FAECALIS AEROBIC BOTTLE ONLY CRITICAL VALUE  NOTED.  VALUE IS CONSISTENT WITH PREVIOUSLY REPORTED AND CALLED VALUE.    Report Status 12/29/2014 FINAL  Final   Organism ID, Bacteria KLEBSIELLA PNEUMONIAE  Final   Organism ID, Bacteria ENTEROCOCCUS FAECALIS  Final      Susceptibility   Klebsiella pneumoniae - MIC*    AMPICILLIN 16 RESISTANT Resistant     CEFTAZIDIME <=1 SENSITIVE Sensitive     CEFAZOLIN <=4 SENSITIVE Sensitive     CEFTRIAXONE <=1 SENSITIVE Sensitive     CIPROFLOXACIN <=0.25 SENSITIVE Sensitive     GENTAMICIN <=1 SENSITIVE Sensitive     IMIPENEM <=0.25 SENSITIVE Sensitive     TRIMETH/SULFA <=20 SENSITIVE Sensitive     CEFOXITIN <=4 SENSITIVE Sensitive     * KLEBSIELLA PNEUMONIAE   Enterococcus faecalis - MIC*    AMPICILLIN <=2 SENSITIVE Sensitive     CIPROFLOXACIN <=0.5 SENSITIVE Sensitive     LEVOFLOXACIN 1 SENSITIVE Sensitive     TETRACYCLINE >=16 RESISTANT Resistant     LINEZOLID 2 SENSITIVE Sensitive     * ENTEROCOCCUS FAECALIS  Influenza A&B Antigens [facility])     Status: None   Collection Time: 12/25/14 12:47 PM  Result Value Ref Range Status   Influenza A ([facility]) NEGATIVE  Final   Influenza B ([facility]) NEGATIVE  Final       Today   Subjective:   Bethann Punches today has no headache,no chest abdominal pain,no new weakness tingling or numbness, feels much better wants to go home today.  Objective:   Blood pressure 117/63, pulse 65, temperature 97.5 F (36.4 C), temperature source Oral, resp. rate 18, height 5\' 8"  (1.727 m), weight 85.367 kg (188 lb 3.2 oz), SpO2 100 %.   Intake/Output Summary (Last 24 hours) at 01/01/15 0958 Last data filed at 12/31/14 2321  Gross per 24 hour  Intake    240 ml  Output   1625 ml  Net  -1385 ml    Exam Awake Alert, Oriented x 3, No new F.N deficits, Normal affect [place].AT,PERRAL Supple Neck,No JVD, No cervical lymphadenopathy appriciated.  Symmetrical Chest wall movement, Good air movement bilaterally, CTAB RRR,No Gallops,Rubs or new Murmurs, No Parasternal  Heave +ve B.Sounds, Abd Soft, Non tender, No organomegaly appriciated, No rebound -guarding or rigidity. No Cyanosis, Clubbing or edema, No new Rash or bruise  Data Review   CBC w Diff: Lab Results  Component Value Date   WBC 6.8 12/31/2014   WBC 7.2 05/30/2013   HGB 7.3* 01/01/2015   HGB 12.7* 05/30/2013   HCT 22.3* 12/31/2014   HCT 37.3* 05/30/2013   PLT 264 12/31/2014   PLT 217 05/30/2013   LYMPHOPCT 2 12/25/2014   MONOPCT 6 12/25/2014   EOSPCT 0 12/25/2014   BASOPCT 0 12/25/2014    CMP: Lab Results  Component Value Date   NA 135 12/31/2014   NA 137 05/31/2013   K 3.9 12/31/2014  K 3.8 05/31/2013   CL 93* 12/31/2014   CL 99 05/31/2013   CO2 30 12/31/2014   CO2 30 05/31/2013   BUN 57* 12/31/2014   BUN 37* 05/31/2013   CREATININE 9.02* 12/31/2014   CREATININE 11.32* 05/31/2013   PROT 5.8* 12/30/2014   PROT 7.7 05/30/2013   ALBUMIN 1.9* 12/31/2014   ALBUMIN 3.4 05/30/2013   BILITOT 3.0* 12/30/2014   ALKPHOS 61 12/30/2014   ALKPHOS 104 05/30/2013   AST 25 12/30/2014   AST 34 05/30/2013   ALT 31 12/30/2014   ALT 14 05/30/2013  .   Total Time in preparing paper work, data evaluation and todays exam - 15 minutes  Hamsa Laurich M.D on 01/01/2015 at 9:58 AM

## 2015-01-01 NOTE — Progress Notes (Signed)
Referral for home health PT called and faxed to [facility]. Luis Walter has a rolling walker at home.

## 2015-01-02 DIAGNOSIS — J150 Pneumonia due to Klebsiella pneumoniae: Secondary | ICD-10-CM | POA: Diagnosis not present

## 2015-01-02 DIAGNOSIS — E119 Type 2 diabetes mellitus without complications: Secondary | ICD-10-CM | POA: Diagnosis not present

## 2015-01-02 DIAGNOSIS — I509 Heart failure, unspecified: Secondary | ICD-10-CM | POA: Diagnosis not present

## 2015-01-02 DIAGNOSIS — Z8673 Personal history of transient ischemic attack (TIA), and cerebral infarction without residual deficits: Secondary | ICD-10-CM | POA: Diagnosis not present

## 2015-01-02 DIAGNOSIS — R2681 Unsteadiness on feet: Secondary | ICD-10-CM | POA: Diagnosis not present

## 2015-01-02 DIAGNOSIS — N186 End stage renal disease: Secondary | ICD-10-CM | POA: Diagnosis not present

## 2015-01-02 DIAGNOSIS — I2510 Atherosclerotic heart disease of native coronary artery without angina pectoris: Secondary | ICD-10-CM | POA: Diagnosis not present

## 2015-01-03 DIAGNOSIS — D631 Anemia in chronic kidney disease: Secondary | ICD-10-CM | POA: Diagnosis not present

## 2015-01-03 DIAGNOSIS — N2581 Secondary hyperparathyroidism of renal origin: Secondary | ICD-10-CM | POA: Diagnosis not present

## 2015-01-03 DIAGNOSIS — E1129 Type 2 diabetes mellitus with other diabetic kidney complication: Secondary | ICD-10-CM | POA: Diagnosis not present

## 2015-01-03 DIAGNOSIS — R509 Fever, unspecified: Secondary | ICD-10-CM | POA: Diagnosis not present

## 2015-01-03 DIAGNOSIS — N186 End stage renal disease: Secondary | ICD-10-CM | POA: Diagnosis not present

## 2015-01-03 DIAGNOSIS — D509 Iron deficiency anemia, unspecified: Secondary | ICD-10-CM | POA: Diagnosis not present

## 2015-01-04 DIAGNOSIS — J150 Pneumonia due to Klebsiella pneumoniae: Secondary | ICD-10-CM | POA: Diagnosis not present

## 2015-01-04 DIAGNOSIS — R2681 Unsteadiness on feet: Secondary | ICD-10-CM | POA: Diagnosis not present

## 2015-01-04 DIAGNOSIS — N186 End stage renal disease: Secondary | ICD-10-CM | POA: Diagnosis not present

## 2015-01-04 DIAGNOSIS — I2510 Atherosclerotic heart disease of native coronary artery without angina pectoris: Secondary | ICD-10-CM | POA: Diagnosis not present

## 2015-01-04 DIAGNOSIS — E119 Type 2 diabetes mellitus without complications: Secondary | ICD-10-CM | POA: Diagnosis not present

## 2015-01-04 DIAGNOSIS — I509 Heart failure, unspecified: Secondary | ICD-10-CM | POA: Diagnosis not present

## 2015-01-04 LAB — HCV COMMENT:

## 2015-01-05 DIAGNOSIS — D631 Anemia in chronic kidney disease: Secondary | ICD-10-CM | POA: Diagnosis not present

## 2015-01-05 DIAGNOSIS — D509 Iron deficiency anemia, unspecified: Secondary | ICD-10-CM | POA: Diagnosis not present

## 2015-01-05 DIAGNOSIS — E1129 Type 2 diabetes mellitus with other diabetic kidney complication: Secondary | ICD-10-CM | POA: Diagnosis not present

## 2015-01-05 DIAGNOSIS — N2581 Secondary hyperparathyroidism of renal origin: Secondary | ICD-10-CM | POA: Diagnosis not present

## 2015-01-05 DIAGNOSIS — R509 Fever, unspecified: Secondary | ICD-10-CM | POA: Diagnosis not present

## 2015-01-05 DIAGNOSIS — N186 End stage renal disease: Secondary | ICD-10-CM | POA: Diagnosis not present

## 2015-01-06 DIAGNOSIS — I509 Heart failure, unspecified: Secondary | ICD-10-CM | POA: Diagnosis not present

## 2015-01-06 DIAGNOSIS — N186 End stage renal disease: Secondary | ICD-10-CM | POA: Diagnosis not present

## 2015-01-06 DIAGNOSIS — E119 Type 2 diabetes mellitus without complications: Secondary | ICD-10-CM | POA: Diagnosis not present

## 2015-01-06 DIAGNOSIS — I2510 Atherosclerotic heart disease of native coronary artery without angina pectoris: Secondary | ICD-10-CM | POA: Diagnosis not present

## 2015-01-06 DIAGNOSIS — R2681 Unsteadiness on feet: Secondary | ICD-10-CM | POA: Diagnosis not present

## 2015-01-06 DIAGNOSIS — J150 Pneumonia due to Klebsiella pneumoniae: Secondary | ICD-10-CM | POA: Diagnosis not present

## 2015-01-07 DIAGNOSIS — R509 Fever, unspecified: Secondary | ICD-10-CM | POA: Diagnosis not present

## 2015-01-07 DIAGNOSIS — E1129 Type 2 diabetes mellitus with other diabetic kidney complication: Secondary | ICD-10-CM | POA: Diagnosis not present

## 2015-01-07 DIAGNOSIS — D509 Iron deficiency anemia, unspecified: Secondary | ICD-10-CM | POA: Diagnosis not present

## 2015-01-07 DIAGNOSIS — N2581 Secondary hyperparathyroidism of renal origin: Secondary | ICD-10-CM | POA: Diagnosis not present

## 2015-01-07 DIAGNOSIS — N186 End stage renal disease: Secondary | ICD-10-CM | POA: Diagnosis not present

## 2015-01-07 DIAGNOSIS — D631 Anemia in chronic kidney disease: Secondary | ICD-10-CM | POA: Diagnosis not present

## 2015-01-10 DIAGNOSIS — R509 Fever, unspecified: Secondary | ICD-10-CM | POA: Diagnosis not present

## 2015-01-10 DIAGNOSIS — N2581 Secondary hyperparathyroidism of renal origin: Secondary | ICD-10-CM | POA: Diagnosis not present

## 2015-01-10 DIAGNOSIS — D631 Anemia in chronic kidney disease: Secondary | ICD-10-CM | POA: Diagnosis not present

## 2015-01-10 DIAGNOSIS — D509 Iron deficiency anemia, unspecified: Secondary | ICD-10-CM | POA: Diagnosis not present

## 2015-01-10 DIAGNOSIS — N186 End stage renal disease: Secondary | ICD-10-CM | POA: Diagnosis not present

## 2015-01-10 DIAGNOSIS — E1129 Type 2 diabetes mellitus with other diabetic kidney complication: Secondary | ICD-10-CM | POA: Diagnosis not present

## 2015-01-11 DIAGNOSIS — E119 Type 2 diabetes mellitus without complications: Secondary | ICD-10-CM | POA: Diagnosis not present

## 2015-01-11 DIAGNOSIS — J150 Pneumonia due to Klebsiella pneumoniae: Secondary | ICD-10-CM | POA: Diagnosis not present

## 2015-01-11 DIAGNOSIS — I2510 Atherosclerotic heart disease of native coronary artery without angina pectoris: Secondary | ICD-10-CM | POA: Diagnosis not present

## 2015-01-11 DIAGNOSIS — R2681 Unsteadiness on feet: Secondary | ICD-10-CM | POA: Diagnosis not present

## 2015-01-11 DIAGNOSIS — N186 End stage renal disease: Secondary | ICD-10-CM | POA: Diagnosis not present

## 2015-01-11 DIAGNOSIS — I509 Heart failure, unspecified: Secondary | ICD-10-CM | POA: Diagnosis not present

## 2015-01-12 DIAGNOSIS — D509 Iron deficiency anemia, unspecified: Secondary | ICD-10-CM | POA: Diagnosis not present

## 2015-01-12 DIAGNOSIS — N186 End stage renal disease: Secondary | ICD-10-CM | POA: Diagnosis not present

## 2015-01-12 DIAGNOSIS — N2581 Secondary hyperparathyroidism of renal origin: Secondary | ICD-10-CM | POA: Diagnosis not present

## 2015-01-12 DIAGNOSIS — D631 Anemia in chronic kidney disease: Secondary | ICD-10-CM | POA: Diagnosis not present

## 2015-01-12 DIAGNOSIS — E1129 Type 2 diabetes mellitus with other diabetic kidney complication: Secondary | ICD-10-CM | POA: Diagnosis not present

## 2015-01-12 DIAGNOSIS — R509 Fever, unspecified: Secondary | ICD-10-CM | POA: Diagnosis not present

## 2015-01-13 DIAGNOSIS — E119 Type 2 diabetes mellitus without complications: Secondary | ICD-10-CM | POA: Diagnosis not present

## 2015-01-13 DIAGNOSIS — R2681 Unsteadiness on feet: Secondary | ICD-10-CM | POA: Diagnosis not present

## 2015-01-13 DIAGNOSIS — N186 End stage renal disease: Secondary | ICD-10-CM | POA: Diagnosis not present

## 2015-01-13 DIAGNOSIS — I509 Heart failure, unspecified: Secondary | ICD-10-CM | POA: Diagnosis not present

## 2015-01-13 DIAGNOSIS — I2510 Atherosclerotic heart disease of native coronary artery without angina pectoris: Secondary | ICD-10-CM | POA: Diagnosis not present

## 2015-01-13 DIAGNOSIS — J150 Pneumonia due to Klebsiella pneumoniae: Secondary | ICD-10-CM | POA: Diagnosis not present

## 2015-01-14 DIAGNOSIS — R509 Fever, unspecified: Secondary | ICD-10-CM | POA: Diagnosis not present

## 2015-01-14 DIAGNOSIS — D631 Anemia in chronic kidney disease: Secondary | ICD-10-CM | POA: Diagnosis not present

## 2015-01-14 DIAGNOSIS — D509 Iron deficiency anemia, unspecified: Secondary | ICD-10-CM | POA: Diagnosis not present

## 2015-01-14 DIAGNOSIS — E1129 Type 2 diabetes mellitus with other diabetic kidney complication: Secondary | ICD-10-CM | POA: Diagnosis not present

## 2015-01-14 DIAGNOSIS — N2581 Secondary hyperparathyroidism of renal origin: Secondary | ICD-10-CM | POA: Diagnosis not present

## 2015-01-14 DIAGNOSIS — N186 End stage renal disease: Secondary | ICD-10-CM | POA: Diagnosis not present

## 2015-01-17 DIAGNOSIS — N2581 Secondary hyperparathyroidism of renal origin: Secondary | ICD-10-CM | POA: Diagnosis not present

## 2015-01-17 DIAGNOSIS — R509 Fever, unspecified: Secondary | ICD-10-CM | POA: Diagnosis not present

## 2015-01-17 DIAGNOSIS — N186 End stage renal disease: Secondary | ICD-10-CM | POA: Diagnosis not present

## 2015-01-17 DIAGNOSIS — E1129 Type 2 diabetes mellitus with other diabetic kidney complication: Secondary | ICD-10-CM | POA: Diagnosis not present

## 2015-01-17 DIAGNOSIS — D509 Iron deficiency anemia, unspecified: Secondary | ICD-10-CM | POA: Diagnosis not present

## 2015-01-17 DIAGNOSIS — D631 Anemia in chronic kidney disease: Secondary | ICD-10-CM | POA: Diagnosis not present

## 2015-01-18 DIAGNOSIS — N186 End stage renal disease: Secondary | ICD-10-CM | POA: Diagnosis not present

## 2015-01-18 DIAGNOSIS — Z992 Dependence on renal dialysis: Secondary | ICD-10-CM | POA: Diagnosis not present

## 2015-01-18 DIAGNOSIS — E119 Type 2 diabetes mellitus without complications: Secondary | ICD-10-CM | POA: Diagnosis not present

## 2015-01-18 DIAGNOSIS — R2681 Unsteadiness on feet: Secondary | ICD-10-CM | POA: Diagnosis not present

## 2015-01-18 DIAGNOSIS — J150 Pneumonia due to Klebsiella pneumoniae: Secondary | ICD-10-CM | POA: Diagnosis not present

## 2015-01-18 DIAGNOSIS — E1122 Type 2 diabetes mellitus with diabetic chronic kidney disease: Secondary | ICD-10-CM | POA: Diagnosis not present

## 2015-01-18 DIAGNOSIS — I509 Heart failure, unspecified: Secondary | ICD-10-CM | POA: Diagnosis not present

## 2015-01-18 DIAGNOSIS — I2510 Atherosclerotic heart disease of native coronary artery without angina pectoris: Secondary | ICD-10-CM | POA: Diagnosis not present

## 2015-01-19 DIAGNOSIS — N186 End stage renal disease: Secondary | ICD-10-CM | POA: Diagnosis not present

## 2015-01-19 DIAGNOSIS — D509 Iron deficiency anemia, unspecified: Secondary | ICD-10-CM | POA: Diagnosis not present

## 2015-01-19 DIAGNOSIS — E1129 Type 2 diabetes mellitus with other diabetic kidney complication: Secondary | ICD-10-CM | POA: Diagnosis not present

## 2015-01-19 DIAGNOSIS — N2581 Secondary hyperparathyroidism of renal origin: Secondary | ICD-10-CM | POA: Diagnosis not present

## 2015-01-19 DIAGNOSIS — D631 Anemia in chronic kidney disease: Secondary | ICD-10-CM | POA: Diagnosis not present

## 2015-01-20 DIAGNOSIS — I509 Heart failure, unspecified: Secondary | ICD-10-CM | POA: Diagnosis not present

## 2015-01-20 DIAGNOSIS — N186 End stage renal disease: Secondary | ICD-10-CM | POA: Diagnosis not present

## 2015-01-20 DIAGNOSIS — E119 Type 2 diabetes mellitus without complications: Secondary | ICD-10-CM | POA: Diagnosis not present

## 2015-01-20 DIAGNOSIS — R2681 Unsteadiness on feet: Secondary | ICD-10-CM | POA: Diagnosis not present

## 2015-01-20 DIAGNOSIS — J150 Pneumonia due to Klebsiella pneumoniae: Secondary | ICD-10-CM | POA: Diagnosis not present

## 2015-01-20 DIAGNOSIS — I2510 Atherosclerotic heart disease of native coronary artery without angina pectoris: Secondary | ICD-10-CM | POA: Diagnosis not present

## 2015-01-21 DIAGNOSIS — D631 Anemia in chronic kidney disease: Secondary | ICD-10-CM | POA: Diagnosis not present

## 2015-01-21 DIAGNOSIS — E1129 Type 2 diabetes mellitus with other diabetic kidney complication: Secondary | ICD-10-CM | POA: Diagnosis not present

## 2015-01-21 DIAGNOSIS — D509 Iron deficiency anemia, unspecified: Secondary | ICD-10-CM | POA: Diagnosis not present

## 2015-01-21 DIAGNOSIS — N2581 Secondary hyperparathyroidism of renal origin: Secondary | ICD-10-CM | POA: Diagnosis not present

## 2015-01-21 DIAGNOSIS — N186 End stage renal disease: Secondary | ICD-10-CM | POA: Diagnosis not present

## 2015-01-23 ENCOUNTER — Inpatient Hospital Stay
Admission: EM | Admit: 2015-01-23 | Discharge: 2015-01-26 | DRG: 371 | Disposition: A | Discharge status: Home / Self Care | Payer: Medicare Other | Attending: Internal Medicine | Admitting: Internal Medicine

## 2015-01-23 ENCOUNTER — Encounter: Payer: Self-pay | Admitting: Emergency Medicine

## 2015-01-23 DIAGNOSIS — I120 Hypertensive chronic kidney disease with stage 5 chronic kidney disease or end stage renal disease: Secondary | ICD-10-CM | POA: Diagnosis present

## 2015-01-23 DIAGNOSIS — E876 Hypokalemia: Secondary | ICD-10-CM | POA: Diagnosis not present

## 2015-01-23 DIAGNOSIS — Z833 Family history of diabetes mellitus: Secondary | ICD-10-CM

## 2015-01-23 DIAGNOSIS — N186 End stage renal disease: Secondary | ICD-10-CM | POA: Diagnosis not present

## 2015-01-23 DIAGNOSIS — Z992 Dependence on renal dialysis: Secondary | ICD-10-CM

## 2015-01-23 DIAGNOSIS — E119 Type 2 diabetes mellitus without complications: Secondary | ICD-10-CM | POA: Diagnosis present

## 2015-01-23 DIAGNOSIS — Z7982 Long term (current) use of aspirin: Secondary | ICD-10-CM

## 2015-01-23 DIAGNOSIS — Z87891 Personal history of nicotine dependence: Secondary | ICD-10-CM | POA: Diagnosis not present

## 2015-01-23 DIAGNOSIS — R112 Nausea with vomiting, unspecified: Secondary | ICD-10-CM | POA: Diagnosis present

## 2015-01-23 DIAGNOSIS — R778 Other specified abnormalities of plasma proteins: Secondary | ICD-10-CM | POA: Diagnosis present

## 2015-01-23 DIAGNOSIS — N2581 Secondary hyperparathyroidism of renal origin: Secondary | ICD-10-CM | POA: Diagnosis not present

## 2015-01-23 DIAGNOSIS — Z8249 Family history of ischemic heart disease and other diseases of the circulatory system: Secondary | ICD-10-CM

## 2015-01-23 DIAGNOSIS — R7989 Other specified abnormal findings of blood chemistry: Secondary | ICD-10-CM

## 2015-01-23 DIAGNOSIS — R197 Diarrhea, unspecified: Secondary | ICD-10-CM | POA: Diagnosis not present

## 2015-01-23 DIAGNOSIS — D631 Anemia in chronic kidney disease: Secondary | ICD-10-CM | POA: Diagnosis present

## 2015-01-23 DIAGNOSIS — Z8673 Personal history of transient ischemic attack (TIA), and cerebral infarction without residual deficits: Secondary | ICD-10-CM

## 2015-01-23 DIAGNOSIS — I5022 Chronic systolic (congestive) heart failure: Secondary | ICD-10-CM | POA: Diagnosis not present

## 2015-01-23 DIAGNOSIS — I2510 Atherosclerotic heart disease of native coronary artery without angina pectoris: Secondary | ICD-10-CM | POA: Diagnosis not present

## 2015-01-23 DIAGNOSIS — A047 Enterocolitis due to Clostridium difficile: Principal | ICD-10-CM | POA: Diagnosis present

## 2015-01-23 DIAGNOSIS — I10 Essential (primary) hypertension: Secondary | ICD-10-CM | POA: Diagnosis not present

## 2015-01-23 DIAGNOSIS — Z79899 Other long term (current) drug therapy: Secondary | ICD-10-CM

## 2015-01-23 DIAGNOSIS — A0472 Enterocolitis due to Clostridium difficile, not specified as recurrent: Secondary | ICD-10-CM | POA: Diagnosis present

## 2015-01-23 DIAGNOSIS — R748 Abnormal levels of other serum enzymes: Secondary | ICD-10-CM | POA: Diagnosis present

## 2015-01-23 HISTORY — DX: Calculus of gallbladder without cholecystitis without obstruction: K80.20

## 2015-01-23 LAB — BASIC METABOLIC PANEL
Anion gap: 11 (ref 5–15)
BUN: 25 mg/dL — ABNORMAL HIGH (ref 6–20)
CALCIUM: 8.7 mg/dL — AB (ref 8.9–10.3)
CO2: 32 mmol/L (ref 22–32)
Chloride: 94 mmol/L — ABNORMAL LOW (ref 101–111)
Creatinine, Ser: 9.77 mg/dL — ABNORMAL HIGH (ref 0.61–1.24)
GFR calc Af Amer: 6 mL/min — ABNORMAL LOW (ref >60)
GFR, EST NON AFRICAN AMERICAN: 5 mL/min — AB (ref >60)
GLUCOSE: 139 mg/dL — AB (ref 65–99)
POTASSIUM: 3.1 mmol/L — AB (ref 3.5–5.1)
Sodium: 137 mmol/L (ref 135–145)

## 2015-01-23 LAB — CBC
HCT: 24.9 % — ABNORMAL LOW (ref 40.0–52.0)
Hemoglobin: 7.8 g/dL — ABNORMAL LOW (ref 13.0–18.0)
MCH: 27.2 pg (ref 26.0–34.0)
MCHC: 31.6 g/dL — AB (ref 32.0–36.0)
MCV: 86.3 fL (ref 80.0–100.0)
PLATELETS: 152 10*3/uL (ref 150–440)
RBC: 2.88 MIL/uL — AB (ref 4.40–5.90)
RDW: 18.6 % — ABNORMAL HIGH (ref 11.5–14.5)
WBC: 5.2 10*3/uL (ref 3.8–10.6)

## 2015-01-23 LAB — TROPONIN I: Troponin I: 0.17 ng/mL — ABNORMAL HIGH (ref <0.031)

## 2015-01-23 LAB — C DIFFICILE QUICK SCREEN W PCR REFLEX
C DIFFICILE (CDIFF) INTERP: POSITIVE
C DIFFICLE (CDIFF) ANTIGEN: POSITIVE
C Diff toxin: POSITIVE

## 2015-01-23 MED ORDER — SODIUM CHLORIDE 0.9 % IV SOLN
INTRAVENOUS | Status: DC
  Administered 2015-01-24 – 2015-01-26 (×3): via INTRAVENOUS

## 2015-01-23 MED ORDER — METRONIDAZOLE 500 MG PO TABS
ORAL_TABLET | ORAL | Status: AC
  Administered 2015-01-24: 500 mg via ORAL
  Filled 2015-01-23: qty 1

## 2015-01-23 MED ORDER — ONDANSETRON HCL 4 MG/2ML IJ SOLN: 4.0000 mg | Freq: Four times a day (QID) | INTRAMUSCULAR | Status: DC | PRN

## 2015-01-23 MED ORDER — HEPARIN SODIUM (PORCINE) 5000 UNIT/ML IJ SOLN
INTRAMUSCULAR | Status: AC
  Administered 2015-01-24: 5000 [IU] via SUBCUTANEOUS
  Filled 2015-01-23: qty 1

## 2015-01-23 MED ORDER — METRONIDAZOLE 500 MG PO TABS
500.0000 mg | ORAL_TABLET | Freq: Once | ORAL | Status: AC
  Administered 2015-01-23: 500 mg via ORAL

## 2015-01-23 MED ORDER — METRONIDAZOLE 500 MG PO TABS
ORAL_TABLET | ORAL | Status: AC
  Administered 2015-01-23: 500 mg via ORAL
  Filled 2015-01-23: qty 1

## 2015-01-23 MED ORDER — SODIUM CHLORIDE 0.9 % IV BOLUS (SEPSIS)
500.0000 mL | Freq: Once | INTRAVENOUS | Status: AC
Start: 2015-01-23 — End: 2015-01-23
  Administered 2015-01-23: 500 mL via INTRAVENOUS

## 2015-01-23 MED ORDER — METRONIDAZOLE 500 MG PO TABS
500.0000 mg | ORAL_TABLET | Freq: Three times a day (TID) | ORAL | Status: DC
  Administered 2015-01-24 – 2015-01-26 (×9): 500 mg via ORAL
  Filled 2015-01-23 (×8): qty 1

## 2015-01-23 MED ORDER — ACETAMINOPHEN 325 MG PO TABS
650.0000 mg | ORAL_TABLET | Freq: Four times a day (QID) | ORAL | Status: DC | PRN
  Administered 2015-01-25 (×2): 650 mg via ORAL
  Filled 2015-01-23 (×2): qty 2

## 2015-01-23 MED ORDER — SODIUM CHLORIDE 0.9 % IJ SOLN
3.0000 mL | Freq: Two times a day (BID) | INTRAMUSCULAR | Status: DC
  Administered 2015-01-24 (×2): 3 mL via INTRAVENOUS

## 2015-01-23 MED ORDER — MORPHINE SULFATE 2 MG/ML IJ SOLN: 2.0000 mg | INTRAMUSCULAR | Status: DC | PRN

## 2015-01-23 MED ORDER — ONDANSETRON HCL 4 MG PO TABS: 4.0000 mg | ORAL_TABLET | Freq: Four times a day (QID) | ORAL | Status: DC | PRN

## 2015-01-23 MED ORDER — ACETAMINOPHEN 650 MG RE SUPP: 650.0000 mg | Freq: Four times a day (QID) | RECTAL | Status: DC | PRN

## 2015-01-23 MED ORDER — POTASSIUM CHLORIDE 10 MEQ/100ML IV SOLN
10.0000 meq | Freq: Once | INTRAVENOUS | Status: AC
  Administered 2015-01-23: 10 meq via INTRAVENOUS
  Filled 2015-01-23 (×2): qty 100

## 2015-01-23 MED ORDER — HEPARIN SODIUM (PORCINE) 5000 UNIT/ML IJ SOLN
5000.0000 [IU] | Freq: Three times a day (TID) | INTRAMUSCULAR | Status: DC
  Administered 2015-01-24 – 2015-01-26 (×7): 5000 [IU] via SUBCUTANEOUS
  Filled 2015-01-23 (×6): qty 1

## 2015-01-23 NOTE — ED Notes (Addendum)
Patient denies pain and nausea at this time

## 2015-01-23 NOTE — ED Notes (Addendum)
Pt reports "watery diarrhea" and generalized fatigue that started on Friday. Pt denies abd pain. Pt reports x2 episodes of n/v on 6/4. Pt completed a course of Antibiotics for Pneumonia 2 weeks ago.

## 2015-01-23 NOTE — ED Provider Notes (Signed)
[facility] Emergency Department Provider Note  ____________________________________________  Time seen: 81  I have reviewed the triage vital signs and the nursing notes.  History from the patient and his wife.  HISTORY  Chief Complaint Diarrhea  nausea and vomiting  HPI AMISH MINTZER is a 64 y.o. male who is in the hospital 2 weeks ago with pneumonia. He was appropriately treated with antibiotics.  His wife reports that he has continued to have a degree of weakness since being discharged. Since Friday he has developed diarrhea. He has had numerous episodes of diarrhea yesterday (more than 5) and he has had multiple episodes today (at least 5). He has had a degree of nausea and vomiting. That he was vomiting yesterday. The wife reports it was initially food contents and then turned bilious.  He denies abdominal pain or cramping.   Past Medical History  Diagnosis Date  . ESRD (end stage renal disease)     a. on HD M,W,F; b. previously on transplant list at [facility] - removed 2016  . DM2 (diabetes mellitus, type 2)   . CAD (coronary artery disease)     a. cardiac cath 2014: LM nl, mLAD 30%, dLAD 30%, ostLCx 50%, mid ramus 20% OM3 80% pRCA 20%, mRCA 20%, PDA 100% w/ left to right collats, medically managed   . Stroke   . Chronic systolic CHF (congestive heart failure)     a. echo 12/2014: EF 35-40%, mod diffuse HK, RWMA cannot be excluded, LV diastolic fxn parameters nl, mild MR, moderately dilated LA, mildly dilated RV internal cavity size, mild TR, PASP nl  . Gallstones     Patient Active Problem List   Diagnosis Date Noted  . Chronic systolic CHF (congestive heart failure)   . CAD (coronary artery disease)   . Pneumonia 12/25/2014  . ESRD (end stage renal disease) 12/25/2014  . DM (diabetes mellitus) 12/25/2014  . CVA (cerebral infarction) 12/25/2014    History reviewed. No pertinent past surgical history.  Current Outpatient Rx  Name  Route  Sig   Dispense  Refill  . aspirin 325 MG tablet   Oral   Take 325 mg by mouth daily.         Marland Kitchen atorvastatin (LIPITOR) 10 MG tablet   Oral   Take 1 tablet (10 mg total) by mouth daily.   30 tablet   0   . ciprofloxacin (CIPRO) 500 MG tablet   Oral   Take 1 tablet (500 mg total) by mouth daily with breakfast.   10 tablet   0   . docusate sodium (COLACE) 100 MG capsule   Oral   Take 1 capsule (100 mg total) by mouth 2 (two) times daily.   60 capsule   2   . metoprolol tartrate (LOPRESSOR) 25 MG tablet   Oral   Take 1 tablet (25 mg total) by mouth 2 (two) times daily.   60 tablet   0   . sevelamer (RENAGEL) 800 MG tablet   Oral   Take 3,200 mg by mouth 3 (three) times daily with meals.           Allergies Review of patient's allergies indicates no known allergies.  Family History  Problem Relation Age of Onset  . Hypertension    . Diabetes      Social History History  Substance Use Topics  . Smoking status: Former Research scientist (life sciences)  . Smokeless tobacco: Not on file  . Alcohol Use: No  Review of Systems  Constitutional: Negative for fever. Positive for general weakness. ENT: Negative for sore throat. Cardiovascular: Negative for chest pain. Respiratory: Negative for shortness of breath. Gastrointestinal: Positive for diarrhea with nausea and vomiting. See history of present illness Renal: History of end-stage renal disease. On dialysis. No acute issues.  Genitourinary: Negative for dysuria. Musculoskeletal: Negative for back pain. Skin: Negative for rash. Neurological: Negative for headaches   10-point ROS otherwise negative.  ____________________________________________   PHYSICAL EXAM:  VITAL SIGNS: ED Triage Vitals  Enc Vitals Group     BP 01/23/15 1737 138/68 mmHg     Pulse Rate 01/23/15 1737 77     Resp 01/23/15 1737 16     Temp 01/23/15 1737 98.6 F (37 C)     Temp Source 01/23/15 1737 Oral     SpO2 01/23/15 1737 100 %     Weight 01/23/15 1737  180 lb (81.647 kg)     Height 01/23/15 1737 5\' 8"  (1.727 m)     Head Cir --      Peak Flow --      Pain Score 01/23/15 1738 0     Pain Loc --      Pain Edu? --      Excl. in [place]? --     Constitutional:  Alert and oriented. Patient appears mildly weak and uncomfortable.  ENT   Head: Normocephalic and atraumatic.   Nose: No congestion/rhinnorhea.   Mouth/Throat: Mucous membranes are moist. Cardiovascular: Normal rate, regular rhythm. Respiratory: Normal respiratory effort without tachypnea. Breath sounds are clear and equal bilaterally. No wheezes/rales/rhonchi. Gastrointestinal: Soft and nontender. No distention.  Back: No muscle spasm, no tenderness, no CVA tenderness. Musculoskeletal: Nontender with normal range of motion in all extremities.  No noted edema. Neurologic:  Normal speech and language. No gross focal neurologic deficits are appreciated.  Skin:  Skin is warm, dry. No rash noted. Psychiatric: Mood and affect are normal. Speech and behavior are normal.  ____________________________________________    LABS (pertinent positives/negatives)  White blood cell count 5.2, hemoglobin 7.8 Metabolic panel potassium 3.1, sodium 137. Glucose 139. BUN 25, creatinine 9.7. Troponin elevated at 0.17  ____________________________________________   EKG  ED ECG REPORT I, Cortnie Ringel W, the attending physician, personally viewed and interpreted this ECG.   Date: 01/23/2015  EKG Time: 1749  Rate: 81  Rhythm: Sinus rhythm with sinus arrhythmia, they're to premature ventricular contractions  Axis: -7  Intervals: Normal  ST&T Change: T's in V4 and by [place] B5. T-wave is flat in V3   ____________________________________________   ____________________________________________   INITIAL IMPRESSION / ASSESSMENT AND PLAN / ED COURSE  Patient with recent hospitalization and antibiotic use now with pronounced diarrhea with a slightly low potassium level of 3.1. We are  checking a C. difficile measurement. We will give 500 mL's of fluid with 10 mEq of potassium.  ----------------------------------------- 9:55 PM on 01/23/2015 -----------------------------------------  Given the patient's elevated troponin level of 0.17 we'll admit the patient for serial enzymes and ongoing monitoring. We have a seat if test ordered. We have treated him with metronidazole and begun potassium replacement for his low potassium at 3.1.  This was discussed with Dr. Lavetta Nielsen.  ____________________________________________   FINAL CLINICAL IMPRESSION(S) / ED DIAGNOSES  Final diagnoses:  Diarrhea  Troponin level elevated  Coronary artery disease involving native coronary artery of native heart without angina pectoris  Hypokalemia      Ahmed Prima, MD 01/23/15 2159

## 2015-01-23 NOTE — ED Notes (Signed)
Pt recently dx with gallstones and started having diarrhea on this past Friday. Pt c/o weakness today.

## 2015-01-23 NOTE — H&P (Signed)
[place] at [place] [name]    MR#:  [id number]  DATE OF BIRTH:  Jan 31, 1951   DATE OF ADMISSION:  01/23/2015  PRIMARY CARE PHYSICIAN: Wilhemena Durie, MD   REQUESTING/REFERRING PHYSICIAN: Thomasene Lot  CHIEF COMPLAINT:   Chief Complaint  Patient presents with  . Diarrhea    HISTORY OF PRESENT ILLNESS:  Kenn Rekowski  is a 64 y.o. male with a known history of end-stage renal disease on hemodialysis presenting with persistent nausea/vomiting/diarrhea. Patient recently discharged from hospital after being treated with antibiotics for pneumonia. Has not been having symptoms of nausea, vomiting, diarrhea for 3 day duration. Described as nonbloody/nonbilious emesis, watery diarrhea. Denies fevers, chills positive for generalized fatigue and weakness. Denies any chest pain or shortness of breath.  PAST MEDICAL HISTORY:   Past Medical History  Diagnosis Date  . ESRD (end stage renal disease)     a. on HD M,W,F; b. previously on transplant list at [facility] - removed 2016  . DM2 (diabetes mellitus, type 2)   . CAD (coronary artery disease)     a. cardiac cath 2014: LM nl, mLAD 30%, dLAD 30%, ostLCx 50%, mid ramus 20% OM3 80% pRCA 20%, mRCA 20%, PDA 100% w/ left to right collats, medically managed   . Stroke   . Chronic systolic CHF (congestive heart failure)     a. echo 12/2014: EF 35-40%, mod diffuse HK, RWMA cannot be excluded, LV diastolic fxn parameters nl, mild MR, moderately dilated LA, mildly dilated RV internal cavity size, mild TR, PASP nl  . Gallstones     PAST SURGICAL HISTORY:  History reviewed. No pertinent past surgical history.  SOCIAL HISTORY:   History  Substance Use Topics  . Smoking status: Former Research scientist (life sciences)  . Smokeless tobacco: Not on file  . Alcohol Use: No    FAMILY HISTORY:   Family History  Problem Relation Age of Onset  . Hypertension    . Diabetes      DRUG ALLERGIES:  No Known  Allergies  REVIEW OF SYSTEMS:  REVIEW OF SYSTEMS:  CONSTITUTIONAL: Denies fevers, chills, positive fatigue, weakness.  EYES: Denies blurred vision, double vision, or eye pain.  EARS, NOSE, THROAT: Denies tinnitus, ear pain, hearing loss.  RESPIRATORY: denies cough, shortness of breath, wheezing  CARDIOVASCULAR: Denies chest pain, palpitations, edema.  GASTROINTESTINAL: Positive nausea, vomiting, diarrhea, denies abdominal pain.  GENITOURINARY: Denies dysuria, hematuria.  ENDOCRINE: Denies nocturia or thyroid problems. HEMATOLOGIC AND LYMPHATIC: Denies easy bruising or bleeding.  SKIN: Denies rash or lesions.  MUSCULOSKELETAL: Denies pain in neck, back, shoulder, knees, hips, or further arthritic symptoms.  NEUROLOGIC: Denies paralysis, paresthesias.  PSYCHIATRIC: Denies anxiety or depressive symptoms. Otherwise full review of systems performed by me is negative.   MEDICATIONS AT HOME:   Prior to Admission medications   Medication Sig Start Date End Date Taking? Authorizing Provider  aspirin 325 MG tablet Take 325 mg by mouth daily.    Historical Provider, MD  atorvastatin (LIPITOR) 10 MG tablet Take 1 tablet (10 mg total) by mouth daily. 01/01/15   Epifanio Lesches, MD  ciprofloxacin (CIPRO) 500 MG tablet Take 1 tablet (500 mg total) by mouth daily with breakfast. Patient not taking: Reported on 01/23/2015 01/01/15   Epifanio Lesches, MD  docusate sodium (COLACE) 100 MG capsule Take 1 capsule (100 mg total) by mouth 2 (two) times daily. Patient not taking: Reported on 01/23/2015 12/23/14 12/23/15  Lavonia Drafts, MD  metoprolol tartrate (  LOPRESSOR) 25 MG tablet Take 1 tablet (25 mg total) by mouth 2 (two) times daily. 01/01/15   Epifanio Lesches, MD  sevelamer (RENAGEL) 800 MG tablet Take 3,200 mg by mouth 3 (three) times daily with meals.    Historical Provider, MD      VITAL SIGNS:  Blood pressure 141/76, pulse 83, temperature 99.1 F (37.3 C), temperature source Oral, resp. rate  10, height 5\' 8"  (1.727 m), weight 180 lb (81.647 kg), SpO2 97 %.  PHYSICAL EXAMINATION:  VITAL SIGNS: Filed Vitals:   01/23/15 1923  BP: 141/76  Pulse: 83  Temp: 99.1 F (37.3 C)  Resp: 10   GENERAL:63 y.o.male currently in no acute distress.  HEAD: Normocephalic, atraumatic.  EYES: Pupils equal, round, reactive to light. Extraocular muscles intact. No scleral icterus.  MOUTH: Moist mucosal membrane. Dentition intact. No abscess noted.  EAR, NOSE, THROAT: Clear without exudates. No external lesions.  NECK: Supple. No thyromegaly. No nodules. No JVD.  PULMONARY: Clear to ascultation, without wheeze rails or rhonci. No use of accessory muscles, Good respiratory effort. good air entry bilaterally CHEST: Nontender to palpation.  CARDIOVASCULAR: S1 and S2. Regular rate and rhythm. No murmurs, rubs, or gallops. No edema. Pedal pulses 2+ bilaterally.  GASTROINTESTINAL: Soft, nontender, nondistended. No masses. Positive bowel sounds. No hepatosplenomegaly.  MUSCULOSKELETAL: No swelling, clubbing, or edema. Range of motion full in all extremities.  NEUROLOGIC: Cranial nerves II through XII are intact. No gross focal neurological deficits. Sensation intact. Reflexes intact.  SKIN: No ulceration, lesions, rashes, or cyanosis. Skin warm and dry. Turgor intact.  PSYCHIATRIC: Mood, affect within normal limits. The patient is awake, alert and oriented x 3. Insight, judgment intact.    LABORATORY PANEL:   CBC  Recent Labs Lab 01/23/15 1742  WBC 5.2  HGB 7.8*  HCT 24.9*  PLT 152   ------------------------------------------------------------------------------------------------------------------  Chemistries   Recent Labs Lab 01/23/15 1742  NA 137  K 3.1*  CL 94*  CO2 32  GLUCOSE 139*  BUN 25*  CREATININE 9.77*  CALCIUM 8.7*   ------------------------------------------------------------------------------------------------------------------  Cardiac Enzymes  Recent Labs Lab  01/23/15 1742  TROPONINI 0.17*   ------------------------------------------------------------------------------------------------------------------  RADIOLOGY:  No results found.  EKG:   Orders placed or performed during the hospital encounter of 01/23/15  . ED EKG  . ED EKG    IMPRESSION AND PLAN:   64 year old African-American gentleman history of end-stage renal disease on hemodialysis presenting with persistent nausea/vomiting/diarrhea  1. Intractable nausea, vomiting, diarrhea: Concern for C. difficile given recent antibiotics usage check C. difficile PCR place on isolation precautions emergency department already dosed Flagyl 1 if positive continue if negative supportive measures 2. Elevated troponin: Place on telemetry trend cardiac enzymes 3 3. End-stage renal disease hemodialysis: Consult nephrology for dialysis continuation 4. Essential hypertension: Lopressor 5.Venous thromboembolism prophylactic: Heparin subcutaneous     All the records are reviewed and case discussed with ED provider. Management plans discussed with the patient, family and they are in agreement.  CODE STATUS: Full  TOTAL TIME TAKING CARE OF THIS PATIENT: 35 minutes.    Meghna Hagmann,  Karenann Cai.D on 01/23/2015 at 11:04 PM  Between 7am to 6pm - Pager - [phone]  After 6pm: House Pager: - [phone]  [facility]  Office  [phone]  CC: Primary care physician; Wilhemena Durie, MD

## 2015-01-24 DIAGNOSIS — E876 Hypokalemia: Secondary | ICD-10-CM | POA: Diagnosis present

## 2015-01-24 DIAGNOSIS — I5022 Chronic systolic (congestive) heart failure: Secondary | ICD-10-CM | POA: Diagnosis present

## 2015-01-24 DIAGNOSIS — E119 Type 2 diabetes mellitus without complications: Secondary | ICD-10-CM | POA: Diagnosis present

## 2015-01-24 DIAGNOSIS — A047 Enterocolitis due to Clostridium difficile: Secondary | ICD-10-CM | POA: Diagnosis present

## 2015-01-24 DIAGNOSIS — Z87891 Personal history of nicotine dependence: Secondary | ICD-10-CM | POA: Diagnosis not present

## 2015-01-24 DIAGNOSIS — I120 Hypertensive chronic kidney disease with stage 5 chronic kidney disease or end stage renal disease: Secondary | ICD-10-CM | POA: Diagnosis present

## 2015-01-24 DIAGNOSIS — N2581 Secondary hyperparathyroidism of renal origin: Secondary | ICD-10-CM | POA: Diagnosis not present

## 2015-01-24 DIAGNOSIS — R748 Abnormal levels of other serum enzymes: Secondary | ICD-10-CM | POA: Diagnosis present

## 2015-01-24 DIAGNOSIS — Z7982 Long term (current) use of aspirin: Secondary | ICD-10-CM | POA: Diagnosis not present

## 2015-01-24 DIAGNOSIS — D631 Anemia in chronic kidney disease: Secondary | ICD-10-CM | POA: Diagnosis present

## 2015-01-24 DIAGNOSIS — A09 Infectious gastroenteritis and colitis, unspecified: Secondary | ICD-10-CM | POA: Diagnosis not present

## 2015-01-24 DIAGNOSIS — Z8673 Personal history of transient ischemic attack (TIA), and cerebral infarction without residual deficits: Secondary | ICD-10-CM | POA: Diagnosis not present

## 2015-01-24 DIAGNOSIS — Z8249 Family history of ischemic heart disease and other diseases of the circulatory system: Secondary | ICD-10-CM | POA: Diagnosis not present

## 2015-01-24 DIAGNOSIS — I2510 Atherosclerotic heart disease of native coronary artery without angina pectoris: Secondary | ICD-10-CM | POA: Diagnosis present

## 2015-01-24 DIAGNOSIS — Z79899 Other long term (current) drug therapy: Secondary | ICD-10-CM | POA: Diagnosis not present

## 2015-01-24 DIAGNOSIS — A0472 Enterocolitis due to Clostridium difficile, not specified as recurrent: Secondary | ICD-10-CM | POA: Diagnosis present

## 2015-01-24 DIAGNOSIS — I10 Essential (primary) hypertension: Secondary | ICD-10-CM | POA: Diagnosis not present

## 2015-01-24 DIAGNOSIS — R7989 Other specified abnormal findings of blood chemistry: Secondary | ICD-10-CM | POA: Diagnosis not present

## 2015-01-24 DIAGNOSIS — N186 End stage renal disease: Secondary | ICD-10-CM | POA: Diagnosis present

## 2015-01-24 DIAGNOSIS — Z833 Family history of diabetes mellitus: Secondary | ICD-10-CM | POA: Diagnosis not present

## 2015-01-24 DIAGNOSIS — Z992 Dependence on renal dialysis: Secondary | ICD-10-CM | POA: Diagnosis not present

## 2015-01-24 DIAGNOSIS — R197 Diarrhea, unspecified: Secondary | ICD-10-CM | POA: Diagnosis not present

## 2015-01-24 LAB — RENAL FUNCTION PANEL
ANION GAP: 10 (ref 5–15)
Albumin: 2.6 g/dL — ABNORMAL LOW (ref 3.5–5.0)
BUN: 30 mg/dL — AB (ref 6–20)
CHLORIDE: 96 mmol/L — AB (ref 101–111)
CO2: 30 mmol/L (ref 22–32)
Calcium: 7.6 mg/dL — ABNORMAL LOW (ref 8.9–10.3)
Creatinine, Ser: 10.94 mg/dL — ABNORMAL HIGH (ref 0.61–1.24)
GFR calc Af Amer: 5 mL/min — ABNORMAL LOW (ref >60)
GFR, EST NON AFRICAN AMERICAN: 4 mL/min — AB (ref >60)
GLUCOSE: 146 mg/dL — AB (ref 65–99)
PHOSPHORUS: 4 mg/dL (ref 2.5–4.6)
Potassium: 3.4 mmol/L — ABNORMAL LOW (ref 3.5–5.1)
SODIUM: 136 mmol/L (ref 135–145)

## 2015-01-24 LAB — TROPONIN I
Troponin I: 0.15 ng/mL — ABNORMAL HIGH (ref <0.031)
Troponin I: 0.14 ng/mL — ABNORMAL HIGH (ref <0.031)
Troponin I: 0.13 ng/mL — ABNORMAL HIGH (ref <0.031)

## 2015-01-24 MED ORDER — SEVELAMER CARBONATE 800 MG PO TABS
800.0000 mg | ORAL_TABLET | Freq: Three times a day (TID) | ORAL | Status: DC
  Administered 2015-01-24 – 2015-01-26 (×9): 800 mg via ORAL
  Filled 2015-01-24 (×9): qty 1

## 2015-01-24 MED ORDER — ASPIRIN 325 MG PO TABS
325.0000 mg | ORAL_TABLET | Freq: Every day | ORAL | Status: DC
  Administered 2015-01-25 – 2015-01-26 (×2): 325 mg via ORAL
  Filled 2015-01-24 (×6): qty 1

## 2015-01-24 MED ORDER — METOPROLOL TARTRATE 25 MG PO TABS
25.0000 mg | ORAL_TABLET | Freq: Two times a day (BID) | ORAL | Status: DC
  Administered 2015-01-24 – 2015-01-26 (×4): 25 mg via ORAL
  Filled 2015-01-24 (×5): qty 1

## 2015-01-24 MED ORDER — EPOETIN ALFA 10000 UNIT/ML IJ SOLN
10000.0000 [IU] | Freq: Once | INTRAMUSCULAR | Status: AC
  Administered 2015-01-24: 17:00:00 10000 [IU] via INTRAVENOUS

## 2015-01-24 MED ORDER — EPOETIN ALFA 10000 UNIT/ML IJ SOLN: 10000.0000 [IU] | Freq: Once | INTRAMUSCULAR | Status: DC

## 2015-01-24 MED ORDER — SODIUM CHLORIDE 0.9 % IV SOLN: 100.0000 mL | INTRAVENOUS | Status: DC | PRN

## 2015-01-24 MED ORDER — ATORVASTATIN CALCIUM 10 MG PO TABS
10.0000 mg | ORAL_TABLET | Freq: Every day | ORAL | Status: DC
  Administered 2015-01-25 – 2015-01-26 (×2): 10 mg via ORAL
  Filled 2015-01-24 (×3): qty 1

## 2015-01-24 NOTE — Progress Notes (Signed)
[facility]  ROUNDING NOTE   Subjective:   Admitted for diarrhea. Found to be C. Diff positive.  Dialysis for later today.  Wife and sister at bedside.   Objective:  Vital signs in last 24 hours:  Temp:  [98.4 F (36.9 C)-99.1 F (37.3 C)] 98.6 F (37 C) (06/06 0459) Pulse Rate:  [64-83] 77 (06/06 1035) Resp:  [10-18] 18 (06/06 0459) BP: (114-146)/(57-80) 114/57 mmHg (06/06 1035) SpO2:  [94 %-100 %] 97 % (06/06 0459) Weight:  [79.334 kg (174 lb 14.4 oz)-81.647 kg (180 lb)] 79.334 kg (174 lb 14.4 oz) (06/06 0032)  Weight change:  Filed Weights   01/23/15 1737 01/24/15 0032  Weight: 81.647 kg (180 lb) 79.334 kg (174 lb 14.4 oz)    Intake/Output:     Intake/Output this shift:     Physical Exam: General: NAD,   Head: Normocephalic, atraumatic. Moist oral mucosal membranes  Eyes: Anicteric, PERRL  Neck: Supple, trachea midline  Lungs:  Clear to auscultation  Heart: Regular rate and rhythm  Abdomen:  Soft, nontender,   Extremities: no peripheral edema.  Neurologic: Nonfocal, moving all four extremities  Skin: No lesions  Access: AVF    Basic Metabolic Panel:  Recent Labs Lab 01/23/15 1742  NA 137  K 3.1*  CL 94*  CO2 32  GLUCOSE 139*  BUN 25*  CREATININE 9.77*  CALCIUM 8.7*    Liver Function Tests: No results for input(s): AST, ALT, ALKPHOS, BILITOT, PROT, ALBUMIN in the last 168 hours. No results for input(s): LIPASE, AMYLASE in the last 168 hours. No results for input(s): AMMONIA in the last 168 hours.  CBC:  Recent Labs Lab 01/23/15 1742  WBC 5.2  HGB 7.8*  HCT 24.9*  MCV 86.3  PLT 152    Cardiac Enzymes:  Recent Labs Lab 01/23/15 1742 01/24/15 0104 01/24/15 0637  TROPONINI 0.17* 0.15* 0.14*    BNP: Invalid input(s): POCBNP  CBG: No results for input(s): GLUCAP in the last 168 hours.  Microbiology: Results for orders placed or performed during the hospital encounter of 01/23/15  C difficile quick scan w PCR  reflex [facility])     Status: None   Collection Time: 01/23/15  8:41 PM  Result Value Ref Range Status   C Diff antigen POSITIVE  Final   C Diff toxin POSITIVE  Final   C Diff interpretation   Final    Positive for toxigenic C. difficile, active toxin production present.    Comment: CRITICAL RESULT CALLED TO, READ BACK BY AND VERIFIED WITH: luis flores @ 2306 6.5.16 mpg     Coagulation Studies: No results for input(s): LABPROT, INR in the last 72 hours.  Urinalysis: No results for input(s): COLORURINE, LABSPEC, PHURINE, GLUCOSEU, HGBUR, BILIRUBINUR, KETONESUR, PROTEINUR, UROBILINOGEN, NITRITE, LEUKOCYTESUR in the last 72 hours.  Invalid input(s): APPERANCEUR    Imaging: No results found.   Medications:   . sodium chloride 50 mL/hr at 01/24/15 0002   . aspirin  325 mg Oral Daily  . atorvastatin  10 mg Oral Daily  . heparin  5,000 Units Subcutaneous 3 times per day  . metoprolol tartrate  25 mg Oral BID  . metroNIDAZOLE  500 mg Oral 3 times per day  . sevelamer carbonate  800 mg Oral TID WC  . sodium chloride  3 mL Intravenous Q12H   acetaminophen **OR** acetaminophen, morphine injection, ondansetron **OR** ondansetron (ZOFRAN) IV  Assessment/ Plan:  Luis Walter is a 64 y.o. black  male  With End stage renal disease, hypertension, diabetes mellitus type II, CVA, coronary artery disease who presents to [facility] on 12/25/2014 for right lower lobe pneumonia. Now admitted to [facility] on 01/23/2015 for C. Diff colitis.   [facility]/ [address] MWF  1. End Stage Renal Disease: hemodialysis planned for later today.   2. Hypertension: blood pressure at goal. Continue metoprolol  3. C. Diff colitis: metronidazole. GI symptoms improving  4. Anemia of chronic kidney disease: hemoglobin 7.8 - epo with HD treatment  5. Secondary Hyperparathyroidism: phos 3.7 - Continue sevelamer    LOS: 0 Hillman Attig 6/6/201611:30 AM

## 2015-01-24 NOTE — Progress Notes (Signed)
PRE HD   

## 2015-01-24 NOTE — Plan of Care (Signed)
Problem: Discharge Progression Outcomes Goal: Other Discharge Outcomes/Goals Outcome: Progressing Plan of care progress to goal: Pt had dialysis today.  Pt was tx for PNA 2 wks ago and came in last night positive for Cdiff.  Tele d/ced today - NSR w/PVCs.  Wife at bedside.  Walks to [place] w/asst. Troponins elevated.  No c/o pain.

## 2015-01-24 NOTE — Care Management (Signed)
Admitted to [facility] with the diagnosis of intractable nausea/vomitting, Lives with wife, Adonis Huguenin [phone]). No home health. No skilled facility. No home oxygen. Dialysis patient at [facility] on Monday-Wednesday-Friday x 6 years. Takes care of all basic activities of daily living himself, still drives.  States he has seen Dr, Rosanna Randy in the past, but plans to get another primary care physician for follow-up care. Family will transport. Wife at the bedside. Shelbie Ammons RN MSN Care Management [phone]

## 2015-01-24 NOTE — Progress Notes (Signed)
HD END 

## 2015-01-24 NOTE — Plan of Care (Signed)
Problem: Discharge Progression Outcomes Goal: Other Discharge Outcomes/Goals Plan of care progress to goal: Denies pain Diarrhea noted-started on flagyl po, tolerated well Activity: up with standby assistance

## 2015-01-24 NOTE — Progress Notes (Signed)
HD START 

## 2015-01-24 NOTE — Progress Notes (Signed)
[facility]  Patient Status: Active (receiving services up to time of hospitalization)  [facility] is providing the following services: PT  If patient discharges after hours, please call [phone].   Luis Walter 01/24/2015, 5:31 PM

## 2015-01-24 NOTE — ED Notes (Signed)
Patient denies pain and is resting comfortably.  

## 2015-01-24 NOTE — Plan of Care (Signed)
Problem: Discharge Progression Outcomes Goal: Discharge plan in place and appropriate Individualization Pt admitted with nausea and vomiting, diarrhea Positive for Cdiff-finish antibiotics 2 weeks ago for pneumonia Low fall risk Dialysis M-W-F Medical Hx of DM, CAD, CHF, ESRD-controlled with home medications

## 2015-01-24 NOTE — Progress Notes (Signed)
POST HD 

## 2015-01-24 NOTE — Progress Notes (Signed)
[place] at [place] [name]    MR#:  [id number]  DATE OF BIRTH:  11-16-1950  SUBJECTIVE:  CHIEF COMPLAINT:   Chief Complaint  Patient presents with  . Diarrhea   Pt. Here w/ diarrhea and noted to have acute c. Diff colitis.   Had one loose stool today so far.  No N/V or abdominal pain.   REVIEW OF SYSTEMS:    Review of Systems  Constitutional: Negative for fever and chills.  HENT: Negative for congestion and tinnitus.   Eyes: Negative for blurred vision and double vision.  Respiratory: Negative for cough, shortness of breath and wheezing.   Cardiovascular: Negative for chest pain, orthopnea and PND.  Gastrointestinal: Positive for diarrhea. Negative for nausea, vomiting and abdominal pain.  Genitourinary: Negative for dysuria and hematuria.  Skin: Negative for rash.  Neurological: Negative for dizziness, sensory change and focal weakness.  All other systems reviewed and are negative.   Nutrition: Renal diet Tolerating Diet: Yes Tolerating PT:  Ambulatory.    DRUG ALLERGIES:  No Known Allergies  VITALS:  Blood pressure 114/57, pulse 77, temperature 98.6 F (37 C), temperature source Oral, resp. rate 18, height 5\' 8"  (1.727 m), weight 79.334 kg (174 lb 14.4 oz), SpO2 97 %.  PHYSICAL EXAMINATION:   Physical Exam  GENERAL:  64 y.o.-year-old patient lying in the bed with no acute distress.  EYES: Pupils equal, round, reactive to light and accommodation. No scleral icterus. Extraocular muscles intact.  HEENT: Head atraumatic, normocephalic. Oropharynx and nasopharynx clear.  NECK:  Supple, no jugular venous distention. No thyroid enlargement, no tenderness.  LUNGS: Normal breath sounds bilaterally, no wheezing, rales, rhonchi. No use of accessory muscles of respiration.  CARDIOVASCULAR: S1, S2 normal. No murmurs, rubs, gallops, clicks.  ABDOMEN: Soft, nontender, nondistended. Bowel sounds present. No  organomegaly or mass.  EXTREMITIES: No cyanosis, clubbing or edema b/l.   NEUROLOGIC: Cranial nerves II through XII are intact. No focal Motor or sensory deficits b/l.   PSYCHIATRIC: The patient is alert and oriented x 3. Good affect.  SKIN: No obvious rash, lesion, or ulcer.   LUE AV fistula w/ good bruit, good thrill.    LABORATORY PANEL:   CBC  Recent Labs Lab 01/23/15 1742  WBC 5.2  HGB 7.8*  HCT 24.9*  PLT 152   ------------------------------------------------------------------------------------------------------------------  Chemistries   Recent Labs Lab 01/23/15 1742  NA 137  K 3.1*  CL 94*  CO2 32  GLUCOSE 139*  BUN 25*  CREATININE 9.77*  CALCIUM 8.7*   ------------------------------------------------------------------------------------------------------------------  Cardiac Enzymes  Recent Labs Lab 01/24/15 0637  TROPONINI 0.14*   ------------------------------------------------------------------------------------------------------------------  RADIOLOGY:  No results found.   ASSESSMENT AND PLAN:   64 yo male w/ hx of ESRD on HD, DM, CAD, hx of CVA, hx of systolic CHF, who presented to the hospital w/ diarrhea and noted to have C. Diff colitis.   1. Acute. C. Diff - likely cause of pt's diarrhea.  - pt. Was recently on abx for pneumonia.  - cont. Oral Flagyl.  Tolerating PO well.  No abdominal pain, N/V.   2. ESRD on HD - Nephro consult.  - dialysis on MWF and will get HD today.   3. Hypokalemia - likely related to diarrhea.  Will follow potassium.  - hold replacement as pt. Has ESRD  4. Elevated Troponin - likely in the setting of poor renal clearance from ESRD.  - Troponin's have not trended  up.  - no chest pain.  D/c TEle.  Cont. ASA, Statin, B-blocker.   5. Hx of CAD - cont. ASA, Metoprolol, Atorvastatin  6. Secondary Hyperparathyroidism - cont. Renvela.    All the records are reviewed and case discussed with Care  Management/Social Workerr. Management plans discussed with the patient, family and they are in agreement.  CODE STATUS: Full  DVT Prophylaxis: Ambulatory  TOTAL TIME TAKING CARE OF THIS PATIENT: 25 minutes.   POSSIBLE D/C IN 1-2 DAYS, DEPENDING ON CLINICAL CONDITION.   Henreitta Leber M.D on 01/24/2015 at 1:20 PM  Between 7am to 6pm - Pager - [phone]  After 6pm go to [email or web address] - password [email or web address] [facility]  Office  [phone]  CC: Primary care physician; Wilhemena Durie, MD

## 2015-01-25 LAB — BASIC METABOLIC PANEL
ANION GAP: 10 (ref 5–15)
BUN: 17 mg/dL (ref 6–20)
CO2: 30 mmol/L (ref 22–32)
Calcium: 8.1 mg/dL — ABNORMAL LOW (ref 8.9–10.3)
Chloride: 97 mmol/L — ABNORMAL LOW (ref 101–111)
Creatinine, Ser: 7.46 mg/dL — ABNORMAL HIGH (ref 0.61–1.24)
GFR calc Af Amer: 8 mL/min — ABNORMAL LOW (ref >60)
GFR calc non Af Amer: 7 mL/min — ABNORMAL LOW (ref >60)
Glucose, Bld: 93 mg/dL (ref 65–99)
Potassium: 3.3 mmol/L — ABNORMAL LOW (ref 3.5–5.1)
Sodium: 137 mmol/L (ref 135–145)

## 2015-01-25 LAB — HEPATITIS B SURFACE ANTIBODY,QUALITATIVE: Hep B S Ab: REACTIVE

## 2015-01-25 LAB — HEPATITIS B CORE ANTIBODY, TOTAL: Hep B Core Total Ab: NEGATIVE

## 2015-01-25 LAB — HEPATITIS B SURFACE ANTIGEN: HEP B S AG: NEGATIVE

## 2015-01-25 NOTE — Progress Notes (Signed)
[facility] [place] NAME: Siddhartha Hoback    MR#:  [id number]  DATE OF BIRTH:  [birth year]  SUBJECTIVE:  CHIEF COMPLAINT:   Chief Complaint  Patient presents with  . Diarrhea   Pt. Here w/ diarrhea and noted to have acute c. Diff colitis.  Had 2 loose stools today.  No abdominal pain, N/V or fever.     REVIEW OF SYSTEMS:    Review of Systems  Constitutional: Negative for fever and chills.  HENT: Negative for congestion and tinnitus.   Eyes: Negative for blurred vision and double vision.  Respiratory: Negative for cough, shortness of breath and wheezing.   Cardiovascular: Negative for chest pain, orthopnea and PND.  Gastrointestinal: Positive for diarrhea (improved. ). Negative for nausea, vomiting and abdominal pain.  Genitourinary: Negative for dysuria and hematuria.  Neurological: Negative for dizziness, sensory change and focal weakness.  All other systems reviewed and are negative.   Nutrition: Renal diet Tolerating Diet: Yes Tolerating PT:  Ambulatory.    DRUG ALLERGIES:  No Known Allergies  VITALS:  Blood pressure 108/60, pulse 63, temperature 97.6 F (36.4 C), temperature source Oral, resp. rate 18, height 5\' 8"  (1.727 m), weight 83.6 kg (184 lb 4.9 oz), SpO2 94 %.  PHYSICAL EXAMINATION:   Physical Exam  GENERAL:  64 y.o.-year-old patient lying in the bed with no acute distress.  EYES: Pupils equal, round, reactive to light and accommodation. No scleral icterus. Extraocular muscles intact.  HEENT: Head atraumatic, normocephalic. Oropharynx and nasopharynx clear.  NECK:  Supple, no jugular venous distention. No thyroid enlargement, no tenderness.  LUNGS: Normal breath sounds bilaterally, no wheezing, rales, rhonchi. No use of accessory muscles of respiration.  CARDIOVASCULAR: S1, S2 normal. No murmurs, rubs, gallops, clicks.  ABDOMEN: Soft, nontender, nondistended. Bowel sounds present. No organomegaly or mass.   EXTREMITIES: No cyanosis, clubbing or edema b/l.   NEUROLOGIC: Cranial nerves II through XII are intact. No focal Motor or sensory deficits b/l.   PSYCHIATRIC: The patient is alert and oriented x 3. Good affect.  SKIN: No obvious rash, lesion, or ulcer.   LUE AV fistula w/ good bruit, good thrill.    LABORATORY PANEL:   CBC  Recent Labs Lab 01/23/15 1742  WBC 5.2  HGB 7.8*  HCT 24.9*  PLT 152   ------------------------------------------------------------------------------------------------------------------  Chemistries   Recent Labs Lab 01/25/15 0421  NA 137  K 3.3*  CL 97*  CO2 30  GLUCOSE 93  BUN 17  CREATININE 7.46*  CALCIUM 8.1*   ------------------------------------------------------------------------------------------------------------------  Cardiac Enzymes  Recent Labs Lab 01/24/15 1305  TROPONINI 0.13*   ------------------------------------------------------------------------------------------------------------------  RADIOLOGY:  No results found.   ASSESSMENT AND PLAN:   64 yo male w/ hx of ESRD on HD, DM, CAD, hx of CVA, hx of systolic CHF, who presented to the hospital w/ diarrhea and noted to have C. Diff colitis.   1. Acute. C. Diff - likely cause of pt's diarrhea.  Improving and had only 2 loose stools today.    - pt. Was recently on abx for pneumonia.  - cont. Oral Flagyl.  Tolerating PO well.  No abdominal pain, N/V.   2. ESRD on HD - Nephro consult.  - dialysis on MWF and tolerated it well yesterday.    3. Hypokalemia - likely related to diarrhea.  Will follow potassium.  - hold replacement as pt. Has ESRD  4. Elevated Troponin - likely in the setting of poor renal  clearance from ESRD.  - Troponin's have not trended up.  - no chest pain.  Cont. ASA, Statin, B-blocker.   5. Hx of CAD - cont. ASA, Metoprolol, Atorvastatin  6. Secondary Hyperparathyroidism - cont. Renvela.   Likely d/c home tomorrow on oral flagyl.    All  the records are reviewed and case discussed with Care Management/Social Workerr. Management plans discussed with the patient, family and they are in agreement.  CODE STATUS: Full  DVT Prophylaxis: Ambulatory  TOTAL TIME TAKING CARE OF THIS PATIENT: 25 minutes.   POSSIBLE D/C tomorrow on Oral Flagyl.     Henreitta Leber M.D on 01/25/2015 at 2:29 PM  Between 7am to 6pm - Pager - [phone]  After 6pm go to [email or web address] - password [email or web address] [facility]  Office  [phone]  CC: Primary care physician; Wilhemena Durie, MD

## 2015-01-25 NOTE — Plan of Care (Signed)
Problem: Discharge Progression Outcomes Goal: Discharge plan in place and appropriate Individualization  Pt admitted with nausea and vomiting, diarrhea Positive for Cdiff-finish antibiotics 2 weeks ago for pneumonia Moderate fall risk Dialysis M-W-F Medical Hx of DM, CAD, CHF, ESRD-controlled with home medications       Goal: Other Discharge Outcomes/Goals Outcome: Progressing Plan of care progress to goals: VSS. Denies n/v/pain. 3xloose stool during the night. Continue IVF, tolerating diet.

## 2015-01-25 NOTE — Plan of Care (Signed)
Problem: Discharge Progression Outcomes Goal: Discharge plan in place and appropriate Individualization  Outcome: Progressing Pt admitted with nausea and vomiting, diarrhea Positive for Cdiff-finish antibiotics 2 weeks ago for pneumonia Moderate fall risk Dialysis M-W-F Medical Hx of DM, CAD, CHF, ESRD-controlled with home medications     Goal: Other Discharge Outcomes/Goals Outcome: Progressing Plan of Care Progressing to Goal: Pain medications given for backache with noted relief. VSS. Tolerating diet well. Will receive dialysis tomorrow.

## 2015-01-25 NOTE — Progress Notes (Signed)
[facility]  ROUNDING NOTE   Subjective:   Hemodialysis yesterday. Tolerated treatment however had to stop for diarrhea at times. UF of 0    Objective:  Vital signs in last 24 hours:  Temp:  [97.6 F (36.4 C)-99.7 F (37.6 C)] 97.6 F (36.4 C) (06/07 1425) Pulse Rate:  [63-81] 63 (06/07 1425) Resp:  [18-20] 18 (06/07 0520) BP: (108-148)/(45-69) 108/60 mmHg (06/07 1425) SpO2:  [93 %-98 %] 94 % (06/07 1425) Weight:  [83.6 kg (184 lb 4.9 oz)] 83.6 kg (184 lb 4.9 oz) (06/06 1815)  Weight change: 2.553 kg (5 lb 10 oz) Filed Weights   01/24/15 0032 01/24/15 1430 01/24/15 1815  Weight: 79.334 kg (174 lb 14.4 oz) 84.2 kg (185 lb 10 oz) 83.6 kg (184 lb 4.9 oz)    Intake/Output: I/O last 3 completed shifts: In: -  Out: 500 [Other:500]   Intake/Output this shift:     Physical Exam: General: NAD  Head: Normocephalic, atraumatic. Moist oral mucosal membranes  Eyes: Anicteric, PERRL  Neck: Supple, trachea midline  Lungs:  Clear to auscultation  Heart: Regular rate and rhythm  Abdomen:  Soft, nontender,   Extremities: no peripheral edema.  Neurologic: Nonfocal, moving all four extremities  Skin: No lesions  Access: AVF    Basic Metabolic Panel:  Recent Labs Lab 01/23/15 1742 01/24/15 1305 01/25/15 0421  NA 137 136 137  K 3.1* 3.4* 3.3*  CL 94* 96* 97*  CO2 32 30 30  GLUCOSE 139* 146* 93  BUN 25* 30* 17  CREATININE 9.77* 10.94* 7.46*  CALCIUM 8.7* 7.6* 8.1*  PHOS  --  4.0  --     Liver Function Tests:  Recent Labs Lab 01/24/15 1305  ALBUMIN 2.6*   No results for input(s): LIPASE, AMYLASE in the last 168 hours. No results for input(s): AMMONIA in the last 168 hours.  CBC:  Recent Labs Lab 01/23/15 1742  WBC 5.2  HGB 7.8*  HCT 24.9*  MCV 86.3  PLT 152    Cardiac Enzymes:  Recent Labs Lab 01/23/15 1742 01/24/15 0104 01/24/15 0637 01/24/15 1305  TROPONINI 0.17* 0.15* 0.14* 0.13*    BNP: Invalid input(s): POCBNP  CBG: No  results for input(s): GLUCAP in the last 168 hours.  Microbiology: Results for orders placed or performed during the hospital encounter of 01/23/15  Stool culture     Status: None (Preliminary result)   Collection Time: 01/23/15  8:41 PM  Result Value Ref Range Status   Specimen Description STOOL  Final   Special Requests Normal  Final   Culture   Final    NO SALMONELLA OR SHIGELLA ISOLATED No Pathogenic E. coli detected NO CAMPYLOBACTER DETECTED    Report Status PENDING  Incomplete  C difficile quick scan w PCR reflex [facility])     Status: None   Collection Time: 01/23/15  8:41 PM  Result Value Ref Range Status   C Diff antigen POSITIVE  Final   C Diff toxin POSITIVE  Final   C Diff interpretation   Final    Positive for toxigenic C. difficile, active toxin production present.    Comment: CRITICAL RESULT CALLED TO, READ BACK BY AND VERIFIED WITH: luis flores @ 2306 6.5.16 mpg     Coagulation Studies: No results for input(s): LABPROT, INR in the last 72 hours.  Urinalysis: No results for input(s): COLORURINE, LABSPEC, PHURINE, GLUCOSEU, HGBUR, BILIRUBINUR, KETONESUR, PROTEINUR, UROBILINOGEN, NITRITE, LEUKOCYTESUR in the last 72 hours.  Invalid input(s): APPERANCEUR  Imaging: No results found.   Medications:   . sodium chloride 50 mL/hr at 01/24/15 2358   . aspirin  325 mg Oral Daily  . atorvastatin  10 mg Oral Daily  . heparin  5,000 Units Subcutaneous 3 times per day  . metoprolol tartrate  25 mg Oral BID  . metroNIDAZOLE  500 mg Oral 3 times per day  . sevelamer carbonate  800 mg Oral TID WC  . sodium chloride  3 mL Intravenous Q12H   acetaminophen **OR** acetaminophen, ondansetron **OR** ondansetron (ZOFRAN) IV  Assessment/ Plan:  Mr. NIK GORRELL is a 64 y.o. black  male  With End stage renal disease, hypertension, diabetes mellitus type II, CVA, coronary artery disease who presents to [facility] on 12/25/2014 for right lower lobe pneumonia. Now admitted to [facility] on  01/23/2015 for C. Diff colitis.   [facility]/ [address] MWF  1. End Stage Renal Disease: hemodialysis yesterday. Tolerated treatment well. Will plan on hemodialysis tomorrow and continue MWF schedule.  - hypokalemia due to GI losses.   2. Hypertension: blood pressure at goal. Continue metoprolol  3. C. Diff colitis: metronidazole. GI symptoms improving  4. Anemia of chronic kidney disease: hemoglobin 7.8 - epo with HD treatment  5. Secondary Hyperparathyroidism: phos 4 - Continue sevelamer    LOS: [place], Brailon Don 6/7/20162:47 PM

## 2015-01-25 NOTE — Care Management Note (Signed)
Patient is active at [place] on MWF schedule. Iran Sizer  [phone]

## 2015-01-26 LAB — RENAL FUNCTION PANEL
ANION GAP: 9 (ref 5–15)
Albumin: 2.5 g/dL — ABNORMAL LOW (ref 3.5–5.0)
BUN: 22 mg/dL — AB (ref 6–20)
CO2: 30 mmol/L (ref 22–32)
Calcium: 8.2 mg/dL — ABNORMAL LOW (ref 8.9–10.3)
Chloride: 100 mmol/L — ABNORMAL LOW (ref 101–111)
Creatinine, Ser: 8.92 mg/dL — ABNORMAL HIGH (ref 0.61–1.24)
GFR calc Af Amer: 7 mL/min — ABNORMAL LOW (ref >60)
GFR calc non Af Amer: 6 mL/min — ABNORMAL LOW (ref >60)
GLUCOSE: 101 mg/dL — AB (ref 65–99)
Phosphorus: 3 mg/dL (ref 2.5–4.6)
Potassium: 3.2 mmol/L — ABNORMAL LOW (ref 3.5–5.1)
Sodium: 139 mmol/L (ref 135–145)

## 2015-01-26 LAB — CBC
HCT: 24.5 % — ABNORMAL LOW (ref 40.0–52.0)
HEMOGLOBIN: 7.6 g/dL — AB (ref 13.0–18.0)
MCH: 26.5 pg (ref 26.0–34.0)
MCHC: 31 g/dL — ABNORMAL LOW (ref 32.0–36.0)
MCV: 85.8 fL (ref 80.0–100.0)
Platelets: 136 10*3/uL — ABNORMAL LOW (ref 150–440)
RBC: 2.86 MIL/uL — ABNORMAL LOW (ref 4.40–5.90)
RDW: 18 % — AB (ref 11.5–14.5)
WBC: 3.8 10*3/uL (ref 3.8–10.6)

## 2015-01-26 LAB — STOOL CULTURE: SPECIAL REQUESTS: NORMAL

## 2015-01-26 LAB — PLATELET COUNT: PLATELETS: 138 10*3/uL — AB (ref 150–440)

## 2015-01-26 LAB — MAGNESIUM: Magnesium: 2 mg/dL (ref 1.7–2.4)

## 2015-01-26 MED ORDER — EPOETIN ALFA 10000 UNIT/ML IJ SOLN
10000.0000 [IU] | Freq: Once | INTRAMUSCULAR | Status: AC
  Administered 2015-01-26: 10000 [IU] via INTRAVENOUS
  Filled 2015-01-26: qty 1

## 2015-01-26 MED ORDER — METRONIDAZOLE 500 MG PO TABS: 500.0000 mg | ORAL_TABLET | Freq: Three times a day (TID) | ORAL | Status: DC

## 2015-01-26 NOTE — Progress Notes (Signed)
[facility]  ROUNDING NOTE   Subjective:   Wife at bedside. Hemodialysis for later today.     Objective:  Vital signs in last 24 hours:  Temp:  [97.6 F (36.4 C)-98.1 F (36.7 C)] 98.1 F (36.7 C) (06/08 0440) Pulse Rate:  [63-84] 67 (06/08 0903) Resp:  [18] 18 (06/08 0440) BP: (108-133)/(60-73) 133/67 mmHg (06/08 0903) SpO2:  [94 %-100 %] 96 % (06/08 0440)  Weight change:  Filed Weights   01/24/15 0032 01/24/15 1430 01/24/15 1815  Weight: 79.334 kg (174 lb 14.4 oz) 84.2 kg (185 lb 10 oz) 83.6 kg (184 lb 4.9 oz)    Intake/Output:     Intake/Output this shift:     Physical Exam: General: NAD  Head: Normocephalic, atraumatic. Moist oral mucosal membranes  Eyes: Anicteric, PERRL  Neck: Supple, trachea midline  Lungs:  Clear to auscultation  Heart: Regular rate and rhythm  Abdomen:  Soft, nontender,   Extremities: no peripheral edema.  Neurologic: Nonfocal, moving all four extremities  Skin: No lesions  Access: AVF    Basic Metabolic Panel:  Recent Labs Lab 01/23/15 1742 01/24/15 1305 01/25/15 0421 01/26/15 0528 01/26/15 0539  NA 137 136 137  --  139  K 3.1* 3.4* 3.3*  --  3.2*  CL 94* 96* 97*  --  100*  CO2 32 30 30  --  30  GLUCOSE 139* 146* 93  --  101*  BUN 25* 30* 17  --  22*  CREATININE 9.77* 10.94* 7.46*  --  8.92*  CALCIUM 8.7* 7.6* 8.1*  --  8.2*  MG  --   --   --  2.0  --   PHOS  --  4.0  --   --  3.0    Liver Function Tests:  Recent Labs Lab 01/24/15 1305 01/26/15 0539  ALBUMIN 2.6* 2.5*   No results for input(s): LIPASE, AMYLASE in the last 168 hours. No results for input(s): AMMONIA in the last 168 hours.  CBC:  Recent Labs Lab 01/23/15 1742 01/26/15 0528 01/26/15 0538  WBC 5.2 3.8  --   HGB 7.8* 7.6*  --   HCT 24.9* 24.5*  --   MCV 86.3 85.8  --   PLT 152 136* 138*    Cardiac Enzymes:  Recent Labs Lab 01/23/15 1742 01/24/15 0104 01/24/15 0637 01/24/15 1305  TROPONINI 0.17* 0.15* 0.14* 0.13*     BNP: Invalid input(s): POCBNP  CBG: No results for input(s): GLUCAP in the last 168 hours.  Microbiology: Results for orders placed or performed during the hospital encounter of 01/23/15  Stool culture     Status: None (Preliminary result)   Collection Time: 01/23/15  8:41 PM  Result Value Ref Range Status   Specimen Description STOOL  Final   Special Requests Normal  Final   Culture   Final    NO SALMONELLA OR SHIGELLA ISOLATED No Pathogenic E. coli detected NO CAMPYLOBACTER DETECTED    Report Status PENDING  Incomplete  C difficile quick scan w PCR reflex [facility])     Status: None   Collection Time: 01/23/15  8:41 PM  Result Value Ref Range Status   C Diff antigen POSITIVE  Final   C Diff toxin POSITIVE  Final   C Diff interpretation   Final    Positive for toxigenic C. difficile, active toxin production present.    Comment: CRITICAL RESULT CALLED TO, READ BACK BY AND VERIFIED WITH: luis flores @ 2306 6.5.16 mpg  Coagulation Studies: No results for input(s): LABPROT, INR in the last 72 hours.  Urinalysis: No results for input(s): COLORURINE, LABSPEC, PHURINE, GLUCOSEU, HGBUR, BILIRUBINUR, KETONESUR, PROTEINUR, UROBILINOGEN, NITRITE, LEUKOCYTESUR in the last 72 hours.  Invalid input(s): APPERANCEUR    Imaging: No results found.   Medications:   . sodium chloride 50 mL/hr at 01/26/15 0028   . aspirin  325 mg Oral Daily  . atorvastatin  10 mg Oral Daily  . epoetin (EPOGEN/PROCRIT) injection  10,000 Units Intravenous Once  . heparin  5,000 Units Subcutaneous 3 times per day  . metoprolol tartrate  25 mg Oral BID  . metroNIDAZOLE  500 mg Oral 3 times per day  . sevelamer carbonate  800 mg Oral TID WC  . sodium chloride  3 mL Intravenous Q12H   acetaminophen **OR** acetaminophen, ondansetron **OR** ondansetron (ZOFRAN) IV  Assessment/ Plan:  [name] is a 64 y.o. black  male  With End stage renal disease, hypertension, diabetes mellitus type  II, CVA, coronary artery disease who presents to [facility] on 12/25/2014 for right lower lobe pneumonia. Now admitted to [facility] on 01/23/2015 for C. Diff colitis.   [facility]/ [address] MWF  1. End Stage Renal Disease: hemodialysis yesterday. Tolerated treatment well. Will plan on hemodialysis later today and continue MWF schedule.  - hypokalemia due to GI losses. 4 K bath ordered.   2. Hypertension: blood pressure at goal. Continue metoprolol  3. C. Diff colitis: metronidazole. GI symptoms improving. No UF on HD.   4. Anemia of chronic kidney disease: hemoglobin 7.8 - epo with HD treatment  5. Secondary Hyperparathyroidism: phos 4 - Continue sevelamer    LOS: 2 Luis Walter 6/8/201610:20 AM

## 2015-01-26 NOTE — Care Management (Signed)
Spoke with [name], representative for [facility]. States that [facility] is following Luis Walter for physical therapy. Resumption of Home Health orders per Dr. Bridgett Larsson Discharge to home after dialysis today per Dr. Bridgett Larsson. Family will transport. Shelbie Ammons RN MSN Care Management [phone]

## 2015-01-26 NOTE — Progress Notes (Signed)
HD START 

## 2015-01-26 NOTE — Plan of Care (Signed)
Problem: Discharge Progression Outcomes Goal: Other Discharge Outcomes/Goals Outcome: Progressing Plan of care progress to goal: VSS. Denies pain/nausea. 2xloose stools during the shift. Plan for dialysis today per nephrologist note. Possible discharge.

## 2015-01-26 NOTE — Discharge Instructions (Signed)
Low sodium, low fat and renal diet Activity as tolerated  Resume HHPT.

## 2015-01-26 NOTE — Progress Notes (Signed)
HD END 

## 2015-01-26 NOTE — Discharge Summary (Addendum)
[place] [facility] [place] [name]    MR#:  [id number]  DATE OF BIRTH:  Sep 26, 1950  DATE OF ADMISSION:  01/23/2015 ADMITTING PHYSICIAN: Lytle Butte, MD  DATE OF DISCHARGE: 01/26/2015  PRIMARY CARE PHYSICIAN: Wilhemena Durie, MD    ADMISSION DIAGNOSIS:  Diarrhea [R19.7] Hypokalemia [E87.6] Troponin level elevated [R79.89] Coronary artery disease involving native coronary artery of native heart without angina pectoris [I25.10]   DISCHARGE DIAGNOSIS:   C. difficile colitis. Hypokalemia SECONDARY DIAGNOSIS:   Past Medical History  Diagnosis Date  . ESRD (end stage renal disease)     a. on HD M,W,F; b. previously on transplant list at [facility] - removed 2016  . DM2 (diabetes mellitus, type 2)   . CAD (coronary artery disease)     a. cardiac cath 2014: LM nl, mLAD 30%, dLAD 30%, ostLCx 50%, mid ramus 20% OM3 80% pRCA 20%, mRCA 20%, PDA 100% w/ left to right collats, medically managed   . Stroke   . Chronic systolic CHF (congestive heart failure)     a. echo 12/2014: EF 35-40%, mod diffuse HK, RWMA cannot be excluded, LV diastolic fxn parameters nl, mild MR, moderately dilated LA, mildly dilated RV internal cavity size, mild TR, PASP nl  . Gallstones     HOSPITAL COURSE:   Luis Walter is a 64 y.o. male with a known history of end-stage renal disease on hemodialysis presenting with persistent nausea/vomiting/diarrhea 3 days. 1. Intractable nausea, vomiting, diarrhea due to C. difficile colitis. The patient has been treated with Flagyl po, diarrhea is much improved. He only has a 1 dose stool today.  2. Elevated troponin: Possible due to ESRD. 3. End-stage renal disease hemodialysis: Treated with hemodialysis.  4. Essential hypertension: On Lopressor 5. Hypokalemia. Adjust K during HD today.  DISCHARGE CONDITIONS:   Stable.  CONSULTS OBTAINED:  Treatment Team:  Lytle Butte, MD Murlean Iba, MD Demetrios Loll,  MD  DRUG ALLERGIES:  No Known Allergies  DISCHARGE MEDICATIONS:   Current Discharge Medication List    START taking these medications   Details  metroNIDAZOLE (FLAGYL) 500 MG tablet Take 1 tablet (500 mg total) by mouth 3 (three) times daily. Qty: 36 tablet, Refills: 0      CONTINUE these medications which have NOT CHANGED   Details  aspirin 325 MG tablet Take 325 mg by mouth daily.    atorvastatin (LIPITOR) 10 MG tablet Take 1 tablet (10 mg total) by mouth daily. Qty: 30 tablet, Refills: 0    metoprolol tartrate (LOPRESSOR) 25 MG tablet Take 1 tablet (25 mg total) by mouth 2 (two) times daily. Qty: 60 tablet, Refills: 0    sevelamer (RENAGEL) 800 MG tablet Take 3,200 mg by mouth 3 (three) times daily with meals.      STOP taking these medications     ciprofloxacin (CIPRO) 500 MG tablet      docusate sodium (COLACE) 100 MG capsule          DISCHARGE INSTRUCTIONS:     If you experience worsening of your admission symptoms, develop shortness of breath, life threatening emergency, suicidal or homicidal thoughts you must seek medical attention immediately by calling 911 or calling your MD immediately  if symptoms less severe.  You Must read complete instructions/literature along with all the possible adverse reactions/side effects for all the Medicines you take and that have been prescribed to you. Take any new Medicines after you have completely understood and  accept all the possible adverse reactions/side effects.   Please note  You were cared for by a hospitalist during your hospital stay. If you have any questions about your discharge medications or the care you received while you were in the hospital after you are discharged, you can call the unit and asked to speak with the hospitalist on call if the hospitalist that took care of you is not available. Once you are discharged, your primary care physician will handle any further medical issues. Please note that NO  REFILLS for any discharge medications will be authorized once you are discharged, as it is imperative that you return to your primary care physician (or establish a relationship with a primary care physician if you do not have one) for your aftercare needs so that they can reassess your need for medications and monitor your lab values.    Today   SUBJECTIVE    One loose stool today. No abd pain, nausea or vomiting.  VITAL SIGNS:  Blood pressure 133/67, pulse 67, temperature 98.1 F (36.7 C), temperature source Oral, resp. rate 18, height 5\' 8"  (1.727 m), weight 83.6 kg (184 lb 4.9 oz), SpO2 96 %.  I/O:  No intake or output data in the 24 hours ending 01/26/15 1311  PHYSICAL EXAMINATION:  GENERAL:  64 y.o.-year-old patient lying in the bed with no acute distress.  EYES: Pupils equal, round, reactive to light and accommodation. No scleral icterus. Extraocular muscles intact.  HEENT: Head atraumatic, normocephalic. Oropharynx and nasopharynx clear.  NECK:  Supple, no jugular venous distention. No thyroid enlargement, no tenderness.  LUNGS: Normal breath sounds bilaterally, no wheezing, rales,rhonchi or crepitation. No use of accessory muscles of respiration.  CARDIOVASCULAR: S1, S2 normal. No murmurs, rubs, or gallops.  ABDOMEN: Soft, non-tender, non-distended. Bowel sounds present. No organomegaly or mass.  EXTREMITIES: No pedal edema, cyanosis, or clubbing.  NEUROLOGIC: Cranial nerves II through XII are intact. Muscle strength 5/5 in all extremities. Sensation intact. Gait not checked.  PSYCHIATRIC: The patient is alert and oriented x 3.  SKIN: No obvious rash, lesion, or ulcer.   DATA REVIEW:   CBC  Recent Labs Lab 01/26/15 0528 01/26/15 0538  WBC 3.8  --   HGB 7.6*  --   HCT 24.5*  --   PLT 136* 138*    Chemistries   Recent Labs Lab 01/26/15 0528 01/26/15 0539  NA  --  139  K  --  3.2*  CL  --  100*  CO2  --  30  GLUCOSE  --  101*  BUN  --  22*  CREATININE   --  8.92*  CALCIUM  --  8.2*  MG 2.0  --     Cardiac Enzymes  Recent Labs Lab 01/24/15 1305  TROPONINI 0.13*    Microbiology Results  Results for orders placed or performed during the hospital encounter of 01/23/15  Stool culture     Status: None   Collection Time: 01/23/15  8:41 PM  Result Value Ref Range Status   Specimen Description STOOL  Final   Special Requests Normal  Final   Culture   Final    NO SALMONELLA OR SHIGELLA ISOLATED No Pathogenic E. coli detected NO CAMPYLOBACTER DETECTED    Report Status 01/26/2015 FINAL  Final  C difficile quick scan w PCR reflex [facility])     Status: None   Collection Time: 01/23/15  8:41 PM  Result Value Ref Range Status   C Diff antigen POSITIVE  Final   C Diff toxin POSITIVE  Final   C Diff interpretation   Final    Positive for toxigenic C. difficile, active toxin production present.    Comment: CRITICAL RESULT CALLED TO, READ BACK BY AND VERIFIED WITH: luis flores @ 2306 6.5.16 mpg     RADIOLOGY:  No results found.      Management plans discussed with the patient, and he is in agreement. D/w Dr. Juleen China, RN and case manager.  CODE STATUS:     Code Status Orders        Start     Ordered   01/23/15 2251  Full code   Continuous     01/23/15 2251      TOTAL TIME TAKING CARE OF THIS PATIENT: 36 minutes.    Demetrios Loll M.D on 01/26/2015 at 1:11 PM  Between 7am to 6pm - Pager - [phone]  After 6pm go to [email or web address] - password [email or web address] [facility]  Office  [phone]  CC: Primary care physician; Wilhemena Durie, MD

## 2015-01-26 NOTE — Progress Notes (Addendum)
ERROR

## 2015-01-26 NOTE — Progress Notes (Signed)
Pt d/ced after dialysis.  Diarrhea is resolving.  Will go home on flagyl.  No c/o pain.  Pt going home w/wife.  No needs expressed.

## 2015-01-26 NOTE — Progress Notes (Signed)
PRE HD   

## 2015-01-26 NOTE — Progress Notes (Signed)
POST HD 

## 2015-01-27 DIAGNOSIS — E119 Type 2 diabetes mellitus without complications: Secondary | ICD-10-CM | POA: Diagnosis not present

## 2015-01-27 DIAGNOSIS — J150 Pneumonia due to Klebsiella pneumoniae: Secondary | ICD-10-CM | POA: Diagnosis not present

## 2015-01-27 DIAGNOSIS — R2681 Unsteadiness on feet: Secondary | ICD-10-CM | POA: Diagnosis not present

## 2015-01-27 DIAGNOSIS — N186 End stage renal disease: Secondary | ICD-10-CM | POA: Diagnosis not present

## 2015-01-27 DIAGNOSIS — I2510 Atherosclerotic heart disease of native coronary artery without angina pectoris: Secondary | ICD-10-CM | POA: Diagnosis not present

## 2015-01-27 DIAGNOSIS — I509 Heart failure, unspecified: Secondary | ICD-10-CM | POA: Diagnosis not present

## 2015-01-28 DIAGNOSIS — N186 End stage renal disease: Secondary | ICD-10-CM | POA: Diagnosis not present

## 2015-01-28 DIAGNOSIS — D631 Anemia in chronic kidney disease: Secondary | ICD-10-CM | POA: Diagnosis not present

## 2015-01-28 DIAGNOSIS — E1129 Type 2 diabetes mellitus with other diabetic kidney complication: Secondary | ICD-10-CM | POA: Diagnosis not present

## 2015-01-28 DIAGNOSIS — N2581 Secondary hyperparathyroidism of renal origin: Secondary | ICD-10-CM | POA: Diagnosis not present

## 2015-01-28 DIAGNOSIS — D509 Iron deficiency anemia, unspecified: Secondary | ICD-10-CM | POA: Diagnosis not present

## 2015-01-31 DIAGNOSIS — N186 End stage renal disease: Secondary | ICD-10-CM | POA: Diagnosis not present

## 2015-01-31 DIAGNOSIS — D509 Iron deficiency anemia, unspecified: Secondary | ICD-10-CM | POA: Diagnosis not present

## 2015-01-31 DIAGNOSIS — N2581 Secondary hyperparathyroidism of renal origin: Secondary | ICD-10-CM | POA: Diagnosis not present

## 2015-01-31 DIAGNOSIS — E1129 Type 2 diabetes mellitus with other diabetic kidney complication: Secondary | ICD-10-CM | POA: Diagnosis not present

## 2015-01-31 DIAGNOSIS — D631 Anemia in chronic kidney disease: Secondary | ICD-10-CM | POA: Diagnosis not present

## 2015-02-01 DIAGNOSIS — N186 End stage renal disease: Secondary | ICD-10-CM | POA: Diagnosis not present

## 2015-02-01 DIAGNOSIS — E119 Type 2 diabetes mellitus without complications: Secondary | ICD-10-CM | POA: Diagnosis not present

## 2015-02-01 DIAGNOSIS — J150 Pneumonia due to Klebsiella pneumoniae: Secondary | ICD-10-CM | POA: Diagnosis not present

## 2015-02-01 DIAGNOSIS — I509 Heart failure, unspecified: Secondary | ICD-10-CM | POA: Diagnosis not present

## 2015-02-01 DIAGNOSIS — R2681 Unsteadiness on feet: Secondary | ICD-10-CM | POA: Diagnosis not present

## 2015-02-01 DIAGNOSIS — I2510 Atherosclerotic heart disease of native coronary artery without angina pectoris: Secondary | ICD-10-CM | POA: Diagnosis not present

## 2015-02-02 DIAGNOSIS — D631 Anemia in chronic kidney disease: Secondary | ICD-10-CM | POA: Diagnosis not present

## 2015-02-02 DIAGNOSIS — N186 End stage renal disease: Secondary | ICD-10-CM | POA: Diagnosis not present

## 2015-02-02 DIAGNOSIS — E1129 Type 2 diabetes mellitus with other diabetic kidney complication: Secondary | ICD-10-CM | POA: Diagnosis not present

## 2015-02-02 DIAGNOSIS — N2581 Secondary hyperparathyroidism of renal origin: Secondary | ICD-10-CM | POA: Diagnosis not present

## 2015-02-02 DIAGNOSIS — D509 Iron deficiency anemia, unspecified: Secondary | ICD-10-CM | POA: Diagnosis not present

## 2015-02-03 DIAGNOSIS — I2510 Atherosclerotic heart disease of native coronary artery without angina pectoris: Secondary | ICD-10-CM | POA: Diagnosis not present

## 2015-02-03 DIAGNOSIS — I509 Heart failure, unspecified: Secondary | ICD-10-CM | POA: Diagnosis not present

## 2015-02-03 DIAGNOSIS — N186 End stage renal disease: Secondary | ICD-10-CM | POA: Diagnosis not present

## 2015-02-03 DIAGNOSIS — R2681 Unsteadiness on feet: Secondary | ICD-10-CM | POA: Diagnosis not present

## 2015-02-03 DIAGNOSIS — J150 Pneumonia due to Klebsiella pneumoniae: Secondary | ICD-10-CM | POA: Diagnosis not present

## 2015-02-03 DIAGNOSIS — E119 Type 2 diabetes mellitus without complications: Secondary | ICD-10-CM | POA: Diagnosis not present

## 2015-02-04 DIAGNOSIS — N186 End stage renal disease: Secondary | ICD-10-CM | POA: Diagnosis not present

## 2015-02-04 DIAGNOSIS — E1129 Type 2 diabetes mellitus with other diabetic kidney complication: Secondary | ICD-10-CM | POA: Diagnosis not present

## 2015-02-04 DIAGNOSIS — D509 Iron deficiency anemia, unspecified: Secondary | ICD-10-CM | POA: Diagnosis not present

## 2015-02-04 DIAGNOSIS — D631 Anemia in chronic kidney disease: Secondary | ICD-10-CM | POA: Diagnosis not present

## 2015-02-04 DIAGNOSIS — N2581 Secondary hyperparathyroidism of renal origin: Secondary | ICD-10-CM | POA: Diagnosis not present

## 2015-02-07 DIAGNOSIS — E1129 Type 2 diabetes mellitus with other diabetic kidney complication: Secondary | ICD-10-CM | POA: Diagnosis not present

## 2015-02-07 DIAGNOSIS — D509 Iron deficiency anemia, unspecified: Secondary | ICD-10-CM | POA: Diagnosis not present

## 2015-02-07 DIAGNOSIS — N186 End stage renal disease: Secondary | ICD-10-CM | POA: Diagnosis not present

## 2015-02-07 DIAGNOSIS — N2581 Secondary hyperparathyroidism of renal origin: Secondary | ICD-10-CM | POA: Diagnosis not present

## 2015-02-07 DIAGNOSIS — D631 Anemia in chronic kidney disease: Secondary | ICD-10-CM | POA: Diagnosis not present

## 2015-02-08 DIAGNOSIS — I2510 Atherosclerotic heart disease of native coronary artery without angina pectoris: Secondary | ICD-10-CM | POA: Diagnosis not present

## 2015-02-08 DIAGNOSIS — R2681 Unsteadiness on feet: Secondary | ICD-10-CM | POA: Diagnosis not present

## 2015-02-08 DIAGNOSIS — J150 Pneumonia due to Klebsiella pneumoniae: Secondary | ICD-10-CM | POA: Diagnosis not present

## 2015-02-08 DIAGNOSIS — N186 End stage renal disease: Secondary | ICD-10-CM | POA: Diagnosis not present

## 2015-02-08 DIAGNOSIS — I509 Heart failure, unspecified: Secondary | ICD-10-CM | POA: Diagnosis not present

## 2015-02-08 DIAGNOSIS — E119 Type 2 diabetes mellitus without complications: Secondary | ICD-10-CM | POA: Diagnosis not present

## 2015-02-09 DIAGNOSIS — E1129 Type 2 diabetes mellitus with other diabetic kidney complication: Secondary | ICD-10-CM | POA: Diagnosis not present

## 2015-02-09 DIAGNOSIS — D509 Iron deficiency anemia, unspecified: Secondary | ICD-10-CM | POA: Diagnosis not present

## 2015-02-09 DIAGNOSIS — N186 End stage renal disease: Secondary | ICD-10-CM | POA: Diagnosis not present

## 2015-02-09 DIAGNOSIS — D631 Anemia in chronic kidney disease: Secondary | ICD-10-CM | POA: Diagnosis not present

## 2015-02-09 DIAGNOSIS — N2581 Secondary hyperparathyroidism of renal origin: Secondary | ICD-10-CM | POA: Diagnosis not present

## 2015-02-11 DIAGNOSIS — N186 End stage renal disease: Secondary | ICD-10-CM | POA: Diagnosis not present

## 2015-02-11 DIAGNOSIS — D509 Iron deficiency anemia, unspecified: Secondary | ICD-10-CM | POA: Diagnosis not present

## 2015-02-11 DIAGNOSIS — D631 Anemia in chronic kidney disease: Secondary | ICD-10-CM | POA: Diagnosis not present

## 2015-02-11 DIAGNOSIS — E1129 Type 2 diabetes mellitus with other diabetic kidney complication: Secondary | ICD-10-CM | POA: Diagnosis not present

## 2015-02-11 DIAGNOSIS — N2581 Secondary hyperparathyroidism of renal origin: Secondary | ICD-10-CM | POA: Diagnosis not present

## 2015-02-14 DIAGNOSIS — D509 Iron deficiency anemia, unspecified: Secondary | ICD-10-CM | POA: Diagnosis not present

## 2015-02-14 DIAGNOSIS — D631 Anemia in chronic kidney disease: Secondary | ICD-10-CM | POA: Diagnosis not present

## 2015-02-14 DIAGNOSIS — N2581 Secondary hyperparathyroidism of renal origin: Secondary | ICD-10-CM | POA: Diagnosis not present

## 2015-02-14 DIAGNOSIS — N186 End stage renal disease: Secondary | ICD-10-CM | POA: Diagnosis not present

## 2015-02-14 DIAGNOSIS — E1129 Type 2 diabetes mellitus with other diabetic kidney complication: Secondary | ICD-10-CM | POA: Diagnosis not present

## 2015-02-15 DIAGNOSIS — E119 Type 2 diabetes mellitus without complications: Secondary | ICD-10-CM | POA: Diagnosis not present

## 2015-02-15 DIAGNOSIS — N186 End stage renal disease: Secondary | ICD-10-CM | POA: Diagnosis not present

## 2015-02-15 DIAGNOSIS — I509 Heart failure, unspecified: Secondary | ICD-10-CM | POA: Diagnosis not present

## 2015-02-15 DIAGNOSIS — J150 Pneumonia due to Klebsiella pneumoniae: Secondary | ICD-10-CM | POA: Diagnosis not present

## 2015-02-15 DIAGNOSIS — I2510 Atherosclerotic heart disease of native coronary artery without angina pectoris: Secondary | ICD-10-CM | POA: Diagnosis not present

## 2015-02-15 DIAGNOSIS — R2681 Unsteadiness on feet: Secondary | ICD-10-CM | POA: Diagnosis not present

## 2015-02-16 DIAGNOSIS — N2581 Secondary hyperparathyroidism of renal origin: Secondary | ICD-10-CM | POA: Diagnosis not present

## 2015-02-16 DIAGNOSIS — D509 Iron deficiency anemia, unspecified: Secondary | ICD-10-CM | POA: Diagnosis not present

## 2015-02-16 DIAGNOSIS — N186 End stage renal disease: Secondary | ICD-10-CM | POA: Diagnosis not present

## 2015-02-16 DIAGNOSIS — E1129 Type 2 diabetes mellitus with other diabetic kidney complication: Secondary | ICD-10-CM | POA: Diagnosis not present

## 2015-02-16 DIAGNOSIS — D631 Anemia in chronic kidney disease: Secondary | ICD-10-CM | POA: Diagnosis not present

## 2015-02-17 DIAGNOSIS — N186 End stage renal disease: Secondary | ICD-10-CM | POA: Diagnosis not present

## 2015-02-17 DIAGNOSIS — Z992 Dependence on renal dialysis: Secondary | ICD-10-CM | POA: Diagnosis not present

## 2015-02-17 DIAGNOSIS — E1122 Type 2 diabetes mellitus with diabetic chronic kidney disease: Secondary | ICD-10-CM | POA: Diagnosis not present

## 2015-02-18 DIAGNOSIS — N186 End stage renal disease: Secondary | ICD-10-CM | POA: Diagnosis not present

## 2015-02-18 DIAGNOSIS — E1129 Type 2 diabetes mellitus with other diabetic kidney complication: Secondary | ICD-10-CM | POA: Diagnosis not present

## 2015-02-18 DIAGNOSIS — N2581 Secondary hyperparathyroidism of renal origin: Secondary | ICD-10-CM | POA: Diagnosis not present

## 2015-02-18 DIAGNOSIS — D631 Anemia in chronic kidney disease: Secondary | ICD-10-CM | POA: Diagnosis not present

## 2015-02-21 DIAGNOSIS — D631 Anemia in chronic kidney disease: Secondary | ICD-10-CM | POA: Diagnosis not present

## 2015-02-21 DIAGNOSIS — N2581 Secondary hyperparathyroidism of renal origin: Secondary | ICD-10-CM | POA: Diagnosis not present

## 2015-02-21 DIAGNOSIS — E1129 Type 2 diabetes mellitus with other diabetic kidney complication: Secondary | ICD-10-CM | POA: Diagnosis not present

## 2015-02-21 DIAGNOSIS — N186 End stage renal disease: Secondary | ICD-10-CM | POA: Diagnosis not present

## 2015-02-23 DIAGNOSIS — N2581 Secondary hyperparathyroidism of renal origin: Secondary | ICD-10-CM | POA: Diagnosis not present

## 2015-02-23 DIAGNOSIS — N186 End stage renal disease: Secondary | ICD-10-CM | POA: Diagnosis not present

## 2015-02-23 DIAGNOSIS — E1129 Type 2 diabetes mellitus with other diabetic kidney complication: Secondary | ICD-10-CM | POA: Diagnosis not present

## 2015-02-23 DIAGNOSIS — D631 Anemia in chronic kidney disease: Secondary | ICD-10-CM | POA: Diagnosis not present

## 2015-02-24 DIAGNOSIS — K8020 Calculus of gallbladder without cholecystitis without obstruction: Secondary | ICD-10-CM | POA: Diagnosis not present

## 2015-02-24 DIAGNOSIS — D369 Benign neoplasm, unspecified site: Secondary | ICD-10-CM | POA: Diagnosis not present

## 2015-02-24 DIAGNOSIS — K811 Chronic cholecystitis: Secondary | ICD-10-CM | POA: Diagnosis not present

## 2015-02-24 DIAGNOSIS — R197 Diarrhea, unspecified: Secondary | ICD-10-CM | POA: Diagnosis not present

## 2015-02-25 DIAGNOSIS — E1129 Type 2 diabetes mellitus with other diabetic kidney complication: Secondary | ICD-10-CM | POA: Diagnosis not present

## 2015-02-25 DIAGNOSIS — D631 Anemia in chronic kidney disease: Secondary | ICD-10-CM | POA: Diagnosis not present

## 2015-02-25 DIAGNOSIS — N2581 Secondary hyperparathyroidism of renal origin: Secondary | ICD-10-CM | POA: Diagnosis not present

## 2015-02-25 DIAGNOSIS — N186 End stage renal disease: Secondary | ICD-10-CM | POA: Diagnosis not present

## 2015-02-28 DIAGNOSIS — N2581 Secondary hyperparathyroidism of renal origin: Secondary | ICD-10-CM | POA: Diagnosis not present

## 2015-02-28 DIAGNOSIS — D631 Anemia in chronic kidney disease: Secondary | ICD-10-CM | POA: Diagnosis not present

## 2015-02-28 DIAGNOSIS — N186 End stage renal disease: Secondary | ICD-10-CM | POA: Diagnosis not present

## 2015-02-28 DIAGNOSIS — E1129 Type 2 diabetes mellitus with other diabetic kidney complication: Secondary | ICD-10-CM | POA: Diagnosis not present

## 2015-03-01 ENCOUNTER — Emergency Department: Payer: Medicare Other

## 2015-03-01 ENCOUNTER — Emergency Department
Admission: EM | Admit: 2015-03-01 | Discharge: 2015-03-02 | Disposition: A | Discharge status: Home / Self Care | Payer: Medicare Other | Attending: Emergency Medicine | Admitting: Emergency Medicine

## 2015-03-01 ENCOUNTER — Other Ambulatory Visit: Payer: Self-pay

## 2015-03-01 ENCOUNTER — Encounter: Payer: Self-pay | Admitting: Emergency Medicine

## 2015-03-01 DIAGNOSIS — R51 Headache: Secondary | ICD-10-CM | POA: Diagnosis not present

## 2015-03-01 DIAGNOSIS — G9389 Other specified disorders of brain: Secondary | ICD-10-CM | POA: Diagnosis not present

## 2015-03-01 DIAGNOSIS — Z992 Dependence on renal dialysis: Secondary | ICD-10-CM | POA: Diagnosis not present

## 2015-03-01 DIAGNOSIS — Z87891 Personal history of nicotine dependence: Secondary | ICD-10-CM | POA: Diagnosis not present

## 2015-03-01 DIAGNOSIS — Z79899 Other long term (current) drug therapy: Secondary | ICD-10-CM | POA: Insufficient documentation

## 2015-03-01 DIAGNOSIS — Z7982 Long term (current) use of aspirin: Secondary | ICD-10-CM | POA: Diagnosis not present

## 2015-03-01 DIAGNOSIS — I120 Hypertensive chronic kidney disease with stage 5 chronic kidney disease or end stage renal disease: Secondary | ICD-10-CM | POA: Insufficient documentation

## 2015-03-01 DIAGNOSIS — M542 Cervicalgia: Secondary | ICD-10-CM | POA: Insufficient documentation

## 2015-03-01 DIAGNOSIS — I517 Cardiomegaly: Secondary | ICD-10-CM | POA: Diagnosis not present

## 2015-03-01 DIAGNOSIS — N186 End stage renal disease: Secondary | ICD-10-CM | POA: Insufficient documentation

## 2015-03-01 DIAGNOSIS — R519 Headache, unspecified: Secondary | ICD-10-CM

## 2015-03-01 DIAGNOSIS — E119 Type 2 diabetes mellitus without complications: Secondary | ICD-10-CM | POA: Insufficient documentation

## 2015-03-01 LAB — TROPONIN I: TROPONIN I: 0.09 ng/mL — AB (ref <0.031)

## 2015-03-01 LAB — BASIC METABOLIC PANEL
ANION GAP: 10 (ref 5–15)
BUN: 20 mg/dL (ref 6–20)
CALCIUM: 9 mg/dL (ref 8.9–10.3)
CO2: 31 mmol/L (ref 22–32)
CREATININE: 7.98 mg/dL — AB (ref 0.61–1.24)
Chloride: 96 mmol/L — ABNORMAL LOW (ref 101–111)
GFR calc Af Amer: 7 mL/min — ABNORMAL LOW (ref >60)
GFR calc non Af Amer: 6 mL/min — ABNORMAL LOW (ref >60)
Glucose, Bld: 155 mg/dL — ABNORMAL HIGH (ref 65–99)
Potassium: 3.2 mmol/L — ABNORMAL LOW (ref 3.5–5.1)
Sodium: 137 mmol/L (ref 135–145)

## 2015-03-01 LAB — CBC
HCT: 32.5 % — ABNORMAL LOW (ref 40.0–52.0)
Hemoglobin: 10.3 g/dL — ABNORMAL LOW (ref 13.0–18.0)
MCH: 27.9 pg (ref 26.0–34.0)
MCHC: 31.7 g/dL — AB (ref 32.0–36.0)
MCV: 88.1 fL (ref 80.0–100.0)
PLATELETS: 160 10*3/uL (ref 150–440)
RBC: 3.69 MIL/uL — ABNORMAL LOW (ref 4.40–5.90)
RDW: 18 % — AB (ref 11.5–14.5)
WBC: 4.9 10*3/uL (ref 3.8–10.6)

## 2015-03-01 MED ORDER — CYCLOBENZAPRINE HCL 10 MG PO TABS
5.0000 mg | ORAL_TABLET | Freq: Once | ORAL | Status: AC
  Administered 2015-03-01: 5 mg via ORAL
  Filled 2015-03-01: qty 1

## 2015-03-01 NOTE — ED Notes (Signed)
Pt 88-90% o2sat on monitor with good waveform.  2L [place] applied, o2sat 95% currently.

## 2015-03-01 NOTE — ED Notes (Signed)
Pt presents to ER alert and in NAD. Pt is a dialysis pt. Pt reports h/a today. Wife states gait is unsteady, reports onset around 6pm.

## 2015-03-01 NOTE — ED Provider Notes (Signed)
[facility] Emergency Department Provider Note  ____________________________________________  Time seen: Approximately 1114 PM  I have reviewed the triage vital signs and the nursing notes.   HISTORY  Chief Complaint Neck Pain and Headache    HPI Luis Walter is a 64 y.o. male who comes in with a headache today. The patient reports that the headache was so intense that he was moaning in pain. The headache started this afternoon and the patient was given aspirin by his wife. He reports that it was at the top of his head and after the aspirin the pain at the top of his head improved but it returned at the back of his head his neck and his shoulders. The patient reports that he hardly ever gets headaches and reports that currently the headache is gone but he does still have some neck and shoulder tightness that is a 4-5 out of 10 in intensity. The patient reports that he was recently here for pneumonia and does not use any oxygen at home. He reports that he has never had a headache this bad before but again it is gone currently. The patient denies any chest pain shortness of breath nausea vomiting abdominal pain. He does have a history of a stroke in the past. The patient's wife reports that he came in because he wanted to be checked out.   Past Medical History  Diagnosis Date  . ESRD (end stage renal disease)     a. on HD M,W,F; b. previously on transplant list at [facility] - removed 2016  . DM2 (diabetes mellitus, type 2)   . CAD (coronary artery disease)     a. cardiac cath 2014: LM nl, mLAD 30%, dLAD 30%, ostLCx 50%, mid ramus 20% OM3 80% pRCA 20%, mRCA 20%, PDA 100% w/ left to right collats, medically managed   . Stroke   . Chronic systolic CHF (congestive heart failure)     a. echo 12/2014: EF 35-40%, mod diffuse HK, RWMA cannot be excluded, LV diastolic fxn parameters nl, mild MR, moderately dilated LA, mildly dilated RV internal cavity size, mild TR, PASP nl  .  Gallstones    hypertension  Patient Active Problem List   Diagnosis Date Noted  . Clostridium difficile diarrhea 01/24/2015  . Intractable nausea and vomiting 01/23/2015  . Elevated troponin 01/23/2015  . Chronic systolic CHF (congestive heart failure)   . CAD (coronary artery disease)   . Pneumonia 12/25/2014  . ESRD (end stage renal disease) 12/25/2014  . DM (diabetes mellitus) 12/25/2014  . CVA (cerebral infarction) 12/25/2014    History reviewed. No pertinent past surgical history.  Current Outpatient Rx  Name  Route  Sig  Dispense  Refill  . aspirin 325 MG tablet   Oral   Take 325 mg by mouth daily.         Marland Kitchen atorvastatin (LIPITOR) 10 MG tablet   Oral   Take 1 tablet (10 mg total) by mouth daily.   30 tablet   0   . metoprolol tartrate (LOPRESSOR) 25 MG tablet   Oral   Take 1 tablet (25 mg total) by mouth 2 (two) times daily.   60 tablet   0   . sevelamer (RENAGEL) 800 MG tablet   Oral   Take 3,200 mg by mouth 3 (three) times daily with meals.         . cyclobenzaprine (FLEXERIL) 10 MG tablet   Oral   Take 1 tablet (10 mg total) by  mouth every 8 (eight) hours as needed for muscle spasms.   15 tablet   0   . metroNIDAZOLE (FLAGYL) 500 MG tablet   Oral   Take 1 tablet (500 mg total) by mouth 3 (three) times daily.   36 tablet   0     Allergies Review of patient's allergies indicates no known allergies.  Family History  Problem Relation Age of Onset  . Hypertension    . Diabetes      Social History History  Substance Use Topics  . Smoking status: Former Research scientist (life sciences)  . Smokeless tobacco: Not on file  . Alcohol Use: No    Review of Systems Constitutional: No fever/chills Eyes: No visual changes. ENT: No sore throat. Cardiovascular: Denies chest pain. Respiratory: Denies shortness of breath. Gastrointestinal: No abdominal pain.  No nausea, no vomiting.  No diarrhea.  No constipation. Genitourinary: Negative for dysuria. Musculoskeletal:  Neck and shoulder stiffness Skin: Negative for rash. Neurological: Headache  10-point ROS otherwise negative.  ____________________________________________   PHYSICAL EXAM:  VITAL SIGNS: ED Triage Vitals  Enc Vitals Group     BP 03/01/15 2109 127/74 mmHg     Pulse Rate 03/01/15 2109 78     Resp 03/01/15 2109 20     Temp 03/01/15 2109 98.5 F (36.9 C)     Temp Source 03/01/15 2109 Oral     SpO2 03/01/15 2109 96 %     Weight 03/01/15 2110 185 lb (83.915 kg)     Height 03/01/15 2110 5\' 8"  (1.727 m)     Head Cir --      Peak Flow --      Pain Score 03/01/15 2231 7     Pain Loc --      Pain Edu? --      Excl. in [place]? --     Constitutional: Alert and oriented. Well appearing and in no acute distress. Eyes: Conjunctivae are normal. PERRL. EOMI. Head: Atraumatic. Nose: No congestion/rhinnorhea. Mouth/Throat: Mucous membranes are moist.  Oropharynx non-erythematous. Neck: Nontender to palpation Hematological/Lymphatic/Immunilogical: No cervical lymphadenopathy. Cardiovascular: Normal rate, regular rhythm. Grossly normal heart sounds.  Good peripheral circulation. Respiratory: Normal respiratory effort.  No retractions. Lungs CTAB. Gastrointestinal: Soft and nontender. No distention. Positive bowel sounds Genitourinary: Deferred Musculoskeletal: No lower extremity tenderness nor edema.   Neurologic:  Normal speech and language. No gross focal neurologic deficits are appreciated. Cranial nerves II through XII are grossly intact Skin:  Skin is warm, dry and intact. No rash noted. Psychiatric: Mood and affect are normal. Speech and behavior are normal.  ____________________________________________   LABS (all labs ordered are listed, but only abnormal results are displayed)  Labs Reviewed  BASIC METABOLIC PANEL - Abnormal; Notable for the following:    Potassium 3.2 (*)    Chloride 96 (*)    Glucose, Bld 155 (*)    Creatinine, Ser 7.98 (*)    GFR calc non Af Amer 6 (*)     GFR calc Af Amer 7 (*)    All other components within normal limits  CBC - Abnormal; Notable for the following:    RBC 3.69 (*)    Hemoglobin 10.3 (*)    HCT 32.5 (*)    MCHC 31.7 (*)    RDW 18.0 (*)    All other components within normal limits  TROPONIN I - Abnormal; Notable for the following:    Troponin I 0.09 (*)    All other components within normal limits  TROPONIN I -  Abnormal; Notable for the following:    Troponin I 0.09 (*)    All other components within normal limits   ____________________________________________  EKG  ED ECG REPORT I, Loney Hering, the attending physician, personally viewed and interpreted this ECG.   Date: 03/01/2015  EKG Time: 2121  Rate: 80  Rhythm: normal sinus rhythm  Axis: normal  Intervals:none  ST&T Change: Flipped T waves in leads 1 and aVL as well as leads 2 V5 and V6.  ____________________________________________  RADIOLOGY  CT head: Bilateral encephalomalacia consistent with old infarctions, no acute intracranial abnormality seen Chest x-ray: No active cardiopulmonary disease ____________________________________________   PROCEDURES  Procedure(s) performed: None  Critical Care performed: No  ____________________________________________   INITIAL IMPRESSION / ASSESSMENT AND PLAN / ED COURSE  Pertinent labs & imaging results that were available during my care of the patient were reviewed by me and considered in my medical decision making (see chart for details).  This is a 64 year old male who comes in today with a headache. He reports that the headache did improve after some aspirin but is having some tightness and stiffness in her shoulders and his neck. The patient's CT does not show any acute injury at this time I will give the patient some Flexeril as well as perform a chest x-ray as he does have some hypoxia while here in the emergency department and he was recently admitted for pneumonia. I'll reassess the  patient once he's had his medications.  ----------------------------------------- 2:46 AM on 03/02/2015 -----------------------------------------  The patient did not have a headache when he arrived to the hospital. The patient was complaining of tightness in his neck and his shoulders. I did give the patient a dose of Flexeril and his pain was relieved. The patient's blood work did show a mildly elevated troponin which is the patient's baseline and the repeat did not change. At this time I feel the patient can be discharged home to follow-up with his primary care physician. The patient and his wife report that they're ready to be discharged and they'll follow up with his doctor. I will give the patient some Flexeril for home as well. ____________________________________________   FINAL CLINICAL IMPRESSION(S) / ED DIAGNOSES  Final diagnoses:  Acute nonintractable headache, unspecified headache type  Muscle pain, cervical      Loney Hering, MD 03/02/15 [phone]

## 2015-03-01 NOTE — ED Notes (Signed)
Charge RN aware of Trop

## 2015-03-01 NOTE — ED Notes (Signed)
Pt reports pain to back of head and neck x 1 day, described as sharp and intermittent.  Wife reports pt has had trouble with balance.  Pt denies sensitivity to light or blurred vision.  Pt denies n/v.  Pt NAD at this time.

## 2015-03-02 DIAGNOSIS — N186 End stage renal disease: Secondary | ICD-10-CM | POA: Diagnosis not present

## 2015-03-02 DIAGNOSIS — N2581 Secondary hyperparathyroidism of renal origin: Secondary | ICD-10-CM | POA: Diagnosis not present

## 2015-03-02 DIAGNOSIS — E1129 Type 2 diabetes mellitus with other diabetic kidney complication: Secondary | ICD-10-CM | POA: Diagnosis not present

## 2015-03-02 DIAGNOSIS — D631 Anemia in chronic kidney disease: Secondary | ICD-10-CM | POA: Diagnosis not present

## 2015-03-02 LAB — TROPONIN I: Troponin I: 0.09 ng/mL — ABNORMAL HIGH (ref <0.031)

## 2015-03-02 MED ORDER — CYCLOBENZAPRINE HCL 10 MG PO TABS: 10.0000 mg | ORAL_TABLET | Freq: Three times a day (TID) | ORAL | Status: DC | PRN

## 2015-03-02 NOTE — Discharge Instructions (Signed)
General Headache Without Cause A headache is pain or discomfort felt around the head or neck area. The specific cause of a headache may not be found. There are many causes and types of headaches. A few common ones are:  Tension headaches.  Migraine headaches.  Cluster headaches.  Chronic daily headaches. HOME CARE INSTRUCTIONS   Keep all follow-up appointments with your caregiver or any specialist referral.  Only take over-the-counter or prescription medicines for pain or discomfort as directed by your caregiver.  Lie down in a dark, quiet room when you have a headache.  Keep a headache journal to find out what may trigger your migraine headaches. For example, write down:  What you eat and drink.  How much sleep you get.  Any change to your diet or medicines.  Try massage or other relaxation techniques.  Put ice packs or heat on the head and neck. Use these 3 to 4 times per day for 15 to 20 minutes each time, or as needed.  Limit stress.  Sit up straight, and do not tense your muscles.  Quit smoking if you smoke.  Limit alcohol use.  Decrease the amount of caffeine you drink, or stop drinking caffeine.  Eat and sleep on a regular schedule.  Get 7 to 9 hours of sleep, or as recommended by your caregiver.  Keep lights dim if bright lights bother you and make your headaches worse. SEEK MEDICAL CARE IF:   You have problems with the medicines you were prescribed.  Your medicines are not working.  You have a change from the usual headache.  You have nausea or vomiting. SEEK IMMEDIATE MEDICAL CARE IF:   Your headache becomes severe.  You have a fever.  You have a stiff neck.  You have loss of vision.  You have muscular weakness or loss of muscle control.  You start losing your balance or have trouble walking.  You feel faint or pass out.  You have severe symptoms that are different from your first symptoms. MAKE SURE YOU:   Understand these  instructions.  Will watch your condition.  Will get help right away if you are not doing well or get worse. Document Released: 08/06/2005 Document Revised: 10/29/2011 Document Reviewed: 08/22/2011 [facility] Patient Information 2015 [place], [place]. This information is not intended to replace advice given to you by your health care provider. Make sure you discuss any questions you have with your health care provider.  Musculoskeletal Pain Musculoskeletal pain is muscle and boney aches and pains. These pains can occur in any part of the body. Your caregiver may treat you without knowing the cause of the pain. They may treat you if blood or urine tests, X-rays, and other tests were normal.  CAUSES There is often not a definite cause or reason for these pains. These pains may be caused by a type of germ (virus). The discomfort may also come from overuse. Overuse includes working out too hard when your body is not fit. Boney aches also come from weather changes. Bone is sensitive to atmospheric pressure changes. HOME CARE INSTRUCTIONS   Ask when your test results will be ready. Make sure you get your test results.  Only take over-the-counter or prescription medicines for pain, discomfort, or fever as directed by your caregiver. If you were given medications for your condition, do not drive, operate machinery or power tools, or sign legal documents for 24 hours. Do not drink alcohol. Do not take sleeping pills or other medications that may  interfere with treatment.  Continue all activities unless the activities cause more pain. When the pain lessens, slowly resume normal activities. Gradually increase the intensity and duration of the activities or exercise.  During periods of severe pain, bed rest may be helpful. Lay or sit in any position that is comfortable.  Putting ice on the injured area.  Put ice in a bag.  Place a towel between your skin and the bag.  Leave the ice on for 15 to 20  minutes, 3 to 4 times a day.  Follow up with your caregiver for continued problems and no reason can be found for the pain. If the pain becomes worse or does not go away, it may be necessary to repeat tests or do additional testing. Your caregiver may need to look further for a possible cause. SEEK IMMEDIATE MEDICAL CARE IF:  You have pain that is getting worse and is not relieved by medications.  You develop chest pain that is associated with shortness or breath, sweating, feeling sick to your stomach (nauseous), or throw up (vomit).  Your pain becomes localized to the abdomen.  You develop any new symptoms that seem different or that concern you. MAKE SURE YOU:   Understand these instructions.  Will watch your condition.  Will get help right away if you are not doing well or get worse. Document Released: 08/06/2005 Document Revised: 10/29/2011 Document Reviewed: 04/10/2013 [facility] Patient Information 2015 [place], [place]. This information is not intended to replace advice given to you by your health care provider. Make sure you discuss any questions you have with your health care provider.

## 2015-03-03 DIAGNOSIS — N186 End stage renal disease: Secondary | ICD-10-CM | POA: Diagnosis not present

## 2015-03-03 DIAGNOSIS — E119 Type 2 diabetes mellitus without complications: Secondary | ICD-10-CM | POA: Diagnosis not present

## 2015-03-03 DIAGNOSIS — Z7982 Long term (current) use of aspirin: Secondary | ICD-10-CM | POA: Diagnosis not present

## 2015-03-03 DIAGNOSIS — M542 Cervicalgia: Secondary | ICD-10-CM | POA: Diagnosis not present

## 2015-03-03 DIAGNOSIS — Z79899 Other long term (current) drug therapy: Secondary | ICD-10-CM | POA: Diagnosis not present

## 2015-03-03 DIAGNOSIS — I2510 Atherosclerotic heart disease of native coronary artery without angina pectoris: Secondary | ICD-10-CM | POA: Diagnosis not present

## 2015-03-03 DIAGNOSIS — I120 Hypertensive chronic kidney disease with stage 5 chronic kidney disease or end stage renal disease: Secondary | ICD-10-CM | POA: Diagnosis not present

## 2015-03-04 ENCOUNTER — Observation Stay
Admission: EM | Admit: 2015-03-04 | Discharge: 2015-03-07 | Disposition: A | Discharge status: Home / Self Care | Payer: Medicare Other | Attending: Internal Medicine | Admitting: Internal Medicine

## 2015-03-04 ENCOUNTER — Encounter: Payer: Self-pay | Admitting: Emergency Medicine

## 2015-03-04 ENCOUNTER — Emergency Department: Payer: Medicare Other

## 2015-03-04 DIAGNOSIS — N186 End stage renal disease: Secondary | ICD-10-CM | POA: Diagnosis not present

## 2015-03-04 DIAGNOSIS — I2510 Atherosclerotic heart disease of native coronary artery without angina pectoris: Secondary | ICD-10-CM | POA: Diagnosis not present

## 2015-03-04 DIAGNOSIS — R05 Cough: Secondary | ICD-10-CM | POA: Diagnosis not present

## 2015-03-04 DIAGNOSIS — I10 Essential (primary) hypertension: Secondary | ICD-10-CM | POA: Insufficient documentation

## 2015-03-04 DIAGNOSIS — R4182 Altered mental status, unspecified: Secondary | ICD-10-CM | POA: Insufficient documentation

## 2015-03-04 DIAGNOSIS — R0989 Other specified symptoms and signs involving the circulatory and respiratory systems: Secondary | ICD-10-CM | POA: Insufficient documentation

## 2015-03-04 DIAGNOSIS — Z87891 Personal history of nicotine dependence: Secondary | ICD-10-CM | POA: Diagnosis not present

## 2015-03-04 DIAGNOSIS — Z992 Dependence on renal dialysis: Secondary | ICD-10-CM | POA: Insufficient documentation

## 2015-03-04 DIAGNOSIS — M542 Cervicalgia: Secondary | ICD-10-CM | POA: Diagnosis not present

## 2015-03-04 DIAGNOSIS — R7989 Other specified abnormal findings of blood chemistry: Secondary | ICD-10-CM

## 2015-03-04 DIAGNOSIS — D631 Anemia in chronic kidney disease: Secondary | ICD-10-CM | POA: Insufficient documentation

## 2015-03-04 DIAGNOSIS — M609 Myositis, unspecified: Principal | ICD-10-CM | POA: Insufficient documentation

## 2015-03-04 DIAGNOSIS — E1121 Type 2 diabetes mellitus with diabetic nephropathy: Secondary | ICD-10-CM | POA: Diagnosis not present

## 2015-03-04 DIAGNOSIS — R262 Difficulty in walking, not elsewhere classified: Secondary | ICD-10-CM

## 2015-03-04 DIAGNOSIS — I5022 Chronic systolic (congestive) heart failure: Secondary | ICD-10-CM | POA: Diagnosis not present

## 2015-03-04 DIAGNOSIS — R778 Other specified abnormalities of plasma proteins: Secondary | ICD-10-CM

## 2015-03-04 DIAGNOSIS — R509 Fever, unspecified: Secondary | ICD-10-CM | POA: Insufficient documentation

## 2015-03-04 DIAGNOSIS — R748 Abnormal levels of other serum enzymes: Secondary | ICD-10-CM | POA: Insufficient documentation

## 2015-03-04 DIAGNOSIS — E1129 Type 2 diabetes mellitus with other diabetic kidney complication: Secondary | ICD-10-CM | POA: Diagnosis not present

## 2015-03-04 DIAGNOSIS — I120 Hypertensive chronic kidney disease with stage 5 chronic kidney disease or end stage renal disease: Secondary | ICD-10-CM | POA: Diagnosis not present

## 2015-03-04 DIAGNOSIS — I517 Cardiomegaly: Secondary | ICD-10-CM | POA: Diagnosis not present

## 2015-03-04 DIAGNOSIS — R531 Weakness: Secondary | ICD-10-CM

## 2015-03-04 DIAGNOSIS — R51 Headache: Secondary | ICD-10-CM | POA: Diagnosis not present

## 2015-03-04 DIAGNOSIS — J811 Chronic pulmonary edema: Secondary | ICD-10-CM | POA: Diagnosis not present

## 2015-03-04 DIAGNOSIS — R519 Headache, unspecified: Secondary | ICD-10-CM

## 2015-03-04 DIAGNOSIS — Z8673 Personal history of transient ischemic attack (TIA), and cerebral infarction without residual deficits: Secondary | ICD-10-CM | POA: Insufficient documentation

## 2015-03-04 DIAGNOSIS — R749 Abnormal serum enzyme level, unspecified: Secondary | ICD-10-CM | POA: Diagnosis not present

## 2015-03-04 DIAGNOSIS — N2581 Secondary hyperparathyroidism of renal origin: Secondary | ICD-10-CM | POA: Diagnosis not present

## 2015-03-04 HISTORY — DX: Dependence on renal dialysis: Z99.2

## 2015-03-04 LAB — COMPREHENSIVE METABOLIC PANEL
ALT: 10 U/L — AB (ref 17–63)
ANION GAP: 10 (ref 5–15)
AST: 21 U/L (ref 15–41)
Albumin: 3 g/dL — ABNORMAL LOW (ref 3.5–5.0)
Alkaline Phosphatase: 68 U/L (ref 38–126)
BILIRUBIN TOTAL: 1.5 mg/dL — AB (ref 0.3–1.2)
BUN: 16 mg/dL (ref 6–20)
CO2: 35 mmol/L — ABNORMAL HIGH (ref 22–32)
Calcium: 8.8 mg/dL — ABNORMAL LOW (ref 8.9–10.3)
Chloride: 93 mmol/L — ABNORMAL LOW (ref 101–111)
Creatinine, Ser: 5.61 mg/dL — ABNORMAL HIGH (ref 0.61–1.24)
GFR calc Af Amer: 11 mL/min — ABNORMAL LOW (ref >60)
GFR calc non Af Amer: 10 mL/min — ABNORMAL LOW (ref >60)
Glucose, Bld: 109 mg/dL — ABNORMAL HIGH (ref 65–99)
Potassium: 3.6 mmol/L (ref 3.5–5.1)
SODIUM: 138 mmol/L (ref 135–145)
Total Protein: 6.8 g/dL (ref 6.5–8.1)

## 2015-03-04 LAB — CBC WITH DIFFERENTIAL/PLATELET
BASOS ABS: 0.1 10*3/uL (ref 0–0.1)
BASOS PCT: 1 %
EOS ABS: 0 10*3/uL (ref 0–0.7)
Eosinophils Relative: 0 %
HCT: 35.6 % — ABNORMAL LOW (ref 40.0–52.0)
Hemoglobin: 11.3 g/dL — ABNORMAL LOW (ref 13.0–18.0)
Lymphocytes Relative: 16 %
Lymphs Abs: 1 10*3/uL (ref 1.0–3.6)
MCH: 27.6 pg (ref 26.0–34.0)
MCHC: 31.7 g/dL — ABNORMAL LOW (ref 32.0–36.0)
MCV: 87.1 fL (ref 80.0–100.0)
MONO ABS: 0.7 10*3/uL (ref 0.2–1.0)
MONOS PCT: 11 %
Neutro Abs: 4.5 10*3/uL (ref 1.4–6.5)
Neutrophils Relative %: 72 %
Platelets: 132 10*3/uL — ABNORMAL LOW (ref 150–440)
RBC: 4.09 MIL/uL — ABNORMAL LOW (ref 4.40–5.90)
RDW: 17.9 % — ABNORMAL HIGH (ref 11.5–14.5)
WBC: 6.3 10*3/uL (ref 3.8–10.6)

## 2015-03-04 LAB — TROPONIN I
TROPONIN I: 0.29 ng/mL — AB (ref <0.031)
Troponin I: 0.28 ng/mL — ABNORMAL HIGH (ref <0.031)

## 2015-03-04 LAB — LACTIC ACID, PLASMA
LACTIC ACID, VENOUS: 1.8 mmol/L (ref 0.5–2.0)
Lactic Acid, Venous: 1.4 mmol/L (ref 0.5–2.0)

## 2015-03-04 LAB — GLUCOSE, CAPILLARY: GLUCOSE-CAPILLARY: 111 mg/dL — AB (ref 65–99)

## 2015-03-04 LAB — CSF CELL COUNT WITH DIFFERENTIAL
Eosinophils, CSF: 0 %
LYMPHS CSF: 0 %
MONOCYTE-MACROPHAGE-SPINAL FLUID: 0 %
Other Cells, CSF: 0
RBC Count, CSF: 431 /mm3 — ABNORMAL HIGH (ref 0–3)
SEGMENTED NEUTROPHILS-CSF: 0 %
Tube #: 1
WBC, CSF: 0 /mm3

## 2015-03-04 LAB — GLUCOSE, CSF: GLUCOSE CSF: 71 mg/dL — AB (ref 40–70)

## 2015-03-04 LAB — PROTEIN, CSF: Total  Protein, CSF: 79 mg/dL — ABNORMAL HIGH (ref 15–45)

## 2015-03-04 MED ORDER — MORPHINE SULFATE 2 MG/ML IJ SOLN: 2.0000 mg | INTRAMUSCULAR | Status: DC | PRN

## 2015-03-04 MED ORDER — HEPARIN SODIUM (PORCINE) 5000 UNIT/ML IJ SOLN
5000.0000 [IU] | Freq: Three times a day (TID) | INTRAMUSCULAR | Status: DC
  Administered 2015-03-05 – 2015-03-07 (×8): 5000 [IU] via SUBCUTANEOUS
  Filled 2015-03-04 (×7): qty 1

## 2015-03-04 MED ORDER — ONDANSETRON HCL 4 MG PO TABS: 4.0000 mg | ORAL_TABLET | Freq: Four times a day (QID) | ORAL | Status: DC | PRN

## 2015-03-04 MED ORDER — ACETAMINOPHEN 650 MG RE SUPP: 650.0000 mg | Freq: Four times a day (QID) | RECTAL | Status: DC | PRN

## 2015-03-04 MED ORDER — ONDANSETRON HCL 4 MG/2ML IJ SOLN
4.0000 mg | Freq: Four times a day (QID) | INTRAMUSCULAR | Status: DC | PRN
  Administered 2015-03-06: 4 mg via INTRAVENOUS
  Filled 2015-03-04: qty 2

## 2015-03-04 MED ORDER — ACETAMINOPHEN 325 MG PO TABS
650.0000 mg | ORAL_TABLET | Freq: Four times a day (QID) | ORAL | Status: DC | PRN
  Administered 2015-03-05 – 2015-03-07 (×4): 650 mg via ORAL
  Filled 2015-03-04 (×4): qty 2

## 2015-03-04 MED ORDER — SODIUM CHLORIDE 0.9 % IJ SOLN
3.0000 mL | Freq: Two times a day (BID) | INTRAMUSCULAR | Status: DC
  Administered 2015-03-04 – 2015-03-07 (×6): 3 mL via INTRAVENOUS

## 2015-03-04 NOTE — H&P (Signed)
[place] [facility] [place] [name]    MR#:  [id number]  DATE OF BIRTH:  [birth year]   DATE OF ADMISSION:  03/04/2015  PRIMARY CARE PHYSICIAN: No PCP Per Patient   REQUESTING/REFERRING PHYSICIAN: Thomasene Lot  CHIEF COMPLAINT:   Chief Complaint  Patient presents with  . Weakness  . Neck Pain    HISTORY OF PRESENT ILLNESS:  Luis Walter  is a 65 y.o. 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 6 a day duration of symptoms originally starting with headache described as occipital in location sharp in quality without visual disturbances which then progressed to neck pain as well as shoulder pain. This pain is worse with movement including flexion. He was originally evaluated in the emergency department and received Flexeril as well as ibuprofen and subsequently discharged. After obtaining taking these medications his wife states that he was more drowsy/lethargic as well as his symptoms of neck pain progressively worsened. This prompted yet another presentation to the hospital at [facility] at that time was believed to be either musculoskeletal and received Lidoderm patches as well as Valium. Despite these repeated attempts at treatment his symptoms persisted and worsened and thus prompted him to present to Hospital further workup and evaluation. His wife mentions that he has had a nonproductive cough otherwise no further symptomatology. Emergency department course: He was noted to be febrile upon arrival given the combination of fever and symptoms he underwent lumbar puncture and the findings were inconsistent with infection   PAST MEDICAL HISTORY:   Past Medical History  Diagnosis Date  . ESRD (end stage renal disease)     a. on HD M,W,F; b. previously on  transplant list at [facility] - removed 2016  . DM2 (diabetes mellitus, type 2)   . CAD (coronary artery disease)     a. cardiac cath 2014: LM nl, mLAD 30%, dLAD 30%, ostLCx 50%, mid ramus 20% OM3 80% pRCA 20%, mRCA 20%, PDA 100% w/ left to right collats, medically managed   . Stroke   . Chronic systolic CHF (congestive heart failure)     a. echo 12/2014: EF 35-40%, mod diffuse HK, RWMA cannot be excluded, LV diastolic fxn parameters nl, mild MR, moderately dilated LA, mildly dilated RV internal cavity size, mild TR, PASP nl  . Gallstones   . Dialysis patient     PAST SURGICAL HISTORY:   Past Surgical History  Procedure Laterality Date  . Av fistula placement Left     SOCIAL HISTORY:   History  Substance Use Topics  . Smoking status: Former Research scientist (life sciences)  . Smokeless tobacco: Not on file  . Alcohol Use: No    FAMILY HISTORY:   Family History  Problem Relation Age of Onset  . Hypertension    . Diabetes      DRUG ALLERGIES:  No Known Allergies  REVIEW OF SYSTEMS:  REVIEW OF SYSTEMS:  CONSTITUTIONAL: Denies fevers, chills, positive fatigue, weakness.  EYES: Denies blurred vision, double vision, or eye pain.  EARS, NOSE, THROAT: Denies tinnitus, ear pain, hearing loss.  RESPIRATORY: denies cough, shortness of breath, wheezing  CARDIOVASCULAR: Denies chest pain, palpitations, edema.  GASTROINTESTINAL: Denies nausea, vomiting, diarrhea, abdominal pain.  GENITOURINARY: Denies dysuria, hematuria.  ENDOCRINE: Denies nocturia or thyroid problems. HEMATOLOGIC AND LYMPHATIC: Denies easy bruising or bleeding.  SKIN: Denies  rash or lesions.  MUSCULOSKELETAL: Positive neck pain as described above Denies back, shoulder, knees, hips pain, or further arthritic symptoms.  NEUROLOGIC: Denies paralysis, paresthesias.  PSYCHIATRIC: Denies anxiety or depressive symptoms. Otherwise full review of systems performed by me is negative.   MEDICATIONS AT HOME:   Prior to Admission medications    Medication Sig Start Date End Date Taking? Authorizing Provider  aspirin EC 325 MG tablet Take 325 mg by mouth daily.   Yes Historical Provider, MD  atorvastatin (LIPITOR) 10 MG tablet Take 1 tablet (10 mg total) by mouth daily. 01/01/15  Yes Epifanio Lesches, MD  cyclobenzaprine (FLEXERIL) 10 MG tablet Take 1 tablet (10 mg total) by mouth every 8 (eight) hours as needed for muscle spasms. 03/02/15  Yes Loney Hering, MD  diazepam (VALIUM) 5 MG tablet Take 5 mg by mouth every 8 (eight) hours as needed for muscle spasms.   Yes Historical Provider, MD  metoprolol tartrate (LOPRESSOR) 25 MG tablet Take 1 tablet (25 mg total) by mouth 2 (two) times daily. 01/01/15  Yes Epifanio Lesches, MD  sevelamer carbonate (RENVELA) 800 MG tablet Take 3,200 mg by mouth 3 (three) times daily with meals.   Yes Historical Provider, MD  metroNIDAZOLE (FLAGYL) 500 MG tablet Take 1 tablet (500 mg total) by mouth 3 (three) times daily. Patient not taking: Reported on 03/04/2015 01/26/15   Demetrios Loll, MD      VITAL SIGNS:  Blood pressure 144/84, pulse 61, temperature 98.7 F (37.1 C), temperature source Oral, resp. rate 16, height 5\' 7"  (1.702 m), weight 185 lb (83.915 kg), SpO2 100 %.  PHYSICAL EXAMINATION:  VITAL SIGNS: Filed Vitals:   03/04/15 2307  BP:   Pulse:   Temp: 98.7 F (37.1 C)  Resp:    GENERAL:63 y.o.male currently in mild distress given pain.  HEAD: Normocephalic, atraumatic.  EYES: Pupils equal, round, reactive to light. Extraocular muscles intact. No scleral icterus.  MOUTH: Moist mucosal membrane. Dentition poor. No abscess noted.  EAR, NOSE, THROAT: Clear without exudates. No external lesions.  NECK: Supple. No thyromegaly. No nodules. No JVD.  PULMONARY: Clear to ascultation, without wheeze rails or rhonci. No use of accessory muscles, Good respiratory effort. good air entry bilaterally CHEST: Nontender to palpation.  CARDIOVASCULAR: S1 and S2. Regular rate and rhythm. No murmurs,  rubs, or gallops. No edema. Pedal pulses 2+ bilaterally.  GASTROINTESTINAL: Soft, nontender, nondistended. No masses. Positive bowel sounds. No hepatosplenomegaly.  MUSCULOSKELETAL: No swelling, clubbing, or edema. Range of motion full in all extremities. In neck pain elicited with flexion as well as bilateral rotation, however no significant stiffness NEUROLOGIC: Cranial nerves II through XII are intact. No gross focal neurological deficits. Sensation intact. Reflexes intact.  SKIN: No ulceration, lesions, rashes, or cyanosis. Skin warm and dry. Turgor intact.  PSYCHIATRIC: Mood, affect within normal limits. The patient is awake, alert and oriented x 3. Insight, judgment intact.    LABORATORY PANEL:   CBC  Recent Labs Lab 03/04/15 1759  WBC 6.3  HGB 11.3*  HCT 35.6*  PLT 132*   ------------------------------------------------------------------------------------------------------------------  Chemistries   Recent Labs Lab 03/04/15 1759  NA 138  K 3.6  CL 93*  CO2 35*  GLUCOSE 109*  BUN 16  CREATININE 5.61*  CALCIUM 8.8*  AST 21  ALT 10*  ALKPHOS 68  BILITOT 1.5*   ------------------------------------------------------------------------------------------------------------------  Cardiac Enzymes  Recent Labs Lab 03/04/15 1759  TROPONINI 0.28*   ------------------------------------------------------------------------------------------------------------------  RADIOLOGY:  Dg Chest 1 View  03/04/2015  CLINICAL DATA:  Altered mental status and weakness.  EXAM: CHEST  1 VIEW  COMPARISON:  03/01/2015  FINDINGS: There is moderate cardiac enlargement. Mild pulmonary vascular congestion noted. The lung volumes are low. No pleural effusion or edema. No airspace consolidation identified.  IMPRESSION: 1. Cardiac enlargement and pulmonary vascular congestion.   Electronically Signed   By: Kerby Moors M.D.   On: 03/04/2015 17:06    EKG:   Orders placed or performed  during the hospital encounter of 03/04/15  . ED EKG  . ED EKG    IMPRESSION AND PLAN:   64 year old African-American gentleman history of end-stage renal disease with dialysis, coronary artery disease, systolic congestive heart failure presenting with progressively worsening neck pain.  1. Cervicalgia: Given multiple failed attempts at treatment via outpatient, lumbar puncture without evidence of infection, will continue supportive measures, check MRI cervical spine 2. Elevated troponin: Telemetry, trend cardiac enzymes 3. End-stage renal disease on hemodialysis: Monday Wednesday Friday, successfully had dialysis today 4. Coronary artery disease without angina: Continue aspirin and statin therapy 5. Venous thrombolic embolism prophylactic: Heparin subcutaneous    All the records are reviewed and case discussed with ED provider. Management plans discussed with the patient, family and they are in agreement.  CODE STATUS: Full  TOTAL TIME TAKING CARE OF THIS PATIENT: 35 minutes.    Francisca Harbuck,  Karenann Cai.D on 03/04/2015 at 11:16 PM  Between 7am to 6pm - Pager - [phone]  After 6pm: House Pager: - [phone]  [facility]  Office  [phone]  CC: Primary care physician; No PCP Per Patient

## 2015-03-04 NOTE — ED Notes (Signed)
Pt here from [facility] with AMS and weakness. Wife reports pt has been getting increasingly worse, pt normally able to walk and talk and take care of himself. Pt unable to do so at this time. Pt with some left hand grip weakness, pt has tongue hanging out of mouth but able to follow commands with it. Pt also has head tilted forward and reports pain when moving it. Pt seen here for headache this week and see at [facility] for pinched nerve and muscle spasms.

## 2015-03-04 NOTE — ED Notes (Signed)
Admitting, Dr. Lavetta Nielsen at bedside.

## 2015-03-04 NOTE — ED Provider Notes (Signed)
[facility] Emergency Department Provider Note  ____________________________________________  Time seen: 1620  I have reviewed the triage vital signs and the nursing notes.   HISTORY  Chief Complaint Weakness and Neck Pain     HPI Luis Walter is a 64 y.o. male who has a history of CVA and has been having neck pain for approximately a week and a half. Initially this came with a headache as well. He presented to [facility] emergency Department on July 13 and was diagnosed with a headache and neck strain. He was described Flexeril. The wife has been diligently taking care of him, giving him Flexeril, 10 mg, 3 times a day, and alternating aspirin and ibuprofen. He has grown increasingly weak and having increasing difficulty ambulating. Returning from [facility] recently and they stopped at [facility]. The physician there suggested he had a pinched nerve.    His symptoms are continuing to progress, with difficulty ambulating and increasing weakness. The patient's wife took him to [place] clinic today and he was sent to the emergency department for further evaluation.  The wife was not aware of any fever, although she says he feels warm at times. On arrival in the emergency department, he had a temperature of 99.9, however this is risen since his arrival to 101.7.  Patient does have a complex medical history, including renal failure with dialysis, diabetes, cardiac disease, recent admission to the hospital for pneumonia all by admission for Clostridium difficile infection approximate 4 weeks ago.    Past Medical History  Diagnosis Date  . ESRD (end stage renal disease)     a. on HD M,W,F; b. previously on transplant list at [facility] - removed 2016  . DM2 (diabetes mellitus, type 2)   . CAD (coronary artery disease)     a. cardiac cath 2014: LM nl, mLAD 30%, dLAD 30%, ostLCx 50%, mid ramus 20% OM3 80% pRCA 20%, mRCA 20%, PDA 100% w/  left to right collats, medically managed   . Stroke   . Chronic systolic CHF (congestive heart failure)     a. echo 12/2014: EF 35-40%, mod diffuse HK, RWMA cannot be excluded, LV diastolic fxn parameters nl, mild MR, moderately dilated LA, mildly dilated RV internal cavity size, mild TR, PASP nl  . Gallstones   . Dialysis patient     Patient Active Problem List   Diagnosis Date Noted  . Clostridium difficile diarrhea 01/24/2015  . Intractable nausea and vomiting 01/23/2015  . Elevated troponin 01/23/2015  . Chronic systolic CHF (congestive heart failure)   . CAD (coronary artery disease)   . Pneumonia 12/25/2014  . ESRD (end stage renal disease) 12/25/2014  . DM (diabetes mellitus) 12/25/2014  . CVA (cerebral infarction) 12/25/2014    Past Surgical History  Procedure Laterality Date  . Av fistula placement Left     Current Outpatient Rx  Name  Route  Sig  Dispense  Refill  . aspirin EC 325 MG tablet   Oral   Take 325 mg by mouth daily.         Marland Kitchen atorvastatin (LIPITOR) 10 MG tablet   Oral   Take 1 tablet (10 mg total) by mouth daily.   30 tablet   0   . cyclobenzaprine (FLEXERIL) 10 MG tablet   Oral   Take 1 tablet (10 mg total) by mouth every 8 (eight) hours as needed for muscle spasms.   15 tablet   0   . diazepam (VALIUM)  5 MG tablet   Oral   Take 5 mg by mouth every 8 (eight) hours as needed for muscle spasms.         . metoprolol tartrate (LOPRESSOR) 25 MG tablet   Oral   Take 1 tablet (25 mg total) by mouth 2 (two) times daily.   60 tablet   0   . sevelamer carbonate (RENVELA) 800 MG tablet   Oral   Take 3,200 mg by mouth 3 (three) times daily with meals.         . metroNIDAZOLE (FLAGYL) 500 MG tablet   Oral   Take 1 tablet (500 mg total) by mouth 3 (three) times daily. Patient not taking: Reported on 03/04/2015   36 tablet   0     Allergies Review of patient's allergies indicates no known allergies.  Family History  Problem Relation  Age of Onset  . Hypertension    . Diabetes      Social History History  Substance Use Topics  . Smoking status: Former Research scientist (life sciences)  . Smokeless tobacco: Not on file  . Alcohol Use: No    Review of Systems  Constitutional: Patient and his family were unaware of fever, but he has developed one in the emergency department. Growing weakness. See history of present illness ENT: Negative for sore throat. Cardiovascular: Negative for chest pain. Respiratory: Negative for shortness of breath. Gastrointestinal: Negative for abdominal pain, vomiting and diarrhea. Genitourinary: Negative for dysuria. Musculoskeletal: No myalgias or injuries. Skin: Negative for rash. Neurological: History of CVA. Recent headache, although this has improved some. General weakness. See history of present illness   10-point ROS otherwise negative.  ____________________________________________   PHYSICAL EXAM:  VITAL SIGNS: ED Triage Vitals  Enc Vitals Group     BP 03/04/15 1508 124/76 mmHg     Pulse Rate 03/04/15 1508 96     Resp 03/04/15 1508 16     Temp 03/04/15 1508 99.9 F (37.7 C)     Temp Source 03/04/15 1508 Oral     SpO2 03/04/15 1508 95 %     Weight 03/04/15 1508 185 lb (83.915 kg)     Height 03/04/15 1508 5\' 7"  (7.425 m)     Head Cir --      Peak Flow --      Pain Score 03/04/15 1509 7     Pain Loc --      Pain Edu? --      Excl. in [place]? --     Constitutional:  Alert, but not fully. He is interactive for much of the interview but appears somewhat somnolent. ENT   Head: Normocephalic and atraumatic.   Nose: No congestion/rhinnorhea.   Mouth/Throat: Mucous membranes are moist. Neck: Notable tenderness to the posterior portion the neck. Some discomfort with motion. Cardiovascular: Tachycardic at 105. regular rhythm, no murmur noted Respiratory:  Normal respiratory effort, no tachypnea.    Breath sounds are clear and equal bilaterally.  Gastrointestinal: Soft and nontender. No  distention.  Back: No muscle spasm, no tenderness, no CVA tenderness. Musculoskeletal: No deformity noted. Nontender with normal range of motion in all extremities.  No noted edema. Neurologic:  Appears a little bit somnolent. Does not off to sleep at one point when I'm speaking with his wife. He is able to move his extremities in a normal fashion.  Skin:  Skin is warm, dry. No rash noted. Psychiatric: A bit somnolent, but intact thought process. ____________________________________________    LABS (pertinent positives/negatives)  Labs  Reviewed  CBC WITH DIFFERENTIAL/PLATELET - Abnormal; Notable for the following:    RBC 4.09 (*)    Hemoglobin 11.3 (*)    HCT 35.6 (*)    MCHC 31.7 (*)    RDW 17.9 (*)    Platelets 132 (*)    All other components within normal limits  COMPREHENSIVE METABOLIC PANEL - Abnormal; Notable for the following:    Chloride 93 (*)    CO2 35 (*)    Glucose, Bld 109 (*)    Creatinine, Ser 5.61 (*)    Calcium 8.8 (*)    Albumin 3.0 (*)    ALT 10 (*)    Total Bilirubin 1.5 (*)    GFR calc non Af Amer 10 (*)    GFR calc Af Amer 11 (*)    All other components within normal limits  TROPONIN I - Abnormal; Notable for the following:    Troponin I 0.28 (*)    All other components within normal limits  CULTURE, BLOOD (ROUTINE X 2)  CULTURE, BLOOD (ROUTINE X 2)  CSF CULTURE  LACTIC ACID, PLASMA  CBC WITH DIFFERENTIAL/PLATELET  URINALYSIS COMPLETEWITH MICROSCOPIC ([facility] ONLY)  LACTIC ACID, PLASMA  CSF CELL COUNT WITH DIFFERENTIAL  GLUCOSE, CSF  PROTEIN, CSF  CSF CELL COUNT WITH DIFFERENTIAL  CBG MONITORING, ED     ____________________________________________   EKG  ED ECG REPORT I, Cassie Henkels W, the attending physician, personally viewed and interpreted this ECG.   Date: 03/04/2015  EKG Time: 1813  Rate: 96  Rhythm: Normal sinus rhythm with 3 PVCs noted on this 12 second strip.  Axis: Normal  Intervals: QTC of 502  ST&T Change: None  noted   ____________________________________________    RADIOLOGY  Chest x-ray - cardiac enlargement and pulmonary vascular congestion.  ____________________________________________   PROCEDURES  Due to the inability to establish a peripheral line through conventional means, I established an IV line with ultrasound guidance.   Angiocath insertion Performed by: Ahmed Prima  Consent: Verbal consent obtained. Risks and benefits: risks, benefits and alternatives were discussed Time out: Immediately prior to procedure a "time out" was called to verify the correct patient, procedure, equipment, support staff and site/side marked as required.  Preparation: Patient was prepped and draped in the usual sterile fashion.  Vein Location: Right antecubital fossa  Ultrasound Guided  Gauge: 20-gauge  Normal blood return and flush without difficulty Patient tolerance: Patient tolerated the procedure well with no immediate complications.  LUMBAR PUNCTURE  Date/Time: 03/04/2015 at 7:38 PM Performed by: Ahmed Prima  Consent: Verbal consent obtained. Written consent obtained. Risks and benefits: risks, benefits and alternatives were discussed Consent given by: Both patient and his wife, but his wife signed the consent. Patient understanding: patient states understanding of the procedure being performed  Patient consent: the patient's understanding of the procedure matches consent given  Procedure consent: procedure consent matches procedure scheduled  Relevant documents: relevant documents present and verified  Test results: test results available and properly labeled Site marked: the operative site was marked Imaging studies: imaging studies available -  recent CT scan on July 13 was reviewed. Patient identity confirmed: verbally with patient and arm band  Time out: Immediately prior to procedure a "time out" was called to verify the correct patient, procedure, equipment,  support staff and site/side marked as required.  Indications: Headache, neck pain, fever, general weakness and malaise  Anesthesia: local infiltration Local anesthetic: lidocaine 1% without epinephrine Anesthetic total: 4 ml Patient sedated: Not sedated  Analgesia:  None  Preparation: Patient was prepped and draped in the usual sterile fashion. Lumbar space: L3-L4 interspace Patient's position: left lateral decubitus Needle gauge: 18 Needle length: 3.5 in Number of attempts: 1 Opening pressure: 23 cm H2O Fluid appearance: Scant amount of blood with the initial fluid, otherwise clear and normal-appearing Tubes of fluid: The flow rate significantly diminished during the second tube. A small amount was collected into a third tube.  Total volume: 2.5 ml Post-procedure: site cleaned and adhesive bandage applied Patient tolerance: Patient tolerated the procedure well with no immediate complications  CRITICAL CARE Performed by: Ahmed Prima   Total critical care time: 30 min due to the complicated nature of this patient with heart disease, renal disease, elevated troponin today, signs and symptoms of meningitis, general weakness and ambulatory dysfunction.    Critical care time was exclusive of separately billable procedures and treating other patients.  Critical care was necessary to treat or prevent imminent or life-threatening deterioration.  Critical care was time spent personally by me on the following activities: development of treatment plan with patient and/or surrogate as well as nursing, discussions with consultants, evaluation of patient's response to treatment, examination of patient, obtaining history from patient or surrogate, ordering and performing treatments and interventions, ordering and review of laboratory studies, ordering and review of radiographic studies, pulse oximetry and re-evaluation of patient's  condition.   ____________________________________________   INITIAL IMPRESSION / ASSESSMENT AND PLAN / ED COURSE  Pertinent labs & imaging results that were available during my care of the patient were reviewed by me and considered in my medical decision making (see chart for details).  Patient with multiple health problems, with neck pain and headache, and now was documented fever. There are a number of possible explanations for his increased weakness and difficulty ambulating, however given the constellation of symptoms, we need to rule out meningitis.   I have discussed doing a lumbar puncture with the patient and his wife. A both agreed to the procedure.  ----------------------------------------- 5:50 PM on 03/04/2015 -----------------------------------------  Due to the inability to establish an IV by traditional means, I placed an IV by ultrasound guidance. The patient and wife again agreed to the lumbar puncture. While the patient has an intact thought process and content consent, he asked that I have the wife signed the consent form due to his fatigue.  ----------------------------------------- 7:45 PM on 03/04/2015 -----------------------------------------  The lumbar puncture procedure, documented above, was performed. The patient tolerated it well. His anatomy was a little tight but once a past was found obtaining the CSF fluid was uncomplicated except for a decreased flow rate during the second tube.  The patient's troponin level is elevated at 0.28. This is significantly higher than his prior baseline troponin levels, which have also been elevated.  We will speak with the hospitalist for admission hospital. ____________________________________________   FINAL CLINICAL IMPRESSION(S) / ED DIAGNOSES  Final diagnoses:  Elevated troponin  Weakness generalized  Ambulatory dysfunction  Neck pain  Acute nonintractable headache, unspecified headache type  Other specified  fever      Ahmed Prima, MD 03/04/15 1947

## 2015-03-05 ENCOUNTER — Observation Stay: Payer: Medicare Other

## 2015-03-05 DIAGNOSIS — R7989 Other specified abnormal findings of blood chemistry: Secondary | ICD-10-CM | POA: Diagnosis not present

## 2015-03-05 DIAGNOSIS — N186 End stage renal disease: Secondary | ICD-10-CM | POA: Diagnosis not present

## 2015-03-05 DIAGNOSIS — I2510 Atherosclerotic heart disease of native coronary artery without angina pectoris: Secondary | ICD-10-CM | POA: Diagnosis not present

## 2015-03-05 DIAGNOSIS — R600 Localized edema: Secondary | ICD-10-CM | POA: Diagnosis not present

## 2015-03-05 DIAGNOSIS — M542 Cervicalgia: Secondary | ICD-10-CM | POA: Diagnosis not present

## 2015-03-05 DIAGNOSIS — M609 Myositis, unspecified: Secondary | ICD-10-CM | POA: Diagnosis not present

## 2015-03-05 DIAGNOSIS — M5022 Other cervical disc displacement, mid-cervical region: Secondary | ICD-10-CM | POA: Diagnosis not present

## 2015-03-05 LAB — GLUCOSE, CAPILLARY
Glucose-Capillary: 119 mg/dL — ABNORMAL HIGH (ref 65–99)
Glucose-Capillary: 255 mg/dL — ABNORMAL HIGH (ref 65–99)

## 2015-03-05 LAB — TROPONIN I
TROPONIN I: 0.26 ng/mL — AB (ref <0.031)
TROPONIN I: 0.24 ng/mL — AB (ref <0.031)

## 2015-03-05 MED ORDER — METOPROLOL TARTRATE 50 MG PO TABS
50.0000 mg | ORAL_TABLET | Freq: Two times a day (BID) | ORAL | Status: DC
Start: 2015-03-05 — End: 2015-03-07
  Administered 2015-03-05 – 2015-03-07 (×4): 50 mg via ORAL
  Filled 2015-03-05 (×5): qty 1

## 2015-03-05 MED ORDER — METOPROLOL TARTRATE 50 MG PO TABS: 50.0000 mg | ORAL_TABLET | Freq: Two times a day (BID) | ORAL | Status: DC

## 2015-03-05 MED ORDER — ATORVASTATIN CALCIUM 10 MG PO TABS
10.0000 mg | ORAL_TABLET | Freq: Every day | ORAL | Status: DC
  Administered 2015-03-05 – 2015-03-07 (×3): 10 mg via ORAL
  Filled 2015-03-05 (×4): qty 1

## 2015-03-05 MED ORDER — METHYLPREDNISOLONE SODIUM SUCC 125 MG IJ SOLR
60.0000 mg | Freq: Two times a day (BID) | INTRAMUSCULAR | Status: DC
  Administered 2015-03-05 – 2015-03-06 (×3): 60 mg via INTRAVENOUS
  Administered 2015-03-07: 14:00:00 via INTRAVENOUS
  Administered 2015-03-07: 60 mg via INTRAVENOUS
  Filled 2015-03-05 (×5): qty 2

## 2015-03-05 MED ORDER — CYCLOBENZAPRINE HCL 10 MG PO TABS: 10.0000 mg | ORAL_TABLET | Freq: Three times a day (TID) | ORAL | Status: DC | PRN

## 2015-03-05 MED ORDER — METOPROLOL TARTRATE 25 MG PO TABS: 25.0000 mg | ORAL_TABLET | Freq: Two times a day (BID) | ORAL | Status: DC

## 2015-03-05 MED ORDER — SEVELAMER CARBONATE 800 MG PO TABS
3200.0000 mg | ORAL_TABLET | Freq: Three times a day (TID) | ORAL | Status: DC
  Administered 2015-03-05 – 2015-03-07 (×7): 3200 mg via ORAL
  Filled 2015-03-05 (×7): qty 4

## 2015-03-05 MED ORDER — ASPIRIN EC 325 MG PO TBEC
325.0000 mg | DELAYED_RELEASE_TABLET | Freq: Every day | ORAL | Status: DC
  Administered 2015-03-05 – 2015-03-07 (×3): 325 mg via ORAL
  Filled 2015-03-05 (×3): qty 1

## 2015-03-05 NOTE — Care Management Note (Signed)
Case Management Note  Patient Details  [name] MRN: [id number] Date of Birth: May 24, 1951  Subjective/Objective:                    Action/Plan:   Expected Discharge Date:                  Expected Discharge Plan:     In-House Referral:     Discharge planning Services     Post Acute Care Choice:    Choice offered to:     DME Arranged:    DME Agency:     HH Arranged:    [place] Agency:     Status of Service:     Medicare Important Message Given:    Date Medicare IM Given:    Medicare IM give by:    Date Additional Medicare IM Given:    Additional Medicare Important Message give by:     If discussed at [place] of Stay Meetings, dates discussed:    Additional Comments:  Medicare observation letter delivered to patient and spouse, verbalized understanding, signed by spouse, copy given original placed on chart.  Ival Bible, RN 03/05/2015, 11:07 AM

## 2015-03-05 NOTE — Progress Notes (Signed)
13 beats of v.tach on tele/ asympotmaic/ elevated tropoinis/ no active chest pain/ MD paged to make aware and request cardio consult

## 2015-03-05 NOTE — Progress Notes (Signed)
[place] at [facility]                                                                                                                                                                                            Patient Demographics   Luis Walter, is a 64 y.o. male, DOB - 04/11/1951, WUJ:[id number]  Admit date - 03/04/2015   Admitting Physician Lytle Butte, MD  Outpatient Primary MD for the patient is No PCP Per Patient   LOS -   Subjective: Patient admitted with neck pain noted to have elevated troponin denies any chest pain or shortness of breath     Review of Systems:   CONSTITUTIONAL: No documented fever. No fatigue, weakness. No weight gain, no weight loss.  EYES: No blurry or double vision.  ENT: No tinnitus. No postnasal drip. No redness of the oropharynx.  RESPIRATORY: No cough, no wheeze, no hemoptysis. No dyspnea.  CARDIOVASCULAR: No chest pain. No orthopnea. No palpitations. No syncope.  GASTROINTESTINAL: No nausea, no vomiting or diarrhea. No abdominal pain. No melena or hematochezia.  GENITOURINARY: No dysuria or hematuria.  ENDOCRINE: No polyuria or nocturia. No heat or cold intolerance.  HEMATOLOGY: No anemia. No bruising. No bleeding.  INTEGUMENTARY: No rashes. No lesions.  MUSCULOSKELETAL: Positive neck pain NEUROLOGIC: No numbness, tingling, or ataxia. No seizure-type activity.  PSYCHIATRIC: No anxiety. No insomnia. No ADD.    Vitals:   Filed Vitals:   03/05/15 0107 03/05/15 0453 03/05/15 0745 03/05/15 1202  BP: 144/92 147/77 137/76 138/79  Pulse: 98 97 94 81  Temp: 98.6 F (37 C) 99.1 F (37.3 C) 98.8 F (37.1 C) 98.7 F (37.1 C)  TempSrc: Oral Oral    Resp: 20 20 18 19   Height:      Weight: 78.563 kg (173 lb 3.2 oz)     SpO2: 91% 94% 94% 100%    Wt Readings from Last 3 Encounters:  03/05/15 78.563 kg (173 lb 3.2 oz)  03/05/15 78.472 kg (173 lb)  03/01/15 83.915 kg (185 lb)     Intake/Output Summary  (Last 24 hours) at 03/05/15 1333 Last data filed at 03/05/15 0830  Gross per 24 hour  Intake      3 ml  Output      0 ml  Net      3 ml    Physical Exam:   GENERAL: Pleasant-appearing in no apparent distress.  HEAD, EYES, EARS, NOSE AND THROAT: Atraumatic, normocephalic. Extraocular muscles are intact. Pupils equal and reactive to light. Sclerae anicteric. No conjunctival injection. No oro-pharyngeal erythema.  NECK: Supple. Tenderness in the  posterior neck with limited range of motion HEART: Regular rate and rhythm, tachycardic. No murmurs, no rubs, no clicks.  LUNGS: Clear to auscultation bilaterally. No rales or rhonchi. No wheezes.  ABDOMEN: Soft, flat, nontender, nondistended. Has good bowel sounds. No hepatosplenomegaly appreciated.  EXTREMITIES: No evidence of any cyanosis, clubbing, or peripheral edema.  +2 pedal and radial pulses bilaterally.  NEUROLOGIC: The patient is alert, awake, and oriented x3 with no focal motor or sensory deficits appreciated bilaterally.  SKIN: Moist and warm with no rashes appreciated.  Psych: Not anxious, depressed LN: No inguinal LN enlargement    Antibiotics   Anti-infectives    None      Medications   Scheduled Meds: . aspirin EC  325 mg Oral Daily  . atorvastatin  10 mg Oral Daily  . heparin  5,000 Units Subcutaneous 3 times per day  . metoprolol tartrate  50 mg Oral BID  . sevelamer carbonate  3,200 mg Oral TID WC  . sodium chloride  3 mL Intravenous Q12H   Continuous Infusions:  PRN Meds:.acetaminophen **OR** acetaminophen, cyclobenzaprine, morphine injection, ondansetron **OR** ondansetron (ZOFRAN) IV   Data Review:   Micro Results Recent Results (from the past 240 hour(s))  Blood culture (routine x 2)     Status: None (Preliminary result)   Collection Time: 03/04/15  5:13 PM  Result Value Ref Range Status   Specimen Description BLOOD RIGHT HAND  Final   Special Requests BOTTLES DRAWN AEROBIC AND ANAEROBIC [id number]   Final   Culture NO GROWTH < 24 HOURS  Final   Report Status PENDING  Incomplete  Blood culture (routine x 2)     Status: None (Preliminary result)   Collection Time: 03/04/15  5:35 PM  Result Value Ref Range Status   Specimen Description BLOOD RIGHT ARM  Final   Special Requests [id number]  Final   Culture NO GROWTH < 24 HOURS  Final   Report Status PENDING  Incomplete  CSF culture with Stat gram stain     Status: None (Preliminary result)   Collection Time: 03/04/15  7:27 PM  Result Value Ref Range Status   Specimen Description CSF  Final   Special Requests Normal  Final   Gram Stain   Final    NO ORGANISMS SEEN FEW WBC SEEN MANY RBC SEEN CONFIRMED BY RWW    Culture NO GROWTH <12 HOURS  Final   Report Status PENDING  Incomplete    Radiology Reports Dg Chest 1 View  03/04/2015   CLINICAL DATA:  Altered mental status and weakness.  EXAM: CHEST  1 VIEW  COMPARISON:  03/01/2015  FINDINGS: There is moderate cardiac enlargement. Mild pulmonary vascular congestion noted. The lung volumes are low. No pleural effusion or edema. No airspace consolidation identified.  IMPRESSION: 1. Cardiac enlargement and pulmonary vascular congestion.   Electronically Signed   By: Kerby Moors M.D.   On: 03/04/2015 17:06   Dg Chest 2 View  03/02/2015   CLINICAL DATA:  Headaches and neck pain today. Unsteady gait with onset about 6 p.m. History of diabetes, CHF, and dialysis.  EXAM: CHEST  2 VIEW  COMPARISON:  12/25/2014  FINDINGS: Mild cardiac enlargement without significant vascular congestion. No focal airspace disease or consolidation in the lungs. No blunting of costophrenic angles. No pneumothorax. Tortuous aorta.  IMPRESSION: No active cardiopulmonary disease.   Electronically Signed   By: Lucienne Capers M.D.   On: 03/02/2015 00:10   Ct Head Wo Contrast  03/01/2015  CLINICAL DATA:  Acute headache.  EXAM: CT HEAD WITHOUT CONTRAST  TECHNIQUE: Contiguous axial images were obtained from the base  of the skull through the vertex without intravenous contrast.  COMPARISON:  CT scan of Dec 29, 2014.  FINDINGS: Bony calvarium appears intact. Stable right frontal and parietal encephalomalacia is noted as well as left parietal encephalomalacia, consistent with old infarctions. Old right cerebellar infarction is noted as well. No mass effect or midline shift is noted. Ventricular size is within normal limits. There is no evidence of mass lesion, hemorrhage or acute infarction.  IMPRESSION: Bilateral encephalomalacia consistent with old infarctions. No acute intracranial abnormality seen.   Electronically Signed   By: Marijo Conception, M.D.   On: 03/01/2015 21:41   Mr Cervical Spine Wo Contrast  03/05/2015   CLINICAL DATA:  Neck pain, no radiculopathy.  EXAM: MRI CERVICAL SPINE WITHOUT CONTRAST  TECHNIQUE: Multiplanar, multisequence MR imaging of the cervical spine was performed. No intravenous contrast was administered.  COMPARISON:  None.  FINDINGS: The cervical cord is normal in size and signal. Vertebral body heights are maintained. The disc spaces are preserved. The cervical spine is normal in lordotic alignment. No static listhesis. There is diffuse marrow signal hypointensity as can be seen with end-stage renal disease. Cerebellar tonsils are normal in position. 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  There is paraspinal soft tissue edema both anteriorly and posteriorly, most severe in the prevertebral space. There is no drainable fluid collection. There is no epidural abnormality.  C2-3: No significant disc bulge. No neural foraminal stenosis. No central canal stenosis.  C3-4: No significant disc bulge. No neural foraminal stenosis. No central canal stenosis.  C4-5: No significant disc bulge. No neural foraminal stenosis. No central canal stenosis.  C5-6: No significant disc bulge. No neural foraminal stenosis. No central canal stenosis.  C6-7: Mild eccentric left broad-based disc bulge. No neural  foraminal stenosis. No central canal stenosis.  C7-T1: No significant disc bulge. No neural foraminal stenosis. No central canal stenosis.  IMPRESSION: 1. There is paraspinal soft tissue edema both anteriorly and posteriorly, most severe in the prevertebral space without a drainable fluid collection or epidural abnormality. There is no marrow signal abnormality or disc signal abnormality. There are no findings on the current MRI to suggest discitis/osteomyelitis. The soft tissue abnormality may reflect diffuse anasarca given patient's history of end-stage renal disease versus soft tissue infection. In the setting of trauma, this would be concerning for ligamentous injury anteriorly and posteriorly.   Electronically Signed   By: Kathreen Devoid   On: 03/05/2015 12:04     CBC  Recent Labs Lab 03/01/15 2116 03/04/15 1759  WBC 4.9 6.3  HGB 10.3* 11.3*  HCT 32.5* 35.6*  PLT 160 132*  MCV 88.1 87.1  MCH 27.9 27.6  MCHC 31.7* 31.7*  RDW 18.0* 17.9*  LYMPHSABS  --  1.0  MONOABS  --  0.7  EOSABS  --  0.0  BASOSABS  --  0.1    Chemistries   Recent Labs Lab 03/01/15 2116 03/04/15 1759  NA 137 138  K 3.2* 3.6  CL 96* 93*  CO2 31 35*  GLUCOSE 155* 109*  BUN 20 16  CREATININE 7.98* 5.61*  CALCIUM 9.0 8.8*  AST  --  21  ALT  --  10*  ALKPHOS  --  68  BILITOT  --  1.5*   ------------------------------------------------------------------------------------------------------------------ estimated creatinine clearance is 12.9 mL/min (by C-G formula based on Cr of 5.61). ------------------------------------------------------------------------------------------------------------------ No  results for input(s): HGBA1C in the last 72 hours. ------------------------------------------------------------------------------------------------------------------ No results for input(s): CHOL, HDL, LDLCALC, TRIG, CHOLHDL, LDLDIRECT in the last 72  hours. ------------------------------------------------------------------------------------------------------------------ No results for input(s): TSH, T4TOTAL, T3FREE, THYROIDAB in the last 72 hours.  Invalid input(s): FREET3 ------------------------------------------------------------------------------------------------------------------ No results for input(s): VITAMINB12, FOLATE, FERRITIN, TIBC, IRON, RETICCTPCT in the last 72 hours.  Coagulation profile No results for input(s): INR, PROTIME in the last 168 hours.  No results for input(s): DDIMER in the last 72 hours.  Cardiac Enzymes  Recent Labs Lab 03/04/15 1759 03/04/15 2316 03/05/15 0448  TROPONINI 0.28* 0.29* 0.26*   ------------------------------------------------------------------------------------------------------------------ Invalid input(s): POCBNP    Assessment & Plan   64 year old African-American gentleman history of end-stage renal disease with dialysis, coronary artery disease, systolic congestive heart failure presenting with progressively worsening neck pain.  1. Cervicalgia: Status post MRI with inflammation noted in the paraspinal muscles, no evidence of abscess unfortunately not drainable amount of fluid. We'll try some steroids to see if that helps with the current symptoms. Check a sedimentation rate 2. Elevated troponin: Felt to be due to demand ischemia and chronic renal failure appreciate cardiology input 3. End-stage renal disease on hemodialysis: Monday Wednesday Friday, consult nephrology 4. Coronary artery disease without angina: Continue aspirin and statin therapy 5. Venous thrombolic embolism prophylactic: Heparin subcutaneous       Code Status Orders        Start     Ordered   03/04/15 2253  Full code   Continuous     03/04/15 2253           Consults  cardiology DVT Prophylaxis heparin  Lab Results  Component Value Date   PLT 132* 03/04/2015     Time Spent in  minutes   35min  Greater than 50% of time spent in care coordination and counseling.   Dustin Flock M.D on 03/05/2015 at 1:33 PM  Between 7am to 6pm - Pager - [phone]  After 6pm go to [email or web address] - password [email or web address] [facility]   Office  [phone]

## 2015-03-05 NOTE — Consult Note (Addendum)
CARDIOLOGY CONSULT NOTE       Patient ID: HILMER ALIBERTI MRN: [id number] DOB/AGE: 09-23-1950 64 y.o.  Admit date: 03/04/2015 Referring Physician:  Posey Pronto Primary Physician: No PCP Per Patient Primary Cardiologist:  Rockey Situ Reason for Consultation:  Elevated troponin  Active Problems:   Elevated troponin   HPI:  64 y.o. CRF on dialysis.  History of CAD managed medically with total PDA by cath 2014 collaterals.  Myovue 2015 not ischemic  Admitted with persistent left neck pain.  LP negative for infection.  Plan to get MRI.  No chest pain troponin mildly elevated .26 with no evolution.  Currently just had large BP and lethargic.  No dyspnea  Telemetry review shows PVC;s  Compliant with meds. Wife with him and seems supportive.  Denies palpitations dyspnea syncope.  Neck pain is positional been going on for weeks with multiple ER visists  ROS All other systems reviewed and negative except as noted above  Past Medical History  Diagnosis Date  . ESRD (end stage renal disease)     a. on HD M,W,F; b. previously on transplant list at [facility] - removed 2016  . DM2 (diabetes mellitus, type 2)   . CAD (coronary artery disease)     a. cardiac cath 2014: LM nl, mLAD 30%, dLAD 30%, ostLCx 50%, mid ramus 20% OM3 80% pRCA 20%, mRCA 20%, PDA 100% w/ left to right collats, medically managed   . Stroke   . Chronic systolic CHF (congestive heart failure)     a. echo 12/2014: EF 35-40%, mod diffuse HK, RWMA cannot be excluded, LV diastolic fxn parameters nl, mild MR, moderately dilated LA, mildly dilated RV internal cavity size, mild TR, PASP nl  . Gallstones   . Dialysis patient     Family History  Problem Relation Age of Onset  . Hypertension    . Diabetes      History   Social History  . Marital Status: Married    Spouse Name: N/A  . Number of Children: N/A  . Years of Education: N/A   Occupational History  . Not on file.   Social History Main Topics  . Smoking status: Former Research scientist (life sciences)  .  Smokeless tobacco: Not on file  . Alcohol Use: No  . Drug Use: No  . Sexual Activity: Not on file   Other Topics Concern  . Not on file   Social History Narrative    Past Surgical History  Procedure Laterality Date  . Av fistula placement Left      . aspirin EC  325 mg Oral Daily  . atorvastatin  10 mg Oral Daily  . heparin  5,000 Units Subcutaneous 3 times per day  . metoprolol tartrate  25 mg Oral BID  . sevelamer carbonate  3,200 mg Oral TID WC  . sodium chloride  3 mL Intravenous Q12H      Physical Exam: Blood pressure 137/76, pulse 94, temperature 98.8 F (37.1 C), temperature source Oral, resp. rate 18, height 5\' 7"  (1.702 m), weight 173 lb 3.2 oz (78.563 kg), SpO2 94 %.   Affect appropriate Chroncially ill black male HEENT: normal Neck supple with no adenopathy JVP normal no bruits no thyromegaly Lungs clear with no wheezing and good diaphragmatic motion Heart:  S1/S2 SEM  murmur, no rub, gallop or click PMI normal Abdomen: benighn, BS positve, no tenderness, no AAA no bruit.  No HSM or HJR Distal pulses intact with no bruits No edema Neuro non-focal Skin warm and dry  No muscular weakness   Labs:   Lab Results  Component Value Date   WBC 6.3 03/04/2015   HGB 11.3* 03/04/2015   HCT 35.6* 03/04/2015   MCV 87.1 03/04/2015   PLT 132* 03/04/2015    Recent Labs Lab 03/04/15 1759  NA 138  K 3.6  CL 93*  CO2 35*  BUN 16  CREATININE 5.61*  CALCIUM 8.8*  PROT 6.8  BILITOT 1.5*  ALKPHOS 68  ALT 10*  AST 21  GLUCOSE 109*   Lab Results  Component Value Date   TROPONINI 0.26* 03/05/2015   No results found for: CHOL No results found for: HDL No results found for: LDLCALC No results found for: TRIG No results found for: CHOLHDL No results found for: LDLDIRECT    Radiology: Dg Chest 1 View  03/04/2015   CLINICAL DATA:  Altered mental status and weakness.  EXAM: CHEST  1 VIEW  COMPARISON:  03/01/2015  FINDINGS: There is moderate cardiac  enlargement. Mild pulmonary vascular congestion noted. The lung volumes are low. No pleural effusion or edema. No airspace consolidation identified.  IMPRESSION: 1. Cardiac enlargement and pulmonary vascular congestion.   Electronically Signed   By: Kerby Moors M.D.   On: 03/04/2015 17:06   Dg Chest 2 View  03/02/2015   CLINICAL DATA:  Headaches and neck pain today. Unsteady gait with onset about 6 p.m. History of diabetes, CHF, and dialysis.  EXAM: CHEST  2 VIEW  COMPARISON:  12/25/2014  FINDINGS: Mild cardiac enlargement without significant vascular congestion. No focal airspace disease or consolidation in the lungs. No blunting of costophrenic angles. No pneumothorax. Tortuous aorta.  IMPRESSION: No active cardiopulmonary disease.   Electronically Signed   By: Lucienne Capers M.D.   On: 03/02/2015 00:10   Ct Head Wo Contrast  03/01/2015   CLINICAL DATA:  Acute headache.  EXAM: CT HEAD WITHOUT CONTRAST  TECHNIQUE: Contiguous axial images were obtained from the base of the skull through the vertex without intravenous contrast.  COMPARISON:  CT scan of Dec 29, 2014.  FINDINGS: Bony calvarium appears intact. Stable right frontal and parietal encephalomalacia is noted as well as left parietal encephalomalacia, consistent with old infarctions. Old right cerebellar infarction is noted as well. No mass effect or midline shift is noted. Ventricular size is within normal limits. There is no evidence of mass lesion, hemorrhage or acute infarction.  IMPRESSION: Bilateral encephalomalacia consistent with old infarctions. No acute intracranial abnormality seen.   Electronically Signed   By: Marijo Conception, M.D.   On: 03/01/2015 21:41    EKG:  SR PVC;s  LAD  Poor R wave progression no acute change compared to 7/13   ASSESSMENT AND PLAN:  CAD:  Stable no chest pain.  Mild non evolving troponin elevation in setting of dialysis dependant renal failure not significant.  ECG no acute ST changes  Myovue 2015 non  ischemic.  Given PVCls will increase lopressor to 50 bid  Neck Pain:  Non cardiac agree with MRI  LP negative for infection.    Chol:  On statin  CRF:  Fistula LUE per nephrology   DCM:  EF 35-40%  Volume control with dialysis  Mild volume overload on exam  Signed: Jenkins Rouge 03/05/2015, 10:04 AM

## 2015-03-06 DIAGNOSIS — R7989 Other specified abnormal findings of blood chemistry: Secondary | ICD-10-CM | POA: Diagnosis not present

## 2015-03-06 DIAGNOSIS — I2510 Atherosclerotic heart disease of native coronary artery without angina pectoris: Secondary | ICD-10-CM | POA: Diagnosis not present

## 2015-03-06 DIAGNOSIS — N186 End stage renal disease: Secondary | ICD-10-CM | POA: Diagnosis not present

## 2015-03-06 DIAGNOSIS — M542 Cervicalgia: Secondary | ICD-10-CM | POA: Diagnosis not present

## 2015-03-06 DIAGNOSIS — D631 Anemia in chronic kidney disease: Secondary | ICD-10-CM | POA: Diagnosis not present

## 2015-03-06 DIAGNOSIS — I10 Essential (primary) hypertension: Secondary | ICD-10-CM | POA: Diagnosis not present

## 2015-03-06 DIAGNOSIS — N2581 Secondary hyperparathyroidism of renal origin: Secondary | ICD-10-CM | POA: Diagnosis not present

## 2015-03-06 DIAGNOSIS — M609 Myositis, unspecified: Secondary | ICD-10-CM | POA: Diagnosis not present

## 2015-03-06 NOTE — Progress Notes (Signed)
Subjective:  Denies SSCP, palpitations or Dyspnea Neck pain  Objective:  Filed Vitals:   03/05/15 1202 03/05/15 2016 03/06/15 0607 03/06/15 0759  BP: 138/79 129/75 136/72 146/78  Pulse: 81 76 79 85  Temp: 98.7 F (37.1 C) 97.7 F (36.5 C) 97.8 F (36.6 C) 97.5 F (36.4 C)  TempSrc:  Oral Oral   Resp: 19 18 18 18   Height:      Weight:      SpO2: 100% 99% 100% 99%    Intake/Output from previous day:  Intake/Output Summary (Last 24 hours) at 03/06/15 1110 Last data filed at 03/06/15 0830  Gross per 24 hour  Intake    480 ml  Output    200 ml  Net    280 ml    Physical Exam: Affect appropriate Chronically ill black male  HEENT: normal Neck decreased ROM  JVP normal no bruits no thyromegaly Lungs clear with no wheezing and good diaphragmatic motion Heart:  S1/S2 SEM murmur, no rub, gallop or click PMI normal Abdomen: benighn, BS positve, no tenderness, no AAA no bruit.  No HSM or HJR Distal pulses intact with no bruits No edema Fistula LUE  Neuro non-focal Skin warm and dry No muscular weakness   Lab Results: Basic Metabolic Panel:  Recent Labs  03/04/15 1759  NA 138  K 3.6  CL 93*  CO2 35*  GLUCOSE 109*  BUN 16  CREATININE 5.61*  CALCIUM 8.8*   Liver Function Tests:  Recent Labs  03/04/15 1759  AST 21  ALT 10*  ALKPHOS 68  BILITOT 1.5*  PROT 6.8  ALBUMIN 3.0*   CBC:  Recent Labs  03/04/15 1759  WBC 6.3  NEUTROABS 4.5  HGB 11.3*  HCT 35.6*  MCV 87.1  PLT 132*   Cardiac Enzymes:  Recent Labs  03/04/15 2316 03/05/15 0448 03/05/15 1301  TROPONINI 0.29* 0.26* 0.24*    Imaging: Dg Chest 1 View  03/04/2015   CLINICAL DATA:  Altered mental status and weakness.  EXAM: CHEST  1 VIEW  COMPARISON:  03/01/2015  FINDINGS: There is moderate cardiac enlargement. Mild pulmonary vascular congestion noted. The lung volumes are low. No pleural effusion or edema. No airspace consolidation identified.  IMPRESSION: 1. Cardiac enlargement  and pulmonary vascular congestion.   Electronically Signed   By: Kerby Moors M.D.   On: 03/04/2015 17:06   Mr Cervical Spine Wo Contrast  03/05/2015   CLINICAL DATA:  Neck pain, no radiculopathy.  EXAM: MRI CERVICAL SPINE WITHOUT CONTRAST  TECHNIQUE: Multiplanar, multisequence MR imaging of the cervical spine was performed. No intravenous contrast was administered.  COMPARISON:  None.  FINDINGS: The cervical cord is normal in size and signal. Vertebral body heights are maintained. The disc spaces are preserved. The cervical spine is normal in lordotic alignment. No static listhesis. There is diffuse marrow signal hypointensity as can be seen with end-stage renal disease. Cerebellar tonsils are normal in position. 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  There is paraspinal soft tissue edema both anteriorly and posteriorly, most severe in the prevertebral space. There is no drainable fluid collection. There is no epidural abnormality.  C2-3: No significant disc bulge. No neural foraminal stenosis. No central canal stenosis.  C3-4: No significant disc bulge. No neural foraminal stenosis. No central canal stenosis.  C4-5: No significant disc bulge. No neural foraminal stenosis. No central canal stenosis.  C5-6: No significant disc bulge. No neural foraminal stenosis. No central canal stenosis.  C6-7: Mild eccentric  left broad-based disc bulge. No neural foraminal stenosis. No central canal stenosis.  C7-T1: No significant disc bulge. No neural foraminal stenosis. No central canal stenosis.  IMPRESSION: 1. There is paraspinal soft tissue edema both anteriorly and posteriorly, most severe in the prevertebral space without a drainable fluid collection or epidural abnormality. There is no marrow signal abnormality or disc signal abnormality. There are no findings on the current MRI to suggest discitis/osteomyelitis. The soft tissue abnormality may reflect diffuse anasarca given patient's history of end-stage  renal disease versus soft tissue infection. In the setting of trauma, this would be concerning for ligamentous injury anteriorly and posteriorly.   Electronically Signed   By: Kathreen Devoid   On: 03/05/2015 12:04    Cardiac Studies:  ECG:    Telemetry: NSR no arrhythmia 03/06/2015   Echo:   Medications:   . aspirin EC  325 mg Oral Daily  . atorvastatin  10 mg Oral Daily  . heparin  5,000 Units Subcutaneous 3 times per day  . methylPREDNISolone (SOLU-MEDROL) injection  60 mg Intravenous Q12H  . metoprolol tartrate  50 mg Oral BID  . sevelamer carbonate  3,200 mg Oral TID WC  . sodium chloride  3 mL Intravenous Q12H       Assessment/Plan:  CAD: Stable no chest pain. Mild non evolving troponin elevation in setting of dialysis dependant renal failure not significant. ECG no acute ST changes Myovue 2015 non ischemic. Tolerating increased lopressor 50 bid please note on d/c orders  Neck Pain: Non cardiac agree with MRI LP negative for infection. On steroids  Chol: On statin  CRF: Fistula LUE per nephrology  Dialysis in am   DCM: EF 35-40% Volume control with dialysis Mild volume overload on exam  Will sign off   Jenkins Rouge 03/06/2015, 11:10 AM

## 2015-03-06 NOTE — Evaluation (Signed)
Physical Therapy Evaluation Patient Details Name: DENSON NICCOLI MRN: [id number] DOB: [birth year] Today's Date: 03/06/2015   History of Present Illness  Aspiration PNA and ESRD presenting to hosp with elevated creatinine and occluded cystic duct, non surgical patient.  New a-fib and controlled rate.  Clinical Impression  Pt reports that he is off balance because of being in bed too long, but it does appear that, regardless, he is not at his baseline.  Pt/family interested in HHPT to work on balance/safety/etc.     Follow Up Recommendations Home health PT    Equipment Recommendations   (encouraged him to use his [facility])    Recommendations for Other Services       Precautions / Restrictions Precautions Precautions: Fall Restrictions Weight Bearing Restrictions: No      Mobility  Bed Mobility Overal bed mobility: Independent                Transfers Overall transfer level: Independent Equipment used: None                Ambulation/Gait Ambulation/Gait assistance: Museum/gallery curator (Feet): 200 Feet Assistive device: None       General Gait Details: Pt has multiple small losses of balance, and though he does not need direct assist to remain upright he is clearly unsteady and reports he is not quite at his baseline  Stairs Stairs: Yes Stairs assistance: Min guard Stair Management: One rail Right Number of Stairs: 6    Wheelchair Mobility    Modified Rankin (Stroke Patients Only)       Balance Overall balance assessment: Modified Independent                                           Pertinent Vitals/Pain Pain Assessment:  (pt reporting mild R neck pain)    Home Living Family/patient expects to be discharged to:: Private residence Living Arrangements: Spouse/significant other;Children Available Help at Discharge: Family Type of Home: House Home Access: Stairs to enter   [employer] of Steps: 8 steps  with R rail          Prior Function Level of Independence: Independent         Comments: Pt reports that he push mows his lawn and washes his cars regularly w/o issue     Hand Dominance        Extremity/Trunk Assessment   Upper Extremity Assessment: Generalized weakness           Lower Extremity Assessment: Overall WFL for tasks assessed         Communication   Communication: No difficulties  Cognition Arousal/Alertness: Awake/alert Behavior During Therapy: WFL for tasks assessed/performed Overall Cognitive Status: Within Functional Limits for tasks assessed                      General Comments      Exercises        Assessment/Plan    PT Assessment Patient needs continued PT services  PT Diagnosis Difficulty walking;Generalized weakness   PT Problem List Decreased strength;Decreased balance;Decreased mobility;Decreased coordination;Decreased safety awareness  PT Treatment Interventions Gait training;Therapeutic activities;Therapeutic exercise;Balance training;Neuromuscular re-education   PT Goals (Current goals can be found in the Care Plan section) Acute Rehab PT Goals Patient Stated Goal: "I want to go home" PT Goal Formulation: With patient/family Time For Goal Achievement: 03/20/15 Potential to  Achieve Goals: Good    Frequency Min 2X/week   Barriers to discharge        Co-evaluation               End of Session Equipment Utilized During Treatment: Gait belt               Time: 6004-5997 PT Time Calculation (min) (ACUTE ONLY): 14 min   Charges:   PT Evaluation $Initial PT Evaluation Tier I: 1 Procedure     PT G Codes:       Wayne Both, PT, DPT [phone]  Kreg Shropshire 03/06/2015, 5:30 PM

## 2015-03-06 NOTE — Progress Notes (Signed)
[facility]  ROUNDING NOTE   Subjective:   Admitted for neck pain. MRI negative. Seems to be MSK as this is improving with some movement. He has limited range of motion. LP negative for infection.  Dialysis for tomorrow.   Objective:  Vital signs in last 24 hours:  Temp:  [97.5 F (36.4 C)-98.7 F (37.1 C)] 97.5 F (36.4 C) (07/17 0759) Pulse Rate:  [76-85] 85 (07/17 0759) Resp:  [18-19] 18 (07/17 0759) BP: (129-146)/(72-79) 146/78 mmHg (07/17 0759) SpO2:  [99 %-100 %] 99 % (07/17 0759) Weight:  [78.472 kg (173 lb)] 78.472 kg (173 lb) (07/16 1115)  Weight change:  Filed Weights   03/04/15 1508 03/05/15 0107  Weight: 83.915 kg (185 lb) 78.563 kg (173 lb 3.2 oz)    Intake/Output: I/O last 3 completed shifts: In: 69 [P.O.:480; I.V.:3] Out: 200 [Urine:200]   Intake/Output this shift:     Physical Exam: General: NAD,   Head: Normocephalic, atraumatic. Moist oral mucosal membranes  Eyes: Anicteric, PERRL  Neck: Tenderness at nape of neck.   Lungs:  Clear to auscultation  Heart: Regular rate and rhythm  Abdomen:  Soft, nontender,   Extremities:  no peripheral edema.  Neurologic: Nonfocal, moving all four extremities  Skin: No lesions  Access: Right arm AVF    Basic Metabolic Panel:  Recent Labs Lab 03/01/15 2116 03/04/15 1759  NA 137 138  K 3.2* 3.6  CL 96* 93*  CO2 31 35*  GLUCOSE 155* 109*  BUN 20 16  CREATININE 7.98* 5.61*  CALCIUM 9.0 8.8*    Liver Function Tests:  Recent Labs Lab 03/04/15 1759  AST 21  ALT 10*  ALKPHOS 68  BILITOT 1.5*  PROT 6.8  ALBUMIN 3.0*   No results for input(s): LIPASE, AMYLASE in the last 168 hours. No results for input(s): AMMONIA in the last 168 hours.  CBC:  Recent Labs Lab 03/01/15 2116 03/04/15 1759  WBC 4.9 6.3  NEUTROABS  --  4.5  HGB 10.3* 11.3*  HCT 32.5* 35.6*  MCV 88.1 87.1  PLT 160 132*    Cardiac Enzymes:  Recent Labs Lab 03/02/15 0036 03/04/15 1759 03/04/15 2316  03/05/15 0448 03/05/15 1301  TROPONINI 0.09* 0.28* 0.29* 0.26* 0.24*    BNP: Invalid input(s): POCBNP  CBG:  Recent Labs Lab 03/04/15 1747 03/05/15 0448 03/05/15 2016  GLUCAP 111* 119* 68*    Microbiology: Results for orders placed or performed during the hospital encounter of 03/04/15  Blood culture (routine x 2)     Status: None (Preliminary result)   Collection Time: 03/04/15  5:13 PM  Result Value Ref Range Status   Specimen Description BLOOD RIGHT HAND  Final   Special Requests BOTTLES DRAWN AEROBIC AND ANAEROBIC ANA5ML,AER5ML  Final   Culture NO GROWTH < 24 HOURS  Final   Report Status PENDING  Incomplete  Blood culture (routine x 2)     Status: None (Preliminary result)   Collection Time: 03/04/15  5:35 PM  Result Value Ref Range Status   Specimen Description BLOOD RIGHT ARM  Final   Special Requests [id number],ANA5ML  Final   Culture NO GROWTH < 24 HOURS  Final   Report Status PENDING  Incomplete  CSF culture with Stat gram stain     Status: None (Preliminary result)   Collection Time: 03/04/15  7:27 PM  Result Value Ref Range Status   Specimen Description CSF  Final   Special Requests Normal  Final   Gram Stain  Final    NO ORGANISMS SEEN FEW WBC SEEN MANY RBC SEEN CONFIRMED BY RWW    Culture NO GROWTH <12 HOURS  Final   Report Status PENDING  Incomplete    Coagulation Studies: No results for input(s): LABPROT, INR in the last 72 hours.  Urinalysis: No results for input(s): COLORURINE, LABSPEC, PHURINE, GLUCOSEU, HGBUR, BILIRUBINUR, KETONESUR, PROTEINUR, UROBILINOGEN, NITRITE, LEUKOCYTESUR in the last 72 hours.  Invalid input(s): APPERANCEUR    Imaging: Dg Chest 1 View  03/04/2015   CLINICAL DATA:  Altered mental status and weakness.  EXAM: CHEST  1 VIEW  COMPARISON:  03/01/2015  FINDINGS: There is moderate cardiac enlargement. Mild pulmonary vascular congestion noted. The lung volumes are low. No pleural effusion or edema. No airspace  consolidation identified.  IMPRESSION: 1. Cardiac enlargement and pulmonary vascular congestion.   Electronically Signed   By: Kerby Moors M.D.   On: 03/04/2015 17:06   Mr Cervical Spine Wo Contrast  03/05/2015   CLINICAL DATA:  Neck pain, no radiculopathy.  EXAM: MRI CERVICAL SPINE WITHOUT CONTRAST  TECHNIQUE: Multiplanar, multisequence MR imaging of the cervical spine was performed. No intravenous contrast was administered.  COMPARISON:  None.  FINDINGS: The cervical cord is normal in size and signal. Vertebral body heights are maintained. The disc spaces are preserved. The cervical spine is normal in lordotic alignment. No static listhesis. There is diffuse marrow signal hypointensity as can be seen with end-stage renal disease. Cerebellar tonsils are normal in position. 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  There is paraspinal soft tissue edema both anteriorly and posteriorly, most severe in the prevertebral space. There is no drainable fluid collection. There is no epidural abnormality.  C2-3: No significant disc bulge. No neural foraminal stenosis. No central canal stenosis.  C3-4: No significant disc bulge. No neural foraminal stenosis. No central canal stenosis.  C4-5: No significant disc bulge. No neural foraminal stenosis. No central canal stenosis.  C5-6: No significant disc bulge. No neural foraminal stenosis. No central canal stenosis.  C6-7: Mild eccentric left broad-based disc bulge. No neural foraminal stenosis. No central canal stenosis.  C7-T1: No significant disc bulge. No neural foraminal stenosis. No central canal stenosis.  IMPRESSION: 1. There is paraspinal soft tissue edema both anteriorly and posteriorly, most severe in the prevertebral space without a drainable fluid collection or epidural abnormality. There is no marrow signal abnormality or disc signal abnormality. There are no findings on the current MRI to suggest discitis/osteomyelitis. The soft tissue abnormality may  reflect diffuse anasarca given patient's history of end-stage renal disease versus soft tissue infection. In the setting of trauma, this would be concerning for ligamentous injury anteriorly and posteriorly.   Electronically Signed   By: Kathreen Devoid   On: 03/05/2015 12:04     Medications:     . aspirin EC  325 mg Oral Daily  . atorvastatin  10 mg Oral Daily  . heparin  5,000 Units Subcutaneous 3 times per day  . methylPREDNISolone (SOLU-MEDROL) injection  60 mg Intravenous Q12H  . metoprolol tartrate  50 mg Oral BID  . sevelamer carbonate  3,200 mg Oral TID WC  . sodium chloride  3 mL Intravenous Q12H   acetaminophen **OR** acetaminophen, cyclobenzaprine, morphine injection, ondansetron **OR** ondansetron (ZOFRAN) IV  Assessment/ Plan:  Mr. KATSUMI WISLER is a 64 y.o. black male with End stage renal disease, hypertension, diabetes mellitus type II, CVA, coronary artery disease who presents to [facility] on 12/25/2014 for right lower lobe pneumonia. Now  admitted to [facility] on 01/23/2015 for C. Diff colitis.   [facility]/ [address] MWF  1. End Stage Renal Disease: hemodialysis on Friday. Will plan on hemodialysis tomorrow and continue MWF schedule. Orders prepared.  - history of hypokalemia  2. Hypertension: blood pressure at goal. Continue metoprolol  3. Neck pain: MRI reviewed with patient, now on steroids. LP negative.   4. Anemia of chronic kidney disease: hemoglobin 11.3 - no indication for epo   5. Secondary Hyperparathyroidism:  - Continue sevelamer    LOS:  Troi Florendo 7/17/20169:29 AM

## 2015-03-06 NOTE — Progress Notes (Signed)
[place] at [facility]                                                                                                                                                                                            Patient Demographics   Angelica Wix, is a 64 y.o. male, DOB - September 20, 1950, QBH:[id number]  Admit date - 03/04/2015   Admitting Physician Lytle Butte, MD  Outpatient Primary MD for the patient is No PCP Per Patient   LOS -   Subjective: neck pain improved   Review of Systems:   CONSTITUTIONAL: No documented fever. No fatigue, weakness. No weight gain, no weight loss.  EYES: No blurry or double vision.  ENT: No tinnitus. No postnasal drip. No redness of the oropharynx.  RESPIRATORY: No cough, no wheeze, no hemoptysis. No dyspnea.  CARDIOVASCULAR: No chest pain. No orthopnea. No palpitations. No syncope.  GASTROINTESTINAL: No nausea, no vomiting or diarrhea. No abdominal pain. No melena or hematochezia.  GENITOURINARY: No dysuria or hematuria.  ENDOCRINE: No polyuria or nocturia. No heat or cold intolerance.  HEMATOLOGY: No anemia. No bruising. No bleeding.  INTEGUMENTARY: No rashes. No lesions.  MUSCULOSKELETAL: Positive neck pain NEUROLOGIC: No numbness, tingling, or ataxia. No seizure-type activity.  PSYCHIATRIC: No anxiety. No insomnia. No ADD.    Vitals:   Filed Vitals:   03/05/15 2016 03/06/15 0607 03/06/15 0759 03/06/15 1137  BP: 129/75 136/72 146/78 144/86  Pulse: 76 79 85 83  Temp: 97.7 F (36.5 C) 97.8 F (36.6 C) 97.5 F (36.4 C) 97.8 F (36.6 C)  TempSrc: Oral Oral  Oral  Resp: 18 18 18 19   Height:      Weight:      SpO2: 99% 100% 99% 99%    Wt Readings from Last 3 Encounters:  03/05/15 78.563 kg (173 lb 3.2 oz)  03/05/15 78.472 kg (173 lb)  03/01/15 83.915 kg (185 lb)     Intake/Output Summary (Last 24 hours) at 03/06/15 1152 Last data filed at 03/06/15 0830  Gross per 24 hour  Intake    240 ml  Output     200 ml  Net     40 ml    Physical Exam:   GENERAL: Pleasant-appearing in no apparent distress.  HEAD, EYES, EARS, NOSE AND THROAT: Atraumatic, normocephalic. Extraocular muscles are intact. Pupils equal and reactive to light. Sclerae anicteric. No conjunctival injection. No oro-pharyngeal erythema.  NECK: Supple. Tenderness in the posterior neck with limited range of motion HEART: Regular rate and rhythm, tachycardic. No murmurs, no rubs, no clicks.  LUNGS: Clear to auscultation bilaterally. No rales  or rhonchi. No wheezes.  ABDOMEN: Soft, flat, nontender, nondistended. Has good bowel sounds. No hepatosplenomegaly appreciated.  EXTREMITIES: No evidence of any cyanosis, clubbing, or peripheral edema.  +2 pedal and radial pulses bilaterally.  NEUROLOGIC: The patient is alert, awake, and oriented x3 with no focal motor or sensory deficits appreciated bilaterally.  SKIN: Moist and warm with no rashes appreciated.  Psych: Not anxious, depressed LN: No inguinal LN enlargement    Antibiotics   Anti-infectives    None      Medications   Scheduled Meds: . aspirin EC  325 mg Oral Daily  . atorvastatin  10 mg Oral Daily  . heparin  5,000 Units Subcutaneous 3 times per day  . methylPREDNISolone (SOLU-MEDROL) injection  60 mg Intravenous Q12H  . metoprolol tartrate  50 mg Oral BID  . sevelamer carbonate  3,200 mg Oral TID WC  . sodium chloride  3 mL Intravenous Q12H   Continuous Infusions:  PRN Meds:.acetaminophen **OR** acetaminophen, cyclobenzaprine, morphine injection, ondansetron **OR** ondansetron (ZOFRAN) IV   Data Review:   Micro Results Recent Results (from the past 240 hour(s))  Blood culture (routine x 2)     Status: None (Preliminary result)   Collection Time: 03/04/15  5:13 PM  Result Value Ref Range Status   Specimen Description BLOOD RIGHT HAND  Final   Special Requests BOTTLES DRAWN AEROBIC AND ANAEROBIC ANA5ML,AER5ML  Final   Culture NO GROWTH 2 DAYS  Final    Report Status PENDING  Incomplete  Blood culture (routine x 2)     Status: None (Preliminary result)   Collection Time: 03/04/15  5:35 PM  Result Value Ref Range Status   Specimen Description BLOOD RIGHT ARM  Final   Special Requests [id number],ANA5ML  Final   Culture NO GROWTH 2 DAYS  Final   Report Status PENDING  Incomplete  CSF culture with Stat gram stain     Status: None (Preliminary result)   Collection Time: 03/04/15  7:27 PM  Result Value Ref Range Status   Specimen Description CSF  Final   Special Requests Normal  Final   Gram Stain   Final    NO ORGANISMS SEEN FEW WBC SEEN MANY RBC SEEN CONFIRMED BY RWW    Culture NO GROWTH <12 HOURS  Final   Report Status PENDING  Incomplete    Radiology Reports Dg Chest 1 View  03/04/2015   CLINICAL DATA:  Altered mental status and weakness.  EXAM: CHEST  1 VIEW  COMPARISON:  03/01/2015  FINDINGS: There is moderate cardiac enlargement. Mild pulmonary vascular congestion noted. The lung volumes are low. No pleural effusion or edema. No airspace consolidation identified.  IMPRESSION: 1. Cardiac enlargement and pulmonary vascular congestion.   Electronically Signed   By: Kerby Moors M.D.   On: 03/04/2015 17:06   Dg Chest 2 View  03/02/2015   CLINICAL DATA:  Headaches and neck pain today. Unsteady gait with onset about 6 p.m. History of diabetes, CHF, and dialysis.  EXAM: CHEST  2 VIEW  COMPARISON:  12/25/2014  FINDINGS: Mild cardiac enlargement without significant vascular congestion. No focal airspace disease or consolidation in the lungs. No blunting of costophrenic angles. No pneumothorax. Tortuous aorta.  IMPRESSION: No active cardiopulmonary disease.   Electronically Signed   By: Lucienne Capers M.D.   On: 03/02/2015 00:10   Ct Head Wo Contrast  03/01/2015   CLINICAL DATA:  Acute headache.  EXAM: CT HEAD WITHOUT CONTRAST  TECHNIQUE: Contiguous axial images were obtained from  the base of the skull through the vertex without intravenous  contrast.  COMPARISON:  CT scan of Dec 29, 2014.  FINDINGS: Bony calvarium appears intact. Stable right frontal and parietal encephalomalacia is noted as well as left parietal encephalomalacia, consistent with old infarctions. Old right cerebellar infarction is noted as well. No mass effect or midline shift is noted. Ventricular size is within normal limits. There is no evidence of mass lesion, hemorrhage or acute infarction.  IMPRESSION: Bilateral encephalomalacia consistent with old infarctions. No acute intracranial abnormality seen.   Electronically Signed   By: Marijo Conception, M.D.   On: 03/01/2015 21:41   Mr Cervical Spine Wo Contrast  03/05/2015   CLINICAL DATA:  Neck pain, no radiculopathy.  EXAM: MRI CERVICAL SPINE WITHOUT CONTRAST  TECHNIQUE: Multiplanar, multisequence MR imaging of the cervical spine was performed. No intravenous contrast was administered.  COMPARISON:  None.  FINDINGS: The cervical cord is normal in size and signal. Vertebral body heights are maintained. The disc spaces are preserved. The cervical spine is normal in lordotic alignment. No static listhesis. There is diffuse marrow signal hypointensity as can be seen with end-stage renal disease. Cerebellar tonsils are normal in position. 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  There is paraspinal soft tissue edema both anteriorly and posteriorly, most severe in the prevertebral space. There is no drainable fluid collection. There is no epidural abnormality.  C2-3: No significant disc bulge. No neural foraminal stenosis. No central canal stenosis.  C3-4: No significant disc bulge. No neural foraminal stenosis. No central canal stenosis.  C4-5: No significant disc bulge. No neural foraminal stenosis. No central canal stenosis.  C5-6: No significant disc bulge. No neural foraminal stenosis. No central canal stenosis.  C6-7: Mild eccentric left broad-based disc bulge. No neural foraminal stenosis. No central canal stenosis.   C7-T1: No significant disc bulge. No neural foraminal stenosis. No central canal stenosis.  IMPRESSION: 1. There is paraspinal soft tissue edema both anteriorly and posteriorly, most severe in the prevertebral space without a drainable fluid collection or epidural abnormality. There is no marrow signal abnormality or disc signal abnormality. There are no findings on the current MRI to suggest discitis/osteomyelitis. The soft tissue abnormality may reflect diffuse anasarca given patient's history of end-stage renal disease versus soft tissue infection. In the setting of trauma, this would be concerning for ligamentous injury anteriorly and posteriorly.   Electronically Signed   By: Kathreen Devoid   On: 03/05/2015 12:04     CBC  Recent Labs Lab 03/01/15 2116 03/04/15 1759  WBC 4.9 6.3  HGB 10.3* 11.3*  HCT 32.5* 35.6*  PLT 160 132*  MCV 88.1 87.1  MCH 27.9 27.6  MCHC 31.7* 31.7*  RDW 18.0* 17.9*  LYMPHSABS  --  1.0  MONOABS  --  0.7  EOSABS  --  0.0  BASOSABS  --  0.1    Chemistries   Recent Labs Lab 03/01/15 2116 03/04/15 1759  NA 137 138  K 3.2* 3.6  CL 96* 93*  CO2 31 35*  GLUCOSE 155* 109*  BUN 20 16  CREATININE 7.98* 5.61*  CALCIUM 9.0 8.8*  AST  --  21  ALT  --  10*  ALKPHOS  --  68  BILITOT  --  1.5*   ------------------------------------------------------------------------------------------------------------------ estimated creatinine clearance is 12.9 mL/min (by C-G formula based on Cr of 5.61). ------------------------------------------------------------------------------------------------------------------ No results for input(s): HGBA1C in the last 72 hours. ------------------------------------------------------------------------------------------------------------------ No results for input(s): CHOL, HDL, LDLCALC, TRIG, CHOLHDL,  LDLDIRECT in the last 72  hours. ------------------------------------------------------------------------------------------------------------------ No results for input(s): TSH, T4TOTAL, T3FREE, THYROIDAB in the last 72 hours.  Invalid input(s): FREET3 ------------------------------------------------------------------------------------------------------------------ No results for input(s): VITAMINB12, FOLATE, FERRITIN, TIBC, IRON, RETICCTPCT in the last 72 hours.  Coagulation profile No results for input(s): INR, PROTIME in the last 168 hours.  No results for input(s): DDIMER in the last 72 hours.  Cardiac Enzymes  Recent Labs Lab 03/04/15 2316 03/05/15 0448 03/05/15 1301  TROPONINI 0.29* 0.26* 0.24*   ------------------------------------------------------------------------------------------------------------------ Invalid input(s): POCBNP    Assessment & Plan   64 year old African-American gentleman history of end-stage renal disease with dialysis, coronary artery disease, systolic congestive heart failure presenting with progressively worsening neck pain.  1. Cervicalgia: Status post MRI with inflammation noted in the paraspinal muscles, no evidence of abscess unfortunately not drainable amount of fluid. Responding to stairways so we'll continue these 2. Elevated troponin: Felt to be due to demand ischemia and chronic renal failure appreciate cardiology input 3. End-stage renal disease on hemodialysis: Monday Wednesday Friday, consult nephrology 4. Coronary artery disease without angina: Continue aspirin and statin therapy 5. Venous thrombolic embolism prophylactic: Heparin subcutaneous       Code Status Orders        Start     Ordered   03/04/15 2253  Full code   Continuous     03/04/15 2253           Consults  cardiology DVT Prophylaxis heparin  Lab Results  Component Value Date   PLT 132* 03/04/2015     Time Spent in minutes   11min  Greater than 50% of time spent in care  coordination and counseling.   Dustin Flock M.D on 03/06/2015 at 11:52 AM  Between 7am to 6pm - Pager - [phone]  After 6pm go to [email or web address] - password [email or web address] [facility]   Office  [phone]

## 2015-03-07 ENCOUNTER — Telehealth: Payer: Self-pay | Admitting: General Practice

## 2015-03-07 DIAGNOSIS — M542 Cervicalgia: Secondary | ICD-10-CM | POA: Diagnosis not present

## 2015-03-07 DIAGNOSIS — M609 Myositis, unspecified: Secondary | ICD-10-CM | POA: Diagnosis not present

## 2015-03-07 DIAGNOSIS — I2510 Atherosclerotic heart disease of native coronary artery without angina pectoris: Secondary | ICD-10-CM | POA: Diagnosis not present

## 2015-03-07 DIAGNOSIS — N2581 Secondary hyperparathyroidism of renal origin: Secondary | ICD-10-CM | POA: Diagnosis not present

## 2015-03-07 DIAGNOSIS — I10 Essential (primary) hypertension: Secondary | ICD-10-CM | POA: Diagnosis not present

## 2015-03-07 DIAGNOSIS — R7989 Other specified abnormal findings of blood chemistry: Secondary | ICD-10-CM | POA: Diagnosis not present

## 2015-03-07 DIAGNOSIS — D631 Anemia in chronic kidney disease: Secondary | ICD-10-CM | POA: Diagnosis not present

## 2015-03-07 DIAGNOSIS — N186 End stage renal disease: Secondary | ICD-10-CM | POA: Diagnosis not present

## 2015-03-07 LAB — RENAL FUNCTION PANEL
Albumin: 3 g/dL — ABNORMAL LOW (ref 3.5–5.0)
Anion gap: 12 (ref 5–15)
BUN: 54 mg/dL — ABNORMAL HIGH (ref 6–20)
CO2: 30 mmol/L (ref 22–32)
Calcium: 8.8 mg/dL — ABNORMAL LOW (ref 8.9–10.3)
Chloride: 95 mmol/L — ABNORMAL LOW (ref 101–111)
Creatinine, Ser: 9.99 mg/dL — ABNORMAL HIGH (ref 0.61–1.24)
GFR, EST AFRICAN AMERICAN: 6 mL/min — AB (ref >60)
GFR, EST NON AFRICAN AMERICAN: 5 mL/min — AB (ref >60)
Glucose, Bld: 334 mg/dL — ABNORMAL HIGH (ref 65–99)
Phosphorus: 6 mg/dL — ABNORMAL HIGH (ref 2.5–4.6)
Potassium: 4.4 mmol/L (ref 3.5–5.1)
Sodium: 137 mmol/L (ref 135–145)

## 2015-03-07 LAB — CSF CULTURE: Gram Stain: NONE SEEN

## 2015-03-07 LAB — CBC
HCT: 35.8 % — ABNORMAL LOW (ref 40.0–52.0)
Hemoglobin: 11.4 g/dL — ABNORMAL LOW (ref 13.0–18.0)
MCH: 27.5 pg (ref 26.0–34.0)
MCHC: 31.9 g/dL — ABNORMAL LOW (ref 32.0–36.0)
MCV: 86.1 fL (ref 80.0–100.0)
Platelets: 144 10*3/uL — ABNORMAL LOW (ref 150–440)
RBC: 4.15 MIL/uL — ABNORMAL LOW (ref 4.40–5.90)
RDW: 17.2 % — ABNORMAL HIGH (ref 11.5–14.5)
WBC: 5.1 10*3/uL (ref 3.8–10.6)

## 2015-03-07 LAB — CSF CULTURE W GRAM STAIN
Culture: NO GROWTH
Special Requests: NORMAL

## 2015-03-07 MED ORDER — PREDNISONE 10 MG PO TABS: ORAL_TABLET | ORAL | Status: DC

## 2015-03-07 MED ORDER — OXYCODONE-ACETAMINOPHEN 5-325 MG PO TABS: 1.0000 | ORAL_TABLET | Freq: Three times a day (TID) | ORAL | Status: DC | PRN

## 2015-03-07 NOTE — Telephone Encounter (Signed)
No, he never would keep his follow-up appointments.

## 2015-03-07 NOTE — Progress Notes (Signed)
HD tx start 

## 2015-03-07 NOTE — Progress Notes (Signed)
Patient discharge home as per order, IV removed tele removed, discharge instruction provided.

## 2015-03-07 NOTE — Telephone Encounter (Signed)
Sherry at [facility]

## 2015-03-07 NOTE — Progress Notes (Signed)
Pre-hd tx 

## 2015-03-07 NOTE — Telephone Encounter (Signed)
Per alscripts this is the patient that missed and rescheduled appts back to back and last note we made was per you not to accept patient

## 2015-03-07 NOTE — Discharge Instructions (Signed)
°  DIET:  °Renal diet ° °DISCHARGE CONDITION:  °Stable ° °ACTIVITY:  °Activity as tolerated ° °OXYGEN:  °Home Oxygen: No. °  °Oxygen Delivery: room air ° °DISCHARGE LOCATION:  °home  ° ° °ADDITIONAL DISCHARGE INSTRUCTION: ° ° °If you experience worsening of your admission symptoms, develop shortness of breath, life threatening emergency, suicidal or homicidal thoughts you must seek medical attention immediately by calling 911 or calling your MD immediately  if symptoms less severe. ° °You Must read complete instructions/literature along with all the possible adverse reactions/side effects for all the Medicines you take and that have been prescribed to you. Take any new Medicines after you have completely understood and accpet all the possible adverse reactions/side effects.  ° °Please note ° °You were cared for by a hospitalist during your hospital stay. If you have any questions about your discharge medications or the care you received while you were in the hospital after you are discharged, you can call the unit and asked to speak with the hospitalist on call if the hospitalist that took care of you is not available. Once you are discharged, your primary care physician will handle any further medical issues. Please note that NO REFILLS for any discharge medications will be authorized once you are discharged, as it is imperative that you return to your primary care physician (or establish a relationship with a primary care physician if you do not have one) for your aftercare needs so that they can reassess your need for medications and monitor your lab values. ° ° °

## 2015-03-07 NOTE — Progress Notes (Signed)
[facility]  ROUNDING NOTE   Subjective:  Pt due for HD today. Orders were prepared.  In good spirits this AM.  Objective:  Vital signs in last 24 hours:  Temp:  [97.7 F (36.5 C)-98.7 F (37.1 C)] 98.7 F (37.1 C) (07/18 0915) Pulse Rate:  [68-79] 79 (07/18 1200) Resp:  [16-19] 18 (07/18 1200) BP: (122-138)/(67-88) 128/88 mmHg (07/18 1200) SpO2:  [90 %-99 %] 98 % (07/18 1200) Weight:  [79.2 kg (174 lb 9.7 oz)] 79.2 kg (174 lb 9.7 oz) (07/18 0915)  Weight change:  Filed Weights   03/04/15 1508 03/05/15 0107 03/07/15 0915  Weight: 83.915 kg (185 lb) 78.563 kg (173 lb 3.2 oz) 79.2 kg (174 lb 9.7 oz)    Intake/Output: I/O last 3 completed shifts: In: 480 [P.O.:480] Out: 200 [Urine:200]   Intake/Output this shift:     Physical Exam: General: NAD  Head: Normocephalic, atraumatic. Moist oral mucosal membranes  Eyes: Anicteric  Neck: Neck tenderness noted  Lungs:  Clear to auscultation normal effort  Heart: Regular rate and rhythm  Abdomen:  Soft, nontender, BS present  Extremities:  no peripheral edema.  Neurologic: Nonfocal, moving all four extremities  Skin: No lesions  Access: Right arm AVF    Basic Metabolic Panel:  Recent Labs Lab 03/01/15 2116 03/04/15 1759 03/07/15 0953  NA 137 138 137  K 3.2* 3.6 4.4  CL 96* 93* 95*  CO2 31 35* 30  GLUCOSE 155* 109* 334*  BUN 20 16 54*  CREATININE 7.98* 5.61* 9.99*  CALCIUM 9.0 8.8* 8.8*  PHOS  --   --  6.0*    Liver Function Tests:  Recent Labs Lab 03/04/15 1759 03/07/15 0953  AST 21  --   ALT 10*  --   ALKPHOS 68  --   BILITOT 1.5*  --   PROT 6.8  --   ALBUMIN 3.0* 3.0*   No results for input(s): LIPASE, AMYLASE in the last 168 hours. No results for input(s): AMMONIA in the last 168 hours.  CBC:  Recent Labs Lab 03/01/15 2116 03/04/15 1759 03/07/15 0953  WBC 4.9 6.3 5.1  NEUTROABS  --  4.5  --   HGB 10.3* 11.3* 11.4*  HCT 32.5* 35.6* 35.8*  MCV 88.1 87.1 86.1  PLT 160 132*  144*    Cardiac Enzymes:  Recent Labs Lab 03/02/15 0036 03/04/15 1759 03/04/15 2316 03/05/15 0448 03/05/15 1301  TROPONINI 0.09* 0.28* 0.29* 0.26* 0.24*    BNP: Invalid input(s): POCBNP  CBG:  Recent Labs Lab 03/04/15 1747 03/05/15 0448 03/05/15 2016  GLUCAP 111* 119* 59*    Microbiology: Results for orders placed or performed during the hospital encounter of 03/04/15  Blood culture (routine x 2)     Status: None (Preliminary result)   Collection Time: 03/04/15  5:13 PM  Result Value Ref Range Status   Specimen Description BLOOD RIGHT HAND  Final   Special Requests BOTTLES DRAWN AEROBIC AND ANAEROBIC ANA5ML,AER5ML  Final   Culture NO GROWTH 3 DAYS  Final   Report Status PENDING  Incomplete  Blood culture (routine x 2)     Status: None (Preliminary result)   Collection Time: 03/04/15  5:35 PM  Result Value Ref Range Status   Specimen Description BLOOD RIGHT ARM  Final   Special Requests BAA5MLAER,ANA5ML  Final   Culture NO GROWTH 3 DAYS  Final   Report Status PENDING  Incomplete  CSF culture with Stat gram stain     Status: None  Collection Time: 03/04/15  7:27 PM  Result Value Ref Range Status   Specimen Description CSF  Final   Special Requests Normal  Final   Gram Stain   Final    NO ORGANISMS SEEN FEW WBC SEEN MANY RBC SEEN CONFIRMED BY RWW    Culture NO GROWTH 3 DAYS  Final   Report Status 03/07/2015 FINAL  Final    Coagulation Studies: No results for input(s): LABPROT, INR in the last 72 hours.  Urinalysis: No results for input(s): COLORURINE, LABSPEC, PHURINE, GLUCOSEU, HGBUR, BILIRUBINUR, KETONESUR, PROTEINUR, UROBILINOGEN, NITRITE, LEUKOCYTESUR in the last 72 hours.  Invalid input(s): APPERANCEUR    Imaging: No results found.   Medications:     . aspirin EC  325 mg Oral Daily  . atorvastatin  10 mg Oral Daily  . heparin  5,000 Units Subcutaneous 3 times per day  . methylPREDNISolone (SOLU-MEDROL) injection  60 mg Intravenous Q12H   . metoprolol tartrate  50 mg Oral BID  . sevelamer carbonate  3,200 mg Oral TID WC  . sodium chloride  3 mL Intravenous Q12H   acetaminophen **OR** acetaminophen, cyclobenzaprine, morphine injection, ondansetron **OR** ondansetron (ZOFRAN) IV  Assessment/ Plan:  [name] is a 64 y.o. black male with End stage renal disease, hypertension, diabetes mellitus type II, CVA, coronary artery disease, right lower lobe pneumonia, hx of C. Diff colitis, admitted with neck pain   [facility]/ [facility] [address]MWF  1. End Stage Renal Disease on HD MWF:  Pt due for HD today, orders prepared.   2. Hypertension: BP 128/88, continue metoprolol.  3. Neck pain: MRI reviewed with patient, now on steroids. LP negative.   4. Anemia of chronic kidney disease: hgb 11.4 at last check, hold off on ESAs for now.  5. Secondary Hyperparathyroidism:  - phos found to be 6.0 at present, continue renvela 3200mg  po TID.     LOS:  Luis Walter 7/18/201612:27 PM

## 2015-03-07 NOTE — Discharge Summary (Signed)
Luis Walter, 64 y.o., DOB 04/16/1951, MRN [id number]. Admission date: 03/04/2015 Discharge Date 03/07/2015 Primary MD No PCP Per Patient Admitting Physician Lytle Butte, MD  Admission Diagnosis  Neck pain [M54.2] Weakness generalized [R53.1] Elevated troponin [R79.89] Ambulatory dysfunction [R26.2] Other specified fever [R50.9] Acute nonintractable headache, unspecified headache type [R51]  Discharge Diagnosis   Active Problems:   Non specific paraspinal muscle inflammation  End-stage renal disease          Hospital Course  Luis Walter is a 64 y.o. 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 He underwent MRI of the brain which showed paraspinal soft tissue edema both anteriorly and posteriorly. It was felt to be nonspecific by radiology there was no evidence of abscess and it was not drainable. Patient was started on IV steroids and his pain was controlled his symptoms have mostly now resolved with the treatment. He'll be discharged on oral prednisone therapy. Patient also had was noted to have elevated troponin and was seen by cardiology who felt that this was due to his end-stage renal disease and not cardiac in nature.            Consults  cardiology  Significant Tests:  See full reports for all details    Dg Chest 1 View  03/04/2015   CLINICAL DATA:  Altered mental status and weakness.  EXAM: CHEST  1 VIEW  COMPARISON:  03/01/2015  FINDINGS: There is moderate cardiac enlargement. Mild pulmonary vascular congestion noted. The lung volumes are low. No pleural effusion or edema. No airspace consolidation identified.  IMPRESSION: 1. Cardiac enlargement and pulmonary vascular congestion.   Electronically Signed    By: Kerby Moors M.D.   On: 03/04/2015 17:06   Dg Chest 2 View  03/02/2015   CLINICAL DATA:  Headaches and neck pain today. Unsteady gait with onset about 6 p.m. History of diabetes, CHF, and dialysis.  EXAM: CHEST  2 VIEW  COMPARISON:  12/25/2014  FINDINGS: Mild cardiac enlargement without significant vascular congestion. No focal airspace disease or consolidation in the lungs. No blunting of costophrenic angles. No pneumothorax. Tortuous aorta.  IMPRESSION: No active cardiopulmonary disease.   Electronically Signed   By: Lucienne Capers M.D.   On: 03/02/2015 00:10   Ct Head Wo Contrast  03/01/2015   CLINICAL DATA:  Acute headache.  EXAM: CT HEAD WITHOUT CONTRAST  TECHNIQUE: Contiguous axial images were obtained from the base of the skull through the vertex without intravenous contrast.  COMPARISON:  CT scan of Dec 29, 2014.  FINDINGS: Bony calvarium appears intact. Stable right frontal and parietal encephalomalacia is noted as well as left parietal encephalomalacia, consistent with old infarctions. Old right cerebellar infarction is noted as well. No mass effect or midline shift is noted. Ventricular size is within normal limits. There is no evidence of mass lesion, hemorrhage or acute infarction.  IMPRESSION: Bilateral encephalomalacia consistent with old infarctions. No acute intracranial abnormality seen.   Electronically Signed   By: Marijo Conception, M.D.   On: 03/01/2015 21:41   Mr Cervical Spine Wo Contrast  03/05/2015   CLINICAL DATA:  Neck pain, no radiculopathy.  EXAM: MRI CERVICAL SPINE WITHOUT CONTRAST  TECHNIQUE: Multiplanar, multisequence MR imaging of the cervical spine was performed. No intravenous contrast was administered.  COMPARISON:  None.  FINDINGS: The cervical cord is normal in size and signal. Vertebral body heights are maintained. The disc spaces are preserved. The cervical spine is normal in lordotic alignment. No static listhesis. There is diffuse marrow signal hypointensity  as can be seen with end-stage renal disease. Cerebellar tonsils are normal in position. 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  There is paraspinal soft tissue edema both anteriorly and posteriorly, most severe in the prevertebral space. There is no drainable fluid collection. There is no epidural abnormality.  C2-3: No significant disc bulge. No neural foraminal stenosis. No central canal stenosis.  C3-4: No significant disc bulge. No neural foraminal stenosis. No central canal stenosis.  C4-5: No significant disc bulge. No neural foraminal stenosis. No central canal stenosis.  C5-6: No significant disc bulge. No neural foraminal stenosis. No central canal stenosis.  C6-7: Mild eccentric left broad-based disc bulge. No neural foraminal stenosis. No central canal stenosis.  C7-T1: No significant disc bulge. No neural foraminal stenosis. No central canal stenosis.  IMPRESSION: 1. There is paraspinal soft tissue edema both anteriorly and posteriorly, most severe in the prevertebral space without a drainable fluid collection or epidural abnormality. There is no marrow signal abnormality or disc signal abnormality. There are no findings on the current MRI to suggest discitis/osteomyelitis. The soft tissue abnormality may reflect diffuse anasarca given patient's history of end-stage renal disease versus soft tissue infection. In the setting of trauma, this would be concerning for ligamentous injury anteriorly and posteriorly.   Electronically Signed   By: Kathreen Devoid   On: 03/05/2015 12:04       Today   Subjective:   Luis Walter  feels much better states that his neck pain is now resolved  Objective:   Blood pressure 127/87, pulse 80, temperature 98.7 F (37.1 C), temperature source Oral, resp. rate 18, height 5\' 7"  (1.702 m), weight 79.2 kg (174 lb 9.7 oz), SpO2 99 %.  .  Intake/Output Summary (Last 24 hours) at 03/07/15 1242 Last data filed at 03/07/15 0540  Gross per 24 hour  Intake     240 ml  Output      0 ml  Net    240 ml    Exam VITAL SIGNS: Blood pressure 127/87, pulse 80, temperature 98.7 F (37.1 C), temperature source Oral, resp. rate 18, height 5\' 7"  (1.702 m), weight 79.2 kg (174 lb 9.7 oz), SpO2 99 %.  GENERAL:  64 y.o.-year-old patient lying in the bed with no acute distress.  EYES: Pupils equal, round, reactive to light and accommodation. No scleral icterus. Extraocular muscles intact.  HEENT: Head atraumatic, normocephalic. Oropharynx and nasopharynx clear.  NECK:  Supple, no jugular venous distention. No thyroid enlargement, no tenderness.  LUNGS: Normal breath sounds bilaterally, no wheezing, rales,rhonchi or crepitation. No use of accessory muscles of respiration.  CARDIOVASCULAR: S1, S2 normal. No murmurs, rubs, or gallops.  ABDOMEN: Soft, nontender, nondistended. Bowel sounds present. No organomegaly or mass.  EXTREMITIES: No pedal edema, cyanosis, or clubbing.  NEUROLOGIC: Cranial nerves II through XII are intact. Muscle strength 5/5 in all extremities. Sensation intact. Gait not checked.  PSYCHIATRIC: The patient is alert and oriented x 3.  SKIN: No obvious rash, lesion, or ulcer.   Data Review     CBC w Diff: Lab Results  Component Value Date   WBC 5.1 03/07/2015   WBC 7.2 05/30/2013   HGB 11.4* 03/07/2015   HGB 12.7* 05/30/2013   HCT 35.8* 03/07/2015   HCT  37.3* 05/30/2013   PLT 144* 03/07/2015   PLT 217 05/30/2013   LYMPHOPCT 16 03/04/2015   MONOPCT 11 03/04/2015   EOSPCT 0 03/04/2015   BASOPCT 1 03/04/2015   CMP: Lab Results  Component Value Date   NA 137 03/07/2015   NA 137 05/31/2013   K 4.4 03/07/2015   K 3.8 05/31/2013   CL 95* 03/07/2015   CL 99 05/31/2013   CO2 30 03/07/2015   CO2 30 05/31/2013   BUN 54* 03/07/2015   BUN 37* 05/31/2013   CREATININE 9.99* 03/07/2015   CREATININE 11.32* 05/31/2013   PROT 6.8 03/04/2015   PROT 7.7 05/30/2013   ALBUMIN 3.0* 03/07/2015   ALBUMIN 3.4 05/30/2013   BILITOT 1.5*  03/04/2015   ALKPHOS 68 03/04/2015   ALKPHOS 104 05/30/2013   AST 21 03/04/2015   AST 34 05/30/2013   ALT 10* 03/04/2015   ALT 14 05/30/2013  .  Micro Results Recent Results (from the past 240 hour(s))  Blood culture (routine x 2)     Status: None (Preliminary result)   Collection Time: 03/04/15  5:13 PM  Result Value Ref Range Status   Specimen Description BLOOD RIGHT HAND  Final   Special Requests BOTTLES DRAWN AEROBIC AND ANAEROBIC ANA5ML,AER5ML  Final   Culture NO GROWTH 3 DAYS  Final   Report Status PENDING  Incomplete  Blood culture (routine x 2)     Status: None (Preliminary result)   Collection Time: 03/04/15  5:35 PM  Result Value Ref Range Status   Specimen Description BLOOD RIGHT ARM  Final   Special Requests BAA5MLAER,ANA5ML  Final   Culture NO GROWTH 3 DAYS  Final   Report Status PENDING  Incomplete  CSF culture with Stat gram stain     Status: None   Collection Time: 03/04/15  7:27 PM  Result Value Ref Range Status   Specimen Description CSF  Final   Special Requests Normal  Final   Gram Stain   Final    NO ORGANISMS SEEN FEW WBC SEEN MANY RBC SEEN CONFIRMED BY RWW    Culture NO GROWTH 3 DAYS  Final   Report Status 03/07/2015 FINAL  Final        Code Status Orders        Start     Ordered   03/04/15 2253  Full code   Continuous     03/04/15 2253          Follow-up Information    Follow up with pcp In 7 days.      Discharge Medications     Medication List    TAKE these medications        aspirin EC 325 MG tablet  Take 325 mg by mouth daily.     atorvastatin 10 MG tablet  Commonly known as:  LIPITOR  Take 1 tablet (10 mg total) by mouth daily.     cyclobenzaprine 10 MG tablet  Commonly known as:  FLEXERIL  Take 1 tablet (10 mg total) by mouth every 8 (eight) hours as needed for muscle spasms.     diazepam 5 MG tablet  Commonly known as:  VALIUM  Take 5 mg by mouth every 8 (eight) hours as needed for muscle spasms.      metoprolol tartrate 25 MG tablet  Commonly known as:  LOPRESSOR  Take 1 tablet (25 mg total) by mouth 2 (two) times daily.     metroNIDAZOLE 500 MG tablet  Commonly known as:  FLAGYL  Take 1 tablet (500 mg total) by mouth 3 (three) times daily.     oxyCODONE-acetaminophen 5-325 MG per tablet  Commonly known as:  ROXICET  Take 1 tablet by mouth every 8 (eight) hours as needed for moderate pain or severe pain.     predniSONE 10 MG tablet  Commonly known as:  DELTASONE  Label  & dispense according to the schedule below.  6 tablets day one, then 5 table day 2, then 4 tablets day 3, then 3 tablets day 4, 2 tablets day 5, then 1 tablet day 6, then stop     sevelamer carbonate 800 MG tablet  Commonly known as:  RENVELA  Take 3,200 mg by mouth 3 (three) times daily with meals.           Total Time in preparing paper work, data evaluation and todays exam - 35 minutes  Dustin Flock M.D on 03/07/2015 at 12:42 PM  [facility]   Office  [phone]

## 2015-03-07 NOTE — Telephone Encounter (Signed)
Sherry at [facility]  Wants to know if you would accept him back as a patient.  He has been at [facility] and will be discharged today.  She needs to find him someone to follow his health.  In for neck pain.  ESRD overload of fluid CHF.  Her call back is [phone].   Thanks, Con Memos

## 2015-03-08 LAB — HEPATITIS B SURFACE ANTIGEN: Hepatitis B Surface Ag: NEGATIVE

## 2015-03-08 NOTE — Progress Notes (Signed)
   03/06/15 1700  PT Visit Information  Last PT Received On 03/06/15  Assistance Needed +1  History of Present Illness Aspiration PNA and ESRD presenting to hosp with elevated creatinine and occluded cystic duct, non surgical patient.  New a-fib and controlled rate.  Precautions  Precautions Fall  Restrictions  Weight Bearing Restrictions No  Home Living  Family/patient expects to be discharged to: Private residence  Living Arrangements Spouse/significant other;Children  Available Help at Discharge Family  Type of [place] to enter  Entrance Stairs-Number of Steps 8 steps with R rail  Prior Function  Level of Independence Independent  Comments Pt reports that he push mows his lawn and washes his cars regularly w/o issue  Communication  Communication No difficulties  Pain Assessment  Pain Assessment (pt reporting mild R neck pain)  Cognition  Arousal/Alertness Awake/alert  Behavior During Therapy WFL for tasks assessed/performed  Overall Cognitive Status Within Functional Limits for tasks assessed  Upper Extremity Assessment  Upper Extremity Assessment Generalized weakness  Lower Extremity Assessment  Lower Extremity Assessment Overall WFL for tasks assessed  Bed Mobility  Overal bed mobility Independent  Transfers  Overall transfer level Independent  Equipment used None  Ambulation/Gait  Ambulation/Gait assistance Min assist  Ambulation Distance (Feet) 200 Feet  Assistive device None  General Gait Details Pt has multiple small losses of balance, and though he does not need direct assist to remain upright he is clearly unsteady and reports he is not quite at his baseline  Stairs Yes  Stairs assistance Min guard  Stair Management One rail Right  Number of Stairs 6  Balance  Overall balance assessment Modified Independent  PT - End of Session  Equipment Utilized During Treatment Gait belt  PT Assessment  PT Therapy Diagnosis  Difficulty  walking;Generalized weakness  PT Recommendation/Assessment Patient needs continued PT services  PT Problem List Decreased strength;Decreased balance;Decreased mobility;Decreased coordination;Decreased safety awareness  PT Plan  PT Frequency (ACUTE ONLY) Min 2X/week  PT Treatment/Interventions (ACUTE ONLY) Gait training;Therapeutic activities;Therapeutic exercise;Balance training;Neuromuscular re-education  PT Recommendation  Follow Up Recommendations Home health PT  PT equipment (encouraged him to use his SPC)  Individuals Consulted  Consulted and Agree with Results and Recommendations Family member/caregiver  Acute Rehab PT Goals  Patient Stated Goal "I want to go home"  PT Goal Formulation With patient/family  Time For Goal Achievement 03/20/15  Potential to Achieve Goals Good  PT Time Calculation  PT Start Time (ACUTE ONLY) 1648  PT Stop Time (ACUTE ONLY) 1702  PT Time Calculation (min) (ACUTE ONLY) 14 min  PT G-Codes **NOT FOR INPATIENT CLASS**  Functional Assessment Tool Used clinical judgment  Functional Limitation Mobility: Walking and moving around  Mobility: Walking and Moving Around Current Status (N4709) CI  Mobility: Walking and Moving Around Goal Status (G2836) CI  PT General Charges  $$ ACUTE PT VISIT 1 Procedure  PT Evaluation  $Initial PT Evaluation Tier I 1 Procedure   Late entry for missed G-code. Based on review of the evaluation and goals by Wayne Both PT, DPT. Lady Deutscher PT, DPT 03/08/2015 10:49 AM

## 2015-03-09 DIAGNOSIS — N2581 Secondary hyperparathyroidism of renal origin: Secondary | ICD-10-CM | POA: Diagnosis not present

## 2015-03-09 DIAGNOSIS — N186 End stage renal disease: Secondary | ICD-10-CM | POA: Diagnosis not present

## 2015-03-09 DIAGNOSIS — E1129 Type 2 diabetes mellitus with other diabetic kidney complication: Secondary | ICD-10-CM | POA: Diagnosis not present

## 2015-03-09 DIAGNOSIS — K8010 Calculus of gallbladder with chronic cholecystitis without obstruction: Secondary | ICD-10-CM | POA: Diagnosis not present

## 2015-03-09 DIAGNOSIS — D631 Anemia in chronic kidney disease: Secondary | ICD-10-CM | POA: Diagnosis not present

## 2015-03-11 DIAGNOSIS — N186 End stage renal disease: Secondary | ICD-10-CM | POA: Diagnosis not present

## 2015-03-11 DIAGNOSIS — N2581 Secondary hyperparathyroidism of renal origin: Secondary | ICD-10-CM | POA: Diagnosis not present

## 2015-03-11 DIAGNOSIS — E1129 Type 2 diabetes mellitus with other diabetic kidney complication: Secondary | ICD-10-CM | POA: Diagnosis not present

## 2015-03-11 DIAGNOSIS — D631 Anemia in chronic kidney disease: Secondary | ICD-10-CM | POA: Diagnosis not present

## 2015-03-11 LAB — CULTURE, BLOOD (ROUTINE X 2)
Culture: NO GROWTH
Culture: NO GROWTH

## 2015-03-11 LAB — HEPATITIS C ANTIBODY (REFLEX): HCV Ab: 0.1 s/co ratio (ref 0.0–0.9)

## 2015-03-12 DIAGNOSIS — N186 End stage renal disease: Secondary | ICD-10-CM | POA: Diagnosis not present

## 2015-03-12 DIAGNOSIS — I2510 Atherosclerotic heart disease of native coronary artery without angina pectoris: Secondary | ICD-10-CM | POA: Diagnosis not present

## 2015-03-12 DIAGNOSIS — N289 Disorder of kidney and ureter, unspecified: Secondary | ICD-10-CM | POA: Diagnosis not present

## 2015-03-12 DIAGNOSIS — M542 Cervicalgia: Secondary | ICD-10-CM | POA: Diagnosis not present

## 2015-03-12 DIAGNOSIS — Z992 Dependence on renal dialysis: Secondary | ICD-10-CM | POA: Diagnosis not present

## 2015-03-12 DIAGNOSIS — I5022 Chronic systolic (congestive) heart failure: Secondary | ICD-10-CM | POA: Diagnosis not present

## 2015-03-12 DIAGNOSIS — E119 Type 2 diabetes mellitus without complications: Secondary | ICD-10-CM | POA: Diagnosis not present

## 2015-03-12 DIAGNOSIS — Z87891 Personal history of nicotine dependence: Secondary | ICD-10-CM | POA: Diagnosis not present

## 2015-03-12 DIAGNOSIS — Z8673 Personal history of transient ischemic attack (TIA), and cerebral infarction without residual deficits: Secondary | ICD-10-CM | POA: Diagnosis not present

## 2015-03-14 DIAGNOSIS — N2581 Secondary hyperparathyroidism of renal origin: Secondary | ICD-10-CM | POA: Diagnosis not present

## 2015-03-14 DIAGNOSIS — E1129 Type 2 diabetes mellitus with other diabetic kidney complication: Secondary | ICD-10-CM | POA: Diagnosis not present

## 2015-03-14 DIAGNOSIS — N186 End stage renal disease: Secondary | ICD-10-CM | POA: Diagnosis not present

## 2015-03-14 DIAGNOSIS — D631 Anemia in chronic kidney disease: Secondary | ICD-10-CM | POA: Diagnosis not present

## 2015-03-15 ENCOUNTER — Ambulatory Visit (INDEPENDENT_AMBULATORY_CARE_PROVIDER_SITE_OTHER): Payer: Self-pay | Admitting: Family Medicine

## 2015-03-15 ENCOUNTER — Telehealth: Payer: Self-pay

## 2015-03-15 ENCOUNTER — Encounter: Payer: Self-pay | Admitting: Family Medicine

## 2015-03-15 VITALS — BP 140/84 | HR 75 | Temp 97.5°F | Resp 16 | Ht 68.0 in | Wt 185.6 lb

## 2015-03-15 DIAGNOSIS — R5383 Other fatigue: Secondary | ICD-10-CM

## 2015-03-15 DIAGNOSIS — R531 Weakness: Secondary | ICD-10-CM

## 2015-03-15 DIAGNOSIS — N186 End stage renal disease: Secondary | ICD-10-CM | POA: Insufficient documentation

## 2015-03-15 DIAGNOSIS — E1122 Type 2 diabetes mellitus with diabetic chronic kidney disease: Secondary | ICD-10-CM

## 2015-03-15 DIAGNOSIS — Z992 Dependence on renal dialysis: Secondary | ICD-10-CM

## 2015-03-15 DIAGNOSIS — Z7189 Other specified counseling: Secondary | ICD-10-CM

## 2015-03-15 DIAGNOSIS — R296 Repeated falls: Secondary | ICD-10-CM

## 2015-03-15 DIAGNOSIS — H53453 Other localized visual field defect, bilateral: Secondary | ICD-10-CM | POA: Diagnosis not present

## 2015-03-15 DIAGNOSIS — Z7689 Persons encountering health services in other specified circumstances: Secondary | ICD-10-CM

## 2015-03-15 DIAGNOSIS — I10 Essential (primary) hypertension: Secondary | ICD-10-CM

## 2015-03-15 DIAGNOSIS — I120 Hypertensive chronic kidney disease with stage 5 chronic kidney disease or end stage renal disease: Secondary | ICD-10-CM | POA: Insufficient documentation

## 2015-03-15 DIAGNOSIS — N189 Chronic kidney disease, unspecified: Secondary | ICD-10-CM

## 2015-03-15 DIAGNOSIS — I5022 Chronic systolic (congestive) heart failure: Secondary | ICD-10-CM

## 2015-03-15 MED ORDER — CANE MISC: 1.0000 | Freq: Every day | Status: DC

## 2015-03-15 NOTE — Telephone Encounter (Signed)
Called pt to review discharge instruction and confirm appt. No voice mail on home phone. Will call back

## 2015-03-15 NOTE — Telephone Encounter (Signed)
Patient contacted regarding discharge from [facility] on 03/15/15.  Patient understands to follow up with Christell Faith, PA on 03/30/15 at 2:00 at [facility]. Patient understands discharge instructions? yes Patient understands medications and regiment? yes Patient understands to bring all medications to this visit? yes  Pt's wife asks that if we have any cancellations, that pt's appt be moved to a Tues or Thurs, as pt is worn out on dialysis days.

## 2015-03-15 NOTE — Patient Instructions (Signed)
Plan: Weakness- continue with PT- I want you seen by neurology to help figure out your weakness and headaches.   Diabetes: Controlled. Continue to keep a record of your sugars from dialysis- if rising > 170 consistently please let me know.   CHF: We will set you up with a cardiologist to make sure we are taking care of your heart.

## 2015-03-15 NOTE — Progress Notes (Signed)
Subjective:    Patient ID: Luis Walter, male    DOB: [birth year], 64 y.o.   MRN: [id number]  HPI: Luis Walter is a 64 y.o. male presenting on 03/15/2015 for Establish Care   HPI  Pt presents to establish care today. Previous PCP was Dr. Rosanna Randy at [facility]- he was discharged from the practice. Currently sees nephrology at [facility] (Dr. Holley Raring?) gets HD MWF- blood work is done with dialysis.  Pt was recently hospitalized and found to have CHF with EF of 35-40%. He was not set up with cardiology.  Current medical problems: ESRD: Started HD about 1 year ago. Pt has history of HTN and DM that contributed to kidney failure. Managed by [facility]. CHF: Diagnosed May of 2016- does not have a cardiologist.  Stroke: Multiple strokes in the past- last 2-3 years ago.  General weakness: Set up with PT at the hospital- should have had a visit today. Was evaluated on Saturday by PT who recommend a cane for ambulation. Has falls at home. No side weaker than the other.  Headaches: Started 3 weeks ago. Come and go. Sharp pain across forehead-lasts 2-3 minutes. No photophobia, phonophobia at the time. No nausea. Pt treats by laying and resting. HA occur about once day per patient and wife reports it is happening daily. Multiple CTs and MRIs were negative. He was given oxycodone and flexeril at hospital- wife stopped it due to lethargy.  Lethargy- Wife reports he has been lethargic for about 3 weeks. Multiple lab tests at hospital were inconclusive. Sleepy all the time. Wakes up to voice.  Diabetes: Treated with oral medications in the past. Checking sugars in the hospital- given SSI. Did not get discharged on insulin. Last A1c was 2 years ago. Pt does not check blood sugar at home because it was done at dialysis. Sugars avg 120-150.    Past Medical History  Diagnosis Date  . ESRD (end stage renal disease)     a. on HD M,W,F; b. previously on transplant list at [facility] - removed 2016  .  DM2 (diabetes mellitus, type 2)   . CAD (coronary artery disease)     a. cardiac cath 2014: LM nl, mLAD 30%, dLAD 30%, ostLCx 50%, mid ramus 20% OM3 80% pRCA 20%, mRCA 20%, PDA 100% w/ left to right collats, medically managed   . Stroke   . Chronic systolic CHF (congestive heart failure)     a. echo 12/2014: EF 35-40%, mod diffuse HK, RWMA cannot be excluded, LV diastolic fxn parameters nl, mild MR, moderately dilated LA, mildly dilated RV internal cavity size, mild TR, PASP nl  . Gallstones   . Dialysis patient    History   Social History  . Marital Status: Married    Spouse Name: N/A  . Number of Children: N/A  . Years of Education: N/A   Occupational History  . Not on file.   Social History Main Topics  . Smoking status: Former Research scientist (life sciences)  . Smokeless tobacco: Not on file  . Alcohol Use: No  . Drug Use: No  . Sexual Activity: Not on file   Other Topics Concern  . Not on file   Social History Narrative   Family History  Problem Relation Age of Onset  . Hypertension    . Diabetes    . Diabetes Mother   . Diabetes Father    Current Outpatient Prescriptions on File Prior to Visit  Medication Sig  . aspirin EC 325  MG tablet Take 325 mg by mouth daily.  Marland Kitchen atorvastatin (LIPITOR) 10 MG tablet Take 1 tablet (10 mg total) by mouth daily.  . metoprolol tartrate (LOPRESSOR) 25 MG tablet Take 1 tablet (25 mg total) by mouth 2 (two) times daily.  . sevelamer carbonate (RENVELA) 800 MG tablet Take 3,200 mg by mouth 3 (three) times daily with meals.   No current facility-administered medications on file prior to visit.    Review of Systems  Constitutional: Positive for activity change and fatigue. Negative for fever and chills.  HENT: Negative for facial swelling, sinus pressure, trouble swallowing and voice change.   Eyes: Negative for photophobia and visual disturbance.  Respiratory: Negative for cough and shortness of breath.   Cardiovascular: Negative for chest pain,  palpitations and leg swelling.  Gastrointestinal: Negative.   Endocrine: Negative for polydipsia, polyphagia and polyuria.  Genitourinary: Negative.   Musculoskeletal: Positive for neck pain.  Skin: Negative.   Neurological: Positive for weakness and headaches. Negative for dizziness, syncope, facial asymmetry, speech difficulty, light-headedness and numbness.  Psychiatric/Behavioral: Positive for decreased concentration. Negative for behavioral problems, confusion and agitation.   Per HPI unless specifically indicated above     Objective:    BP 140/84 mmHg  Pulse 75  Temp(Src) 97.5 F (36.4 C) (Oral)  Resp 16  Ht 5\' 8"  (1.727 m)  Wt 185 lb 9.6 oz (84.188 kg)  BMI 28.23 kg/m2  Wt Readings from Last 3 Encounters:  03/15/15 185 lb 9.6 oz (84.188 kg)  03/07/15 173 lb 1 oz (78.5 kg)  03/05/15 173 lb (78.472 kg)    Physical Exam  Constitutional: He is oriented to person, place, and time. He appears lethargic. No distress.  HENT:  Head: Normocephalic and atraumatic.  Eyes: EOM are normal. Pupils are equal, round, and reactive to light.  Neck: Normal range of motion. No thyromegaly present.  Cardiovascular: Normal rate and regular rhythm.  Exam reveals no gallop and no friction rub.   No murmur heard. + thrill and bruit LUE AV fistula.   Pulmonary/Chest: Effort normal and breath sounds normal. No respiratory distress. He has no rales.  Abdominal: Soft. Bowel sounds are normal.  Musculoskeletal: He exhibits tenderness (upper cervical area. ). He exhibits no edema.  Lymphadenopathy:    He has no cervical adenopathy.  Neurological: He is oriented to person, place, and time. He appears lethargic. He displays no tremor. No cranial nerve deficit. He displays a negative Romberg sign. He displays no seizure activity. Gait (gait unsteady, unable to do heel toe gait due to balance. ) abnormal. GCS eye subscore is 3. GCS verbal subscore is 5. GCS motor subscore is 6.  Reflex Scores:       Brachioradialis reflexes are 1+ on the right side and 1+ on the left side.      Patellar reflexes are 1+ on the right side and 1+ on the left side. Sleepy during exam.   Skin: He is not diaphoretic.   Results for orders placed or performed during the hospital encounter of 03/04/15  Blood culture (routine x 2)  Result Value Ref Range   Specimen Description BLOOD RIGHT HAND    Special Requests BOTTLES DRAWN AEROBIC AND ANAEROBIC ANA5ML,[id number]    Culture NO GROWTH 7 DAYS    Report Status 03/11/2015 FINAL   Blood culture (routine x 2)  Result Value Ref Range   Specimen Description BLOOD RIGHT ARM    Special Requests [id number]    Culture NO GROWTH 7 DAYS  Report Status 03/11/2015 FINAL   CSF culture with Stat gram stain  Result Value Ref Range   Specimen Description CSF    Special Requests Normal    Gram Stain      NO ORGANISMS SEEN FEW WBC SEEN MANY RBC SEEN CONFIRMED BY RWW    Culture NO GROWTH 3 DAYS    Report Status 03/07/2015 FINAL   CBC with Differential  Result Value Ref Range   WBC 6.3 3.8 - 10.6 K/uL   RBC 4.09 (L) 4.40 - 5.90 MIL/uL   Hemoglobin 11.3 (L) 13.0 - 18.0 g/dL   HCT 35.6 (L) 40.0 - 52.0 %   MCV 87.1 80.0 - 100.0 fL   MCH 27.6 26.0 - 34.0 pg   MCHC 31.7 (L) 32.0 - 36.0 g/dL   RDW 17.9 (H) 11.5 - 14.5 %   Platelets 132 (L) 150 - 440 K/uL   Neutrophils Relative % 72 %   Neutro Abs 4.5 1.4 - 6.5 K/uL   Lymphocytes Relative 16 %   Lymphs Abs 1.0 1.0 - 3.6 K/uL   Monocytes Relative 11 %   Monocytes Absolute 0.7 0.2 - 1.0 K/uL   Eosinophils Relative 0 %   Eosinophils Absolute 0.0 0 - 0.7 K/uL   Basophils Relative 1 %   Basophils Absolute 0.1 0 - 0.1 K/uL  Comprehensive metabolic panel  Result Value Ref Range   Sodium 138 135 - 145 mmol/L   Potassium 3.6 3.5 - 5.1 mmol/L   Chloride 93 (L) 101 - 111 mmol/L   CO2 35 (H) 22 - 32 mmol/L   Glucose, Bld 109 (H) 65 - 99 mg/dL   BUN 16 6 - 20 mg/dL   Creatinine, Ser 5.61 (H) 0.61 - 1.24 mg/dL    Calcium 8.8 (L) 8.9 - 10.3 mg/dL   Total Protein 6.8 6.5 - 8.1 g/dL   Albumin 3.0 (L) 3.5 - 5.0 g/dL   AST 21 15 - 41 U/L   ALT 10 (L) 17 - 63 U/L   Alkaline Phosphatase 68 38 - 126 U/L   Total Bilirubin 1.5 (H) 0.3 - 1.2 mg/dL   GFR calc non Af Amer 10 (L) >60 mL/min   GFR calc Af Amer 11 (L) >60 mL/min   Anion gap 10 5 - 15  Troponin I  Result Value Ref Range   Troponin I 0.28 (H) <0.031 ng/mL  Lactic acid, plasma  Result Value Ref Range   Lactic Acid, Venous 1.8 0.5 - 2.0 mmol/L  Lactic acid, plasma  Result Value Ref Range   Lactic Acid, Venous 1.4 0.5 - 2.0 mmol/L  CSF cell count with differential collection tube #: 1  Result Value Ref Range   Tube # 1    Color, CSF COLORLESS COLORLESS   Appearance, CSF CLEAR (A) CLEAR   RBC Count, CSF 431 (H) 0 - 3 /cu mm   WBC, CSF 0 /cu mm   Segmented Neutrophils-CSF 0 %   Lymphs, CSF 0 %   Monocyte-Macrophage-Spinal Fluid 0 %   Eosinophils, CSF 0 %   Other Cells, CSF 0   Glucose, CSF  Result Value Ref Range   Glucose, CSF 71 (H) 40 - 70 mg/dL  Protein, CSF  Result Value Ref Range   Total  Protein, CSF 79 (H) 15 - 45 mg/dL  Glucose, capillary  Result Value Ref Range   Glucose-Capillary 111 (H) 65 - 99 mg/dL  Troponin I (q 6hr x 3)  Result Value Ref Range  Troponin I 0.29 (H) <0.031 ng/mL  Troponin I (q 6hr x 3)  Result Value Ref Range   Troponin I 0.26 (H) <0.031 ng/mL  Troponin I (q 6hr x 3)  Result Value Ref Range   Troponin I 0.24 (H) <0.031 ng/mL  Glucose, capillary  Result Value Ref Range   Glucose-Capillary 119 (H) 65 - 99 mg/dL  Glucose, capillary  Result Value Ref Range   Glucose-Capillary 255 (H) 65 - 99 mg/dL  Hepatitis B surface antigen  Result Value Ref Range   Hepatitis B Surface Ag Negative Negative  Renal function panel  Result Value Ref Range   Sodium 137 135 - 145 mmol/L   Potassium 4.4 3.5 - 5.1 mmol/L   Chloride 95 (L) 101 - 111 mmol/L   CO2 30 22 - 32 mmol/L   Glucose, Bld 334 (H) 65 - 99  mg/dL   BUN 54 (H) 6 - 20 mg/dL   Creatinine, Ser 9.99 (H) 0.61 - 1.24 mg/dL   Calcium 8.8 (L) 8.9 - 10.3 mg/dL   Phosphorus 6.0 (H) 2.5 - 4.6 mg/dL   Albumin 3.0 (L) 3.5 - 5.0 g/dL   GFR calc non Af Amer 5 (L) >60 mL/min   GFR calc Af Amer 6 (L) >60 mL/min   Anion gap 12 5 - 15  CBC  Result Value Ref Range   WBC 5.1 3.8 - 10.6 K/uL   RBC 4.15 (L) 4.40 - 5.90 MIL/uL   Hemoglobin 11.4 (L) 13.0 - 18.0 g/dL   HCT 35.8 (L) 40.0 - 52.0 %   MCV 86.1 80.0 - 100.0 fL   MCH 27.5 26.0 - 34.0 pg   MCHC 31.9 (L) 32.0 - 36.0 g/dL   RDW 17.2 (H) 11.5 - 14.5 %   Platelets 144 (L) 150 - 440 K/uL      Assessment & Plan:   Problem List Items Addressed This Visit      Cardiovascular and Mediastinum   Chronic systolic CHF (congestive heart failure)    Refer to cardiology to establish care if needed for follow-up. Fluid control via dialysis.       Relevant Orders   Ambulatory referral to Cardiology   BP (high blood pressure)    Controlled in the office. Continue current regimen- follow recommendations of nephrology.         Endocrine   Diabetes mellitus, type 2 - Primary    A1c 6.1%- controlled. Continue to have BG checked 3 times weekly. Pt instructed to call if consistently > 150.      Relevant Orders   POCT HgB A1C     Genitourinary   ESRD (end stage renal disease)    Continue HD and follow recommendations of nephrology.         Other   Generalized weakness    Given general weakness, HA, and lethargy, history of CVA- would like a neurology consult.  Lab studies and CT/MRI from hospital inconclusive. Continue with home health PT for conditioning.        Relevant Medications   Misc. Devices (CANE) MISC   Other Relevant Orders   Ambulatory referral to Neurology    Other Visit Diagnoses    Multiple falls        Continue home health PT. Falls precautions reviewed. Prescription for cane given today, pt encouraged to use on a daily basis.     Lethargy        Pt arousable  to voice. Per family this is a big change  since prior to his recent hospitalization. Alarm symptoms reviewed with wife. Possible due to dialysis? Neurology consult placed.        Meds ordered this encounter  Medications  . Misc. Devices (CANE) MISC    Sig: 1 each by Does not apply route daily. Please get 3 prong or wide based cane for ambulation.    Dispense:  1 each    Refill:  0    Order Specific Question:  Supervising Provider    Answer:  Arlis Porta [[id number]]      Follow up plan: Return in about 3 weeks (around 04/05/2015) for weakness.Marland Kitchen

## 2015-03-15 NOTE — Telephone Encounter (Signed)
-----   Message from Blain Pais sent at 03/15/2015  2:44 PM EDT ----- Regarding: tcm/ph 8/10/ 2:00 Christell Faith PA

## 2015-03-15 NOTE — Telephone Encounter (Deleted)
-----   Message from Blain Pais sent at 03/15/2015  2:44 PM EDT ----- Regarding: tcm/ph 8/10/ 2:00 Christell Faith PA

## 2015-03-15 NOTE — Telephone Encounter (Signed)
Pt has appointment on 03/28/2015 at 7:30 am with [facility] neurology dr. Melrose Nakayama and also has appointment on 03/30/2015 at 2:00 pm with [facility].

## 2015-03-15 NOTE — Telephone Encounter (Signed)
Pt told about appts and given number ot offices in case the need to reschedule.

## 2015-03-15 NOTE — Assessment & Plan Note (Signed)
Refer to cardiology to establish care if needed for follow-up. Fluid control via dialysis.

## 2015-03-15 NOTE — Assessment & Plan Note (Signed)
Given general weakness, HA, and lethargy, history of CVA- would like a neurology consult.  Lab studies and CT/MRI from hospital inconclusive. Continue with home health PT for conditioning.

## 2015-03-15 NOTE — Assessment & Plan Note (Signed)
A1c 6.1%- controlled. Continue to have BG checked 3 times weekly. Pt instructed to call if consistently > 150.

## 2015-03-15 NOTE — Assessment & Plan Note (Signed)
Continue HD and follow recommendations of nephrology.

## 2015-03-15 NOTE — Assessment & Plan Note (Signed)
Controlled in the office. Continue current regimen- follow recommendations of nephrology.

## 2015-03-16 DIAGNOSIS — N186 End stage renal disease: Secondary | ICD-10-CM | POA: Diagnosis not present

## 2015-03-16 DIAGNOSIS — E1129 Type 2 diabetes mellitus with other diabetic kidney complication: Secondary | ICD-10-CM | POA: Diagnosis not present

## 2015-03-16 DIAGNOSIS — D631 Anemia in chronic kidney disease: Secondary | ICD-10-CM | POA: Diagnosis not present

## 2015-03-16 DIAGNOSIS — N2581 Secondary hyperparathyroidism of renal origin: Secondary | ICD-10-CM | POA: Diagnosis not present

## 2015-03-17 DIAGNOSIS — I10 Essential (primary) hypertension: Secondary | ICD-10-CM | POA: Diagnosis not present

## 2015-03-17 DIAGNOSIS — R51 Headache: Secondary | ICD-10-CM | POA: Diagnosis not present

## 2015-03-17 DIAGNOSIS — R519 Headache, unspecified: Secondary | ICD-10-CM | POA: Insufficient documentation

## 2015-03-17 LAB — POCT GLYCOSYLATED HEMOGLOBIN (HGB A1C): HEMOGLOBIN A1C: 6.1

## 2015-03-18 DIAGNOSIS — N2581 Secondary hyperparathyroidism of renal origin: Secondary | ICD-10-CM | POA: Diagnosis not present

## 2015-03-18 DIAGNOSIS — D631 Anemia in chronic kidney disease: Secondary | ICD-10-CM | POA: Diagnosis not present

## 2015-03-18 DIAGNOSIS — E1129 Type 2 diabetes mellitus with other diabetic kidney complication: Secondary | ICD-10-CM | POA: Diagnosis not present

## 2015-03-18 DIAGNOSIS — N186 End stage renal disease: Secondary | ICD-10-CM | POA: Diagnosis not present

## 2015-03-20 DIAGNOSIS — N186 End stage renal disease: Secondary | ICD-10-CM | POA: Diagnosis not present

## 2015-03-20 DIAGNOSIS — Z992 Dependence on renal dialysis: Secondary | ICD-10-CM | POA: Diagnosis not present

## 2015-03-20 DIAGNOSIS — E1122 Type 2 diabetes mellitus with diabetic chronic kidney disease: Secondary | ICD-10-CM | POA: Diagnosis not present

## 2015-03-21 DIAGNOSIS — T82398A Other mechanical complication of other vascular grafts, initial encounter: Secondary | ICD-10-CM | POA: Diagnosis not present

## 2015-03-21 DIAGNOSIS — S51832A Puncture wound without foreign body of left forearm, initial encounter: Secondary | ICD-10-CM | POA: Diagnosis not present

## 2015-03-21 DIAGNOSIS — Z111 Encounter for screening for respiratory tuberculosis: Secondary | ICD-10-CM | POA: Diagnosis not present

## 2015-03-21 DIAGNOSIS — N2581 Secondary hyperparathyroidism of renal origin: Secondary | ICD-10-CM | POA: Diagnosis not present

## 2015-03-21 DIAGNOSIS — N186 End stage renal disease: Secondary | ICD-10-CM | POA: Diagnosis not present

## 2015-03-21 DIAGNOSIS — D631 Anemia in chronic kidney disease: Secondary | ICD-10-CM | POA: Diagnosis not present

## 2015-03-21 DIAGNOSIS — E1129 Type 2 diabetes mellitus with other diabetic kidney complication: Secondary | ICD-10-CM | POA: Diagnosis not present

## 2015-03-22 DIAGNOSIS — I5022 Chronic systolic (congestive) heart failure: Secondary | ICD-10-CM | POA: Diagnosis not present

## 2015-03-22 DIAGNOSIS — I2510 Atherosclerotic heart disease of native coronary artery without angina pectoris: Secondary | ICD-10-CM | POA: Diagnosis not present

## 2015-03-22 DIAGNOSIS — N186 End stage renal disease: Secondary | ICD-10-CM | POA: Diagnosis not present

## 2015-03-22 DIAGNOSIS — N289 Disorder of kidney and ureter, unspecified: Secondary | ICD-10-CM | POA: Diagnosis not present

## 2015-03-22 DIAGNOSIS — E119 Type 2 diabetes mellitus without complications: Secondary | ICD-10-CM | POA: Diagnosis not present

## 2015-03-22 DIAGNOSIS — M542 Cervicalgia: Secondary | ICD-10-CM | POA: Diagnosis not present

## 2015-03-23 DIAGNOSIS — S51832A Puncture wound without foreign body of left forearm, initial encounter: Secondary | ICD-10-CM | POA: Diagnosis not present

## 2015-03-23 DIAGNOSIS — E1129 Type 2 diabetes mellitus with other diabetic kidney complication: Secondary | ICD-10-CM | POA: Diagnosis not present

## 2015-03-23 DIAGNOSIS — N186 End stage renal disease: Secondary | ICD-10-CM | POA: Diagnosis not present

## 2015-03-23 DIAGNOSIS — N2581 Secondary hyperparathyroidism of renal origin: Secondary | ICD-10-CM | POA: Diagnosis not present

## 2015-03-23 DIAGNOSIS — Z111 Encounter for screening for respiratory tuberculosis: Secondary | ICD-10-CM | POA: Diagnosis not present

## 2015-03-23 DIAGNOSIS — T82398A Other mechanical complication of other vascular grafts, initial encounter: Secondary | ICD-10-CM | POA: Diagnosis not present

## 2015-03-24 DIAGNOSIS — M542 Cervicalgia: Secondary | ICD-10-CM | POA: Diagnosis not present

## 2015-03-24 DIAGNOSIS — E119 Type 2 diabetes mellitus without complications: Secondary | ICD-10-CM | POA: Diagnosis not present

## 2015-03-24 DIAGNOSIS — N186 End stage renal disease: Secondary | ICD-10-CM | POA: Diagnosis not present

## 2015-03-24 DIAGNOSIS — I5022 Chronic systolic (congestive) heart failure: Secondary | ICD-10-CM | POA: Diagnosis not present

## 2015-03-24 DIAGNOSIS — I2510 Atherosclerotic heart disease of native coronary artery without angina pectoris: Secondary | ICD-10-CM | POA: Diagnosis not present

## 2015-03-24 DIAGNOSIS — N289 Disorder of kidney and ureter, unspecified: Secondary | ICD-10-CM | POA: Diagnosis not present

## 2015-03-25 DIAGNOSIS — Z111 Encounter for screening for respiratory tuberculosis: Secondary | ICD-10-CM | POA: Diagnosis not present

## 2015-03-25 DIAGNOSIS — N2581 Secondary hyperparathyroidism of renal origin: Secondary | ICD-10-CM | POA: Diagnosis not present

## 2015-03-25 DIAGNOSIS — T82398A Other mechanical complication of other vascular grafts, initial encounter: Secondary | ICD-10-CM | POA: Diagnosis not present

## 2015-03-25 DIAGNOSIS — S51832A Puncture wound without foreign body of left forearm, initial encounter: Secondary | ICD-10-CM | POA: Diagnosis not present

## 2015-03-25 DIAGNOSIS — N186 End stage renal disease: Secondary | ICD-10-CM | POA: Diagnosis not present

## 2015-03-25 DIAGNOSIS — E1129 Type 2 diabetes mellitus with other diabetic kidney complication: Secondary | ICD-10-CM | POA: Diagnosis not present

## 2015-03-28 DIAGNOSIS — N2581 Secondary hyperparathyroidism of renal origin: Secondary | ICD-10-CM | POA: Diagnosis not present

## 2015-03-28 DIAGNOSIS — T82398A Other mechanical complication of other vascular grafts, initial encounter: Secondary | ICD-10-CM | POA: Diagnosis not present

## 2015-03-28 DIAGNOSIS — E1129 Type 2 diabetes mellitus with other diabetic kidney complication: Secondary | ICD-10-CM | POA: Diagnosis not present

## 2015-03-28 DIAGNOSIS — Z111 Encounter for screening for respiratory tuberculosis: Secondary | ICD-10-CM | POA: Diagnosis not present

## 2015-03-28 DIAGNOSIS — N186 End stage renal disease: Secondary | ICD-10-CM | POA: Diagnosis not present

## 2015-03-28 DIAGNOSIS — S51832A Puncture wound without foreign body of left forearm, initial encounter: Secondary | ICD-10-CM | POA: Diagnosis not present

## 2015-03-29 DIAGNOSIS — N186 End stage renal disease: Secondary | ICD-10-CM | POA: Diagnosis not present

## 2015-03-29 DIAGNOSIS — I5022 Chronic systolic (congestive) heart failure: Secondary | ICD-10-CM | POA: Diagnosis not present

## 2015-03-29 DIAGNOSIS — N289 Disorder of kidney and ureter, unspecified: Secondary | ICD-10-CM | POA: Diagnosis not present

## 2015-03-29 DIAGNOSIS — M542 Cervicalgia: Secondary | ICD-10-CM | POA: Diagnosis not present

## 2015-03-29 DIAGNOSIS — E119 Type 2 diabetes mellitus without complications: Secondary | ICD-10-CM | POA: Diagnosis not present

## 2015-03-29 DIAGNOSIS — I2510 Atherosclerotic heart disease of native coronary artery without angina pectoris: Secondary | ICD-10-CM | POA: Diagnosis not present

## 2015-03-30 ENCOUNTER — Ambulatory Visit (INDEPENDENT_AMBULATORY_CARE_PROVIDER_SITE_OTHER): Payer: Medicare Other | Admitting: Physician Assistant

## 2015-03-30 ENCOUNTER — Encounter: Payer: Self-pay | Admitting: Physician Assistant

## 2015-03-30 VITALS — BP 120/82 | HR 95 | Ht 67.0 in | Wt 180.2 lb

## 2015-03-30 DIAGNOSIS — I2510 Atherosclerotic heart disease of native coronary artery without angina pectoris: Secondary | ICD-10-CM | POA: Diagnosis not present

## 2015-03-30 DIAGNOSIS — E1129 Type 2 diabetes mellitus with other diabetic kidney complication: Secondary | ICD-10-CM | POA: Diagnosis not present

## 2015-03-30 DIAGNOSIS — R531 Weakness: Secondary | ICD-10-CM

## 2015-03-30 DIAGNOSIS — Z111 Encounter for screening for respiratory tuberculosis: Secondary | ICD-10-CM | POA: Diagnosis not present

## 2015-03-30 DIAGNOSIS — N2581 Secondary hyperparathyroidism of renal origin: Secondary | ICD-10-CM | POA: Diagnosis not present

## 2015-03-30 DIAGNOSIS — I5022 Chronic systolic (congestive) heart failure: Secondary | ICD-10-CM | POA: Diagnosis not present

## 2015-03-30 DIAGNOSIS — S51832A Puncture wound without foreign body of left forearm, initial encounter: Secondary | ICD-10-CM | POA: Diagnosis not present

## 2015-03-30 DIAGNOSIS — N186 End stage renal disease: Secondary | ICD-10-CM | POA: Diagnosis not present

## 2015-03-30 DIAGNOSIS — M542 Cervicalgia: Secondary | ICD-10-CM | POA: Insufficient documentation

## 2015-03-30 DIAGNOSIS — R002 Palpitations: Secondary | ICD-10-CM

## 2015-03-30 DIAGNOSIS — T82398A Other mechanical complication of other vascular grafts, initial encounter: Secondary | ICD-10-CM | POA: Diagnosis not present

## 2015-03-30 MED ORDER — ISOSORBIDE MONONITRATE ER 30 MG PO TB24: 15.0000 mg | ORAL_TABLET | Freq: Every day | ORAL | Status: DC

## 2015-03-30 MED ORDER — ATORVASTATIN CALCIUM 10 MG PO TABS: 10.0000 mg | ORAL_TABLET | Freq: Every day | ORAL | Status: DC

## 2015-03-30 MED ORDER — HYDRALAZINE HCL 10 MG PO TABS: 10.0000 mg | ORAL_TABLET | Freq: Three times a day (TID) | ORAL | Status: DC

## 2015-03-30 NOTE — Progress Notes (Signed)
Cardiology Hospital Follow Up Note:   Date of Encounter: 03/30/2015  ID: Luis Walter, DOB 1951-07-27, MRN [id number]  PCP: [name], NP Primary Cardiologist: [name], MD  Chief Complaint  Patient presents with  . other    Follow up from [name].  Meds reviewed by the wife verbally.      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 Cardiology was consulted during admission for mildly elevated tropoin in the setting of his ESRD.   He underwent a stress echo in 3/210 and 08/2010 with wall motion normal at rest and hyperkinetic at stress though inadequate heart rate was achieved during both of those studies. In 08/2010 he subsequently underwent a nuclear stress test which showed normal LV function, inferior wall hypokinesis, and inferior wall scar without ischemia. In March 2014, he underwent a stress echo which showed wall motion abnormalities at rest, worse with stress, though target heart rate was not achieved. Furthermore, his blood pressure dropped with exercise and returned to baseline in recovery. Post-recovery, his LV function returned to baseline. In 10/2012 he underwent repeat stress echo that showed abnormal wall motion at rest with EF of 45%, worse with stress (EF 35%), BP drop with stress, inadequate heart rate achieved. He underwent cardiac catheterization 12/2012 which showed left main normal, mid LAD 30% stenosis, distal LAD 30% stenosis, ostial LCx 50% stenosis, mid ramus 20% stenosis, 80% stenosis involving trifurcating OM3 with diffuse involvement of all 3 branches, prox RCA 20% stenosis, mid RCA 20% stenosis, PDA 100% stenosis with left to right  collaterals. Medical management was recommended. As part of his transplant evaluation he underwent repeat nuclear stress test in 01/2014 that showed severe HK of the inferior wall, and mild HK of the lateral wall, EF 49%.   Recent hospitalization in May 2016 for possible Afib while in dialysis in the setting of sepsis, GNR bacteremia, and new onset anemia. Telemetry showed frequent ectopy with sinus tachycardia, frequent PVCs, PACs, and blocked PACs. No true arrhythmia. GI planning outpatient work up. Repeat hospitalization in June for diarrhea.   Presented to [name] 7/15 with headache, vision changes, and neck pain. He underwent LP which was not consistent with infection. MRI neck showed paraspinal soft tissue edema, without a drainable fluid collection. MRI with possible ligamentous injury. He denied any anginal symptoms. Troponin was checked in the setting of his ESRD on HD which was found to be elevated at 0.28-->0.29-->0.26-->0.24. Cardiology was consulted for further input. EKG was nonacute. He was noted to have increased PVCs, thus his Lopressor was increased to 50 mg bid. He was mildly volume overloaded, which was managed by HD.   He feels well today. He notes continued fatigue associated with his dialysis sessions. At times he is required to stay 2/2 hypotension. No angina, SOB, nausea, vomiting, diaphoresis, presyncope, or syncope. His neck pain has resolved. 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 He is uncertain what is dry weight should be. No orthopnea, LEE, cough, early satiety or PND.     Past Medical History  Diagnosis Date  . ESRD (end stage renal disease)     a. on HD M,W,F; b. previously on transplant  list at [name] - removed 2016  . DM2 (diabetes mellitus, type 2)   . CAD (coronary artery disease)     a. cardiac cath 2014: LM nl, mLAD 30%, dLAD 30%, ostLCx 50%, mid ramus 20% OM3 80% pRCA 20%, mRCA 20%, PDA 100% w/ left to right collats, medically managed   . Stroke   .  Chronic systolic CHF (congestive heart failure)     a. echo 12/2014: EF 35-40%, mod diffuse HK, RWMA cannot be excluded, LV diastolic fxn parameters nl, mild MR, moderately dilated LA, mildly dilated RV internal cavity size, mild TR, PASP nl  . Gallstones   . Dialysis patient   : Past Surgical History  Procedure Laterality Date  . Av fistula placement Left   : Family History  Problem Relation Age of Onset  . Hypertension    . Diabetes    . Diabetes Mother   . Diabetes Father   :  reports that he has quit smoking. He does not have any smokeless tobacco history on file. He reports that he does not drink alcohol or use illicit drugs.:   Allergies:  No Known Allergies   Home Medications:  Current Outpatient Prescriptions  Medication Sig Dispense Refill  . aspirin EC 325 MG tablet Take 325 mg by mouth daily.    Marland Kitchen atorvastatin (LIPITOR) 10 MG tablet Take 1 tablet (10 mg total) by mouth daily. 90 tablet 3  . metoprolol tartrate (LOPRESSOR) 25 MG tablet Take 1 tablet (25 mg total) by mouth 2 (two) times daily. 60 tablet 0  . Misc. Devices (CANE) MISC 1 each by Does not apply route daily. Please get 3 prong or wide based cane for ambulation. 1 each 0  . nortriptyline (PAMELOR) 10 MG capsule Take 1 capsule at night for one week, then increase to 2 capsules at night and continue this dose.    . sevelamer carbonate (RENVELA) 800 MG tablet Take 3,200 mg by mouth 3 (three) times daily with meals.    . hydrALAZINE (APRESOLINE) 10 MG tablet Take 1 tablet (10 mg total) by mouth 3 (three) times daily. 90 tablet 6  . isosorbide mononitrate (IMDUR) 30 MG 24 hr tablet Take 0.5 tablets (15 mg total) by mouth daily. 30 tablet 3   No current facility-administered medications for this visit.     Review of Systems:  Review of Systems  Constitutional: Positive for malaise/fatigue. Negative for fever, chills, weight loss and diaphoresis.  HENT: Negative for congestion.   Eyes: Negative for blurred  vision, discharge and redness.  Respiratory: Negative for cough, hemoptysis, sputum production, shortness of breath and wheezing.   Cardiovascular: Negative for chest pain, palpitations, orthopnea, claudication, leg swelling and PND.  Gastrointestinal: Negative for heartburn, nausea, vomiting and abdominal pain.  Musculoskeletal: Negative for myalgias, back pain, joint pain, falls and neck pain.  Skin: Negative for rash.  Neurological: Positive for weakness. Negative for dizziness, sensory change, speech change, focal weakness and headaches.  Endo/Heme/Allergies: Does not bruise/bleed easily.  Psychiatric/Behavioral: The patient is not nervous/anxious.   All other systems reviewed and are negative.    Physical Exam:  Blood pressure 120/82, pulse 95, height 5\' 7"  (1.702 m), weight 180 lb 4 oz (81.761 kg). BMI: Body mass index is 28.22 kg/(m^2). General: Pleasant, NAD. Psych: Normal affect. Responds to questions with normal affect.  Neuro: Alert and oriented X 3. Moves all extremities spontaneously. HEENT: Normocephalic, atraumatic. EOM intact bilaterally. Sclera anicteric.  Neck: Trachea midline. Supple without bruits or JVD. Lungs:  Respirations regular and unlabored, CTA bilaterally without wheezing, crackles, or rhonchi.  Heart: RRR, normal s3, s4. II/VI systolic murmurs at the apex. No rubs, or gallops.  Abdomen: Soft, non-tender, non-distended, BS + x 4.  Extremities: No clubbing, cyanosis. Trace non-pitting edema to the bilateral mid calves. DP/PT/Radials 2+ and equal bilaterally.   Accessory Clinical Findings:  EKG: NSR, 95 bpm, occasional PVCs, right axis deviation,TWI leads I, V3-V6, nonspecific st/t changes inferior leads   Recent Labs: 12/28/2014: TSH 0.162* 01/26/2015: Magnesium 2.0 03/04/2015: ALT 10* 03/07/2015: BUN 54*; Creatinine, Ser 9.99*; Hemoglobin 11.4*; Platelets 144*; Potassium 4.4; Sodium 137     Component Value Date/Time   CHOL 184 05/31/2013 0249   TRIG 182  05/31/2013 0249   HDL 37* 05/31/2013 0249   VLDL 36 05/31/2013 0249   LDLCALC 111* 05/31/2013 0249    Weights: Wt Readings from Last 3 Encounters:  03/30/15 180 lb 4 oz (81.761 kg)  03/15/15 185 lb 9.6 oz (84.188 kg)  03/07/15 173 lb 1 oz (78.5 kg)    CrCl cannot be calculated (Patient has no serum creatinine result on file.).   Other studies Reviewed: Additional studies/ records that were reviewed today include: [facility] admission.  Assessment & Plan:  1. CAD:  -No angina -Recent nuclear stress test 01/2014 as above -He does note increased fatigue and hypotension post dialysis sessions. Should his symptoms persist or change to being persistent could update his nuclear stress test to evaluate for high risk ischemia  -Continue aspirin, Lopressor, Imdur added  2. Palpitations:  -Asymptomatic  -Continue Lopressor 25 mg bid -He was not discharged on 50 mg bid from his last admission -I did not increase him to 50 mg bid today as this may decrease his BP too much with the addition of Imdur and hydralazine   3. Chronic systolic CHF:  -Volume managed per dialysis  -Euvolemic on exam today -Add Imdur 15 mg daily and hydralazine 10 mg tid -Continue Lopressor 25 mg bid  4. 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 ESRD on HD:  -Per nephrology   6. HLD:  -Continue Lipitor 10 mg  7. HTN: -Well controlled -Add Imdur and hydralazine as above -Continue Lopressor 25 mg bid, did not increase to 50 mg bid as this may lead to hypotension with the addition of his hydralazine and Imdur   Dispo: -Patient to follow up with renal -Follow up 1 month  Current medicines are reviewed at length with the patient today.  The patient did not have any concerns regarding medicines.   Christell Faith, PA-C [place] [address] [place], [place] [address] [phone] [place] 03/30/2015, 2:23 PM

## 2015-03-30 NOTE — Patient Instructions (Addendum)
Medication Instructions:  Please start Imdur 15 mg once daily Please start hydralazine 10 mg three times daily We have refilled your Lipitor  Labwork: None  Testing/Procedures: None  Follow-Up: 1 month

## 2015-03-31 DIAGNOSIS — M542 Cervicalgia: Secondary | ICD-10-CM | POA: Diagnosis not present

## 2015-03-31 DIAGNOSIS — N186 End stage renal disease: Secondary | ICD-10-CM | POA: Diagnosis not present

## 2015-03-31 DIAGNOSIS — I5022 Chronic systolic (congestive) heart failure: Secondary | ICD-10-CM | POA: Diagnosis not present

## 2015-03-31 DIAGNOSIS — I2510 Atherosclerotic heart disease of native coronary artery without angina pectoris: Secondary | ICD-10-CM | POA: Diagnosis not present

## 2015-03-31 DIAGNOSIS — N289 Disorder of kidney and ureter, unspecified: Secondary | ICD-10-CM | POA: Diagnosis not present

## 2015-03-31 DIAGNOSIS — E119 Type 2 diabetes mellitus without complications: Secondary | ICD-10-CM | POA: Diagnosis not present

## 2015-04-01 DIAGNOSIS — Z111 Encounter for screening for respiratory tuberculosis: Secondary | ICD-10-CM | POA: Diagnosis not present

## 2015-04-01 DIAGNOSIS — T82398A Other mechanical complication of other vascular grafts, initial encounter: Secondary | ICD-10-CM | POA: Diagnosis not present

## 2015-04-01 DIAGNOSIS — N186 End stage renal disease: Secondary | ICD-10-CM | POA: Diagnosis not present

## 2015-04-01 DIAGNOSIS — E1129 Type 2 diabetes mellitus with other diabetic kidney complication: Secondary | ICD-10-CM | POA: Diagnosis not present

## 2015-04-01 DIAGNOSIS — S51832A Puncture wound without foreign body of left forearm, initial encounter: Secondary | ICD-10-CM | POA: Diagnosis not present

## 2015-04-01 DIAGNOSIS — N2581 Secondary hyperparathyroidism of renal origin: Secondary | ICD-10-CM | POA: Diagnosis not present

## 2015-04-04 DIAGNOSIS — E1129 Type 2 diabetes mellitus with other diabetic kidney complication: Secondary | ICD-10-CM | POA: Diagnosis not present

## 2015-04-04 DIAGNOSIS — T82398A Other mechanical complication of other vascular grafts, initial encounter: Secondary | ICD-10-CM | POA: Diagnosis not present

## 2015-04-04 DIAGNOSIS — S51832A Puncture wound without foreign body of left forearm, initial encounter: Secondary | ICD-10-CM | POA: Diagnosis not present

## 2015-04-04 DIAGNOSIS — Z111 Encounter for screening for respiratory tuberculosis: Secondary | ICD-10-CM | POA: Diagnosis not present

## 2015-04-04 DIAGNOSIS — N186 End stage renal disease: Secondary | ICD-10-CM | POA: Diagnosis not present

## 2015-04-04 DIAGNOSIS — N2581 Secondary hyperparathyroidism of renal origin: Secondary | ICD-10-CM | POA: Diagnosis not present

## 2015-04-05 ENCOUNTER — Encounter: Payer: Self-pay | Admitting: Family Medicine

## 2015-04-05 ENCOUNTER — Ambulatory Visit (INDEPENDENT_AMBULATORY_CARE_PROVIDER_SITE_OTHER): Payer: Medicare Other | Admitting: Family Medicine

## 2015-04-05 VITALS — BP 136/78 | HR 94 | Temp 97.7°F | Resp 16 | Ht 67.0 in | Wt 180.0 lb

## 2015-04-05 DIAGNOSIS — E1129 Type 2 diabetes mellitus with other diabetic kidney complication: Secondary | ICD-10-CM

## 2015-04-05 DIAGNOSIS — R531 Weakness: Secondary | ICD-10-CM | POA: Diagnosis not present

## 2015-04-05 DIAGNOSIS — I2510 Atherosclerotic heart disease of native coronary artery without angina pectoris: Secondary | ICD-10-CM | POA: Diagnosis not present

## 2015-04-05 DIAGNOSIS — N058 Unspecified nephritic syndrome with other morphologic changes: Secondary | ICD-10-CM

## 2015-04-05 DIAGNOSIS — I120 Hypertensive chronic kidney disease with stage 5 chronic kidney disease or end stage renal disease: Secondary | ICD-10-CM | POA: Diagnosis not present

## 2015-04-05 DIAGNOSIS — N186 End stage renal disease: Secondary | ICD-10-CM

## 2015-04-05 DIAGNOSIS — G44229 Chronic tension-type headache, not intractable: Secondary | ICD-10-CM | POA: Diagnosis not present

## 2015-04-05 DIAGNOSIS — I5022 Chronic systolic (congestive) heart failure: Secondary | ICD-10-CM

## 2015-04-05 NOTE — Assessment & Plan Note (Signed)
EF 35-40%. Managed by cardiology. Fluid status managed by nephrology.  Takes aspirin and beta blocker.

## 2015-04-05 NOTE — Patient Instructions (Addendum)
Please call Dr. Wallene Huh office  To schedule your colonoscopy.  Please call [facility]: [phone] to schedule an appt.

## 2015-04-05 NOTE — Assessment & Plan Note (Signed)
HgA1c is 6.1%.  Pt would prefer no medication to treat. Discussed diet- avoid concentrated sweets. Drink main water. Encouraged continued exercise and maintaining healthy weight to control.  Pt reports there was an issue with retina on his yearly eye exam- was referred by optometrist to opthamology.  Foot exam done.  Anuric- unable to do microalbumin. Unsure why nephrology has stopped ACE.

## 2015-04-05 NOTE — Assessment & Plan Note (Signed)
Controlled today. Taking medications as directed. Unsure why ACE/ARB not given by nephrology.  Pt will schedule follow-up with nephrology. Will continue to follow nephrology recommendations.

## 2015-04-05 NOTE — Assessment & Plan Note (Signed)
Improving with PT. Continued to encouraged PT. Likely related to frequent hospitalizations.

## 2015-04-05 NOTE — Progress Notes (Signed)
Subjective:    Patient ID: Luis Walter, male    DOB: [birth year], 64 y.o.   MRN: [id number]  HPI: Luis Walter is a 64 y.o. male presenting on 04/05/2015 for Follow-up   HPI  Pt presents for follow-up of generalized weakness, HA, and HTN.  He was recently seen by neurology and started on medication for HA. He reports HA have resolved. He is also more awake and alert at this visit then previous visit. He is working with physical therapy to help with weakness. Ambulating with a cane per PT recommendation. Pt does not have cane with him today.  ESRD: HD MWF. Managed by [facility] Diabetes: A1c is 6.1%. Has opted for no medications. Avg Sugars at 120-130. Eye appt- 2 weeks ago. Newborn- had questions about his retina was referred to specialist.  BP's are controlled at home. CHF: Seen by cardiology. Stable. Some question of if too much fluid is being removed during hemodialysis. Pt instructed to follow-up with nephrology.   Needs colonoscopy- is being cared for by Dr. Ila Mcgill  Past Medical History  Diagnosis Date  . ESRD (end stage renal disease)     a. on HD M,W,F; b. previously on transplant list at [facility] - removed 2016  . DM2 (diabetes mellitus, type 2)   . CAD (coronary artery disease)     a. cardiac cath 2014: LM nl, mLAD 30%, dLAD 30%, ostLCx 50%, mid ramus 20% OM3 80% pRCA 20%, mRCA 20%, PDA 100% w/ left to right collats, medically managed   . Stroke   . Chronic systolic CHF (congestive heart failure)     a. echo 12/2014: EF 35-40%, mod diffuse HK, RWMA cannot be excluded, LV diastolic fxn parameters nl, mild MR, moderately dilated LA, mildly dilated RV internal cavity size, mild TR, PASP nl  . Gallstones   . Dialysis patient     Current Outpatient Prescriptions on File Prior to Visit  Medication Sig  . aspirin EC 325 MG tablet Take 325 mg by mouth daily.  Marland Kitchen atorvastatin (LIPITOR) 10 MG tablet Take 1 tablet (10 mg total) by mouth daily.  . hydrALAZINE (APRESOLINE) 10 MG  tablet Take 1 tablet (10 mg total) by mouth 3 (three) times daily.  . isosorbide mononitrate (IMDUR) 30 MG 24 hr tablet Take 0.5 tablets (15 mg total) by mouth daily.  . metoprolol tartrate (LOPRESSOR) 25 MG tablet Take 1 tablet (25 mg total) by mouth 2 (two) times daily.  . Misc. Devices (CANE) MISC 1 each by Does not apply route daily. Please get 3 prong or wide based cane for ambulation.  . nortriptyline (PAMELOR) 10 MG capsule Take 1 capsule at night for one week, then increase to 2 capsules at night and continue this dose.  . sevelamer carbonate (RENVELA) 800 MG tablet Take 3,200 mg by mouth 3 (three) times daily with meals.   No current facility-administered medications on file prior to visit.    Review of Systems  Constitutional: Negative for fever and chills.  Respiratory: Negative for chest tightness, shortness of breath and wheezing.   Cardiovascular: Negative for chest pain.  Gastrointestinal: Negative.   Endocrine: Negative for cold intolerance, heat intolerance, polydipsia, polyphagia and polyuria.  Neurological: Negative for light-headedness, numbness and headaches.  Psychiatric/Behavioral: Negative.    Per HPI unless specifically indicated above     Objective:    BP 136/78 mmHg  Pulse 94  Temp(Src) 97.7 F (36.5 C) (Oral)  Resp 16  Ht 5\' 7"  (1.702 m)  Wt 180 lb (81.647 kg)  BMI 28.19 kg/m2  Wt Readings from Last 3 Encounters:  04/05/15 180 lb (81.647 kg)  03/30/15 180 lb 4 oz (81.761 kg)  03/15/15 185 lb 9.6 oz (84.188 kg)    Physical Exam  Constitutional: He is oriented to person, place, and time. He appears well-developed and well-nourished. No distress.  Neck: Normal range of motion. Neck supple. No thyromegaly present.  Cardiovascular: Normal rate and regular rhythm.  Exam reveals no gallop and no friction rub.   No murmur heard. + thrill and bruit on LUE AV fistula.   Pulmonary/Chest: Effort normal and breath sounds normal.  Abdominal: Soft. Bowel  sounds are normal. There is no tenderness. There is no rebound.  Musculoskeletal: Normal range of motion. He exhibits no edema or tenderness.  Lymphadenopathy:    He has no cervical adenopathy.  Neurological: He is alert and oriented to person, place, and time.  Skin: Skin is warm and dry. He is not diaphoretic.   Results for orders placed or performed in visit on 04/05/15  Colonoscopy  Result Value Ref Range                                .        Assessment & Plan:   Problem List Items Addressed This Visit      Cardiovascular and Mediastinum   Chronic systolic CHF (congestive heart failure) - Primary    EF 35-40%. Managed by cardiology. Fluid status managed by nephrology.  Takes aspirin and beta blocker.       Benign hypertension with ESRD (end-stage renal disease)    Controlled today. Taking medications as directed. Unsure why ACE/ARB not given by nephrology.  Pt will schedule follow-up with nephrology. Will continue to follow nephrology recommendations.         Endocrine   Type 2 diabetes, controlled, with renal manifestation    HgA1c is 6.1%.  Pt would prefer no medication to treat. Discussed diet- avoid concentrated sweets. Drink main water. Encouraged continued exercise and maintaining healthy weight to control.  Pt reports there was an issue with retina on his yearly eye exam- was referred by optometrist to opthamology.  Foot exam done.  Anuric- unable to do microalbumin. Unsure why nephrology has stopped ACE.         Genitourinary   ESRD (end stage renal disease)    Managed by [facility]. Hemodilaysis MWF.  Pt will need to schedule follow-up to see nephrology to discuss fluid status.         Other   Generalized weakness    Improving with PT. Continued to encouraged PT. Likely related to frequent hospitalizations.       Cephalalgia    Improved with amitriptyline as prescribed by neurology.          No orders of the defined types were placed in  this encounter.      Follow up plan: Return in about 3 months (around 07/06/2015).

## 2015-04-05 NOTE — Assessment & Plan Note (Signed)
Managed by [facility]. Hemodilaysis MWF.  Pt will need to schedule follow-up to see nephrology to discuss fluid status.

## 2015-04-05 NOTE — Assessment & Plan Note (Signed)
Improved with amitriptyline as prescribed by neurology.

## 2015-04-06 DIAGNOSIS — E1129 Type 2 diabetes mellitus with other diabetic kidney complication: Secondary | ICD-10-CM | POA: Diagnosis not present

## 2015-04-06 DIAGNOSIS — S51832A Puncture wound without foreign body of left forearm, initial encounter: Secondary | ICD-10-CM | POA: Diagnosis not present

## 2015-04-06 DIAGNOSIS — N2581 Secondary hyperparathyroidism of renal origin: Secondary | ICD-10-CM | POA: Diagnosis not present

## 2015-04-06 DIAGNOSIS — N186 End stage renal disease: Secondary | ICD-10-CM | POA: Diagnosis not present

## 2015-04-06 DIAGNOSIS — Z111 Encounter for screening for respiratory tuberculosis: Secondary | ICD-10-CM | POA: Diagnosis not present

## 2015-04-06 DIAGNOSIS — T82398A Other mechanical complication of other vascular grafts, initial encounter: Secondary | ICD-10-CM | POA: Diagnosis not present

## 2015-04-07 DIAGNOSIS — I5022 Chronic systolic (congestive) heart failure: Secondary | ICD-10-CM | POA: Diagnosis not present

## 2015-04-07 DIAGNOSIS — I2510 Atherosclerotic heart disease of native coronary artery without angina pectoris: Secondary | ICD-10-CM | POA: Diagnosis not present

## 2015-04-07 DIAGNOSIS — M542 Cervicalgia: Secondary | ICD-10-CM | POA: Diagnosis not present

## 2015-04-07 DIAGNOSIS — N289 Disorder of kidney and ureter, unspecified: Secondary | ICD-10-CM | POA: Diagnosis not present

## 2015-04-07 DIAGNOSIS — E119 Type 2 diabetes mellitus without complications: Secondary | ICD-10-CM | POA: Diagnosis not present

## 2015-04-07 DIAGNOSIS — N186 End stage renal disease: Secondary | ICD-10-CM | POA: Diagnosis not present

## 2015-04-08 DIAGNOSIS — T82398A Other mechanical complication of other vascular grafts, initial encounter: Secondary | ICD-10-CM | POA: Diagnosis not present

## 2015-04-08 DIAGNOSIS — N2581 Secondary hyperparathyroidism of renal origin: Secondary | ICD-10-CM | POA: Diagnosis not present

## 2015-04-08 DIAGNOSIS — Z5181 Encounter for therapeutic drug level monitoring: Secondary | ICD-10-CM | POA: Diagnosis not present

## 2015-04-08 DIAGNOSIS — Z111 Encounter for screening for respiratory tuberculosis: Secondary | ICD-10-CM | POA: Diagnosis not present

## 2015-04-08 DIAGNOSIS — N186 End stage renal disease: Secondary | ICD-10-CM | POA: Diagnosis not present

## 2015-04-08 DIAGNOSIS — S51832A Puncture wound without foreign body of left forearm, initial encounter: Secondary | ICD-10-CM | POA: Diagnosis not present

## 2015-04-08 DIAGNOSIS — E1129 Type 2 diabetes mellitus with other diabetic kidney complication: Secondary | ICD-10-CM | POA: Diagnosis not present

## 2015-04-11 DIAGNOSIS — S51832A Puncture wound without foreign body of left forearm, initial encounter: Secondary | ICD-10-CM | POA: Diagnosis not present

## 2015-04-11 DIAGNOSIS — N2581 Secondary hyperparathyroidism of renal origin: Secondary | ICD-10-CM | POA: Diagnosis not present

## 2015-04-11 DIAGNOSIS — T82398A Other mechanical complication of other vascular grafts, initial encounter: Secondary | ICD-10-CM | POA: Diagnosis not present

## 2015-04-11 DIAGNOSIS — E1129 Type 2 diabetes mellitus with other diabetic kidney complication: Secondary | ICD-10-CM | POA: Diagnosis not present

## 2015-04-11 DIAGNOSIS — Z111 Encounter for screening for respiratory tuberculosis: Secondary | ICD-10-CM | POA: Diagnosis not present

## 2015-04-11 DIAGNOSIS — N186 End stage renal disease: Secondary | ICD-10-CM | POA: Diagnosis not present

## 2015-04-12 DIAGNOSIS — E119 Type 2 diabetes mellitus without complications: Secondary | ICD-10-CM | POA: Diagnosis not present

## 2015-04-12 DIAGNOSIS — I5022 Chronic systolic (congestive) heart failure: Secondary | ICD-10-CM | POA: Diagnosis not present

## 2015-04-12 DIAGNOSIS — N289 Disorder of kidney and ureter, unspecified: Secondary | ICD-10-CM | POA: Diagnosis not present

## 2015-04-12 DIAGNOSIS — I2510 Atherosclerotic heart disease of native coronary artery without angina pectoris: Secondary | ICD-10-CM | POA: Diagnosis not present

## 2015-04-12 DIAGNOSIS — N186 End stage renal disease: Secondary | ICD-10-CM | POA: Diagnosis not present

## 2015-04-12 DIAGNOSIS — M542 Cervicalgia: Secondary | ICD-10-CM | POA: Diagnosis not present

## 2015-04-13 DIAGNOSIS — E1129 Type 2 diabetes mellitus with other diabetic kidney complication: Secondary | ICD-10-CM | POA: Diagnosis not present

## 2015-04-13 DIAGNOSIS — S51832A Puncture wound without foreign body of left forearm, initial encounter: Secondary | ICD-10-CM | POA: Diagnosis not present

## 2015-04-13 DIAGNOSIS — Z111 Encounter for screening for respiratory tuberculosis: Secondary | ICD-10-CM | POA: Diagnosis not present

## 2015-04-13 DIAGNOSIS — T82398A Other mechanical complication of other vascular grafts, initial encounter: Secondary | ICD-10-CM | POA: Diagnosis not present

## 2015-04-13 DIAGNOSIS — N2581 Secondary hyperparathyroidism of renal origin: Secondary | ICD-10-CM | POA: Diagnosis not present

## 2015-04-13 DIAGNOSIS — N186 End stage renal disease: Secondary | ICD-10-CM | POA: Diagnosis not present

## 2015-04-14 ENCOUNTER — Encounter: Payer: Self-pay | Admitting: Cardiovascular Disease

## 2015-04-14 ENCOUNTER — Ambulatory Visit (INDEPENDENT_AMBULATORY_CARE_PROVIDER_SITE_OTHER): Payer: Medicare Other | Admitting: Cardiovascular Disease

## 2015-04-14 VITALS — BP 142/85 | HR 84 | Ht 68.0 in | Wt 181.8 lb

## 2015-04-14 DIAGNOSIS — I2510 Atherosclerotic heart disease of native coronary artery without angina pectoris: Secondary | ICD-10-CM

## 2015-04-14 DIAGNOSIS — I5022 Chronic systolic (congestive) heart failure: Secondary | ICD-10-CM

## 2015-04-14 DIAGNOSIS — I120 Hypertensive chronic kidney disease with stage 5 chronic kidney disease or end stage renal disease: Secondary | ICD-10-CM | POA: Diagnosis not present

## 2015-04-14 DIAGNOSIS — N186 End stage renal disease: Secondary | ICD-10-CM

## 2015-04-14 MED ORDER — ASPIRIN EC 81 MG PO TBEC: 81.0000 mg | DELAYED_RELEASE_TABLET | Freq: Every day | ORAL | Status: DC

## 2015-04-14 NOTE — Assessment & Plan Note (Signed)
He has no anginal symptoms. I recommend continuing medical therapy. I decreased aspirin to 81 mg once daily.

## 2015-04-14 NOTE — Assessment & Plan Note (Signed)
Blood pressure is reasonably controlled on current medications. 

## 2015-04-14 NOTE — Patient Instructions (Signed)
Medication Instructions:  Your physician has recommended you make the following change in your medication:  DECREASE aspirin to 81mg  once per day   Labwork: none  Testing/Procedures: none  Follow-Up: Your physician wants you to follow-up in: six months with Dr. Fletcher Anon.  You will receive a reminder letter in the mail two months in advance. If you don't receive a letter, please call our office to schedule the follow-up appointment.   Any Other Special Instructions Will Be Listed Below (If Applicable).

## 2015-04-14 NOTE — Assessment & Plan Note (Signed)
He appears to be euvolemic. Continue treatment with metoprolol, a combination of hydralazine and Imdur. I did not increase any of his medications today due to intermittent hypotension during dialysis.

## 2015-04-14 NOTE — Progress Notes (Signed)
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  Recent hospitalization in May 2016 for possible Afib while in dialysis in the setting of sepsis, GNR bacteremia, and new onset anemia. Telemetry showed frequent ectopy with sinus tachycardia, frequent PVCs, PACs, and blocked PACs. No true arrhythmia. GI planning outpatient work up. Repeat hospitalization in June for diarrhea.   most recent echocardiogram in May 2016 showed an ejection fraction of 35-40% with global hypokinesis, mild mitral regurgitation and mild tricuspid regurgitation with normal pulmonary pressure. During last visit, hydralazine and Imdur were both added. He has been doing reasonably well and denies chest pain, significant dyspnea or palpitations. He reports that his blood pressure occasionally drops during dialysis.   No Known Allergies   Current Outpatient Prescriptions on File Prior to Visit  Medication Sig Dispense Refill  . atorvastatin (LIPITOR) 10 MG tablet Take 1 tablet (10 mg total) by mouth daily. 90 tablet 3  . hydrALAZINE (APRESOLINE) 10 MG tablet Take 1 tablet (10 mg total) by mouth 3 (three) times daily. 90 tablet 6  . isosorbide mononitrate (IMDUR) 30 MG 24 hr tablet Take 0.5 tablets (15 mg total) by mouth daily. 30 tablet 3  . metoprolol tartrate (LOPRESSOR) 25 MG tablet Take 1 tablet (25 mg total) by mouth 2 (two) times daily. 60 tablet 0  . Misc. Devices (CANE) MISC 1 each by Does not apply route daily. Please get 3 prong or wide based cane for ambulation. 1 each 0  . nortriptyline (PAMELOR) 10 MG capsule Take 1 capsule at night for one week, then increase to 2 capsules at night and continue this dose.    . sevelamer carbonate (RENVELA) 800 MG tablet Take 3,200 mg by mouth 3 (three)  times daily with meals.     No current facility-administered medications on file prior to visit.     Past Medical History  Diagnosis Date  . ESRD (end stage renal disease)     a. on HD M,W,F; b. previously on transplant list at [facility] - removed 2016  . DM2 (diabetes mellitus, type 2)   . CAD (coronary artery disease)     a. cardiac cath 2014: LM nl, mLAD 30%, dLAD 30%, ostLCx 50%, mid ramus 20% OM3 80% pRCA 20%, mRCA 20%, PDA 100% w/ left to right collats, medically managed   . Stroke   . Chronic systolic CHF (congestive heart failure)     a. echo 12/2014: EF 35-40%, mod diffuse HK, RWMA cannot be excluded, LV diastolic fxn parameters nl, mild MR, moderately dilated LA, mildly dilated RV internal cavity size, mild TR, PASP nl  . Gallstones   . Dialysis patient      Past Surgical History  Procedure Laterality Date  . Av fistula placement Left      Family History  Problem Relation Age of Onset  . Hypertension    . Diabetes    . Diabetes Mother   . Diabetes Father      Social History   Social History  . Marital Status: Married    Spouse Name: N/A  . Number of Children: N/A  . Years of Education: N/A   Occupational History  . Not on file.   Social History Main Topics  . Smoking status: Former Research scientist (life sciences)  .  Smokeless tobacco: Not on file  . Alcohol Use: No  . Drug Use: No  . Sexual Activity: Not on file   Other Topics Concern  . Not on file   Social History Narrative      PHYSICAL EXAM   BP 142/85 mmHg  Pulse 84  Ht 5\' 8"  (1.727 m)  Wt 181 lb 12 oz (82.441 kg)  BMI 27.64 kg/m2 Constitutional: He is oriented to person, place, and time. He appears well-developed and well-nourished. No distress.  HENT: No nasal discharge.  Head: Normocephalic and atraumatic.  Eyes: Pupils are equal and round.  No discharge. Neck: Normal range of motion. Neck supple. No JVD present. No thyromegaly present.  Cardiovascular: Normal rate, regular rhythm, normal heart sounds.  Exam reveals no gallop and no friction rub. No murmur heard.  Pulmonary/Chest: Effort normal and breath sounds normal. No stridor. No respiratory distress. He has no wheezes. He has no rales. He exhibits no tenderness.  Abdominal: Soft. Bowel sounds are normal. He exhibits no distension. There is no tenderness. There is no rebound and no guarding.  Musculoskeletal: Normal range of motion. He exhibits no edema and no tenderness. He has a left arm fistula.  Neurological: He is alert and oriented to person, place, and time. Coordination normal.  Skin: Skin is warm and dry. No rash noted. He is not diaphoretic. No erythema. No pallor.  Psychiatric: He has a normal mood and affect. His behavior is normal. Judgment and thought content normal.      ASSESSMENT AND PLAN

## 2015-04-15 DIAGNOSIS — E1129 Type 2 diabetes mellitus with other diabetic kidney complication: Secondary | ICD-10-CM | POA: Diagnosis not present

## 2015-04-15 DIAGNOSIS — S51832A Puncture wound without foreign body of left forearm, initial encounter: Secondary | ICD-10-CM | POA: Diagnosis not present

## 2015-04-15 DIAGNOSIS — T82398A Other mechanical complication of other vascular grafts, initial encounter: Secondary | ICD-10-CM | POA: Diagnosis not present

## 2015-04-15 DIAGNOSIS — N186 End stage renal disease: Secondary | ICD-10-CM | POA: Diagnosis not present

## 2015-04-15 DIAGNOSIS — Z111 Encounter for screening for respiratory tuberculosis: Secondary | ICD-10-CM | POA: Diagnosis not present

## 2015-04-15 DIAGNOSIS — N2581 Secondary hyperparathyroidism of renal origin: Secondary | ICD-10-CM | POA: Diagnosis not present

## 2015-04-18 DIAGNOSIS — S51832A Puncture wound without foreign body of left forearm, initial encounter: Secondary | ICD-10-CM | POA: Diagnosis not present

## 2015-04-18 DIAGNOSIS — N186 End stage renal disease: Secondary | ICD-10-CM | POA: Diagnosis not present

## 2015-04-18 DIAGNOSIS — N2581 Secondary hyperparathyroidism of renal origin: Secondary | ICD-10-CM | POA: Diagnosis not present

## 2015-04-18 DIAGNOSIS — Z111 Encounter for screening for respiratory tuberculosis: Secondary | ICD-10-CM | POA: Diagnosis not present

## 2015-04-18 DIAGNOSIS — T82398A Other mechanical complication of other vascular grafts, initial encounter: Secondary | ICD-10-CM | POA: Diagnosis not present

## 2015-04-18 DIAGNOSIS — E1129 Type 2 diabetes mellitus with other diabetic kidney complication: Secondary | ICD-10-CM | POA: Diagnosis not present

## 2015-04-20 DIAGNOSIS — Z111 Encounter for screening for respiratory tuberculosis: Secondary | ICD-10-CM | POA: Diagnosis not present

## 2015-04-20 DIAGNOSIS — T82398A Other mechanical complication of other vascular grafts, initial encounter: Secondary | ICD-10-CM | POA: Diagnosis not present

## 2015-04-20 DIAGNOSIS — Z992 Dependence on renal dialysis: Secondary | ICD-10-CM | POA: Diagnosis not present

## 2015-04-20 DIAGNOSIS — N2581 Secondary hyperparathyroidism of renal origin: Secondary | ICD-10-CM | POA: Diagnosis not present

## 2015-04-20 DIAGNOSIS — S51832A Puncture wound without foreign body of left forearm, initial encounter: Secondary | ICD-10-CM | POA: Diagnosis not present

## 2015-04-20 DIAGNOSIS — E1129 Type 2 diabetes mellitus with other diabetic kidney complication: Secondary | ICD-10-CM | POA: Diagnosis not present

## 2015-04-20 DIAGNOSIS — N186 End stage renal disease: Secondary | ICD-10-CM | POA: Diagnosis not present

## 2015-04-20 DIAGNOSIS — E1122 Type 2 diabetes mellitus with diabetic chronic kidney disease: Secondary | ICD-10-CM | POA: Diagnosis not present

## 2015-04-22 DIAGNOSIS — A498 Other bacterial infections of unspecified site: Secondary | ICD-10-CM | POA: Diagnosis not present

## 2015-04-22 DIAGNOSIS — N2581 Secondary hyperparathyroidism of renal origin: Secondary | ICD-10-CM | POA: Diagnosis not present

## 2015-04-22 DIAGNOSIS — E1129 Type 2 diabetes mellitus with other diabetic kidney complication: Secondary | ICD-10-CM | POA: Diagnosis not present

## 2015-04-22 DIAGNOSIS — R509 Fever, unspecified: Secondary | ICD-10-CM | POA: Diagnosis not present

## 2015-04-22 DIAGNOSIS — N186 End stage renal disease: Secondary | ICD-10-CM | POA: Diagnosis not present

## 2015-04-22 DIAGNOSIS — D509 Iron deficiency anemia, unspecified: Secondary | ICD-10-CM | POA: Diagnosis not present

## 2015-04-22 DIAGNOSIS — D631 Anemia in chronic kidney disease: Secondary | ICD-10-CM | POA: Diagnosis not present

## 2015-04-25 DIAGNOSIS — R509 Fever, unspecified: Secondary | ICD-10-CM | POA: Diagnosis not present

## 2015-04-25 DIAGNOSIS — N2581 Secondary hyperparathyroidism of renal origin: Secondary | ICD-10-CM | POA: Diagnosis not present

## 2015-04-25 DIAGNOSIS — N186 End stage renal disease: Secondary | ICD-10-CM | POA: Diagnosis not present

## 2015-04-25 DIAGNOSIS — D509 Iron deficiency anemia, unspecified: Secondary | ICD-10-CM | POA: Diagnosis not present

## 2015-04-25 DIAGNOSIS — A498 Other bacterial infections of unspecified site: Secondary | ICD-10-CM | POA: Diagnosis not present

## 2015-04-25 DIAGNOSIS — E1129 Type 2 diabetes mellitus with other diabetic kidney complication: Secondary | ICD-10-CM | POA: Diagnosis not present

## 2015-04-27 DIAGNOSIS — N2581 Secondary hyperparathyroidism of renal origin: Secondary | ICD-10-CM | POA: Diagnosis not present

## 2015-04-27 DIAGNOSIS — R509 Fever, unspecified: Secondary | ICD-10-CM | POA: Diagnosis not present

## 2015-04-27 DIAGNOSIS — N186 End stage renal disease: Secondary | ICD-10-CM | POA: Diagnosis not present

## 2015-04-27 DIAGNOSIS — D509 Iron deficiency anemia, unspecified: Secondary | ICD-10-CM | POA: Diagnosis not present

## 2015-04-27 DIAGNOSIS — E1129 Type 2 diabetes mellitus with other diabetic kidney complication: Secondary | ICD-10-CM | POA: Diagnosis not present

## 2015-04-27 DIAGNOSIS — A498 Other bacterial infections of unspecified site: Secondary | ICD-10-CM | POA: Diagnosis not present

## 2015-04-28 ENCOUNTER — Telehealth: Payer: Self-pay

## 2015-04-28 NOTE — Telephone Encounter (Signed)
Called patient's wife and explained that she needs to contact [facility] and the # was given. I called and asked if patient could be seen any sooner that scheduled colonoscopy? I told her Dr. Luan Pulling agreed with this as he could order stool cx if they don't.

## 2015-04-28 NOTE — Telephone Encounter (Signed)
Will wait and see if GI can see him soon before making any other suggestions.-jh

## 2015-04-28 NOTE — Telephone Encounter (Signed)
Patient having Severe Diarrhea and has Gi consult 05/19/2015 with [place]. HX of CDiff and is also on dialysis and has that MON WED FRI till  10:30 on those days. Can we try to call Allen Norris or another doctor in GI? Had the CDiff 2 months ago.

## 2015-04-28 NOTE — Telephone Encounter (Signed)
Called [facility] to see if patient could be seen any sooner than 05/19/15. A message will be sent back to the doctors and patient will be called. Dr. Luan Pulling is there anything else you would like to do?

## 2015-04-29 DIAGNOSIS — R509 Fever, unspecified: Secondary | ICD-10-CM | POA: Diagnosis not present

## 2015-04-29 DIAGNOSIS — N2581 Secondary hyperparathyroidism of renal origin: Secondary | ICD-10-CM | POA: Diagnosis not present

## 2015-04-29 DIAGNOSIS — N186 End stage renal disease: Secondary | ICD-10-CM | POA: Diagnosis not present

## 2015-04-29 DIAGNOSIS — D509 Iron deficiency anemia, unspecified: Secondary | ICD-10-CM | POA: Diagnosis not present

## 2015-04-29 DIAGNOSIS — A498 Other bacterial infections of unspecified site: Secondary | ICD-10-CM | POA: Diagnosis not present

## 2015-04-29 DIAGNOSIS — E1129 Type 2 diabetes mellitus with other diabetic kidney complication: Secondary | ICD-10-CM | POA: Diagnosis not present

## 2015-05-02 DIAGNOSIS — D649 Anemia, unspecified: Secondary | ICD-10-CM | POA: Diagnosis not present

## 2015-05-02 DIAGNOSIS — N2581 Secondary hyperparathyroidism of renal origin: Secondary | ICD-10-CM | POA: Diagnosis not present

## 2015-05-02 DIAGNOSIS — D509 Iron deficiency anemia, unspecified: Secondary | ICD-10-CM | POA: Diagnosis not present

## 2015-05-02 DIAGNOSIS — Z8719 Personal history of other diseases of the digestive system: Secondary | ICD-10-CM | POA: Diagnosis not present

## 2015-05-02 DIAGNOSIS — R509 Fever, unspecified: Secondary | ICD-10-CM | POA: Diagnosis not present

## 2015-05-02 DIAGNOSIS — Z86010 Personal history of colonic polyps: Secondary | ICD-10-CM | POA: Diagnosis not present

## 2015-05-02 DIAGNOSIS — A498 Other bacterial infections of unspecified site: Secondary | ICD-10-CM | POA: Diagnosis not present

## 2015-05-02 DIAGNOSIS — N186 End stage renal disease: Secondary | ICD-10-CM | POA: Diagnosis not present

## 2015-05-02 DIAGNOSIS — E1129 Type 2 diabetes mellitus with other diabetic kidney complication: Secondary | ICD-10-CM | POA: Diagnosis not present

## 2015-05-03 ENCOUNTER — Ambulatory Visit
Admission: RE | Admit: 2015-05-03 | Discharge: 2015-05-03 | Disposition: A | Discharge status: Home / Self Care | Payer: Medicare Other | Source: Ambulatory Visit | Attending: Family Medicine | Admitting: Family Medicine

## 2015-05-03 ENCOUNTER — Ambulatory Visit (INDEPENDENT_AMBULATORY_CARE_PROVIDER_SITE_OTHER): Payer: Medicare Other | Admitting: Family Medicine

## 2015-05-03 ENCOUNTER — Encounter: Payer: Self-pay | Admitting: Family Medicine

## 2015-05-03 VITALS — BP 137/89 | HR 84 | Temp 98.2°F | Resp 16 | Ht 68.0 in | Wt 182.6 lb

## 2015-05-03 DIAGNOSIS — R05 Cough: Secondary | ICD-10-CM | POA: Diagnosis not present

## 2015-05-03 DIAGNOSIS — L0201 Cutaneous abscess of face: Secondary | ICD-10-CM

## 2015-05-03 DIAGNOSIS — I2510 Atherosclerotic heart disease of native coronary artery without angina pectoris: Secondary | ICD-10-CM

## 2015-05-03 DIAGNOSIS — R059 Cough, unspecified: Secondary | ICD-10-CM

## 2015-05-03 DIAGNOSIS — R0989 Other specified symptoms and signs involving the circulatory and respiratory systems: Secondary | ICD-10-CM | POA: Insufficient documentation

## 2015-05-03 DIAGNOSIS — J811 Chronic pulmonary edema: Secondary | ICD-10-CM | POA: Diagnosis not present

## 2015-05-03 DIAGNOSIS — I517 Cardiomegaly: Secondary | ICD-10-CM | POA: Diagnosis not present

## 2015-05-03 MED ORDER — LORATADINE 10 MG PO TABS: 10.0000 mg | ORAL_TABLET | Freq: Every day | ORAL | Status: DC

## 2015-05-03 MED ORDER — BENZONATATE 100 MG PO CAPS: 100.0000 mg | ORAL_CAPSULE | Freq: Three times a day (TID) | ORAL | Status: DC | PRN

## 2015-05-03 NOTE — Patient Instructions (Signed)
Keep appt. With Amy Krebs  In Nov.  CXR showed cardiomegly and pulmonary vascular congestion.  No acute cardiopulmonary diease.

## 2015-05-03 NOTE — Progress Notes (Signed)
[name]   MRN: [id number]    DOB: November 18, 1950   Date:05/03/2015       Progress Note  Subjective  Chief Complaint  Chief Complaint  Patient presents with  . Cough    month pt had neumonia in past occassional runny nose     HPI  C/o cough for past 3 month\.  Had pneumonia 2 months ago.  Pneumonia has healed, but cough remains.  Sees. Dr. Fletcher Anon (Card).  Also dialysis3 days a week.   Sees GI.  Colonoscopy and EGD planned next month.  No problem-specific assessment & plan notes found for this encounter.   Past Medical History  Diagnosis Date  . ESRD (end stage renal disease)     a. on HD M,W,F; b. previously on transplant list at [facility] - removed 2016  . DM2 (diabetes mellitus, type 2)   . CAD (coronary artery disease)     a. cardiac cath 2014: LM nl, mLAD 30%, dLAD 30%, ostLCx 50%, mid ramus 20% OM3 80% pRCA 20%, mRCA 20%, PDA 100% w/ left to right collats, medically managed   . Stroke   . Chronic systolic CHF (congestive heart failure)     a. echo 12/2014: EF 35-40%, mod diffuse HK, RWMA cannot be excluded, LV diastolic fxn parameters nl, mild MR, moderately dilated LA, mildly dilated RV internal cavity size, mild TR, PASP nl  . Gallstones   . Dialysis patient     Social History  Substance Use Topics  . Smoking status: Former Research scientist (life sciences)  . Smokeless tobacco: Not on file  . Alcohol Use: No     Current outpatient prescriptions:  .  aspirin EC 81 MG tablet, Take 1 tablet (81 mg total) by mouth daily., Disp: 90 tablet, Rfl: 3 .  atorvastatin (LIPITOR) 10 MG tablet, Take 1 tablet (10 mg total) by mouth daily., Disp: 90 tablet, Rfl: 3 .  hydrALAZINE (APRESOLINE) 10 MG tablet, Take 1 tablet (10 mg total) by mouth 3 (three) times daily., Disp: 90 tablet, Rfl: 6 .  isosorbide mononitrate (IMDUR) 30 MG 24 hr tablet, Take 0.5 tablets (15 mg total) by mouth daily., Disp: 30 tablet, Rfl: 3 .  metoprolol tartrate (LOPRESSOR) 25 MG tablet, Take 1 tablet (25 mg total) by mouth 2  (two) times daily., Disp: 60 tablet, Rfl: 0 .  Misc. Devices (CANE) MISC, 1 each by Does not apply route daily. Please get 3 prong or wide based cane for ambulation., Disp: 1 each, Rfl: 0 .  nortriptyline (PAMELOR) 10 MG capsule, Take 1 capsule at night for one week, then increase to 2 capsules at night and continue this dose., Disp: , Rfl:  .  sevelamer carbonate (RENVELA) 800 MG tablet, Take 3,200 mg by mouth 3 (three) times daily with meals., Disp: , Rfl:   No Known Allergies  Review of Systems  Constitutional: Negative for fever, chills, weight loss and malaise/fatigue.  HENT: Negative for hearing loss.   Eyes: Negative for blurred vision and double vision.  Respiratory: Positive for cough. Negative for sputum production, shortness of breath and wheezing.   Cardiovascular: Negative for chest pain, palpitations, orthopnea and leg swelling.  Gastrointestinal: Positive for diarrhea. Negative for heartburn, nausea, vomiting and abdominal pain.  Genitourinary: Negative for dysuria, urgency and frequency.  Skin: Negative for rash.  Neurological: Negative for dizziness, weakness and headaches.       Objective  Filed Vitals:   05/03/15 1534  BP: 137/89  Pulse: 84  Temp: 98.2 F (36.8  C)  TempSrc: Oral  Resp: 16  Height: _0  (1.727 m)  Weight: 182 lb 9.6 oz (82.827 kg)  SpO2: 99%     Physical Exam  Constitutional: He is well-developed, well-nourished, and in no distress. No distress.  HENT:  Head: Normocephalic and atraumatic.  Right Ear: External ear normal.  Left Ear: External ear normal.  Nose: Mucosal edema and rhinorrhea present. Right sinus exhibits no maxillary sinus tenderness and no frontal sinus tenderness. Left sinus exhibits no maxillary sinus tenderness and no frontal sinus tenderness.  Mouth/Throat: No oropharyngeal exudate, posterior oropharyngeal edema or posterior oropharyngeal erythema.  Eyes: Conjunctivae and EOM are normal. Pupils are equal, round, and  reactive to light.  Neck: Carotid bruit is not present.  Vitals reviewed.      Recent Results (from the past 2160 hour(s))  Basic metabolic panel     Status: Abnormal   Collection Time: 03/01/15  9:16 PM  Result Value Ref Range   Sodium 137 135 - 145 mmol/L   Potassium 3.2 (L) 3.5 - 5.1 mmol/L   Chloride 96 (L) 101 - 111 mmol/L   CO2 31 22 - 32 mmol/L   Glucose, Bld 155 (H) 65 - 99 mg/dL   BUN 20 6 - 20 mg/dL   Creatinine, Ser 7.98 (H) 0.61 - 1.24 mg/dL   Calcium 9.0 8.9 - 10.3 mg/dL   GFR calc non Af Amer 6 (L) >60 mL/min   GFR calc Af Amer 7 (L) >60 mL/min    Comment: (NOTE) The eGFR has been calculated using the CKD EPI equation. This calculation has not been validated in all clinical situations. eGFR's persistently <60 mL/min signify possible Chronic Kidney Disease.    Anion gap 10 5 - 15  CBC     Status: Abnormal   Collection Time: 03/01/15  9:16 PM  Result Value Ref Range   WBC 4.9 3.8 - 10.6 K/uL   RBC 3.69 (L) 4.40 - 5.90 MIL/uL   Hemoglobin 10.3 (L) 13.0 - 18.0 g/dL   HCT 32.5 (L) 40.0 - 52.0 %   MCV 88.1 80.0 - 100.0 fL   MCH 27.9 26.0 - 34.0 pg   MCHC 31.7 (L) 32.0 - 36.0 g/dL   RDW 18.0 (H) 11.5 - 14.5 %   Platelets 160 150 - 440 K/uL  Troponin I     Status: Abnormal   Collection Time: 03/01/15  9:16 PM  Result Value Ref Range   Troponin I 0.09 (H) <0.031 ng/mL    Comment: READ BACK AND VERIFIED BY LAURA CONNER _1  03/01/15.Marland KitchenAJO        PERSISTENTLY INCREASED TROPONIN VALUES IN THE RANGE OF 0.04-0.49 ng/mL CAN BE SEEN IN:       -UNSTABLE ANGINA       -CONGESTIVE HEART FAILURE       -MYOCARDITIS       -CHEST TRAUMA       -ARRYHTHMIAS       -LATE PRESENTING MYOCARDIAL INFARCTION       -COPD   CLINICAL FOLLOW-UP RECOMMENDED.   Troponin I     Status: Abnormal   Collection Time: 03/02/15 12:36 AM  Result Value Ref Range   Troponin I 0.09 (H) <0.031 ng/mL    Comment: RESULTS PREVIOUSLY CALLED BY AJO AT 2216 7.12.16 MPG        PERSISTENTLY  INCREASED TROPONIN VALUES IN THE RANGE OF 0.04-0.49 ng/mL CAN BE SEEN IN:       -UNSTABLE ANGINA       -  CONGESTIVE HEART FAILURE       -MYOCARDITIS       -CHEST TRAUMA       -ARRYHTHMIAS       -LATE PRESENTING MYOCARDIAL INFARCTION       -COPD   CLINICAL FOLLOW-UP RECOMMENDED.   Blood culture (routine x 2)     Status: None   Collection Time: 03/04/15  5:13 PM  Result Value Ref Range   Specimen Description BLOOD RIGHT HAND    Special Requests BOTTLES DRAWN AEROBIC AND ANAEROBIC ANA5ML,AER5ML    Culture NO GROWTH 7 DAYS    Report Status 03/11/2015 FINAL   Blood culture (routine x 2)     Status: None   Collection Time: 03/04/15  5:35 PM  Result Value Ref Range   Specimen Description BLOOD RIGHT ARM    Special Requests [id number]    Culture NO GROWTH 7 DAYS    Report Status 03/11/2015 FINAL   Glucose, capillary     Status: Abnormal   Collection Time: 03/04/15  5:47 PM  Result Value Ref Range   Glucose-Capillary 111 (H) 65 - 99 mg/dL  CBC with Differential     Status: Abnormal   Collection Time: 03/04/15  5:59 PM  Result Value Ref Range   WBC 6.3 3.8 - 10.6 K/uL   RBC 4.09 (L) 4.40 - 5.90 MIL/uL   Hemoglobin 11.3 (L) 13.0 - 18.0 g/dL   HCT 35.6 (L) 40.0 - 52.0 %   MCV 87.1 80.0 - 100.0 fL   MCH 27.6 26.0 - 34.0 pg   MCHC 31.7 (L) 32.0 - 36.0 g/dL   RDW 17.9 (H) 11.5 - 14.5 %   Platelets 132 (L) 150 - 440 K/uL   Neutrophils Relative % 72 %   Neutro Abs 4.5 1.4 - 6.5 K/uL   Lymphocytes Relative 16 %   Lymphs Abs 1.0 1.0 - 3.6 K/uL   Monocytes Relative 11 %   Monocytes Absolute 0.7 0.2 - 1.0 K/uL   Eosinophils Relative 0 %   Eosinophils Absolute 0.0 0 - 0.7 K/uL   Basophils Relative 1 %   Basophils Absolute 0.1 0 - 0.1 K/uL  Comprehensive metabolic panel     Status: Abnormal   Collection Time: 03/04/15  5:59 PM  Result Value Ref Range   Sodium 138 135 - 145 mmol/L   Potassium 3.6 3.5 - 5.1 mmol/L    Comment: HEMOLYSIS AT THIS LEVEL MAY AFFECT RESULT   Chloride  93 (L) 101 - 111 mmol/L   CO2 35 (H) 22 - 32 mmol/L   Glucose, Bld 109 (H) 65 - 99 mg/dL   BUN 16 6 - 20 mg/dL   Creatinine, Ser 5.61 (H) 0.61 - 1.24 mg/dL   Calcium 8.8 (L) 8.9 - 10.3 mg/dL   Total Protein 6.8 6.5 - 8.1 g/dL   Albumin 3.0 (L) 3.5 - 5.0 g/dL   AST 21 15 - 41 U/L   ALT 10 (L) 17 - 63 U/L   Alkaline Phosphatase 68 38 - 126 U/L   Total Bilirubin 1.5 (H) 0.3 - 1.2 mg/dL   GFR calc non Af Amer 10 (L) >60 mL/min   GFR calc Af Amer 11 (L) >60 mL/min    Comment: (NOTE) The eGFR has been calculated using the CKD EPI equation. This calculation has not been validated in all clinical situations. eGFR's persistently <60 mL/min signify possible Chronic Kidney Disease.    Anion gap 10 5 - 15  Troponin I  Status: Abnormal   Collection Time: 03/04/15  5:59 PM  Result Value Ref Range   Troponin I 0.28 (H) <0.031 ng/mL    Comment: READ BACK AND VERIFIED WITH K YUAL AT 1921 03/04/15.PMH        PERSISTENTLY INCREASED TROPONIN VALUES IN THE RANGE OF 0.04-0.49 ng/mL CAN BE SEEN IN:       -UNSTABLE ANGINA       -CONGESTIVE HEART FAILURE       -MYOCARDITIS       -CHEST TRAUMA       -ARRYHTHMIAS       -LATE PRESENTING MYOCARDIAL INFARCTION       -COPD   CLINICAL FOLLOW-UP RECOMMENDED.   Lactic acid, plasma     Status: None   Collection Time: 03/04/15  6:12 PM  Result Value Ref Range   Lactic Acid, Venous 1.8 0.5 - 2.0 mmol/L  CSF cell count with differential collection tube #: 1     Status: Abnormal   Collection Time: 03/04/15  7:24 PM  Result Value Ref Range   Tube # 1    Color, CSF COLORLESS COLORLESS   Appearance, CSF CLEAR (A) CLEAR   RBC Count, CSF 431 (H) 0 - 3 /cu mm   WBC, CSF 0 /cu mm   Segmented Neutrophils-CSF 0 %   Lymphs, CSF 0 %   Monocyte-Macrophage-Spinal Fluid 0 %   Eosinophils, CSF 0 %   Other Cells, CSF 0   Glucose, CSF     Status: Abnormal   Collection Time: 03/04/15  7:24 PM  Result Value Ref Range   Glucose, CSF 71 (H) 40 - 70 mg/dL  Protein,  CSF     Status: Abnormal   Collection Time: 03/04/15  7:24 PM  Result Value Ref Range   Total  Protein, CSF 79 (H) 15 - 45 mg/dL  CSF culture with Stat gram stain     Status: None   Collection Time: 03/04/15  7:27 PM  Result Value Ref Range   Specimen Description CSF    Special Requests Normal    Gram Stain      NO ORGANISMS SEEN FEW WBC SEEN MANY RBC SEEN CONFIRMED BY RWW    Culture NO GROWTH 3 DAYS    Report Status 03/07/2015 FINAL   Lactic acid, plasma     Status: None   Collection Time: 03/04/15  9:07 PM  Result Value Ref Range   Lactic Acid, Venous 1.4 0.5 - 2.0 mmol/L  Troponin I (q 6hr x 3)     Status: Abnormal   Collection Time: 03/04/15 11:16 PM  Result Value Ref Range   Troponin I 0.29 (H) <0.031 ng/mL    Comment: RESULTS PREVIOUSLY CALLED AT 1921 03/04/15.PMH        PERSISTENTLY INCREASED TROPONIN VALUES IN THE RANGE OF 0.04-0.49 ng/mL CAN BE SEEN IN:       -UNSTABLE ANGINA       -CONGESTIVE HEART FAILURE       -MYOCARDITIS       -CHEST TRAUMA       -ARRYHTHMIAS       -LATE PRESENTING MYOCARDIAL INFARCTION       -COPD   CLINICAL FOLLOW-UP RECOMMENDED.   Troponin I (q 6hr x 3)     Status: Abnormal   Collection Time: 03/05/15  4:48 AM  Result Value Ref Range   Troponin I 0.26 (H) <0.031 ng/mL    Comment: RESULTS PREVIOUSLY CALLED AT 1921 03/04/15.PMH  PERSISTENTLY INCREASED TROPONIN VALUES IN THE RANGE OF 0.04-0.49 ng/mL CAN BE SEEN IN:       -UNSTABLE ANGINA       -CONGESTIVE HEART FAILURE       -MYOCARDITIS       -CHEST TRAUMA       -ARRYHTHMIAS       -LATE PRESENTING MYOCARDIAL INFARCTION       -COPD   CLINICAL FOLLOW-UP RECOMMENDED.   Glucose, capillary     Status: Abnormal   Collection Time: 03/05/15  4:48 AM  Result Value Ref Range   Glucose-Capillary 119 (H) 65 - 99 mg/dL  Troponin I (q 6hr x 3)     Status: Abnormal   Collection Time: 03/05/15  1:01 PM  Result Value Ref Range   Troponin I 0.24 (H) <0.031 ng/mL    Comment: RESULTS  PREVIOUSLY CALLED ON 03/04/15 AT 1921        PERSISTENTLY INCREASED TROPONIN VALUES IN THE RANGE OF 0.04-0.49 ng/mL CAN BE SEEN IN:       -UNSTABLE ANGINA       -CONGESTIVE HEART FAILURE       -MYOCARDITIS       -CHEST TRAUMA       -ARRYHTHMIAS       -LATE PRESENTING MYOCARDIAL INFARCTION       -COPD   CLINICAL FOLLOW-UP RECOMMENDED.   Glucose, capillary     Status: Abnormal   Collection Time: 03/05/15  8:16 PM  Result Value Ref Range   Glucose-Capillary 255 (H) 65 - 99 mg/dL  Hepatitis B surface antigen     Status: None   Collection Time: 03/07/15  9:53 AM  Result Value Ref Range   Hepatitis B Surface Ag Negative Negative    Comment: (NOTE) Performed At: [facility] [place], [place] [id number] Lindon Romp MD WU:[id number]   Renal function panel     Status: Abnormal   Collection Time: 03/07/15  9:53 AM  Result Value Ref Range   Sodium 137 135 - 145 mmol/L   Potassium 4.4 3.5 - 5.1 mmol/L   Chloride 95 (L) 101 - 111 mmol/L   CO2 30 22 - 32 mmol/L   Glucose, Bld 334 (H) 65 - 99 mg/dL   BUN 54 (H) 6 - 20 mg/dL   Creatinine, Ser 9.99 (H) 0.61 - 1.24 mg/dL   Calcium 8.8 (L) 8.9 - 10.3 mg/dL   Phosphorus 6.0 (H) 2.5 - 4.6 mg/dL   Albumin 3.0 (L) 3.5 - 5.0 g/dL   GFR calc non Af Amer 5 (L) >60 mL/min   GFR calc Af Amer 6 (L) >60 mL/min    Comment: (NOTE) The eGFR has been calculated using the CKD EPI equation. This calculation has not been validated in all clinical situations. eGFR's persistently <60 mL/min signify possible Chronic Kidney Disease.    Anion gap 12 5 - 15  CBC     Status: Abnormal   Collection Time: 03/07/15  9:53 AM  Result Value Ref Range   WBC 5.1 3.8 - 10.6 K/uL   RBC 4.15 (L) 4.40 - 5.90 MIL/uL   Hemoglobin 11.4 (L) 13.0 - 18.0 g/dL   HCT 35.8 (L) 40.0 - 52.0 %   MCV 86.1 80.0 - 100.0 fL   MCH 27.5 26.0 - 34.0 pg   MCHC 31.9 (L) 32.0 - 36.0 g/dL   RDW 17.2 (H) 11.5 - 14.5 %   Platelets 144 (L) 150 - 440 K/uL  POCT  HgB  A1C     Status: Normal   Collection Time: 03/17/15  8:53 AM  Result Value Ref Range   Hemoglobin A1C 6.1      Assessment & Plan  1\1. Cough  - DG Chest 2 View; Future - loratadine (CLARITIN) 10 MG tablet; Take 1 tablet (10 mg total) by mouth daily.  Dispense: 30 tablet; Refill: 11 - benzonatate (TESSALON) 100 MG capsule; Take 1 capsule (100 mg total) by mouth 3 (three) times daily as needed for cough.  Dispense: 30 capsule; Refill: 3  2. Facial abscess  - Ambulatory referral to Dermatology

## 2015-05-04 DIAGNOSIS — R509 Fever, unspecified: Secondary | ICD-10-CM | POA: Diagnosis not present

## 2015-05-04 DIAGNOSIS — N2581 Secondary hyperparathyroidism of renal origin: Secondary | ICD-10-CM | POA: Diagnosis not present

## 2015-05-04 DIAGNOSIS — D509 Iron deficiency anemia, unspecified: Secondary | ICD-10-CM | POA: Diagnosis not present

## 2015-05-04 DIAGNOSIS — A498 Other bacterial infections of unspecified site: Secondary | ICD-10-CM | POA: Diagnosis not present

## 2015-05-04 DIAGNOSIS — E1129 Type 2 diabetes mellitus with other diabetic kidney complication: Secondary | ICD-10-CM | POA: Diagnosis not present

## 2015-05-04 DIAGNOSIS — N186 End stage renal disease: Secondary | ICD-10-CM | POA: Diagnosis not present

## 2015-05-06 ENCOUNTER — Encounter: Payer: Self-pay | Admitting: *Deleted

## 2015-05-06 ENCOUNTER — Encounter: Payer: Self-pay | Admitting: Family Medicine

## 2015-05-06 DIAGNOSIS — D509 Iron deficiency anemia, unspecified: Secondary | ICD-10-CM | POA: Diagnosis not present

## 2015-05-06 DIAGNOSIS — R509 Fever, unspecified: Secondary | ICD-10-CM | POA: Diagnosis not present

## 2015-05-06 DIAGNOSIS — N186 End stage renal disease: Secondary | ICD-10-CM | POA: Diagnosis not present

## 2015-05-06 DIAGNOSIS — N2581 Secondary hyperparathyroidism of renal origin: Secondary | ICD-10-CM | POA: Diagnosis not present

## 2015-05-06 DIAGNOSIS — E1129 Type 2 diabetes mellitus with other diabetic kidney complication: Secondary | ICD-10-CM | POA: Diagnosis not present

## 2015-05-06 DIAGNOSIS — A498 Other bacterial infections of unspecified site: Secondary | ICD-10-CM | POA: Diagnosis not present

## 2015-05-09 ENCOUNTER — Ambulatory Visit: Payer: Medicare Other | Admitting: Cardiovascular Disease

## 2015-05-09 DIAGNOSIS — E1129 Type 2 diabetes mellitus with other diabetic kidney complication: Secondary | ICD-10-CM | POA: Diagnosis not present

## 2015-05-09 DIAGNOSIS — N2581 Secondary hyperparathyroidism of renal origin: Secondary | ICD-10-CM | POA: Diagnosis not present

## 2015-05-09 DIAGNOSIS — A498 Other bacterial infections of unspecified site: Secondary | ICD-10-CM | POA: Diagnosis not present

## 2015-05-09 DIAGNOSIS — D509 Iron deficiency anemia, unspecified: Secondary | ICD-10-CM | POA: Diagnosis not present

## 2015-05-09 DIAGNOSIS — N186 End stage renal disease: Secondary | ICD-10-CM | POA: Diagnosis not present

## 2015-05-09 DIAGNOSIS — R509 Fever, unspecified: Secondary | ICD-10-CM | POA: Diagnosis not present

## 2015-05-11 DIAGNOSIS — A498 Other bacterial infections of unspecified site: Secondary | ICD-10-CM | POA: Diagnosis not present

## 2015-05-11 DIAGNOSIS — R509 Fever, unspecified: Secondary | ICD-10-CM | POA: Diagnosis not present

## 2015-05-11 DIAGNOSIS — N186 End stage renal disease: Secondary | ICD-10-CM | POA: Diagnosis not present

## 2015-05-11 DIAGNOSIS — E1129 Type 2 diabetes mellitus with other diabetic kidney complication: Secondary | ICD-10-CM | POA: Diagnosis not present

## 2015-05-11 DIAGNOSIS — N2581 Secondary hyperparathyroidism of renal origin: Secondary | ICD-10-CM | POA: Diagnosis not present

## 2015-05-11 DIAGNOSIS — D509 Iron deficiency anemia, unspecified: Secondary | ICD-10-CM | POA: Diagnosis not present

## 2015-05-13 DIAGNOSIS — N186 End stage renal disease: Secondary | ICD-10-CM | POA: Diagnosis not present

## 2015-05-13 DIAGNOSIS — R509 Fever, unspecified: Secondary | ICD-10-CM | POA: Diagnosis not present

## 2015-05-13 DIAGNOSIS — A498 Other bacterial infections of unspecified site: Secondary | ICD-10-CM | POA: Diagnosis not present

## 2015-05-13 DIAGNOSIS — E1129 Type 2 diabetes mellitus with other diabetic kidney complication: Secondary | ICD-10-CM | POA: Diagnosis not present

## 2015-05-13 DIAGNOSIS — N2581 Secondary hyperparathyroidism of renal origin: Secondary | ICD-10-CM | POA: Diagnosis not present

## 2015-05-13 DIAGNOSIS — D509 Iron deficiency anemia, unspecified: Secondary | ICD-10-CM | POA: Diagnosis not present

## 2015-05-16 DIAGNOSIS — N186 End stage renal disease: Secondary | ICD-10-CM | POA: Diagnosis not present

## 2015-05-16 DIAGNOSIS — A498 Other bacterial infections of unspecified site: Secondary | ICD-10-CM | POA: Diagnosis not present

## 2015-05-16 DIAGNOSIS — R509 Fever, unspecified: Secondary | ICD-10-CM | POA: Diagnosis not present

## 2015-05-16 DIAGNOSIS — E1129 Type 2 diabetes mellitus with other diabetic kidney complication: Secondary | ICD-10-CM | POA: Diagnosis not present

## 2015-05-16 DIAGNOSIS — N2581 Secondary hyperparathyroidism of renal origin: Secondary | ICD-10-CM | POA: Diagnosis not present

## 2015-05-16 DIAGNOSIS — D509 Iron deficiency anemia, unspecified: Secondary | ICD-10-CM | POA: Diagnosis not present

## 2015-05-18 DIAGNOSIS — N2581 Secondary hyperparathyroidism of renal origin: Secondary | ICD-10-CM | POA: Diagnosis not present

## 2015-05-18 DIAGNOSIS — A498 Other bacterial infections of unspecified site: Secondary | ICD-10-CM | POA: Diagnosis not present

## 2015-05-18 DIAGNOSIS — N186 End stage renal disease: Secondary | ICD-10-CM | POA: Diagnosis not present

## 2015-05-18 DIAGNOSIS — Z5181 Encounter for therapeutic drug level monitoring: Secondary | ICD-10-CM | POA: Diagnosis not present

## 2015-05-18 DIAGNOSIS — R509 Fever, unspecified: Secondary | ICD-10-CM | POA: Diagnosis not present

## 2015-05-18 DIAGNOSIS — D509 Iron deficiency anemia, unspecified: Secondary | ICD-10-CM | POA: Diagnosis not present

## 2015-05-18 DIAGNOSIS — E1129 Type 2 diabetes mellitus with other diabetic kidney complication: Secondary | ICD-10-CM | POA: Diagnosis not present

## 2015-05-20 DIAGNOSIS — D509 Iron deficiency anemia, unspecified: Secondary | ICD-10-CM | POA: Diagnosis not present

## 2015-05-20 DIAGNOSIS — N186 End stage renal disease: Secondary | ICD-10-CM | POA: Diagnosis not present

## 2015-05-20 DIAGNOSIS — R509 Fever, unspecified: Secondary | ICD-10-CM | POA: Diagnosis not present

## 2015-05-20 DIAGNOSIS — Z992 Dependence on renal dialysis: Secondary | ICD-10-CM | POA: Diagnosis not present

## 2015-05-20 DIAGNOSIS — A498 Other bacterial infections of unspecified site: Secondary | ICD-10-CM | POA: Diagnosis not present

## 2015-05-20 DIAGNOSIS — E1129 Type 2 diabetes mellitus with other diabetic kidney complication: Secondary | ICD-10-CM | POA: Diagnosis not present

## 2015-05-20 DIAGNOSIS — E1122 Type 2 diabetes mellitus with diabetic chronic kidney disease: Secondary | ICD-10-CM | POA: Diagnosis not present

## 2015-05-20 DIAGNOSIS — N2581 Secondary hyperparathyroidism of renal origin: Secondary | ICD-10-CM | POA: Diagnosis not present

## 2015-05-23 DIAGNOSIS — D631 Anemia in chronic kidney disease: Secondary | ICD-10-CM | POA: Diagnosis not present

## 2015-05-23 DIAGNOSIS — A498 Other bacterial infections of unspecified site: Secondary | ICD-10-CM | POA: Diagnosis not present

## 2015-05-23 DIAGNOSIS — N2581 Secondary hyperparathyroidism of renal origin: Secondary | ICD-10-CM | POA: Diagnosis not present

## 2015-05-23 DIAGNOSIS — D509 Iron deficiency anemia, unspecified: Secondary | ICD-10-CM | POA: Diagnosis not present

## 2015-05-23 DIAGNOSIS — D508 Other iron deficiency anemias: Secondary | ICD-10-CM | POA: Diagnosis not present

## 2015-05-23 DIAGNOSIS — E1129 Type 2 diabetes mellitus with other diabetic kidney complication: Secondary | ICD-10-CM | POA: Diagnosis not present

## 2015-05-23 DIAGNOSIS — N186 End stage renal disease: Secondary | ICD-10-CM | POA: Diagnosis not present

## 2015-05-25 DIAGNOSIS — E1129 Type 2 diabetes mellitus with other diabetic kidney complication: Secondary | ICD-10-CM | POA: Diagnosis not present

## 2015-05-25 DIAGNOSIS — N2581 Secondary hyperparathyroidism of renal origin: Secondary | ICD-10-CM | POA: Diagnosis not present

## 2015-05-25 DIAGNOSIS — Z5181 Encounter for therapeutic drug level monitoring: Secondary | ICD-10-CM | POA: Diagnosis not present

## 2015-05-25 DIAGNOSIS — D508 Other iron deficiency anemias: Secondary | ICD-10-CM | POA: Diagnosis not present

## 2015-05-25 DIAGNOSIS — N186 End stage renal disease: Secondary | ICD-10-CM | POA: Diagnosis not present

## 2015-05-25 DIAGNOSIS — D509 Iron deficiency anemia, unspecified: Secondary | ICD-10-CM | POA: Diagnosis not present

## 2015-05-25 DIAGNOSIS — A498 Other bacterial infections of unspecified site: Secondary | ICD-10-CM | POA: Diagnosis not present

## 2015-05-27 DIAGNOSIS — N2581 Secondary hyperparathyroidism of renal origin: Secondary | ICD-10-CM | POA: Diagnosis not present

## 2015-05-27 DIAGNOSIS — D509 Iron deficiency anemia, unspecified: Secondary | ICD-10-CM | POA: Diagnosis not present

## 2015-05-27 DIAGNOSIS — E1129 Type 2 diabetes mellitus with other diabetic kidney complication: Secondary | ICD-10-CM | POA: Diagnosis not present

## 2015-05-27 DIAGNOSIS — A498 Other bacterial infections of unspecified site: Secondary | ICD-10-CM | POA: Diagnosis not present

## 2015-05-27 DIAGNOSIS — D508 Other iron deficiency anemias: Secondary | ICD-10-CM | POA: Diagnosis not present

## 2015-05-27 DIAGNOSIS — N186 End stage renal disease: Secondary | ICD-10-CM | POA: Diagnosis not present

## 2015-05-30 ENCOUNTER — Encounter: Payer: Self-pay | Admitting: *Deleted

## 2015-05-30 DIAGNOSIS — D509 Iron deficiency anemia, unspecified: Secondary | ICD-10-CM | POA: Diagnosis not present

## 2015-05-30 DIAGNOSIS — E1129 Type 2 diabetes mellitus with other diabetic kidney complication: Secondary | ICD-10-CM | POA: Diagnosis not present

## 2015-05-30 DIAGNOSIS — A498 Other bacterial infections of unspecified site: Secondary | ICD-10-CM | POA: Diagnosis not present

## 2015-05-30 DIAGNOSIS — N186 End stage renal disease: Secondary | ICD-10-CM | POA: Diagnosis not present

## 2015-05-30 DIAGNOSIS — N2581 Secondary hyperparathyroidism of renal origin: Secondary | ICD-10-CM | POA: Diagnosis not present

## 2015-05-30 DIAGNOSIS — D508 Other iron deficiency anemias: Secondary | ICD-10-CM | POA: Diagnosis not present

## 2015-05-31 ENCOUNTER — Ambulatory Visit: Payer: Medicare Other | Admitting: Anesthesiology

## 2015-05-31 ENCOUNTER — Encounter
Admission: RE | Disposition: A | Discharge status: Home / Self Care | Payer: Self-pay | Source: Ambulatory Visit | Attending: Gastroenterology

## 2015-05-31 ENCOUNTER — Encounter: Payer: Self-pay | Admitting: Anesthesiology

## 2015-05-31 ENCOUNTER — Ambulatory Visit
Admission: RE | Admit: 2015-05-31 | Discharge: 2015-05-31 | Disposition: A | Discharge status: Home / Self Care | Payer: Medicare Other | Source: Ambulatory Visit | Attending: Gastroenterology | Admitting: Gastroenterology

## 2015-05-31 DIAGNOSIS — K2950 Unspecified chronic gastritis without bleeding: Secondary | ICD-10-CM | POA: Insufficient documentation

## 2015-05-31 DIAGNOSIS — Z86010 Personal history of colonic polyps: Secondary | ICD-10-CM | POA: Diagnosis not present

## 2015-05-31 DIAGNOSIS — Z992 Dependence on renal dialysis: Secondary | ICD-10-CM | POA: Insufficient documentation

## 2015-05-31 DIAGNOSIS — I5022 Chronic systolic (congestive) heart failure: Secondary | ICD-10-CM | POA: Diagnosis not present

## 2015-05-31 DIAGNOSIS — K2900 Acute gastritis without bleeding: Secondary | ICD-10-CM | POA: Diagnosis not present

## 2015-05-31 DIAGNOSIS — Z79899 Other long term (current) drug therapy: Secondary | ICD-10-CM | POA: Diagnosis not present

## 2015-05-31 DIAGNOSIS — I2510 Atherosclerotic heart disease of native coronary artery without angina pectoris: Secondary | ICD-10-CM | POA: Diagnosis not present

## 2015-05-31 DIAGNOSIS — Z7982 Long term (current) use of aspirin: Secondary | ICD-10-CM | POA: Insufficient documentation

## 2015-05-31 DIAGNOSIS — K319 Disease of stomach and duodenum, unspecified: Secondary | ICD-10-CM | POA: Insufficient documentation

## 2015-05-31 DIAGNOSIS — K209 Esophagitis, unspecified: Secondary | ICD-10-CM | POA: Diagnosis not present

## 2015-05-31 DIAGNOSIS — Z8673 Personal history of transient ischemic attack (TIA), and cerebral infarction without residual deficits: Secondary | ICD-10-CM | POA: Diagnosis not present

## 2015-05-31 DIAGNOSIS — D649 Anemia, unspecified: Secondary | ICD-10-CM | POA: Insufficient documentation

## 2015-05-31 DIAGNOSIS — E1122 Type 2 diabetes mellitus with diabetic chronic kidney disease: Secondary | ICD-10-CM | POA: Insufficient documentation

## 2015-05-31 DIAGNOSIS — N186 End stage renal disease: Secondary | ICD-10-CM | POA: Diagnosis not present

## 2015-05-31 DIAGNOSIS — K3189 Other diseases of stomach and duodenum: Secondary | ICD-10-CM | POA: Diagnosis not present

## 2015-05-31 DIAGNOSIS — K921 Melena: Secondary | ICD-10-CM | POA: Insufficient documentation

## 2015-05-31 DIAGNOSIS — K2210 Ulcer of esophagus without bleeding: Secondary | ICD-10-CM | POA: Diagnosis not present

## 2015-05-31 HISTORY — PX: COLONOSCOPY WITH PROPOFOL: SHX5780

## 2015-05-31 HISTORY — PX: ESOPHAGOGASTRODUODENOSCOPY (EGD) WITH PROPOFOL: SHX5813

## 2015-05-31 LAB — CBC WITH DIFFERENTIAL/PLATELET
BASOS PCT: 2 %
Basophils Absolute: 0.1 10*3/uL (ref 0–0.1)
Eosinophils Absolute: 0.1 10*3/uL (ref 0–0.7)
Eosinophils Relative: 2 %
HEMATOCRIT: 39.7 % — AB (ref 40.0–52.0)
Hemoglobin: 12.3 g/dL — ABNORMAL LOW (ref 13.0–18.0)
Lymphocytes Relative: 24 %
Lymphs Abs: 0.7 10*3/uL — ABNORMAL LOW (ref 1.0–3.6)
MCH: 27.3 pg (ref 26.0–34.0)
MCHC: 31.1 g/dL — AB (ref 32.0–36.0)
MCV: 87.8 fL (ref 80.0–100.0)
MONO ABS: 0.3 10*3/uL (ref 0.2–1.0)
MONOS PCT: 10 %
NEUTROS ABS: 1.9 10*3/uL (ref 1.4–6.5)
Neutrophils Relative %: 62 %
Platelets: 129 10*3/uL — ABNORMAL LOW (ref 150–440)
RBC: 4.52 MIL/uL (ref 4.40–5.90)
RDW: 17.9 % — AB (ref 11.5–14.5)
WBC: 3.1 10*3/uL — ABNORMAL LOW (ref 3.8–10.6)

## 2015-05-31 LAB — CBC
HCT: 39.5 % — ABNORMAL LOW (ref 40.0–52.0)
HEMOGLOBIN: 12.4 g/dL — AB (ref 13.0–18.0)
MCH: 27.6 pg (ref 26.0–34.0)
MCHC: 31.3 g/dL — AB (ref 32.0–36.0)
MCV: 88.4 fL (ref 80.0–100.0)
PLATELETS: 127 10*3/uL — AB (ref 150–440)
RBC: 4.47 MIL/uL (ref 4.40–5.90)
RDW: 17.7 % — AB (ref 11.5–14.5)
WBC: 3.1 10*3/uL — ABNORMAL LOW (ref 3.8–10.6)

## 2015-05-31 LAB — PROTIME-INR
INR: 1.17
PROTHROMBIN TIME: 15.1 s — AB (ref 11.4–15.0)

## 2015-05-31 SURGERY — COLONOSCOPY WITH PROPOFOL
Anesthesia: General

## 2015-05-31 MED ORDER — SODIUM CHLORIDE 0.9 % IV SOLN
INTRAVENOUS | Status: DC
  Administered 2015-05-31 (×3): via INTRAVENOUS

## 2015-05-31 MED ORDER — PROPOFOL 500 MG/50ML IV EMUL
INTRAVENOUS | Status: DC | PRN
Start: 2015-05-31 — End: 2015-05-31
  Administered 2015-05-31: 75 ug/kg/min via INTRAVENOUS

## 2015-05-31 MED ORDER — PROPOFOL 10 MG/ML IV BOLUS
INTRAVENOUS | Status: DC | PRN
  Administered 2015-05-31: 50 mg via INTRAVENOUS

## 2015-05-31 MED ORDER — FENTANYL CITRATE (PF) 100 MCG/2ML IJ SOLN
INTRAMUSCULAR | Status: DC | PRN
  Administered 2015-05-31: 50 ug via INTRAVENOUS

## 2015-05-31 MED ORDER — MIDAZOLAM HCL 5 MG/5ML IJ SOLN
INTRAMUSCULAR | Status: DC | PRN
  Administered 2015-05-31: 1 mg via INTRAVENOUS

## 2015-05-31 MED ORDER — LIDOCAINE HCL (CARDIAC) 20 MG/ML IV SOLN
INTRAVENOUS | Status: DC | PRN
  Administered 2015-05-31: 30 mg via INTRAVENOUS

## 2015-05-31 NOTE — Anesthesia Postprocedure Evaluation (Signed)
  Anesthesia Post-op Note  Patient: Luis Walter  Procedure(s) Performed: Procedure(s): COLONOSCOPY WITH PROPOFOL (N/A) ESOPHAGOGASTRODUODENOSCOPY (EGD) WITH PROPOFOL (N/A)  Anesthesia type:General  Patient location: PACU  Post pain: Pain level controlled  Post assessment: Post-op Vital signs reviewed, Patient's Cardiovascular Status Stable, Respiratory Function Stable, Patent Airway and No signs of Nausea or vomiting  Post vital signs: Reviewed and stable  Last Vitals:  Filed Vitals:   05/31/15 1250  BP: 122/63  Pulse: 76  Temp:   Resp: 23    Level of consciousness: awake, alert  and patient cooperative  Complications: No apparent anesthesia complications

## 2015-05-31 NOTE — Transfer of Care (Signed)
Immediate Anesthesia Transfer of Care Note  Patient: Luis Walter  Procedure(s) Performed: Procedure(s): COLONOSCOPY WITH PROPOFOL (N/A) ESOPHAGOGASTRODUODENOSCOPY (EGD) WITH PROPOFOL (N/A)  Patient Location: PACU and Short Stay  Anesthesia Type:General  Level of Consciousness: sedated and patient cooperative  Airway & Oxygen Therapy: Patient Spontanous Breathing and Patient connected to nasal cannula oxygen  Post-op Assessment: Report given to RN and Post -op Vital signs reviewed and stable  Post vital signs: Reviewed and stable  Last Vitals:  Filed Vitals:   05/31/15 1220  BP: 102/56  Pulse: 68  Temp: 36.6 C  Resp: 26    Complications: No apparent anesthesia complications

## 2015-05-31 NOTE — Op Note (Signed)
[facility] Patient [name] Procedure Date: 05/31/2015 11:39 AM MRN: [id number] Account #: [id number] Date of Birth: 28-Sep-1950 Admit Type: Outpatient Age: 64 Room: [facility] ENDO ROOM 3 Gender: Male Note Status: Finalized Procedure:         Upper GI endoscopy Indications:       Anemia, Melena Providers:         Lollie Sails, MD Referring MD:      [name] (Referring MD) Medicines:         Monitored Anesthesia Care Complications:     No immediate complications. Procedure:         Pre-Anesthesia Assessment:                    - ASA Grade Assessment: III - A patient with severe                     systemic disease.                    After obtaining informed consent, the endoscope was passed                     under direct vision. Throughout the procedure, the                     patient's blood pressure, pulse, and oxygen saturations                     were monitored continuously. The Olympus GIF-160 endoscope                     (S#. [id number]) was introduced through the mouth, and                     advanced to the third part of duodenum. The upper GI                     endoscopy was accomplished without difficulty. The patient                     tolerated the procedure well. Findings:      The Z-line was irregular. Biopsies were taken with a cold forceps for       histology.      The exam of the esophagus was otherwise normal.      Patchy mild inflammation characterized by congestion (edema), erosions,       erythema and shallow ulcerations was found in the gastric body, at the       incisura and in the gastric antrum. Biopsies were taken with a cold       forceps for Helicobacter pylori testing. Biopsies were taken with a cold       forceps for histology.      The cardia and gastric fundus were normal on retroflexion.      The examined duodenum was normal. Impression:        - Z-line irregular. Biopsied.      - Erosive gastritis. Biopsied.                    - Normal examined duodenum. Recommendation:    - Await pathology results.                    - Use Protonix (pantoprazole) 40 mg PO daily daily.                    -  Use sucralfate tablets 1 gram PO QID for 1 month. Procedure Code(s): --- Professional ---                    [phone], Esophagogastroduodenoscopy, flexible, transoral;                     with biopsy, single or multiple Diagnosis Code(s): --- Professional ---                    530.89, Other specified disorders of esophagus                    535.40, Other specified gastritis, without mention of                     hemorrhage                    285.9, Anemia, unspecified                    578.1, Blood in stool CPT copyright 2014 American Medical Association. All rights reserved. The codes documented in this report are preliminary and upon coder review may  be revised to meet current compliance requirements. Lollie Sails, MD 05/31/2015 11:55:28 AM This report has been signed electronically. Number of Addenda: 0 Note Initiated On: 05/31/2015 11:39 AM      [facility]

## 2015-05-31 NOTE — Anesthesia Preprocedure Evaluation (Addendum)
Anesthesia Evaluation  Patient identified by MRN, date of birth, ID band Patient awake    Reviewed: Allergy & Precautions, H&P , NPO status , Patient's Chart, lab work & pertinent test results  History of Anesthesia Complications Negative for: history of anesthetic complications  Airway Mallampati: III  TM Distance: >3 FB Neck ROM: limited    Dental  (+) Poor Dentition, Missing, Edentulous Lower, Edentulous Upper   Pulmonary pneumonia, resolved, former smoker,    Pulmonary exam normal breath sounds clear to auscultation       Cardiovascular Exercise Tolerance: Poor hypertension, + CAD and +CHF  Normal cardiovascular exam Rhythm:regular Rate:Normal     Neuro/Psych  Headaches, CVA negative psych ROS   GI/Hepatic negative GI ROS, Neg liver ROS,   Endo/Other  diabetes, Type 2  Renal/GU DialysisRenal disease  negative genitourinary   Musculoskeletal   Abdominal   Peds  Hematology negative hematology ROS (+)   Anesthesia Other Findings Past Medical History:   ESRD (end stage renal disease) ([place])                           Comment:a. on HD M,W,F; b. previously on transplant               list at [facility] - removed 2016   DM2 (diabetes mellitus, type 2) ([place])                        CAD (coronary artery disease)                                  Comment:a. cardiac cath 2014: LM nl, mLAD 30%, dLAD               30%, ostLCx 50%, mid ramus 20% OM3 80% pRCA               20%, mRCA 20%, PDA 100% w/ left to right               collats, medically managed    Stroke (HCC)                                                 Chronic systolic CHF (congestive heart failure*                Comment:a. echo 12/2014: EF 35-40%, mod diffuse HK, RWMA              cannot be excluded, LV diastolic fxn parameters              nl, mild MR, moderately dilated LA, mildly               dilated RV internal cavity size, mild TR, PASP               nl  Gallstones                                                   Dialysis patient ([place])  BMI    Body Mass Index   28.13 kg/m 2      Reproductive/Obstetrics negative OB ROS                            Anesthesia Physical Anesthesia Plan  ASA: III  Anesthesia Plan: General   Post-op Pain Management:    Induction:   Airway Management Planned:   Additional Equipment:   Intra-op Plan:   Post-operative Plan:   Informed Consent: I have reviewed the patients History and Physical, chart, labs and discussed the procedure including the risks, benefits and alternatives for the proposed anesthesia with the patient or authorized representative who has indicated his/her understanding and acceptance.   Dental Advisory Given  Plan Discussed with: Anesthesiologist, CRNA and Surgeon  Anesthesia Plan Comments:         Anesthesia Quick Evaluation

## 2015-05-31 NOTE — Op Note (Signed)
[facility] Gastroenterology Patient [name] Procedure Date: 05/31/2015 11:36 AM MRN: [id number] Account #: [id number] Date of Birth: 1951/04/28 Admit Type: Outpatient Age: 64 Room: [facility] ENDO ROOM 3 Gender: Male Note Status: Finalized Procedure:         Colonoscopy Indications:       Anemia, Personal history of colonic polyps Providers:         Lollie Sails, MD Referring MD:      [name] (Referring MD) Medicines:         Monitored Anesthesia Care Complications:     No immediate complications.multiple mucosally limited                     cracks noted in the distal rectum from the scope coiling,                     Several areas of mucosal irritation in the descenfing and                     sigmoid consistant with mucosal fragility and scope                     passage. not other defects noted. Procedure:         Pre-Anesthesia Assessment:                    - ASA Grade Assessment: III - A patient with severe                     systemic disease.                    After obtaining informed consent, the colonoscope was                     passed under direct vision. Throughout the procedure, the                     patient's blood pressure, pulse, and oxygen saturations                     were monitored continuously. The Colonoscope was                     introduced through the anus and advanced to the the cecum,                     identified by appendiceal orifice and ileocecal valve. The                     quality of the bowel preparation was fair. Findings:      The colon (entire examined portion) appeared normal.      The retroflexed view of the distal rectum and anal verge was normal and       showed no anal or rectal abnormalities.      The digital rectal exam was normal. Impression:        - The entire examined colon is normal.                    - The distal rectum and anal verge are normal on                     retroflexion  view.                    -  No specimens collected. Recommendation:    - Repeat colonoscopy in 5 years for screening purposes. Procedure Code(s): --- Professional ---                    [phone], Colonoscopy, flexible; diagnostic, including                     collection of specimen(s) by brushing or washing, when                     performed (separate procedure) Diagnosis Code(s): --- Professional ---                    285.9, Anemia, unspecified                    V12.72, Personal history of colonic polyps CPT copyright 2014 American Medical Association. All rights reserved. The codes documented in this report are preliminary and upon coder review may  be revised to meet current compliance requirements. Lollie Sails, MD 05/31/2015 12:28:57 PM This report has been signed electronically. Number of Addenda: 0 Note Initiated On: 05/31/2015 11:36 AM Scope Withdrawal Time: 0 hours 12 minutes 6 seconds  Total Procedure Duration: 0 hours 21 minutes 37 seconds       [facility]

## 2015-05-31 NOTE — H&P (Signed)
Outpatient short stay form Pre-procedure 05/31/2015 11:01 AM Lollie Sails MD  Primary Physician: Fredia Sorrow NP  Reason for visit:  EGD and colonoscopy  History of present illness:  Patient is a 64 year old male presenting with meals of seeing some black stools about 10 to days to 2 weeks ago. Is no nausea or vomiting. He also has a history of anemia however his medical history is significant for end-stage renal disease on hemodialysis, congestive heart failure with coronary artery disease, history of old CVA.  He tolerated his prep well. He takes an 81 mg aspirin but no other anticoagulates medications.    Current facility-administered medications:  .  0.9 %  sodium chloride infusion, , Intravenous, Continuous, Lollie Sails, MD  Prescriptions prior to admission  Medication Sig Dispense Refill Last Dose  . aspirin EC 81 MG tablet Take 1 tablet (81 mg total) by mouth daily. 90 tablet 3 Taking  . atorvastatin (LIPITOR) 10 MG tablet Take 1 tablet (10 mg total) by mouth daily. 90 tablet 3 Taking  . benzonatate (TESSALON) 100 MG capsule Take 1 capsule (100 mg total) by mouth 3 (three) times daily as needed for cough. 30 capsule 3   . hydrALAZINE (APRESOLINE) 10 MG tablet Take 1 tablet (10 mg total) by mouth 3 (three) times daily. 90 tablet 6 Taking  . isosorbide mononitrate (IMDUR) 30 MG 24 hr tablet Take 0.5 tablets (15 mg total) by mouth daily. 30 tablet 3 Taking  . loratadine (CLARITIN) 10 MG tablet Take 1 tablet (10 mg total) by mouth daily. 30 tablet 11   . metoprolol tartrate (LOPRESSOR) 25 MG tablet Take 1 tablet (25 mg total) by mouth 2 (two) times daily. 60 tablet 0 Taking  . Misc. Devices (CANE) MISC 1 each by Does not apply route daily. Please get 3 prong or wide based cane for ambulation. 1 each 0 Taking  . nortriptyline (PAMELOR) 10 MG capsule Take 1 capsule at night for one week, then increase to 2 capsules at night and continue this dose.   Taking  . sevelamer carbonate  (RENVELA) 800 MG tablet Take 3,200 mg by mouth 3 (three) times daily with meals.   Taking     No Known Allergies   Past Medical History  Diagnosis Date  . ESRD (end stage renal disease) ([place])     a. on HD M,W,F; b. previously on transplant list at [facility] - removed 2016  . DM2 (diabetes mellitus, type 2) ([place])   . CAD (coronary artery disease)     a. cardiac cath 2014: LM nl, mLAD 30%, dLAD 30%, ostLCx 50%, mid ramus 20% OM3 80% pRCA 20%, mRCA 20%, PDA 100% w/ left to right collats, medically managed   . Stroke ([place])   . Chronic systolic CHF (congestive heart failure) ([place])     a. echo 12/2014: EF 35-40%, mod diffuse HK, RWMA cannot be excluded, LV diastolic fxn parameters nl, mild MR, moderately dilated LA, mildly dilated RV internal cavity size, mild TR, PASP nl  . Gallstones   . Dialysis patient [facility])     Review of systems:      Physical Exam    Heart and lungs: Regular rate and rhythm, lungs are bilaterally clear    HEENT: Normocephalic atraumatic eyes are anicteric    Other:     Pertinant exam for procedure: Soft nontender nondistended bowel sounds positive normoactive    Planned proceedures: EGD and colonoscopy. I have discussed the risks benefits and complications of procedures  to include not limited to bleeding, infection, perforation and the risk of sedation and the patient wishes to proceed.    Lollie Sails, MD Gastroenterology 05/31/2015  11:01 AM

## 2015-06-01 DIAGNOSIS — D508 Other iron deficiency anemias: Secondary | ICD-10-CM | POA: Diagnosis not present

## 2015-06-01 DIAGNOSIS — N2581 Secondary hyperparathyroidism of renal origin: Secondary | ICD-10-CM | POA: Diagnosis not present

## 2015-06-01 DIAGNOSIS — A498 Other bacterial infections of unspecified site: Secondary | ICD-10-CM | POA: Diagnosis not present

## 2015-06-01 DIAGNOSIS — N186 End stage renal disease: Secondary | ICD-10-CM | POA: Diagnosis not present

## 2015-06-01 DIAGNOSIS — E1129 Type 2 diabetes mellitus with other diabetic kidney complication: Secondary | ICD-10-CM | POA: Diagnosis not present

## 2015-06-01 DIAGNOSIS — D509 Iron deficiency anemia, unspecified: Secondary | ICD-10-CM | POA: Diagnosis not present

## 2015-06-01 LAB — SURGICAL PATHOLOGY

## 2015-06-03 DIAGNOSIS — D509 Iron deficiency anemia, unspecified: Secondary | ICD-10-CM | POA: Diagnosis not present

## 2015-06-03 DIAGNOSIS — E1129 Type 2 diabetes mellitus with other diabetic kidney complication: Secondary | ICD-10-CM | POA: Diagnosis not present

## 2015-06-03 DIAGNOSIS — N2581 Secondary hyperparathyroidism of renal origin: Secondary | ICD-10-CM | POA: Diagnosis not present

## 2015-06-03 DIAGNOSIS — D508 Other iron deficiency anemias: Secondary | ICD-10-CM | POA: Diagnosis not present

## 2015-06-03 DIAGNOSIS — N186 End stage renal disease: Secondary | ICD-10-CM | POA: Diagnosis not present

## 2015-06-03 DIAGNOSIS — A498 Other bacterial infections of unspecified site: Secondary | ICD-10-CM | POA: Diagnosis not present

## 2015-06-06 DIAGNOSIS — A498 Other bacterial infections of unspecified site: Secondary | ICD-10-CM | POA: Diagnosis not present

## 2015-06-06 DIAGNOSIS — E1129 Type 2 diabetes mellitus with other diabetic kidney complication: Secondary | ICD-10-CM | POA: Diagnosis not present

## 2015-06-06 DIAGNOSIS — D508 Other iron deficiency anemias: Secondary | ICD-10-CM | POA: Diagnosis not present

## 2015-06-06 DIAGNOSIS — N186 End stage renal disease: Secondary | ICD-10-CM | POA: Diagnosis not present

## 2015-06-06 DIAGNOSIS — N2581 Secondary hyperparathyroidism of renal origin: Secondary | ICD-10-CM | POA: Diagnosis not present

## 2015-06-06 DIAGNOSIS — D509 Iron deficiency anemia, unspecified: Secondary | ICD-10-CM | POA: Diagnosis not present

## 2015-06-07 ENCOUNTER — Telehealth: Payer: Self-pay

## 2015-06-07 NOTE — Telephone Encounter (Signed)
Patient found an appointment sooner and I did explain how fast and hard Luis Walter worked to work him in but wife stated she was not sure why it took [place] so long to do this between 12-3 PM. When I called her with the date and time she said she called to cancel the one in January and was upset that she may have to call [facility] now. She seemed very irritated that we did not call her with a new sooner skin appt and I did tell her that referrals often take weeks even months and that we do also have clinic going on while doing this and that we did work hard to help. She was still very unpleased as she hung up.

## 2015-06-07 NOTE — Telephone Encounter (Signed)
Wife called stated [place] can not see him until January. They want to see how to be seen before then and they do not have contact info for the skin center that called. Please call wife [phone].

## 2015-06-07 NOTE — Telephone Encounter (Signed)
[facility] [place] [facility] and scheduled patient for Tues, Nov 1 @ 12:45. Patient needs to arrive 30 min prior for registration and bring ins card. Office contact # is [phone] if they need to reschedule. Patient needs to call and cancel appointment with [facility].

## 2015-06-08 ENCOUNTER — Encounter: Payer: Self-pay | Admitting: Gastroenterology

## 2015-06-08 DIAGNOSIS — A498 Other bacterial infections of unspecified site: Secondary | ICD-10-CM | POA: Diagnosis not present

## 2015-06-08 DIAGNOSIS — D508 Other iron deficiency anemias: Secondary | ICD-10-CM | POA: Diagnosis not present

## 2015-06-08 DIAGNOSIS — E1129 Type 2 diabetes mellitus with other diabetic kidney complication: Secondary | ICD-10-CM | POA: Diagnosis not present

## 2015-06-08 DIAGNOSIS — D509 Iron deficiency anemia, unspecified: Secondary | ICD-10-CM | POA: Diagnosis not present

## 2015-06-08 DIAGNOSIS — N2581 Secondary hyperparathyroidism of renal origin: Secondary | ICD-10-CM | POA: Diagnosis not present

## 2015-06-08 DIAGNOSIS — N186 End stage renal disease: Secondary | ICD-10-CM | POA: Diagnosis not present

## 2015-06-09 DIAGNOSIS — L720 Epidermal cyst: Secondary | ICD-10-CM | POA: Diagnosis not present

## 2015-06-10 DIAGNOSIS — N2581 Secondary hyperparathyroidism of renal origin: Secondary | ICD-10-CM | POA: Diagnosis not present

## 2015-06-10 DIAGNOSIS — D509 Iron deficiency anemia, unspecified: Secondary | ICD-10-CM | POA: Diagnosis not present

## 2015-06-10 DIAGNOSIS — D508 Other iron deficiency anemias: Secondary | ICD-10-CM | POA: Diagnosis not present

## 2015-06-10 DIAGNOSIS — A498 Other bacterial infections of unspecified site: Secondary | ICD-10-CM | POA: Diagnosis not present

## 2015-06-10 DIAGNOSIS — N186 End stage renal disease: Secondary | ICD-10-CM | POA: Diagnosis not present

## 2015-06-10 DIAGNOSIS — E1129 Type 2 diabetes mellitus with other diabetic kidney complication: Secondary | ICD-10-CM | POA: Diagnosis not present

## 2015-06-13 DIAGNOSIS — N2581 Secondary hyperparathyroidism of renal origin: Secondary | ICD-10-CM | POA: Diagnosis not present

## 2015-06-13 DIAGNOSIS — D509 Iron deficiency anemia, unspecified: Secondary | ICD-10-CM | POA: Diagnosis not present

## 2015-06-13 DIAGNOSIS — E1129 Type 2 diabetes mellitus with other diabetic kidney complication: Secondary | ICD-10-CM | POA: Diagnosis not present

## 2015-06-13 DIAGNOSIS — N186 End stage renal disease: Secondary | ICD-10-CM | POA: Diagnosis not present

## 2015-06-13 DIAGNOSIS — A498 Other bacterial infections of unspecified site: Secondary | ICD-10-CM | POA: Diagnosis not present

## 2015-06-13 DIAGNOSIS — D508 Other iron deficiency anemias: Secondary | ICD-10-CM | POA: Diagnosis not present

## 2015-06-14 DIAGNOSIS — D485 Neoplasm of uncertain behavior of skin: Secondary | ICD-10-CM | POA: Diagnosis not present

## 2015-06-14 DIAGNOSIS — L722 Steatocystoma multiplex: Secondary | ICD-10-CM | POA: Diagnosis not present

## 2015-06-15 DIAGNOSIS — E1129 Type 2 diabetes mellitus with other diabetic kidney complication: Secondary | ICD-10-CM | POA: Diagnosis not present

## 2015-06-15 DIAGNOSIS — A498 Other bacterial infections of unspecified site: Secondary | ICD-10-CM | POA: Diagnosis not present

## 2015-06-15 DIAGNOSIS — N2581 Secondary hyperparathyroidism of renal origin: Secondary | ICD-10-CM | POA: Diagnosis not present

## 2015-06-15 DIAGNOSIS — N186 End stage renal disease: Secondary | ICD-10-CM | POA: Diagnosis not present

## 2015-06-15 DIAGNOSIS — D509 Iron deficiency anemia, unspecified: Secondary | ICD-10-CM | POA: Diagnosis not present

## 2015-06-15 DIAGNOSIS — D508 Other iron deficiency anemias: Secondary | ICD-10-CM | POA: Diagnosis not present

## 2015-06-17 DIAGNOSIS — E1129 Type 2 diabetes mellitus with other diabetic kidney complication: Secondary | ICD-10-CM | POA: Diagnosis not present

## 2015-06-17 DIAGNOSIS — A498 Other bacterial infections of unspecified site: Secondary | ICD-10-CM | POA: Diagnosis not present

## 2015-06-17 DIAGNOSIS — N2581 Secondary hyperparathyroidism of renal origin: Secondary | ICD-10-CM | POA: Diagnosis not present

## 2015-06-17 DIAGNOSIS — D509 Iron deficiency anemia, unspecified: Secondary | ICD-10-CM | POA: Diagnosis not present

## 2015-06-17 DIAGNOSIS — D508 Other iron deficiency anemias: Secondary | ICD-10-CM | POA: Diagnosis not present

## 2015-06-17 DIAGNOSIS — N186 End stage renal disease: Secondary | ICD-10-CM | POA: Diagnosis not present

## 2015-06-20 DIAGNOSIS — D509 Iron deficiency anemia, unspecified: Secondary | ICD-10-CM | POA: Diagnosis not present

## 2015-06-20 DIAGNOSIS — E1122 Type 2 diabetes mellitus with diabetic chronic kidney disease: Secondary | ICD-10-CM | POA: Diagnosis not present

## 2015-06-20 DIAGNOSIS — N186 End stage renal disease: Secondary | ICD-10-CM | POA: Diagnosis not present

## 2015-06-20 DIAGNOSIS — D508 Other iron deficiency anemias: Secondary | ICD-10-CM | POA: Diagnosis not present

## 2015-06-20 DIAGNOSIS — A498 Other bacterial infections of unspecified site: Secondary | ICD-10-CM | POA: Diagnosis not present

## 2015-06-20 DIAGNOSIS — Z992 Dependence on renal dialysis: Secondary | ICD-10-CM | POA: Diagnosis not present

## 2015-06-20 DIAGNOSIS — N2581 Secondary hyperparathyroidism of renal origin: Secondary | ICD-10-CM | POA: Diagnosis not present

## 2015-06-20 DIAGNOSIS — E1129 Type 2 diabetes mellitus with other diabetic kidney complication: Secondary | ICD-10-CM | POA: Diagnosis not present

## 2015-06-21 DIAGNOSIS — L720 Epidermal cyst: Secondary | ICD-10-CM | POA: Diagnosis not present

## 2015-06-21 DIAGNOSIS — D485 Neoplasm of uncertain behavior of skin: Secondary | ICD-10-CM | POA: Diagnosis not present

## 2015-06-21 DIAGNOSIS — K2900 Acute gastritis without bleeding: Secondary | ICD-10-CM | POA: Diagnosis not present

## 2015-06-21 DIAGNOSIS — D649 Anemia, unspecified: Secondary | ICD-10-CM | POA: Diagnosis not present

## 2015-06-22 DIAGNOSIS — D631 Anemia in chronic kidney disease: Secondary | ICD-10-CM | POA: Diagnosis not present

## 2015-06-22 DIAGNOSIS — D508 Other iron deficiency anemias: Secondary | ICD-10-CM | POA: Diagnosis not present

## 2015-06-22 DIAGNOSIS — E1129 Type 2 diabetes mellitus with other diabetic kidney complication: Secondary | ICD-10-CM | POA: Diagnosis not present

## 2015-06-22 DIAGNOSIS — N186 End stage renal disease: Secondary | ICD-10-CM | POA: Diagnosis not present

## 2015-06-22 DIAGNOSIS — A498 Other bacterial infections of unspecified site: Secondary | ICD-10-CM | POA: Diagnosis not present

## 2015-06-22 DIAGNOSIS — N2581 Secondary hyperparathyroidism of renal origin: Secondary | ICD-10-CM | POA: Diagnosis not present

## 2015-06-24 DIAGNOSIS — D508 Other iron deficiency anemias: Secondary | ICD-10-CM | POA: Diagnosis not present

## 2015-06-24 DIAGNOSIS — N186 End stage renal disease: Secondary | ICD-10-CM | POA: Diagnosis not present

## 2015-06-24 DIAGNOSIS — D631 Anemia in chronic kidney disease: Secondary | ICD-10-CM | POA: Diagnosis not present

## 2015-06-24 DIAGNOSIS — A498 Other bacterial infections of unspecified site: Secondary | ICD-10-CM | POA: Diagnosis not present

## 2015-06-24 DIAGNOSIS — N2581 Secondary hyperparathyroidism of renal origin: Secondary | ICD-10-CM | POA: Diagnosis not present

## 2015-06-24 DIAGNOSIS — E1129 Type 2 diabetes mellitus with other diabetic kidney complication: Secondary | ICD-10-CM | POA: Diagnosis not present

## 2015-06-27 DIAGNOSIS — N2581 Secondary hyperparathyroidism of renal origin: Secondary | ICD-10-CM | POA: Diagnosis not present

## 2015-06-27 DIAGNOSIS — D508 Other iron deficiency anemias: Secondary | ICD-10-CM | POA: Diagnosis not present

## 2015-06-27 DIAGNOSIS — D631 Anemia in chronic kidney disease: Secondary | ICD-10-CM | POA: Diagnosis not present

## 2015-06-27 DIAGNOSIS — A498 Other bacterial infections of unspecified site: Secondary | ICD-10-CM | POA: Diagnosis not present

## 2015-06-27 DIAGNOSIS — N186 End stage renal disease: Secondary | ICD-10-CM | POA: Diagnosis not present

## 2015-06-27 DIAGNOSIS — E1129 Type 2 diabetes mellitus with other diabetic kidney complication: Secondary | ICD-10-CM | POA: Diagnosis not present

## 2015-06-29 DIAGNOSIS — N186 End stage renal disease: Secondary | ICD-10-CM | POA: Diagnosis not present

## 2015-06-29 DIAGNOSIS — N2581 Secondary hyperparathyroidism of renal origin: Secondary | ICD-10-CM | POA: Diagnosis not present

## 2015-06-29 DIAGNOSIS — D508 Other iron deficiency anemias: Secondary | ICD-10-CM | POA: Diagnosis not present

## 2015-06-29 DIAGNOSIS — D631 Anemia in chronic kidney disease: Secondary | ICD-10-CM | POA: Diagnosis not present

## 2015-06-29 DIAGNOSIS — A498 Other bacterial infections of unspecified site: Secondary | ICD-10-CM | POA: Diagnosis not present

## 2015-06-29 DIAGNOSIS — E1129 Type 2 diabetes mellitus with other diabetic kidney complication: Secondary | ICD-10-CM | POA: Diagnosis not present

## 2015-07-01 DIAGNOSIS — A498 Other bacterial infections of unspecified site: Secondary | ICD-10-CM | POA: Diagnosis not present

## 2015-07-01 DIAGNOSIS — E1129 Type 2 diabetes mellitus with other diabetic kidney complication: Secondary | ICD-10-CM | POA: Diagnosis not present

## 2015-07-01 DIAGNOSIS — N186 End stage renal disease: Secondary | ICD-10-CM | POA: Diagnosis not present

## 2015-07-01 DIAGNOSIS — N2581 Secondary hyperparathyroidism of renal origin: Secondary | ICD-10-CM | POA: Diagnosis not present

## 2015-07-01 DIAGNOSIS — D631 Anemia in chronic kidney disease: Secondary | ICD-10-CM | POA: Diagnosis not present

## 2015-07-01 DIAGNOSIS — D508 Other iron deficiency anemias: Secondary | ICD-10-CM | POA: Diagnosis not present

## 2015-07-04 DIAGNOSIS — N2581 Secondary hyperparathyroidism of renal origin: Secondary | ICD-10-CM | POA: Diagnosis not present

## 2015-07-04 DIAGNOSIS — D631 Anemia in chronic kidney disease: Secondary | ICD-10-CM | POA: Diagnosis not present

## 2015-07-04 DIAGNOSIS — D508 Other iron deficiency anemias: Secondary | ICD-10-CM | POA: Diagnosis not present

## 2015-07-04 DIAGNOSIS — A498 Other bacterial infections of unspecified site: Secondary | ICD-10-CM | POA: Diagnosis not present

## 2015-07-04 DIAGNOSIS — E1129 Type 2 diabetes mellitus with other diabetic kidney complication: Secondary | ICD-10-CM | POA: Diagnosis not present

## 2015-07-04 DIAGNOSIS — N186 End stage renal disease: Secondary | ICD-10-CM | POA: Diagnosis not present

## 2015-07-06 DIAGNOSIS — N186 End stage renal disease: Secondary | ICD-10-CM | POA: Diagnosis not present

## 2015-07-06 DIAGNOSIS — D508 Other iron deficiency anemias: Secondary | ICD-10-CM | POA: Diagnosis not present

## 2015-07-06 DIAGNOSIS — A498 Other bacterial infections of unspecified site: Secondary | ICD-10-CM | POA: Diagnosis not present

## 2015-07-06 DIAGNOSIS — E1129 Type 2 diabetes mellitus with other diabetic kidney complication: Secondary | ICD-10-CM | POA: Diagnosis not present

## 2015-07-06 DIAGNOSIS — N2581 Secondary hyperparathyroidism of renal origin: Secondary | ICD-10-CM | POA: Diagnosis not present

## 2015-07-06 DIAGNOSIS — D631 Anemia in chronic kidney disease: Secondary | ICD-10-CM | POA: Diagnosis not present

## 2015-07-07 ENCOUNTER — Ambulatory Visit (INDEPENDENT_AMBULATORY_CARE_PROVIDER_SITE_OTHER): Payer: Medicare Other | Admitting: Family Medicine

## 2015-07-07 ENCOUNTER — Encounter: Payer: Self-pay | Admitting: Family Medicine

## 2015-07-07 VITALS — BP 133/86 | HR 85 | Temp 98.6°F | Resp 16 | Ht 68.0 in | Wt 182.0 lb

## 2015-07-07 DIAGNOSIS — I120 Hypertensive chronic kidney disease with stage 5 chronic kidney disease or end stage renal disease: Secondary | ICD-10-CM

## 2015-07-07 DIAGNOSIS — E1122 Type 2 diabetes mellitus with diabetic chronic kidney disease: Secondary | ICD-10-CM | POA: Diagnosis not present

## 2015-07-07 DIAGNOSIS — I5022 Chronic systolic (congestive) heart failure: Secondary | ICD-10-CM | POA: Diagnosis not present

## 2015-07-07 DIAGNOSIS — Z23 Encounter for immunization: Secondary | ICD-10-CM

## 2015-07-07 DIAGNOSIS — N186 End stage renal disease: Secondary | ICD-10-CM | POA: Diagnosis not present

## 2015-07-07 DIAGNOSIS — I2510 Atherosclerotic heart disease of native coronary artery without angina pectoris: Secondary | ICD-10-CM | POA: Diagnosis not present

## 2015-07-07 DIAGNOSIS — J301 Allergic rhinitis due to pollen: Secondary | ICD-10-CM

## 2015-07-07 DIAGNOSIS — J309 Allergic rhinitis, unspecified: Secondary | ICD-10-CM | POA: Insufficient documentation

## 2015-07-07 DIAGNOSIS — Z992 Dependence on renal dialysis: Secondary | ICD-10-CM | POA: Diagnosis not present

## 2015-07-07 LAB — POCT GLYCOSYLATED HEMOGLOBIN (HGB A1C): Hemoglobin A1C: 5.4

## 2015-07-07 MED ORDER — FLUTICASONE PROPIONATE 50 MCG/ACT NA SUSP: 2.0000 | Freq: Every day | NASAL | Status: DC

## 2015-07-07 MED ORDER — LORATADINE 10 MG PO TABS: 10.0000 mg | ORAL_TABLET | Freq: Every day | ORAL | Status: DC

## 2015-07-07 NOTE — Assessment & Plan Note (Signed)
Controlled with current regimen. HD MWF. Anuric. No ACE or ARB per [facility] Nephrology.

## 2015-07-07 NOTE — Assessment & Plan Note (Addendum)
Volume controlled with HD. On Betablocker and aspirin. No ACE ARB due to renal function. Last EF 35-40% Lipid panel ordered.  Managed by cardiology.

## 2015-07-07 NOTE — Progress Notes (Signed)
Subjective:    Patient ID: Luis Walter, male    DOB: 08-02-1951, 64 y.o.   MRN: [id number]  HPI: Luis Walter is a 64 y.o. male presenting on 07/07/2015 for Diabetes   HPI  Pt presents for follow-up of diabetes, HTN, ESRD.  D Diabetes: No medications last A1c WNL. Recheck today. No numbness tingling feet. Eye exam done in 03/2015. Normal. No urine microalbumin will be done due to anuria.  HTN: Well controlled with current medications HD MWF. Not on ACE due to renal function. This is managed by [facility] Neph[facility].  CAD/CHF: Managed by cardiology. Last EF 35-40%. On aspirin and betablocker. No ACE/ARB  Pt declining flu shot. Desiring pneumonia.   Past Medical History  Diagnosis Date  . ESRD (end stage renal disease) ([place])     a. on HD M,W,F; b. previously on transplant list at [facility] - removed 2016  . DM2 (diabetes mellitus, type 2) ([place])   . CAD (coronary artery disease)     a. cardiac cath 2014: LM nl, mLAD 30%, dLAD 30%, ostLCx 50%, mid ramus 20% OM3 80% pRCA 20%, mRCA 20%, PDA 100% w/ left to right collats, medically managed   . Stroke ([place])   . Chronic systolic CHF (congestive heart failure) ([place])     a. echo 12/2014: EF 35-40%, mod diffuse HK, RWMA cannot be excluded, LV diastolic fxn parameters nl, mild MR, moderately dilated LA, mildly dilated RV internal cavity size, mild TR, PASP nl  . Gallstones   . Dialysis patient [facility])     Current Outpatient Prescriptions on File Prior to Visit  Medication Sig  . aspirin EC 81 MG tablet Take 1 tablet (81 mg total) by mouth daily.  Marland Kitchen atorvastatin (LIPITOR) 10 MG tablet Take 1 tablet (10 mg total) by mouth daily.  . hydrALAZINE (APRESOLINE) 10 MG tablet Take 1 tablet (10 mg total) by mouth 3 (three) times daily.  . isosorbide mononitrate (IMDUR) 30 MG 24 hr tablet Take 0.5 tablets (15 mg total) by mouth daily.  . metoprolol tartrate (LOPRESSOR) 25 MG tablet Take 1 tablet (25 mg total) by mouth 2 (two) times daily.  . Misc. Devices  (CANE) MISC 1 each by Does not apply route daily. Please get 3 prong or wide based cane for ambulation.  . nortriptyline (PAMELOR) 10 MG capsule Take 1 capsule at night for one week, then increase to 2 capsules at night and continue this dose.  . sevelamer carbonate (RENVELA) 800 MG tablet Take 3,200 mg by mouth 3 (three) times daily with meals.   No current facility-administered medications on file prior to visit.    Review of Systems Per HPI unless specifically indicated above     Objective:    BP 133/86 mmHg  Pulse 85  Temp(Src) 98.6 F (37 C) (Oral)  Resp 16  Ht 5\' 8"  (1.727 m)  Wt 182 lb (82.555 kg)  BMI 27.68 kg/m2  Wt Readings from Last 3 Encounters:  07/07/15 182 lb (82.555 kg)  05/31/15 185 lb (83.915 kg)  05/03/15 182 lb 9.6 oz (82.827 kg)    Physical Exam   Depression screen PHQ 2/9 07/07/2015  Decreased Interest 0  Down, Depressed, Hopeless 0  PHQ - 2 Score 0    Results for orders placed or performed in visit on 07/07/15  POCT HgB A1C  Result Value Ref Range   Hemoglobin A1C 5.4       Assessment & Plan:   Problem List Items Addressed This Visit  Cardiovascular and Mediastinum   Chronic systolic CHF (congestive heart failure) (HCC)    Volume controlled with HD. On Betablocker and aspirin. No ACE ARB due to renal function. Last EF 35-40% Lipid panel ordered.  Managed by cardiology.       Relevant Orders   Lipid panel   Benign hypertension with ESRD (end-stage renal disease) ([place])    Controlled with current regimen. HD MWF. Anuric. No ACE or ARB per [facility] Nephrology.       CAD in native artery    Medically managed by cardiology. On Atorvastatin. Lipid panel ordered today.       Relevant Orders   Lipid panel     Respiratory   Allergic rhinitis    Renewed Claritin today. Start fluticasone for allergic rhinitis symptoms.       Relevant Medications   fluticasone (FLONASE) 50 MCG/ACT nasal spray   loratadine (CLARITIN) 10 MG tablet      Endocrine   Type 2 diabetes, controlled, with renal manifestation (HCC) - Primary    A1c is 5.4%. Controlled. No medication necessary. Foot exam UTD Eye exam UTD. Due 03/2016      Relevant Orders   POCT HgB A1C (Completed)    Other Visit Diagnoses    Need for Streptococcus pneumoniae and influenza vaccination        Relevant Orders    Pneumococcal polysaccharide vaccine 23-valent greater than or equal to 2yo subcutaneous/IM (Completed)       Meds ordered this encounter  Medications  . pantoprazole (PROTONIX) 40 MG tablet    Sig: Take by mouth.  . sucralfate (CARAFATE) 1 G tablet    Sig: Take by mouth.  . fluticasone (FLONASE) 50 MCG/ACT nasal spray    Sig: Place 2 sprays into both nostrils daily.    Dispense:  16 g    Refill:  11    Order Specific Question:  Supervising Provider    Answer:  Arlis Porta [phone]  . loratadine (CLARITIN) 10 MG tablet    Sig: Take 1 tablet (10 mg total) by mouth daily.    Dispense:  30 tablet    Refill:  11    OTC    Order Specific Question:  Supervising Provider    Answer:  Arlis Porta [id number]      Follow up plan: Return in about 1 year (around 07/06/2016).

## 2015-07-07 NOTE — Assessment & Plan Note (Signed)
A1c is 5.4%. Controlled. No medication necessary. Foot exam UTD Eye exam UTD. Due 03/2016

## 2015-07-07 NOTE — Assessment & Plan Note (Signed)
Renewed Claritin today. Start fluticasone for allergic rhinitis symptoms.

## 2015-07-07 NOTE — Patient Instructions (Signed)
Please seek immediate medical attention at ER or Urgent Care if you develop: Chest pain, pressure or tightness. Shortness of breath accompanied by nausea or diaphoresis Visual changes Numbness or tingling on one side of the body Facial droop Altered mental status Or any concerning symptoms.  

## 2015-07-07 NOTE — Assessment & Plan Note (Signed)
Medically managed by cardiology. On Atorvastatin. Lipid panel ordered today.

## 2015-07-08 DIAGNOSIS — E1129 Type 2 diabetes mellitus with other diabetic kidney complication: Secondary | ICD-10-CM | POA: Diagnosis not present

## 2015-07-08 DIAGNOSIS — N186 End stage renal disease: Secondary | ICD-10-CM | POA: Diagnosis not present

## 2015-07-08 DIAGNOSIS — D631 Anemia in chronic kidney disease: Secondary | ICD-10-CM | POA: Diagnosis not present

## 2015-07-08 DIAGNOSIS — D508 Other iron deficiency anemias: Secondary | ICD-10-CM | POA: Diagnosis not present

## 2015-07-08 DIAGNOSIS — N2581 Secondary hyperparathyroidism of renal origin: Secondary | ICD-10-CM | POA: Diagnosis not present

## 2015-07-08 DIAGNOSIS — A498 Other bacterial infections of unspecified site: Secondary | ICD-10-CM | POA: Diagnosis not present

## 2015-07-11 DIAGNOSIS — D631 Anemia in chronic kidney disease: Secondary | ICD-10-CM | POA: Diagnosis not present

## 2015-07-11 DIAGNOSIS — D508 Other iron deficiency anemias: Secondary | ICD-10-CM | POA: Diagnosis not present

## 2015-07-11 DIAGNOSIS — N2581 Secondary hyperparathyroidism of renal origin: Secondary | ICD-10-CM | POA: Diagnosis not present

## 2015-07-11 DIAGNOSIS — N186 End stage renal disease: Secondary | ICD-10-CM | POA: Diagnosis not present

## 2015-07-11 DIAGNOSIS — A498 Other bacterial infections of unspecified site: Secondary | ICD-10-CM | POA: Diagnosis not present

## 2015-07-11 DIAGNOSIS — E1129 Type 2 diabetes mellitus with other diabetic kidney complication: Secondary | ICD-10-CM | POA: Diagnosis not present

## 2015-07-13 DIAGNOSIS — N2581 Secondary hyperparathyroidism of renal origin: Secondary | ICD-10-CM | POA: Diagnosis not present

## 2015-07-13 DIAGNOSIS — D631 Anemia in chronic kidney disease: Secondary | ICD-10-CM | POA: Diagnosis not present

## 2015-07-13 DIAGNOSIS — D508 Other iron deficiency anemias: Secondary | ICD-10-CM | POA: Diagnosis not present

## 2015-07-13 DIAGNOSIS — A498 Other bacterial infections of unspecified site: Secondary | ICD-10-CM | POA: Diagnosis not present

## 2015-07-13 DIAGNOSIS — E1129 Type 2 diabetes mellitus with other diabetic kidney complication: Secondary | ICD-10-CM | POA: Diagnosis not present

## 2015-07-13 DIAGNOSIS — N186 End stage renal disease: Secondary | ICD-10-CM | POA: Diagnosis not present

## 2015-07-15 DIAGNOSIS — D631 Anemia in chronic kidney disease: Secondary | ICD-10-CM | POA: Diagnosis not present

## 2015-07-15 DIAGNOSIS — A498 Other bacterial infections of unspecified site: Secondary | ICD-10-CM | POA: Diagnosis not present

## 2015-07-15 DIAGNOSIS — D508 Other iron deficiency anemias: Secondary | ICD-10-CM | POA: Diagnosis not present

## 2015-07-15 DIAGNOSIS — E1129 Type 2 diabetes mellitus with other diabetic kidney complication: Secondary | ICD-10-CM | POA: Diagnosis not present

## 2015-07-15 DIAGNOSIS — N186 End stage renal disease: Secondary | ICD-10-CM | POA: Diagnosis not present

## 2015-07-15 DIAGNOSIS — N2581 Secondary hyperparathyroidism of renal origin: Secondary | ICD-10-CM | POA: Diagnosis not present

## 2015-07-18 DIAGNOSIS — N2581 Secondary hyperparathyroidism of renal origin: Secondary | ICD-10-CM | POA: Diagnosis not present

## 2015-07-18 DIAGNOSIS — D631 Anemia in chronic kidney disease: Secondary | ICD-10-CM | POA: Diagnosis not present

## 2015-07-18 DIAGNOSIS — E1129 Type 2 diabetes mellitus with other diabetic kidney complication: Secondary | ICD-10-CM | POA: Diagnosis not present

## 2015-07-18 DIAGNOSIS — A498 Other bacterial infections of unspecified site: Secondary | ICD-10-CM | POA: Diagnosis not present

## 2015-07-18 DIAGNOSIS — D508 Other iron deficiency anemias: Secondary | ICD-10-CM | POA: Diagnosis not present

## 2015-07-18 DIAGNOSIS — N186 End stage renal disease: Secondary | ICD-10-CM | POA: Diagnosis not present

## 2015-07-20 DIAGNOSIS — E1122 Type 2 diabetes mellitus with diabetic chronic kidney disease: Secondary | ICD-10-CM | POA: Diagnosis not present

## 2015-07-20 DIAGNOSIS — A498 Other bacterial infections of unspecified site: Secondary | ICD-10-CM | POA: Diagnosis not present

## 2015-07-20 DIAGNOSIS — D508 Other iron deficiency anemias: Secondary | ICD-10-CM | POA: Diagnosis not present

## 2015-07-20 DIAGNOSIS — N2581 Secondary hyperparathyroidism of renal origin: Secondary | ICD-10-CM | POA: Diagnosis not present

## 2015-07-20 DIAGNOSIS — N186 End stage renal disease: Secondary | ICD-10-CM | POA: Diagnosis not present

## 2015-07-20 DIAGNOSIS — Z992 Dependence on renal dialysis: Secondary | ICD-10-CM | POA: Diagnosis not present

## 2015-07-20 DIAGNOSIS — E1129 Type 2 diabetes mellitus with other diabetic kidney complication: Secondary | ICD-10-CM | POA: Diagnosis not present

## 2015-07-20 DIAGNOSIS — D631 Anemia in chronic kidney disease: Secondary | ICD-10-CM | POA: Diagnosis not present

## 2015-07-22 DIAGNOSIS — N2581 Secondary hyperparathyroidism of renal origin: Secondary | ICD-10-CM | POA: Diagnosis not present

## 2015-07-22 DIAGNOSIS — E1129 Type 2 diabetes mellitus with other diabetic kidney complication: Secondary | ICD-10-CM | POA: Diagnosis not present

## 2015-07-22 DIAGNOSIS — D508 Other iron deficiency anemias: Secondary | ICD-10-CM | POA: Diagnosis not present

## 2015-07-22 DIAGNOSIS — N186 End stage renal disease: Secondary | ICD-10-CM | POA: Diagnosis not present

## 2015-07-25 DIAGNOSIS — N186 End stage renal disease: Secondary | ICD-10-CM | POA: Diagnosis not present

## 2015-07-25 DIAGNOSIS — N2581 Secondary hyperparathyroidism of renal origin: Secondary | ICD-10-CM | POA: Diagnosis not present

## 2015-07-25 DIAGNOSIS — D508 Other iron deficiency anemias: Secondary | ICD-10-CM | POA: Diagnosis not present

## 2015-07-25 DIAGNOSIS — E1129 Type 2 diabetes mellitus with other diabetic kidney complication: Secondary | ICD-10-CM | POA: Diagnosis not present

## 2015-07-27 DIAGNOSIS — D508 Other iron deficiency anemias: Secondary | ICD-10-CM | POA: Diagnosis not present

## 2015-07-27 DIAGNOSIS — N2581 Secondary hyperparathyroidism of renal origin: Secondary | ICD-10-CM | POA: Diagnosis not present

## 2015-07-27 DIAGNOSIS — E1129 Type 2 diabetes mellitus with other diabetic kidney complication: Secondary | ICD-10-CM | POA: Diagnosis not present

## 2015-07-27 DIAGNOSIS — N186 End stage renal disease: Secondary | ICD-10-CM | POA: Diagnosis not present

## 2015-07-29 DIAGNOSIS — D508 Other iron deficiency anemias: Secondary | ICD-10-CM | POA: Diagnosis not present

## 2015-07-29 DIAGNOSIS — E1129 Type 2 diabetes mellitus with other diabetic kidney complication: Secondary | ICD-10-CM | POA: Diagnosis not present

## 2015-07-29 DIAGNOSIS — N2581 Secondary hyperparathyroidism of renal origin: Secondary | ICD-10-CM | POA: Diagnosis not present

## 2015-07-29 DIAGNOSIS — N186 End stage renal disease: Secondary | ICD-10-CM | POA: Diagnosis not present

## 2015-08-01 DIAGNOSIS — N186 End stage renal disease: Secondary | ICD-10-CM | POA: Diagnosis not present

## 2015-08-01 DIAGNOSIS — E1129 Type 2 diabetes mellitus with other diabetic kidney complication: Secondary | ICD-10-CM | POA: Diagnosis not present

## 2015-08-01 DIAGNOSIS — N2581 Secondary hyperparathyroidism of renal origin: Secondary | ICD-10-CM | POA: Diagnosis not present

## 2015-08-01 DIAGNOSIS — D508 Other iron deficiency anemias: Secondary | ICD-10-CM | POA: Diagnosis not present

## 2015-08-03 DIAGNOSIS — E1129 Type 2 diabetes mellitus with other diabetic kidney complication: Secondary | ICD-10-CM | POA: Diagnosis not present

## 2015-08-03 DIAGNOSIS — D508 Other iron deficiency anemias: Secondary | ICD-10-CM | POA: Diagnosis not present

## 2015-08-03 DIAGNOSIS — N186 End stage renal disease: Secondary | ICD-10-CM | POA: Diagnosis not present

## 2015-08-03 DIAGNOSIS — N2581 Secondary hyperparathyroidism of renal origin: Secondary | ICD-10-CM | POA: Diagnosis not present

## 2015-08-05 DIAGNOSIS — N2581 Secondary hyperparathyroidism of renal origin: Secondary | ICD-10-CM | POA: Diagnosis not present

## 2015-08-05 DIAGNOSIS — E1129 Type 2 diabetes mellitus with other diabetic kidney complication: Secondary | ICD-10-CM | POA: Diagnosis not present

## 2015-08-05 DIAGNOSIS — N186 End stage renal disease: Secondary | ICD-10-CM | POA: Diagnosis not present

## 2015-08-05 DIAGNOSIS — D508 Other iron deficiency anemias: Secondary | ICD-10-CM | POA: Diagnosis not present

## 2015-08-08 DIAGNOSIS — D508 Other iron deficiency anemias: Secondary | ICD-10-CM | POA: Diagnosis not present

## 2015-08-08 DIAGNOSIS — N186 End stage renal disease: Secondary | ICD-10-CM | POA: Diagnosis not present

## 2015-08-08 DIAGNOSIS — N2581 Secondary hyperparathyroidism of renal origin: Secondary | ICD-10-CM | POA: Diagnosis not present

## 2015-08-08 DIAGNOSIS — E1129 Type 2 diabetes mellitus with other diabetic kidney complication: Secondary | ICD-10-CM | POA: Diagnosis not present

## 2015-08-10 DIAGNOSIS — E1129 Type 2 diabetes mellitus with other diabetic kidney complication: Secondary | ICD-10-CM | POA: Diagnosis not present

## 2015-08-10 DIAGNOSIS — N2581 Secondary hyperparathyroidism of renal origin: Secondary | ICD-10-CM | POA: Diagnosis not present

## 2015-08-10 DIAGNOSIS — N186 End stage renal disease: Secondary | ICD-10-CM | POA: Diagnosis not present

## 2015-08-10 DIAGNOSIS — D508 Other iron deficiency anemias: Secondary | ICD-10-CM | POA: Diagnosis not present

## 2015-08-12 DIAGNOSIS — E1129 Type 2 diabetes mellitus with other diabetic kidney complication: Secondary | ICD-10-CM | POA: Diagnosis not present

## 2015-08-12 DIAGNOSIS — D508 Other iron deficiency anemias: Secondary | ICD-10-CM | POA: Diagnosis not present

## 2015-08-12 DIAGNOSIS — N186 End stage renal disease: Secondary | ICD-10-CM | POA: Diagnosis not present

## 2015-08-12 DIAGNOSIS — N2581 Secondary hyperparathyroidism of renal origin: Secondary | ICD-10-CM | POA: Diagnosis not present

## 2015-08-15 DIAGNOSIS — N186 End stage renal disease: Secondary | ICD-10-CM | POA: Diagnosis not present

## 2015-08-15 DIAGNOSIS — N2581 Secondary hyperparathyroidism of renal origin: Secondary | ICD-10-CM | POA: Diagnosis not present

## 2015-08-15 DIAGNOSIS — D508 Other iron deficiency anemias: Secondary | ICD-10-CM | POA: Diagnosis not present

## 2015-08-15 DIAGNOSIS — E1129 Type 2 diabetes mellitus with other diabetic kidney complication: Secondary | ICD-10-CM | POA: Diagnosis not present

## 2015-08-17 DIAGNOSIS — N2581 Secondary hyperparathyroidism of renal origin: Secondary | ICD-10-CM | POA: Diagnosis not present

## 2015-08-17 DIAGNOSIS — N186 End stage renal disease: Secondary | ICD-10-CM | POA: Diagnosis not present

## 2015-08-17 DIAGNOSIS — D508 Other iron deficiency anemias: Secondary | ICD-10-CM | POA: Diagnosis not present

## 2015-08-17 DIAGNOSIS — E1129 Type 2 diabetes mellitus with other diabetic kidney complication: Secondary | ICD-10-CM | POA: Diagnosis not present

## 2015-08-19 DIAGNOSIS — N2581 Secondary hyperparathyroidism of renal origin: Secondary | ICD-10-CM | POA: Diagnosis not present

## 2015-08-19 DIAGNOSIS — D508 Other iron deficiency anemias: Secondary | ICD-10-CM | POA: Diagnosis not present

## 2015-08-19 DIAGNOSIS — E1129 Type 2 diabetes mellitus with other diabetic kidney complication: Secondary | ICD-10-CM | POA: Diagnosis not present

## 2015-08-19 DIAGNOSIS — N186 End stage renal disease: Secondary | ICD-10-CM | POA: Diagnosis not present

## 2015-08-20 DIAGNOSIS — N186 End stage renal disease: Secondary | ICD-10-CM | POA: Diagnosis not present

## 2015-08-20 DIAGNOSIS — E1122 Type 2 diabetes mellitus with diabetic chronic kidney disease: Secondary | ICD-10-CM | POA: Diagnosis not present

## 2015-08-20 DIAGNOSIS — Z992 Dependence on renal dialysis: Secondary | ICD-10-CM | POA: Diagnosis not present

## 2015-08-22 DIAGNOSIS — N2581 Secondary hyperparathyroidism of renal origin: Secondary | ICD-10-CM | POA: Diagnosis not present

## 2015-08-22 DIAGNOSIS — D631 Anemia in chronic kidney disease: Secondary | ICD-10-CM | POA: Diagnosis not present

## 2015-08-22 DIAGNOSIS — D508 Other iron deficiency anemias: Secondary | ICD-10-CM | POA: Diagnosis not present

## 2015-08-22 DIAGNOSIS — N186 End stage renal disease: Secondary | ICD-10-CM | POA: Diagnosis not present

## 2015-08-22 DIAGNOSIS — E1129 Type 2 diabetes mellitus with other diabetic kidney complication: Secondary | ICD-10-CM | POA: Diagnosis not present

## 2015-08-24 DIAGNOSIS — E1129 Type 2 diabetes mellitus with other diabetic kidney complication: Secondary | ICD-10-CM | POA: Diagnosis not present

## 2015-08-24 DIAGNOSIS — D631 Anemia in chronic kidney disease: Secondary | ICD-10-CM | POA: Diagnosis not present

## 2015-08-24 DIAGNOSIS — N2581 Secondary hyperparathyroidism of renal origin: Secondary | ICD-10-CM | POA: Diagnosis not present

## 2015-08-24 DIAGNOSIS — N186 End stage renal disease: Secondary | ICD-10-CM | POA: Diagnosis not present

## 2015-08-24 DIAGNOSIS — D508 Other iron deficiency anemias: Secondary | ICD-10-CM | POA: Diagnosis not present

## 2015-08-26 DIAGNOSIS — D631 Anemia in chronic kidney disease: Secondary | ICD-10-CM | POA: Diagnosis not present

## 2015-08-26 DIAGNOSIS — N186 End stage renal disease: Secondary | ICD-10-CM | POA: Diagnosis not present

## 2015-08-26 DIAGNOSIS — N2581 Secondary hyperparathyroidism of renal origin: Secondary | ICD-10-CM | POA: Diagnosis not present

## 2015-08-26 DIAGNOSIS — E1129 Type 2 diabetes mellitus with other diabetic kidney complication: Secondary | ICD-10-CM | POA: Diagnosis not present

## 2015-08-26 DIAGNOSIS — D508 Other iron deficiency anemias: Secondary | ICD-10-CM | POA: Diagnosis not present

## 2015-08-29 DIAGNOSIS — N2581 Secondary hyperparathyroidism of renal origin: Secondary | ICD-10-CM | POA: Diagnosis not present

## 2015-08-29 DIAGNOSIS — D631 Anemia in chronic kidney disease: Secondary | ICD-10-CM | POA: Diagnosis not present

## 2015-08-29 DIAGNOSIS — E1129 Type 2 diabetes mellitus with other diabetic kidney complication: Secondary | ICD-10-CM | POA: Diagnosis not present

## 2015-08-29 DIAGNOSIS — D508 Other iron deficiency anemias: Secondary | ICD-10-CM | POA: Diagnosis not present

## 2015-08-29 DIAGNOSIS — N186 End stage renal disease: Secondary | ICD-10-CM | POA: Diagnosis not present

## 2015-08-31 DIAGNOSIS — N186 End stage renal disease: Secondary | ICD-10-CM | POA: Diagnosis not present

## 2015-08-31 DIAGNOSIS — E1129 Type 2 diabetes mellitus with other diabetic kidney complication: Secondary | ICD-10-CM | POA: Diagnosis not present

## 2015-08-31 DIAGNOSIS — N2581 Secondary hyperparathyroidism of renal origin: Secondary | ICD-10-CM | POA: Diagnosis not present

## 2015-08-31 DIAGNOSIS — D631 Anemia in chronic kidney disease: Secondary | ICD-10-CM | POA: Diagnosis not present

## 2015-08-31 DIAGNOSIS — D508 Other iron deficiency anemias: Secondary | ICD-10-CM | POA: Diagnosis not present

## 2015-09-01 DIAGNOSIS — L739 Follicular disorder, unspecified: Secondary | ICD-10-CM | POA: Diagnosis not present

## 2015-09-01 DIAGNOSIS — L720 Epidermal cyst: Secondary | ICD-10-CM | POA: Diagnosis not present

## 2015-09-02 DIAGNOSIS — D508 Other iron deficiency anemias: Secondary | ICD-10-CM | POA: Diagnosis not present

## 2015-09-02 DIAGNOSIS — E1129 Type 2 diabetes mellitus with other diabetic kidney complication: Secondary | ICD-10-CM | POA: Diagnosis not present

## 2015-09-02 DIAGNOSIS — N2581 Secondary hyperparathyroidism of renal origin: Secondary | ICD-10-CM | POA: Diagnosis not present

## 2015-09-02 DIAGNOSIS — D631 Anemia in chronic kidney disease: Secondary | ICD-10-CM | POA: Diagnosis not present

## 2015-09-02 DIAGNOSIS — N186 End stage renal disease: Secondary | ICD-10-CM | POA: Diagnosis not present

## 2015-09-05 DIAGNOSIS — D508 Other iron deficiency anemias: Secondary | ICD-10-CM | POA: Diagnosis not present

## 2015-09-05 DIAGNOSIS — N2581 Secondary hyperparathyroidism of renal origin: Secondary | ICD-10-CM | POA: Diagnosis not present

## 2015-09-05 DIAGNOSIS — N186 End stage renal disease: Secondary | ICD-10-CM | POA: Diagnosis not present

## 2015-09-05 DIAGNOSIS — D631 Anemia in chronic kidney disease: Secondary | ICD-10-CM | POA: Diagnosis not present

## 2015-09-05 DIAGNOSIS — E1129 Type 2 diabetes mellitus with other diabetic kidney complication: Secondary | ICD-10-CM | POA: Diagnosis not present

## 2015-09-07 DIAGNOSIS — N186 End stage renal disease: Secondary | ICD-10-CM | POA: Diagnosis not present

## 2015-09-07 DIAGNOSIS — D631 Anemia in chronic kidney disease: Secondary | ICD-10-CM | POA: Diagnosis not present

## 2015-09-07 DIAGNOSIS — D508 Other iron deficiency anemias: Secondary | ICD-10-CM | POA: Diagnosis not present

## 2015-09-07 DIAGNOSIS — N2581 Secondary hyperparathyroidism of renal origin: Secondary | ICD-10-CM | POA: Diagnosis not present

## 2015-09-07 DIAGNOSIS — E1129 Type 2 diabetes mellitus with other diabetic kidney complication: Secondary | ICD-10-CM | POA: Diagnosis not present

## 2015-09-09 DIAGNOSIS — E1129 Type 2 diabetes mellitus with other diabetic kidney complication: Secondary | ICD-10-CM | POA: Diagnosis not present

## 2015-09-09 DIAGNOSIS — D631 Anemia in chronic kidney disease: Secondary | ICD-10-CM | POA: Diagnosis not present

## 2015-09-09 DIAGNOSIS — D508 Other iron deficiency anemias: Secondary | ICD-10-CM | POA: Diagnosis not present

## 2015-09-09 DIAGNOSIS — N186 End stage renal disease: Secondary | ICD-10-CM | POA: Diagnosis not present

## 2015-09-09 DIAGNOSIS — N2581 Secondary hyperparathyroidism of renal origin: Secondary | ICD-10-CM | POA: Diagnosis not present

## 2015-09-12 DIAGNOSIS — D508 Other iron deficiency anemias: Secondary | ICD-10-CM | POA: Diagnosis not present

## 2015-09-12 DIAGNOSIS — N2581 Secondary hyperparathyroidism of renal origin: Secondary | ICD-10-CM | POA: Diagnosis not present

## 2015-09-12 DIAGNOSIS — N186 End stage renal disease: Secondary | ICD-10-CM | POA: Diagnosis not present

## 2015-09-12 DIAGNOSIS — D631 Anemia in chronic kidney disease: Secondary | ICD-10-CM | POA: Diagnosis not present

## 2015-09-12 DIAGNOSIS — E1129 Type 2 diabetes mellitus with other diabetic kidney complication: Secondary | ICD-10-CM | POA: Diagnosis not present

## 2015-09-14 DIAGNOSIS — N186 End stage renal disease: Secondary | ICD-10-CM | POA: Diagnosis not present

## 2015-09-14 DIAGNOSIS — E1129 Type 2 diabetes mellitus with other diabetic kidney complication: Secondary | ICD-10-CM | POA: Diagnosis not present

## 2015-09-14 DIAGNOSIS — D508 Other iron deficiency anemias: Secondary | ICD-10-CM | POA: Diagnosis not present

## 2015-09-14 DIAGNOSIS — N2581 Secondary hyperparathyroidism of renal origin: Secondary | ICD-10-CM | POA: Diagnosis not present

## 2015-09-14 DIAGNOSIS — D631 Anemia in chronic kidney disease: Secondary | ICD-10-CM | POA: Diagnosis not present

## 2015-09-16 DIAGNOSIS — D508 Other iron deficiency anemias: Secondary | ICD-10-CM | POA: Diagnosis not present

## 2015-09-16 DIAGNOSIS — D631 Anemia in chronic kidney disease: Secondary | ICD-10-CM | POA: Diagnosis not present

## 2015-09-16 DIAGNOSIS — N2581 Secondary hyperparathyroidism of renal origin: Secondary | ICD-10-CM | POA: Diagnosis not present

## 2015-09-16 DIAGNOSIS — E1129 Type 2 diabetes mellitus with other diabetic kidney complication: Secondary | ICD-10-CM | POA: Diagnosis not present

## 2015-09-16 DIAGNOSIS — N186 End stage renal disease: Secondary | ICD-10-CM | POA: Diagnosis not present

## 2015-09-19 DIAGNOSIS — N186 End stage renal disease: Secondary | ICD-10-CM | POA: Diagnosis not present

## 2015-09-19 DIAGNOSIS — N2581 Secondary hyperparathyroidism of renal origin: Secondary | ICD-10-CM | POA: Diagnosis not present

## 2015-09-19 DIAGNOSIS — E1129 Type 2 diabetes mellitus with other diabetic kidney complication: Secondary | ICD-10-CM | POA: Diagnosis not present

## 2015-09-19 DIAGNOSIS — D508 Other iron deficiency anemias: Secondary | ICD-10-CM | POA: Diagnosis not present

## 2015-09-19 DIAGNOSIS — D631 Anemia in chronic kidney disease: Secondary | ICD-10-CM | POA: Diagnosis not present

## 2015-09-20 DIAGNOSIS — N186 End stage renal disease: Secondary | ICD-10-CM | POA: Diagnosis not present

## 2015-09-20 DIAGNOSIS — I120 Hypertensive chronic kidney disease with stage 5 chronic kidney disease or end stage renal disease: Secondary | ICD-10-CM | POA: Diagnosis not present

## 2015-09-20 DIAGNOSIS — Z992 Dependence on renal dialysis: Secondary | ICD-10-CM | POA: Diagnosis not present

## 2015-09-21 DIAGNOSIS — D508 Other iron deficiency anemias: Secondary | ICD-10-CM | POA: Diagnosis not present

## 2015-09-21 DIAGNOSIS — N186 End stage renal disease: Secondary | ICD-10-CM | POA: Diagnosis not present

## 2015-09-21 DIAGNOSIS — E1129 Type 2 diabetes mellitus with other diabetic kidney complication: Secondary | ICD-10-CM | POA: Diagnosis not present

## 2015-09-21 DIAGNOSIS — D631 Anemia in chronic kidney disease: Secondary | ICD-10-CM | POA: Diagnosis not present

## 2015-09-21 DIAGNOSIS — N2581 Secondary hyperparathyroidism of renal origin: Secondary | ICD-10-CM | POA: Diagnosis not present

## 2015-09-23 DIAGNOSIS — E1129 Type 2 diabetes mellitus with other diabetic kidney complication: Secondary | ICD-10-CM | POA: Diagnosis not present

## 2015-09-23 DIAGNOSIS — N186 End stage renal disease: Secondary | ICD-10-CM | POA: Diagnosis not present

## 2015-09-23 DIAGNOSIS — N2581 Secondary hyperparathyroidism of renal origin: Secondary | ICD-10-CM | POA: Diagnosis not present

## 2015-09-23 DIAGNOSIS — D508 Other iron deficiency anemias: Secondary | ICD-10-CM | POA: Diagnosis not present

## 2015-09-23 DIAGNOSIS — D631 Anemia in chronic kidney disease: Secondary | ICD-10-CM | POA: Diagnosis not present

## 2015-09-26 DIAGNOSIS — E1129 Type 2 diabetes mellitus with other diabetic kidney complication: Secondary | ICD-10-CM | POA: Diagnosis not present

## 2015-09-26 DIAGNOSIS — N2581 Secondary hyperparathyroidism of renal origin: Secondary | ICD-10-CM | POA: Diagnosis not present

## 2015-09-26 DIAGNOSIS — N186 End stage renal disease: Secondary | ICD-10-CM | POA: Diagnosis not present

## 2015-09-26 DIAGNOSIS — D631 Anemia in chronic kidney disease: Secondary | ICD-10-CM | POA: Diagnosis not present

## 2015-09-26 DIAGNOSIS — D508 Other iron deficiency anemias: Secondary | ICD-10-CM | POA: Diagnosis not present

## 2015-09-28 DIAGNOSIS — N2581 Secondary hyperparathyroidism of renal origin: Secondary | ICD-10-CM | POA: Diagnosis not present

## 2015-09-28 DIAGNOSIS — D508 Other iron deficiency anemias: Secondary | ICD-10-CM | POA: Diagnosis not present

## 2015-09-28 DIAGNOSIS — N186 End stage renal disease: Secondary | ICD-10-CM | POA: Diagnosis not present

## 2015-09-28 DIAGNOSIS — E1129 Type 2 diabetes mellitus with other diabetic kidney complication: Secondary | ICD-10-CM | POA: Diagnosis not present

## 2015-09-28 DIAGNOSIS — D631 Anemia in chronic kidney disease: Secondary | ICD-10-CM | POA: Diagnosis not present

## 2015-09-30 DIAGNOSIS — D508 Other iron deficiency anemias: Secondary | ICD-10-CM | POA: Diagnosis not present

## 2015-09-30 DIAGNOSIS — E1129 Type 2 diabetes mellitus with other diabetic kidney complication: Secondary | ICD-10-CM | POA: Diagnosis not present

## 2015-09-30 DIAGNOSIS — N186 End stage renal disease: Secondary | ICD-10-CM | POA: Diagnosis not present

## 2015-09-30 DIAGNOSIS — N2581 Secondary hyperparathyroidism of renal origin: Secondary | ICD-10-CM | POA: Diagnosis not present

## 2015-09-30 DIAGNOSIS — D631 Anemia in chronic kidney disease: Secondary | ICD-10-CM | POA: Diagnosis not present

## 2015-10-03 DIAGNOSIS — N2581 Secondary hyperparathyroidism of renal origin: Secondary | ICD-10-CM | POA: Diagnosis not present

## 2015-10-03 DIAGNOSIS — E1129 Type 2 diabetes mellitus with other diabetic kidney complication: Secondary | ICD-10-CM | POA: Diagnosis not present

## 2015-10-03 DIAGNOSIS — D508 Other iron deficiency anemias: Secondary | ICD-10-CM | POA: Diagnosis not present

## 2015-10-03 DIAGNOSIS — N186 End stage renal disease: Secondary | ICD-10-CM | POA: Diagnosis not present

## 2015-10-03 DIAGNOSIS — D631 Anemia in chronic kidney disease: Secondary | ICD-10-CM | POA: Diagnosis not present

## 2015-10-05 DIAGNOSIS — N2581 Secondary hyperparathyroidism of renal origin: Secondary | ICD-10-CM | POA: Diagnosis not present

## 2015-10-05 DIAGNOSIS — N186 End stage renal disease: Secondary | ICD-10-CM | POA: Diagnosis not present

## 2015-10-05 DIAGNOSIS — E1129 Type 2 diabetes mellitus with other diabetic kidney complication: Secondary | ICD-10-CM | POA: Diagnosis not present

## 2015-10-05 DIAGNOSIS — D508 Other iron deficiency anemias: Secondary | ICD-10-CM | POA: Diagnosis not present

## 2015-10-05 DIAGNOSIS — D631 Anemia in chronic kidney disease: Secondary | ICD-10-CM | POA: Diagnosis not present

## 2015-10-07 DIAGNOSIS — E1129 Type 2 diabetes mellitus with other diabetic kidney complication: Secondary | ICD-10-CM | POA: Diagnosis not present

## 2015-10-07 DIAGNOSIS — N2581 Secondary hyperparathyroidism of renal origin: Secondary | ICD-10-CM | POA: Diagnosis not present

## 2015-10-07 DIAGNOSIS — D508 Other iron deficiency anemias: Secondary | ICD-10-CM | POA: Diagnosis not present

## 2015-10-07 DIAGNOSIS — N186 End stage renal disease: Secondary | ICD-10-CM | POA: Diagnosis not present

## 2015-10-07 DIAGNOSIS — D631 Anemia in chronic kidney disease: Secondary | ICD-10-CM | POA: Diagnosis not present

## 2015-10-10 DIAGNOSIS — D508 Other iron deficiency anemias: Secondary | ICD-10-CM | POA: Diagnosis not present

## 2015-10-10 DIAGNOSIS — N2581 Secondary hyperparathyroidism of renal origin: Secondary | ICD-10-CM | POA: Diagnosis not present

## 2015-10-10 DIAGNOSIS — D631 Anemia in chronic kidney disease: Secondary | ICD-10-CM | POA: Diagnosis not present

## 2015-10-10 DIAGNOSIS — N186 End stage renal disease: Secondary | ICD-10-CM | POA: Diagnosis not present

## 2015-10-10 DIAGNOSIS — E1129 Type 2 diabetes mellitus with other diabetic kidney complication: Secondary | ICD-10-CM | POA: Diagnosis not present

## 2015-10-12 DIAGNOSIS — E1129 Type 2 diabetes mellitus with other diabetic kidney complication: Secondary | ICD-10-CM | POA: Diagnosis not present

## 2015-10-12 DIAGNOSIS — N186 End stage renal disease: Secondary | ICD-10-CM | POA: Diagnosis not present

## 2015-10-12 DIAGNOSIS — D508 Other iron deficiency anemias: Secondary | ICD-10-CM | POA: Diagnosis not present

## 2015-10-12 DIAGNOSIS — D631 Anemia in chronic kidney disease: Secondary | ICD-10-CM | POA: Diagnosis not present

## 2015-10-12 DIAGNOSIS — N2581 Secondary hyperparathyroidism of renal origin: Secondary | ICD-10-CM | POA: Diagnosis not present

## 2015-10-14 DIAGNOSIS — E1129 Type 2 diabetes mellitus with other diabetic kidney complication: Secondary | ICD-10-CM | POA: Diagnosis not present

## 2015-10-14 DIAGNOSIS — D631 Anemia in chronic kidney disease: Secondary | ICD-10-CM | POA: Diagnosis not present

## 2015-10-14 DIAGNOSIS — N186 End stage renal disease: Secondary | ICD-10-CM | POA: Diagnosis not present

## 2015-10-14 DIAGNOSIS — N2581 Secondary hyperparathyroidism of renal origin: Secondary | ICD-10-CM | POA: Diagnosis not present

## 2015-10-14 DIAGNOSIS — D508 Other iron deficiency anemias: Secondary | ICD-10-CM | POA: Diagnosis not present

## 2015-10-17 DIAGNOSIS — E1129 Type 2 diabetes mellitus with other diabetic kidney complication: Secondary | ICD-10-CM | POA: Diagnosis not present

## 2015-10-17 DIAGNOSIS — N2581 Secondary hyperparathyroidism of renal origin: Secondary | ICD-10-CM | POA: Diagnosis not present

## 2015-10-17 DIAGNOSIS — D508 Other iron deficiency anemias: Secondary | ICD-10-CM | POA: Diagnosis not present

## 2015-10-17 DIAGNOSIS — D631 Anemia in chronic kidney disease: Secondary | ICD-10-CM | POA: Diagnosis not present

## 2015-10-17 DIAGNOSIS — N186 End stage renal disease: Secondary | ICD-10-CM | POA: Diagnosis not present

## 2015-10-18 DIAGNOSIS — E1122 Type 2 diabetes mellitus with diabetic chronic kidney disease: Secondary | ICD-10-CM | POA: Diagnosis not present

## 2015-10-18 DIAGNOSIS — Z992 Dependence on renal dialysis: Secondary | ICD-10-CM | POA: Diagnosis not present

## 2015-10-18 DIAGNOSIS — N186 End stage renal disease: Secondary | ICD-10-CM | POA: Diagnosis not present

## 2015-10-19 DIAGNOSIS — R197 Diarrhea, unspecified: Secondary | ICD-10-CM | POA: Diagnosis not present

## 2015-10-19 DIAGNOSIS — N186 End stage renal disease: Secondary | ICD-10-CM | POA: Diagnosis not present

## 2015-10-19 DIAGNOSIS — Z5181 Encounter for therapeutic drug level monitoring: Secondary | ICD-10-CM | POA: Diagnosis not present

## 2015-10-19 DIAGNOSIS — E1129 Type 2 diabetes mellitus with other diabetic kidney complication: Secondary | ICD-10-CM | POA: Diagnosis not present

## 2015-10-19 DIAGNOSIS — N2581 Secondary hyperparathyroidism of renal origin: Secondary | ICD-10-CM | POA: Diagnosis not present

## 2015-10-19 DIAGNOSIS — D631 Anemia in chronic kidney disease: Secondary | ICD-10-CM | POA: Diagnosis not present

## 2015-10-19 DIAGNOSIS — R6883 Chills (without fever): Secondary | ICD-10-CM | POA: Diagnosis not present

## 2015-10-19 DIAGNOSIS — D508 Other iron deficiency anemias: Secondary | ICD-10-CM | POA: Diagnosis not present

## 2015-10-20 ENCOUNTER — Emergency Department: Payer: Medicare Other

## 2015-10-20 ENCOUNTER — Encounter: Payer: Self-pay | Admitting: *Deleted

## 2015-10-20 ENCOUNTER — Inpatient Hospital Stay
Admission: EM | Admit: 2015-10-20 | Discharge: 2015-10-23 | DRG: 871 | Disposition: A | Discharge status: Home / Self Care | Payer: Medicare Other | Attending: Internal Medicine | Admitting: Internal Medicine

## 2015-10-20 DIAGNOSIS — E1122 Type 2 diabetes mellitus with diabetic chronic kidney disease: Secondary | ICD-10-CM | POA: Diagnosis present

## 2015-10-20 DIAGNOSIS — Z7682 Awaiting organ transplant status: Secondary | ICD-10-CM

## 2015-10-20 DIAGNOSIS — G9340 Encephalopathy, unspecified: Secondary | ICD-10-CM | POA: Diagnosis present

## 2015-10-20 DIAGNOSIS — B9620 Unspecified Escherichia coli [E. coli] as the cause of diseases classified elsewhere: Secondary | ICD-10-CM | POA: Diagnosis present

## 2015-10-20 DIAGNOSIS — Z7982 Long term (current) use of aspirin: Secondary | ICD-10-CM | POA: Diagnosis not present

## 2015-10-20 DIAGNOSIS — I5022 Chronic systolic (congestive) heart failure: Secondary | ICD-10-CM | POA: Diagnosis not present

## 2015-10-20 DIAGNOSIS — I953 Hypotension of hemodialysis: Secondary | ICD-10-CM | POA: Diagnosis present

## 2015-10-20 DIAGNOSIS — I25110 Atherosclerotic heart disease of native coronary artery with unstable angina pectoris: Secondary | ICD-10-CM | POA: Diagnosis not present

## 2015-10-20 DIAGNOSIS — A4150 Gram-negative sepsis, unspecified: Principal | ICD-10-CM | POA: Diagnosis present

## 2015-10-20 DIAGNOSIS — Z833 Family history of diabetes mellitus: Secondary | ICD-10-CM | POA: Diagnosis not present

## 2015-10-20 DIAGNOSIS — R197 Diarrhea, unspecified: Secondary | ICD-10-CM

## 2015-10-20 DIAGNOSIS — Z87891 Personal history of nicotine dependence: Secondary | ICD-10-CM | POA: Diagnosis not present

## 2015-10-20 DIAGNOSIS — Z794 Long term (current) use of insulin: Secondary | ICD-10-CM | POA: Diagnosis not present

## 2015-10-20 DIAGNOSIS — A419 Sepsis, unspecified organism: Secondary | ICD-10-CM | POA: Diagnosis not present

## 2015-10-20 DIAGNOSIS — J9601 Acute respiratory failure with hypoxia: Secondary | ICD-10-CM | POA: Diagnosis present

## 2015-10-20 DIAGNOSIS — Z8249 Family history of ischemic heart disease and other diseases of the circulatory system: Secondary | ICD-10-CM

## 2015-10-20 DIAGNOSIS — I2510 Atherosclerotic heart disease of native coronary artery without angina pectoris: Secondary | ICD-10-CM | POA: Diagnosis present

## 2015-10-20 DIAGNOSIS — K529 Noninfective gastroenteritis and colitis, unspecified: Secondary | ICD-10-CM | POA: Diagnosis present

## 2015-10-20 DIAGNOSIS — Z992 Dependence on renal dialysis: Secondary | ICD-10-CM | POA: Diagnosis not present

## 2015-10-20 DIAGNOSIS — R05 Cough: Secondary | ICD-10-CM | POA: Diagnosis not present

## 2015-10-20 DIAGNOSIS — N186 End stage renal disease: Secondary | ICD-10-CM | POA: Diagnosis not present

## 2015-10-20 DIAGNOSIS — I214 Non-ST elevation (NSTEMI) myocardial infarction: Secondary | ICD-10-CM | POA: Diagnosis present

## 2015-10-20 DIAGNOSIS — A4151 Sepsis due to Escherichia coli [E. coli]: Secondary | ICD-10-CM

## 2015-10-20 DIAGNOSIS — I120 Hypertensive chronic kidney disease with stage 5 chronic kidney disease or end stage renal disease: Secondary | ICD-10-CM | POA: Diagnosis present

## 2015-10-20 DIAGNOSIS — I5023 Acute on chronic systolic (congestive) heart failure: Secondary | ICD-10-CM | POA: Diagnosis present

## 2015-10-20 DIAGNOSIS — J9811 Atelectasis: Secondary | ICD-10-CM | POA: Diagnosis present

## 2015-10-20 DIAGNOSIS — E875 Hyperkalemia: Secondary | ICD-10-CM | POA: Diagnosis present

## 2015-10-20 DIAGNOSIS — R531 Weakness: Secondary | ICD-10-CM

## 2015-10-20 DIAGNOSIS — R509 Fever, unspecified: Secondary | ICD-10-CM | POA: Diagnosis not present

## 2015-10-20 DIAGNOSIS — D631 Anemia in chronic kidney disease: Secondary | ICD-10-CM | POA: Diagnosis not present

## 2015-10-20 DIAGNOSIS — N2581 Secondary hyperparathyroidism of renal origin: Secondary | ICD-10-CM | POA: Diagnosis not present

## 2015-10-20 DIAGNOSIS — Z79899 Other long term (current) drug therapy: Secondary | ICD-10-CM

## 2015-10-20 DIAGNOSIS — A047 Enterocolitis due to Clostridium difficile: Secondary | ICD-10-CM | POA: Diagnosis not present

## 2015-10-20 DIAGNOSIS — Z8673 Personal history of transient ischemic attack (TIA), and cerebral infarction without residual deficits: Secondary | ICD-10-CM

## 2015-10-20 DIAGNOSIS — I493 Ventricular premature depolarization: Secondary | ICD-10-CM | POA: Diagnosis not present

## 2015-10-20 DIAGNOSIS — R7989 Other specified abnormal findings of blood chemistry: Secondary | ICD-10-CM | POA: Diagnosis not present

## 2015-10-20 DIAGNOSIS — I10 Essential (primary) hypertension: Secondary | ICD-10-CM | POA: Diagnosis not present

## 2015-10-20 DIAGNOSIS — I252 Old myocardial infarction: Secondary | ICD-10-CM | POA: Diagnosis present

## 2015-10-20 LAB — COMPREHENSIVE METABOLIC PANEL
ALK PHOS: 134 U/L — AB (ref 38–126)
ALT: 41 U/L (ref 17–63)
ANION GAP: 12 (ref 5–15)
AST: 93 U/L — ABNORMAL HIGH (ref 15–41)
Albumin: 2.6 g/dL — ABNORMAL LOW (ref 3.5–5.0)
BUN: 42 mg/dL — ABNORMAL HIGH (ref 6–20)
CALCIUM: 8.6 mg/dL — AB (ref 8.9–10.3)
CO2: 32 mmol/L (ref 22–32)
CREATININE: 6.33 mg/dL — AB (ref 0.61–1.24)
Chloride: 97 mmol/L — ABNORMAL LOW (ref 101–111)
GFR, EST AFRICAN AMERICAN: 10 mL/min — AB (ref >60)
GFR, EST NON AFRICAN AMERICAN: 8 mL/min — AB (ref >60)
Glucose, Bld: 134 mg/dL — ABNORMAL HIGH (ref 65–99)
Potassium: 5.6 mmol/L — ABNORMAL HIGH (ref 3.5–5.1)
Sodium: 141 mmol/L (ref 135–145)
Total Bilirubin: 8 mg/dL — ABNORMAL HIGH (ref 0.3–1.2)
Total Protein: 5.8 g/dL — ABNORMAL LOW (ref 6.5–8.1)

## 2015-10-20 LAB — GLUCOSE, CAPILLARY
GLUCOSE-CAPILLARY: 103 mg/dL — AB (ref 65–99)
Glucose-Capillary: 135 mg/dL — ABNORMAL HIGH (ref 65–99)

## 2015-10-20 LAB — RAPID INFLUENZA A&B ANTIGENS (ARMC ONLY)
INFLUENZA A (ARMC): NEGATIVE
INFLUENZA B (ARMC): NEGATIVE

## 2015-10-20 LAB — TROPONIN I
Troponin I: 4.56 ng/mL — ABNORMAL HIGH (ref <0.031)
Troponin I: 4.73 ng/mL — ABNORMAL HIGH (ref <0.031)
Troponin I: 3.36 ng/mL — ABNORMAL HIGH (ref <0.031)

## 2015-10-20 LAB — CBC
HCT: 35.1 % — ABNORMAL LOW (ref 40.0–52.0)
HEMOGLOBIN: 11.4 g/dL — AB (ref 13.0–18.0)
MCH: 29.7 pg (ref 26.0–34.0)
MCHC: 32.4 g/dL (ref 32.0–36.0)
MCV: 91.5 fL (ref 80.0–100.0)
Platelets: 118 10*3/uL — ABNORMAL LOW (ref 150–440)
RBC: 3.84 MIL/uL — AB (ref 4.40–5.90)
RDW: 15.7 % — ABNORMAL HIGH (ref 11.5–14.5)
WBC: 18.3 10*3/uL — AB (ref 3.8–10.6)

## 2015-10-20 LAB — LIPASE, BLOOD: LIPASE: 10 U/L — AB (ref 11–51)

## 2015-10-20 LAB — HEPARIN LEVEL (UNFRACTIONATED): Heparin Unfractionated: 0.18 IU/mL — ABNORMAL LOW (ref 0.30–0.70)

## 2015-10-20 LAB — MRSA PCR SCREENING: MRSA by PCR: NEGATIVE

## 2015-10-20 LAB — LACTIC ACID, PLASMA
LACTIC ACID, VENOUS: 2.2 mmol/L — AB (ref 0.5–2.0)
Lactic Acid, Venous: 2.3 mmol/L (ref 0.5–2.0)

## 2015-10-20 LAB — APTT: aPTT: 35 seconds (ref 24–36)

## 2015-10-20 LAB — PROTIME-INR
INR: 1.46
Prothrombin Time: 17.8 seconds — ABNORMAL HIGH (ref 11.4–15.0)

## 2015-10-20 LAB — BRAIN NATRIURETIC PEPTIDE: B Natriuretic Peptide: 4369 pg/mL — ABNORMAL HIGH (ref 0.0–100.0)

## 2015-10-20 MED ORDER — HEPARIN SODIUM (PORCINE) 5000 UNIT/ML IJ SOLN
INTRAMUSCULAR | Status: AC
  Filled 2015-10-20: qty 1

## 2015-10-20 MED ORDER — METRONIDAZOLE 500 MG PO TABS
500.0000 mg | ORAL_TABLET | Freq: Three times a day (TID) | ORAL | Status: DC
  Administered 2015-10-20 – 2015-10-21 (×3): 500 mg via ORAL
  Filled 2015-10-20 (×3): qty 1

## 2015-10-20 MED ORDER — SODIUM CHLORIDE 0.9 % IV BOLUS (SEPSIS)
500.0000 mL | Freq: Once | INTRAVENOUS | Status: AC
  Administered 2015-10-20: 500 mL via INTRAVENOUS

## 2015-10-20 MED ORDER — METOPROLOL TARTRATE 25 MG PO TABS
25.0000 mg | ORAL_TABLET | Freq: Two times a day (BID) | ORAL | Status: DC
  Administered 2015-10-20: 25 mg via ORAL
  Filled 2015-10-20 (×2): qty 1

## 2015-10-20 MED ORDER — INSULIN ASPART 100 UNIT/ML ~~LOC~~ SOLN
0.0000 [IU] | Freq: Three times a day (TID) | SUBCUTANEOUS | Status: DC
  Administered 2015-10-20 – 2015-10-23 (×2): 1 [IU] via SUBCUTANEOUS
  Filled 2015-10-20 (×2): qty 1

## 2015-10-20 MED ORDER — HEPARIN BOLUS VIA INFUSION
2500.0000 [IU] | Freq: Once | INTRAVENOUS | Status: AC
  Administered 2015-10-21: 2500 [IU] via INTRAVENOUS
  Filled 2015-10-20: qty 2500

## 2015-10-20 MED ORDER — LIDOCAINE-PRILOCAINE 2.5-2.5 % EX CREA
1.0000 "application " | TOPICAL_CREAM | CUTANEOUS | Status: DC | PRN
  Filled 2015-10-20: qty 5

## 2015-10-20 MED ORDER — ASPIRIN 81 MG PO CHEW
324.0000 mg | CHEWABLE_TABLET | Freq: Once | ORAL | Status: AC
  Administered 2015-10-20: 324 mg via ORAL
  Filled 2015-10-20: qty 4

## 2015-10-20 MED ORDER — ASPIRIN 81 MG PO CHEW: 81.0000 mg | CHEWABLE_TABLET | Freq: Every day | ORAL | Status: DC

## 2015-10-20 MED ORDER — HEPARIN BOLUS VIA INFUSION
4000.0000 [IU] | Freq: Once | INTRAVENOUS | Status: AC
  Administered 2015-10-20: 4000 [IU] via INTRAVENOUS
  Filled 2015-10-20: qty 4000

## 2015-10-20 MED ORDER — SODIUM CHLORIDE 0.9% FLUSH
3.0000 mL | Freq: Two times a day (BID) | INTRAVENOUS | Status: DC
  Administered 2015-10-20 – 2015-10-23 (×6): 3 mL via INTRAVENOUS

## 2015-10-20 MED ORDER — SEVELAMER CARBONATE 800 MG PO TABS
3200.0000 mg | ORAL_TABLET | Freq: Three times a day (TID) | ORAL | Status: DC
  Administered 2015-10-20 – 2015-10-23 (×6): 3200 mg via ORAL
  Filled 2015-10-20 (×3): qty 4
  Filled 2015-10-20: qty 3
  Filled 2015-10-20 (×4): qty 4

## 2015-10-20 MED ORDER — ASPIRIN EC 81 MG PO TBEC
81.0000 mg | DELAYED_RELEASE_TABLET | Freq: Every day | ORAL | Status: DC
  Administered 2015-10-21 – 2015-10-22 (×2): 81 mg via ORAL
  Filled 2015-10-20 (×2): qty 1

## 2015-10-20 MED ORDER — HEPARIN (PORCINE) IN NACL 100-0.45 UNIT/ML-% IJ SOLN
1650.0000 [IU]/h | INTRAMUSCULAR | Status: DC
  Administered 2015-10-20: 950 [IU]/h via INTRAVENOUS
  Administered 2015-10-21: 1350 [IU]/h via INTRAVENOUS
  Administered 2015-10-22: 1500 [IU]/h via INTRAVENOUS
  Filled 2015-10-20 (×6): qty 250

## 2015-10-20 MED ORDER — INSULIN ASPART 100 UNIT/ML ~~LOC~~ SOLN: 0.0000 [IU] | Freq: Every day | SUBCUTANEOUS | Status: DC

## 2015-10-20 NOTE — ED Notes (Signed)
Lab called with lactic acid of 2.3  Dr Burlene Arnt aware.

## 2015-10-20 NOTE — ED Notes (Signed)
MD at bedside. 

## 2015-10-20 NOTE — H&P (Signed)
[place] [facility] [place] [name]    MR#:  [id number]  DATE OF BIRTH:  1951/06/10  DATE OF ADMISSION:  10/20/2015  PRIMARY CARE PHYSICIAN: [name], NP   REQUESTING/REFERRING PHYSICIAN: Dr. Burlene Arnt  CHIEF COMPLAINT:   Chief Complaint  Patient presents with  . Nausea  . Emesis    HISTORY OF PRESENT ILLNESS:  Luis Walter  is a 65 y.o. male with a known history of hypertension, diabetes, incisional disease, CAD presents to the emergency room with 1 day of nausea, vomiting and diarrhea. No abdominal pain. Fever 100.6 in the emergency room with WBC count of 18,000. History of C. difficile 6 months back. No recent antibiotic use. Patient has had symptoms of cough and shortness of breath for 2 weeks which are unchanged. Clear sputum. No orthopnea or edema. Has had some confusion according to the wife which is improving. His troponin has been found to be elevated at 4.6. No chest pain. Last catheterization was in 2014 when medical management was advised for CAD.  PAST MEDICAL HISTORY:   Past Medical History  Diagnosis Date  . ESRD (end stage renal disease) ([place])     a. on HD M,W,F; b. previously on transplant list at [facility] - removed 2016  . DM2 (diabetes mellitus, type 2) ([place])   . CAD (coronary artery disease)     a. cardiac cath 2014: LM nl, mLAD 30%, dLAD 30%, ostLCx 50%, mid ramus 20% OM3 80% pRCA 20%, mRCA 20%, PDA 100% w/ left to right collats, medically managed   . Stroke ([place])   . Chronic systolic CHF (congestive heart failure) ([place])     a. echo 12/2014: EF 35-40%, mod diffuse HK, RWMA cannot be excluded, LV diastolic fxn parameters nl, mild MR, moderately dilated LA, mildly dilated RV internal cavity size, mild TR, PASP nl  . Gallstones   . Dialysis patient [facility])     PAST SURGICAL HISTORY:   Past Surgical History  Procedure Laterality Date  . Av fistula placement Left   . Colonoscopy with propofol N/A 05/31/2015     Procedure: COLONOSCOPY WITH PROPOFOL;  Surgeon: Lollie Sails, MD;  Location: [facility] ENDOSCOPY;  Service: Endoscopy;  Laterality: N/A;  . Esophagogastroduodenoscopy (egd) with propofol N/A 05/31/2015    Procedure: ESOPHAGOGASTRODUODENOSCOPY (EGD) WITH PROPOFOL;  Surgeon: Lollie Sails, MD;  Location: [facility] ENDOSCOPY;  Service: Endoscopy;  Laterality: N/A;    SOCIAL HISTORY:   Social History  Substance Use Topics  . Smoking status: Former Research scientist (life sciences)  . Smokeless tobacco: Not on file  . Alcohol Use: No    FAMILY HISTORY:   Family History  Problem Relation Age of Onset  . Hypertension    . Diabetes    . Diabetes Mother   . Diabetes Father   . Hypertension Father     DRUG ALLERGIES:  No Known Allergies  REVIEW OF SYSTEMS:   Review of Systems  Constitutional: Positive for fever and malaise/fatigue. Negative for chills and weight loss.  HENT: Negative for hearing loss and nosebleeds.   Eyes: Negative for blurred vision, double vision and pain.  Respiratory: Positive for cough and shortness of breath. Negative for hemoptysis, sputum production and wheezing.   Cardiovascular: Negative for chest pain, palpitations, orthopnea and leg swelling.  Gastrointestinal: Positive for nausea, vomiting and diarrhea. Negative for abdominal pain and constipation.  Genitourinary: Negative for dysuria and hematuria.  Musculoskeletal: Negative for myalgias, back pain and falls.  Skin:  Negative for rash.  Neurological: Positive for weakness. Negative for dizziness, tremors, sensory change, speech change, focal weakness, seizures and headaches.  Endo/Heme/Allergies: Does not bruise/bleed easily.  Psychiatric/Behavioral: Positive for memory loss. Negative for depression. The patient is not nervous/anxious.     MEDICATIONS AT HOME:   Prior to Admission medications   Medication Sig Start Date End Date Taking? Authorizing Provider  aspirin EC 81 MG tablet Take 1 tablet (81 mg total) by mouth  daily. 04/14/15   Wellington Hampshire, MD  atorvastatin (LIPITOR) 10 MG tablet Take 1 tablet (10 mg total) by mouth daily. 03/30/15   Areta Haber Dunn, PA-C  fluticasone (FLONASE) 50 MCG/ACT nasal spray Place 2 sprays into both nostrils daily. 07/07/15   Amy Overton Mam, NP  hydrALAZINE (APRESOLINE) 10 MG tablet Take 1 tablet (10 mg total) by mouth 3 (three) times daily. 03/30/15   Rise Mu, PA-C  isosorbide mononitrate (IMDUR) 30 MG 24 hr tablet Take 0.5 tablets (15 mg total) by mouth daily. 03/30/15   Areta Haber Dunn, PA-C  loratadine (CLARITIN) 10 MG tablet Take 1 tablet (10 mg total) by mouth daily. 07/07/15   Amy Overton Mam, NP  metoprolol tartrate (LOPRESSOR) 25 MG tablet Take 1 tablet (25 mg total) by mouth 2 (two) times daily. 01/01/15   Epifanio Lesches, MD  Misc. Devices (CANE) MISC 1 each by Does not apply route daily. Please get 3 prong or wide based cane for ambulation. 03/15/15   Amy Overton Mam, NP  nortriptyline (PAMELOR) 10 MG capsule Take 1 capsule at night for one week, then increase to 2 capsules at night and continue this dose. 03/17/15   Historical Provider, MD  pantoprazole (PROTONIX) 40 MG tablet Take by mouth. 05/31/15 05/30/16  Historical Provider, MD  sevelamer carbonate (RENVELA) 800 MG tablet Take 3,200 mg by mouth 3 (three) times daily with meals.    Historical Provider, MD  sucralfate (CARAFATE) 1 G tablet Take by mouth. 05/31/15 05/30/16  Historical Provider, MD     VITAL SIGNS:  Blood pressure 111/54, pulse 63, temperature 100.6 F (38.1 C), temperature source Oral, resp. rate 18, height 5\' 8"  (1.727 m), weight 81.647 kg (180 lb), SpO2 97 %.  PHYSICAL EXAMINATION:  Physical Exam  GENERAL:  65 y.o.-year-old patient lying in the bed with no acute distress.  EYES: Pupils equal, round, reactive to light and accommodation. No scleral icterus. Extraocular muscles intact.  HEENT: Head atraumatic, normocephalic. Oropharynx and nasopharynx clear. No oropharyngeal erythema, moist  oral mucosa  NECK:  Supple, no jugular venous distention. No thyroid enlargement, no tenderness.  LUNGS: Normal breath sounds bilaterally, no wheezing, rales, rhonchi. No use of accessory muscles of respiration.  CARDIOVASCULAR: S1, S2 normal. No murmurs, rubs, or gallops.  ABDOMEN: Soft, nontender, nondistended. Bowel sounds present. No organomegaly or mass.  EXTREMITIES: No pedal edema, cyanosis, or clubbing. + 2 pedal & radial pulses b/l.   NEUROLOGIC: Cranial nerves II through XII are intact. No focal Motor or sensory deficits appreciated b/l PSYCHIATRIC: The patient is alert and oriented x 3. Slow to answer SKIN: No obvious rash, lesion, or ulcer.  Left upper extremity AV graft  LABORATORY PANEL:   CBC  Recent Labs Lab 10/20/15 1008  WBC 18.3*  HGB 11.4*  HCT 35.1*  PLT 118*   ------------------------------------------------------------------------------------------------------------------  Chemistries   Recent Labs Lab 10/20/15 1008  NA 141  K 5.6*  CL 97*  CO2 32  GLUCOSE 134*  BUN 42*  CREATININE 6.33*  CALCIUM  8.6*  AST 93*  ALT 41  ALKPHOS 134*  BILITOT 8.0*   ------------------------------------------------------------------------------------------------------------------  Cardiac Enzymes  Recent Labs Lab 10/20/15 1008  TROPONINI 4.56*   ------------------------------------------------------------------------------------------------------------------  RADIOLOGY:  Dg Chest 2 View  10/20/2015  CLINICAL DATA:  Weakness in all extremities, cough, hypoxia, chills beginning yesterday after dialysis, vomited for 2 days, awake and alert, loose stools, end-stage renal disease, type II diabetes mellitus, stroke, CHF, former smoker, coronary artery disease EXAM: CHEST  2 VIEW COMPARISON:  05/03/2015 FINDINGS: Enlargement of cardiac silhouette with pulmonary vascular congestion. Mediastinal contours normal. Bronchitic changes with bibasilar atelectasis and  chronic accentuation of interstitial markings, stable. No definite acute failure or consolidation. No pleural effusion or pneumothorax. Bones demineralized. IMPRESSION: Enlargement of cardiac silhouette with pulmonary vascular congestion. Bronchitic changes with bibasilar atelectasis. Electronically Signed   By: Lavonia Dana M.D.   On: 10/20/2015 12:07     IMPRESSION AND PLAN:   * NSTEMI Troponin significantly elevated at 4.6. No chest pain but has had shortness of breath. We'll start on heparin drip. Consult cardiology. Discussed with [name]. Could be demand ischemia due to ongoing infection. We will need to trend troponins. Check echocardiogram. Aspirin, statin, beta blocker  * Nausea, vomiting and diarrhea History of C. difficile and being febrile now will start treatment with Flagyl. Check C. Difficile. No abdominal pain or tenderness on examination. Further management as per test results and progress.  * Acute mild encephalopathy seems to have resolved. Likely from infection  * Acute on chronic systolic congestive heart failure with acute hypoxic respiratory failure Patient desatted into 88% but his oxygen was turned off. We will consult nephrology for hemodialysis. He did receive 500 mL bolus in the emergency room due to elevated lactic acid.  * End-stage renal disease on hemodialysis Consult nephrology.  * Hypertension Continue home medications  * Diabetes mellitus Sliding scale insulin  * DVT prophylaxis Patient is being started on heparin drip  All the records are reviewed and case discussed with ED provider. Management plans discussed with the patient, family and they are in agreement.  CODE STATUS: FULL  TOTAL CC TIME TAKING CARE OF THIS PATIENT: 40 minutes.   Hillary Bow R M.D on 10/20/2015 at 1:50 PM  Between 7am to 6pm - Pager - [phone]  After 6pm go to [email or web address] - password [email or web address] [facility]  [facility]  Office   [phone]  CC: Primary care physician; Amy L Krebs, NP  Note: This dictation was prepared with Dragon dictation along with smaller phrase technology. Any transcriptional errors that result from this process are unintentional.

## 2015-10-20 NOTE — ED Notes (Signed)
Upon initial assessment, on RA, sats 84%, pt acting mildly confused, NIH negative, however, pt weak all extremities. Wife at bedside states pt is on RA at home, still makes urine in small amounts. [place] applied at 5L, sats 95% with encouraged deep breathing.

## 2015-10-20 NOTE — ED Notes (Signed)
Sats maintaining at 95-98%, O2 now at 4L, pt tolerating well.

## 2015-10-20 NOTE — Progress Notes (Signed)
ANTICOAGULATION CONSULT NOTE - Initial Consult  Pharmacy Consult for Heparin Indication: chest pain/ACS  No Known Allergies  Patient Measurements: Height: 5\' 8"  (172.7 cm) Weight: 180 lb (81.647 kg) IBW/kg (Calculated) : 68.4 Heparin Dosing Weight: 81.6 kg  Vital Signs: Temp: 100.6 F (38.1 C) (03/02 1045) Temp Source: Oral (03/02 1045) BP: 111/54 mmHg (03/02 1300) Pulse Rate: 63 (03/02 1300)  Labs:  Recent Labs  10/20/15 1008  HGB 11.4*  HCT 35.1*  PLT 118*  APTT 35  LABPROT 17.8*  INR 1.46  CREATININE 6.33*  TROPONINI 4.56*    Estimated Creatinine Clearance: 11.7 mL/min (by C-G formula based on Cr of 6.33).     Assessment: 65 yo male with PMH including CAD here with elevated troponin starting on heparin drip. Of note pt with ESRD on HD. No anticoagulants noted PTA.  Hgb 11.4, Plt 118 - Spoke with Dr. Darvin Neighbours, ok to start heparin drip with plt count of 118 INR 1.46, aPTT 35   Goal of Therapy:  Heparin level 0.3-0.7 units/ml Monitor platelets by anticoagulation protocol: Yes   Plan:  Heparin 4000 units IV x1 bolus then drip at 950 units/hr (=9.5 ml/hr) Anti-Xa level in 8h - at 2300 CBC in AM - will need to continue to closely monitor plt count  Rayna Sexton L 10/20/2015,2:07 PM

## 2015-10-20 NOTE — ED Notes (Signed)
Pt arrives to room 12  - float RN assessing him at this time

## 2015-10-20 NOTE — ED Notes (Signed)
Lab calle with troponin of 4.56   Dr Burlene Arnt aware.

## 2015-10-20 NOTE — Progress Notes (Signed)
Subjective:  Presents for vomiting that started at dialysis yesterday Cold chills at home  Loose stools at home Poor appetite and vomiting x 1-2 days Noted to have low grade temp here    Objective:  Vital signs in last 24 hours:  Temp:  [98.8 F (37.1 C)-100.6 F (38.1 C)] 100.6 F (38.1 C) (03/02 1045) Pulse Rate:  [63-139] 139 (03/02 1540) Resp:  [18] 18 (03/02 1000) BP: (96-114)/(45-65) 96/45 mmHg (03/02 1540) SpO2:  [89 %-97 %] 96 % (03/02 1430) Weight:  [81.647 kg (180 lb)] 81.647 kg (180 lb) (03/02 1000)  Weight change:  Filed Weights   10/20/15 1000  Weight: 81.647 kg (180 lb)    Intake/Output:   No intake or output data in the 24 hours ending 10/20/15 1757   Physical Exam: General: NAD, sick appearing  HEENT Muddy sclera  Neck supple  Pulm/lungs clear  CVS/Heart No rub  Abdomen:  Soft, NT  Extremities: No edema  Neurologic: Alert, oreinted  Skin: No rashes  Access: Left forearm AVF       Basic Metabolic Panel:   Recent Labs Lab 10/20/15 1008  NA 141  K 5.6*  CL 97*  CO2 32  GLUCOSE 134*  BUN 42*  CREATININE 6.33*  CALCIUM 8.6*     CBC:  Recent Labs Lab 10/20/15 1008  WBC 18.3*  HGB 11.4*  HCT 35.1*  MCV 91.5  PLT 118*      Microbiology:  Recent Results (from the past 720 hour(s))  Rapid Influenza A&B Antigens ([facility] only)     Status: None   Collection Time: 10/20/15 11:38 AM  Result Value Ref Range Status   Influenza A ([facility]) NEGATIVE NEGATIVE Final   Influenza B ([facility]) NEGATIVE NEGATIVE Final  MRSA PCR Screening     Status: None   Collection Time: 10/20/15  4:29 PM  Result Value Ref Range Status   MRSA by PCR NEGATIVE NEGATIVE Final    Comment:        The GeneXpert MRSA Assay (FDA approved for NASAL specimens only), is one component of a comprehensive MRSA colonization surveillance program. It is not intended to diagnose MRSA infection nor to guide or monitor treatment for MRSA infections.     Coagulation  Studies:  Recent Labs  10/20/15 1008  LABPROT 17.8*  INR 1.46    Urinalysis: No results for input(s): COLORURINE, LABSPEC, PHURINE, GLUCOSEU, HGBUR, BILIRUBINUR, KETONESUR, PROTEINUR, UROBILINOGEN, NITRITE, LEUKOCYTESUR in the last 72 hours.  Invalid input(s): APPERANCEUR    Imaging: Dg Chest 2 View  10/20/2015  CLINICAL DATA:  Weakness in all extremities, cough, hypoxia, chills beginning yesterday after dialysis, vomited for 2 days, awake and alert, loose stools, end-stage renal disease, type II diabetes mellitus, stroke, CHF, former smoker, coronary artery disease EXAM: CHEST  2 VIEW COMPARISON:  05/03/2015 FINDINGS: Enlargement of cardiac silhouette with pulmonary vascular congestion. Mediastinal contours normal. Bronchitic changes with bibasilar atelectasis and chronic accentuation of interstitial markings, stable. No definite acute failure or consolidation. No pleural effusion or pneumothorax. Bones demineralized. IMPRESSION: Enlargement of cardiac silhouette with pulmonary vascular congestion. Bronchitic changes with bibasilar atelectasis. Electronically Signed   By: Lavonia Dana M.D.   On: 10/20/2015 12:07     Medications:   . heparin 950 Units/hr (10/20/15 1440)   . [START ON 10/21/2015] aspirin EC  81 mg Oral QHS  . heparin      . insulin aspart  0-5 Units Subcutaneous QHS  . insulin aspart  0-9 Units Subcutaneous TID WC  .  metoprolol tartrate  25 mg Oral BID  . metroNIDAZOLE  500 mg Oral 3 times per day  . sevelamer carbonate  3,200 mg Oral TID WC  . sodium chloride flush  3 mL Intravenous Q12H     Assessment/ Plan:  65 y.o.AA male with ESRD, Dialysis 2007-2008, DM-2, HTN, CVA, CAD  1. ESRD/ [facility]/ MWF 2. Hyperkalemia 3. AOCKD 4. SHPTH 5. NSTEMI Plan: Dialysis today with low K bath Monitor Hgb and Phos     LOS: 0 [facility] 3/2/20175:57 PM

## 2015-10-20 NOTE — ED Notes (Signed)
Wife remains at bedside. Pt states he is cold, denies any other complaints.

## 2015-10-20 NOTE — ED Notes (Signed)
Pt c/o N/V for the past couple of days.. Denies any pain at present.. States he is a dialysis pt.

## 2015-10-20 NOTE — ED Notes (Signed)
States chills that began yesterday after dialysis, states vomiting for 2 days now, pt awake and alert, loose stools

## 2015-10-20 NOTE — ED Notes (Signed)
Report called to abigail rn on 2nd floor .

## 2015-10-20 NOTE — ED Notes (Signed)
Pt alert.  Iv fluids infused.  Family with pt.  Skin warm and dry.  No acute distress

## 2015-10-20 NOTE — Progress Notes (Signed)
ANTICOAGULATION CONSULT NOTE - Initial Consult  Pharmacy Consult for Heparin Indication: chest pain/ACS  No Known Allergies  Patient Measurements: Height: 5\' 8"  (172.7 cm) Weight: 188 lb 11.4 oz (85.6 kg) IBW/kg (Calculated) : 68.4 Heparin Dosing Weight: 81.6 kg  Vital Signs: Temp: 100.5 F (38.1 C) (03/02 2115) Temp Source: Oral (03/02 2115) BP: 114/64 mmHg (03/02 2300) Pulse Rate: 91 (03/02 2300)  Labs:  Recent Labs  10/20/15 1008 10/20/15 1440 10/20/15 2122  HGB 11.4*  --   --   HCT 35.1*  --   --   PLT 118*  --   --   APTT 35  --   --   LABPROT 17.8*  --   --   INR 1.46  --   --   HEPARINUNFRC  --   --  0.18*  CREATININE 6.33*  --   --   TROPONINI 4.56* 4.73* 3.36*    Estimated Creatinine Clearance: 12.9 mL/min (by C-G formula based on Cr of 6.33).     Assessment: 66 yo male with PMH including CAD here with elevated troponin starting on heparin drip. Of note pt with ESRD on HD. No anticoagulants noted PTA.  Hgb 11.4, Plt 118 - Spoke with Dr. Darvin Neighbours, ok to start heparin drip with plt count of 118 INR 1.46, aPTT 35   Goal of Therapy:  Heparin level 0.3-0.7 units/ml Monitor platelets by anticoagulation protocol: Yes   Plan:  Heparin level subtherapeutic x 1. 2500 unit IV x 1 bolus and increase rate to 1200 units/hr. Will recheck level in 8 hours.  Laural Benes, Pharm.D., BCPS Clinical Pharmacist 10/20/2015,11:11 PM

## 2015-10-20 NOTE — Progress Notes (Signed)
md was made aware of pt's lactic acid level of 2.2

## 2015-10-20 NOTE — ED Provider Notes (Addendum)
[facility] Emergency Department Provider Note  ____________________________________________   I have reviewed the triage vital signs and the nursing notes.   HISTORY  Chief Complaint Nausea and Emesis    HPI Luis Walter is a 65 y.o. male with a history of multiple different medical problems including CAD, end-stage renal disease on dialysis which he had fully yesterday, TIA, C. difficile several months ago, CHF, generalized weakness, presents today complaining of feeling weak and slightly confused. He has had nausea vomiting diarrhea since last night. He did have his dialysis yesterday which was completed. He has had a cough he states "for a while" but no significant productive cough. Did not of a fever until today. Did have chills after dialysis. Has been vomiting and having diarrhea since then. His wife in the room states that he has been slightly confused today she states compared to his baseline. The patient has had no hematemesis no bright red blood per rectum, no melena. He has had no abdominal pain. He denies headache or stiff neck, he denies rash. He does not having indwelling lines. Patient states he is not sure if he goes flu shot but the wife believes he did get warm. Nothing seems to make his symptoms better nothing seems to make his symptoms worse.  Past Medical History  Diagnosis Date  . ESRD (end stage renal disease) ([place])     a. on HD M,W,F; b. previously on transplant list at [facility] - removed 2016  . DM2 (diabetes mellitus, type 2) ([place])   . CAD (coronary artery disease)     a. cardiac cath 2014: LM nl, mLAD 30%, dLAD 30%, ostLCx 50%, mid ramus 20% OM3 80% pRCA 20%, mRCA 20%, PDA 100% w/ left to right collats, medically managed   . Stroke ([place])   . Chronic systolic CHF (congestive heart failure) ([place])     a. echo 12/2014: EF 35-40%, mod diffuse HK, RWMA cannot be excluded, LV diastolic fxn parameters nl, mild MR, moderately dilated LA, mildly dilated  RV internal cavity size, mild TR, PASP nl  . Gallstones   . Dialysis patient [facility])     Patient Active Problem List   Diagnosis Date Noted  . Allergic rhinitis 07/07/2015  . CAD in native artery 05/02/2015  . Palpitations 03/30/2015  . Neck pain 03/30/2015  . Cervical pain 03/30/2015  . Cephalalgia 03/17/2015  . Type 2 diabetes, controlled, with renal manifestation ([place]) 03/15/2015  . Chronic kidney disease requiring chronic dialysis ([place]) 03/15/2015  . Benign hypertension with ESRD (end-stage renal disease) ([place]) 03/15/2015  . Generalized weakness 03/15/2015  . Elevated troponin 01/23/2015  . Chronic systolic CHF (congestive heart failure) ([place])   . CAD (coronary artery disease)   . Pneumonia 12/25/2014  . ESRD (end stage renal disease) ([place]) 12/25/2014  . History of CVA (cerebrovascular accident) 12/25/2014  . Personal history of transient ischemic attack (TIA) and cerebral infarction without residual deficit 12/25/2014  . Personal history of transient ischemic attack (TIA), and cerebral infarction without residual deficits 12/25/2014  . Abnormal echocardiogram 12/25/2012  . Awaiting organ transplant status 08/26/2012    Past Surgical History  Procedure Laterality Date  . Av fistula placement Left   . Colonoscopy with propofol N/A 05/31/2015    Procedure: COLONOSCOPY WITH PROPOFOL;  Surgeon: Lollie Sails, MD;  Location: [facility] ENDOSCOPY;  Service: Endoscopy;  Laterality: N/A;  . Esophagogastroduodenoscopy (egd) with propofol N/A 05/31/2015    Procedure: ESOPHAGOGASTRODUODENOSCOPY (EGD) WITH PROPOFOL;  Surgeon: Lollie Sails,  MD;  Location: [facility] ENDOSCOPY;  Service: Endoscopy;  Laterality: N/A;    Current Outpatient Rx  Name  Route  Sig  Dispense  Refill  . aspirin EC 81 MG tablet   Oral   Take 1 tablet (81 mg total) by mouth daily.   90 tablet   3   . atorvastatin (LIPITOR) 10 MG tablet   Oral   Take 1 tablet (10 mg total) by mouth daily.   90 tablet   3    . fluticasone (FLONASE) 50 MCG/ACT nasal spray   Each Nare   Place 2 sprays into both nostrils daily.   16 g   11   . hydrALAZINE (APRESOLINE) 10 MG tablet   Oral   Take 1 tablet (10 mg total) by mouth 3 (three) times daily.   90 tablet   6   . isosorbide mononitrate (IMDUR) 30 MG 24 hr tablet   Oral   Take 0.5 tablets (15 mg total) by mouth daily.   30 tablet   3   . loratadine (CLARITIN) 10 MG tablet   Oral   Take 1 tablet (10 mg total) by mouth daily.   30 tablet   11     OTC   . metoprolol tartrate (LOPRESSOR) 25 MG tablet   Oral   Take 1 tablet (25 mg total) by mouth 2 (two) times daily.   60 tablet   0   . Misc. Devices (CANE) MISC   Does not apply   1 each by Does not apply route daily. Please get 3 prong or wide based cane for ambulation.   1 each   0   . nortriptyline (PAMELOR) 10 MG capsule      Take 1 capsule at night for one week, then increase to 2 capsules at night and continue this dose.         . pantoprazole (PROTONIX) 40 MG tablet   Oral   Take by mouth.         . sevelamer carbonate (RENVELA) 800 MG tablet   Oral   Take 3,200 mg by mouth 3 (three) times daily with meals.         . sucralfate (CARAFATE) 1 G tablet   Oral   Take by mouth.           Allergies Review of patient's allergies indicates no known allergies.  Family History  Problem Relation Age of Onset  . Hypertension    . Diabetes    . Diabetes Mother   . Diabetes Father   . Hypertension Father     Social History Social History  Substance Use Topics  . Smoking status: Former Research scientist (life sciences)  . Smokeless tobacco: None  . Alcohol Use: No    Review of Systems Constitutional: + fever/chills Eyes: No visual changes. ENT: No sore throat. No stiff neck no neck pain Cardiovascular: Denies chest pain.  Respiratory: Denies shortness of breath.+ cough Gastrointestinal:   +vomiting.  +diarrhea.  No constipation. Genitourinary: Negative for dysuria. Only makes  occasional urine Musculoskeletal: Negative lower extremity swelling Skin: Negative for rash. Neurological: Negative for headaches, focal weakness or numbness. 10-point ROS otherwise negative.  ____________________________________________   PHYSICAL EXAM:  VITAL SIGNS: ED Triage Vitals  Enc Vitals Group     BP 10/20/15 1000 107/53 mmHg     Pulse Rate 10/20/15 1000 71     Resp 10/20/15 1000 18     Temp 10/20/15 1000 98.8 F (37.1 C)  Temp Source 10/20/15 1000 Oral     SpO2 10/20/15 1000 89 %     Weight 10/20/15 1000 180 lb (81.647 kg)     Height 10/20/15 1000 5\' 8"  (1.727 m)     Head Cir --      Peak Flow --      Pain Score --      Pain Loc --      Pain Edu? --      Excl. in [place]? --     Constitutional: Alert and oriented in nad. Patient appears somewhat fatigued but nontoxic. Eyes: Conjunctivae are normal. PERRL. EOMI. Head: Atraumatic. Nose: No congestion/rhinnorhea. Mouth/Throat: Mucous membranes are moist.  Oropharynx non-erythematous. Neck: No stridor.   Nontender with no meningismus Cardiovascular: Normal rate, regular rhythm. Grossly normal heart sounds.  Good peripheral circulation. Respiratory: Normal respiratory effort.  No retractions. Diminishment of breath sounds bibasilar Abdominal: Soft and nontender. No distention. No guarding no rebound Back:  There is no focal tenderness or step off there is no midline tenderness there are no lesions noted. there is no CVA tenderness Musculoskeletal: No lower extremity tenderness. No joint effusions, no DVT signs strong distal pulses no edema + fistula not infected in appearance + thrill +bruit Neurologic:  Normal speech and language. No gross focal neurologic deficits are appreciated.  Skin:  Skin is warm, dry and intact. No rash noted. Psychiatric: Mood and affect are normal. Speech and behavior are normal.  ____________________________________________   LABS (all labs ordered are listed, but only abnormal results  are displayed)  Labs Reviewed  LIPASE, BLOOD - Abnormal; Notable for the following:    Lipase 10 (*)    All other components within normal limits  COMPREHENSIVE METABOLIC PANEL - Abnormal; Notable for the following:    Potassium 5.6 (*)    Chloride 97 (*)    Glucose, Bld 134 (*)    BUN 42 (*)    Creatinine, Ser 6.33 (*)    Calcium 8.6 (*)    Total Protein 5.8 (*)    Albumin 2.6 (*)    AST 93 (*)    Alkaline Phosphatase 134 (*)    Total Bilirubin 8.0 (*)    GFR calc non Af Amer 8 (*)    GFR calc Af Amer 10 (*)    All other components within normal limits  CBC - Abnormal; Notable for the following:    WBC 18.3 (*)    RBC 3.84 (*)    Hemoglobin 11.4 (*)    HCT 35.1 (*)    RDW 15.7 (*)    Platelets 118 (*)    All other components within normal limits  BRAIN NATRIURETIC PEPTIDE - Abnormal; Notable for the following:    B Natriuretic Peptide 4369.0 (*)    All other components within normal limits  RAPID INFLUENZA A&B ANTIGENS ([facility] ONLY)  CULTURE, BLOOD (ROUTINE X 2)  CULTURE, BLOOD (ROUTINE X 2)  C DIFFICILE QUICK SCREEN W PCR REFLEX  URINALYSIS COMPLETEWITH MICROSCOPIC ([facility] ONLY)  LACTIC ACID, PLASMA  LACTIC ACID, PLASMA  TROPONIN I   ____________________________________________  EKG  I personally interpreted any EKGs ordered by me or triage Sinus rhythm, rate 71 bpm, no acute ST elevation or acute ST depression ossific ST changes including T-wave flattening diffusely, poor progression suggestive of possible old anterior infarct. No acute ischemia ____________________________________________  RADIOLOGY  I reviewed any imaging ordered by me or triage that were performed during my shift ____________________________________________   PROCEDURES  Procedure(s) performed: None  Critical Care performed: CRITICAL CARE Performed by: Schuyler Amor   Total critical care time: 40 minutes  Critical care time was exclusive of separately billable procedures and  treating other patients.  Critical care was necessary to treat or prevent imminent or life-threatening deterioration.  Critical care was time spent personally by me on the following activities: development of treatment plan with patient and/or surrogate as well as nursing, discussions with consultants, evaluation of patient's response to treatment, examination of patient, obtaining history from patient or surrogate, ordering and performing treatments and interventions, ordering and review of laboratory studies, ordering and review of radiographic studies, pulse oximetry and re-evaluation of patient's condition.   ____________________________________________   INITIAL IMPRESSION / ASSESSMENT AND PLAN / ED COURSE  Pertinent labs & imaging results that were available during my care of the patient were reviewed by me and considered in my medical decision making (see chart for details).  Patient with nausea vomiting diarrhea, fever, dialysis patient, and some lethargy and confusion according to wife. Patient is in no acute medical distress at this time he does appear to be somewhat fatigued but nontoxic. I am concerned however about his fever, and his confusion. His oxygen saturations are in the low 90s. He is not on home oxygen. Patient does have some evidence of CHF on chest x-ray so we stopped his fluids after 500 cc. At this time I do not see an obvious source of infection, and this certainly could be a viral gastroenteritis and I will therefore hold off on antibiotics temporarily but patient will need to be admitted for further observation.  ----------------------------------------- 1:12 PM on 10/20/2015 -----------------------------------------  She denies chest pain or shortness of breath and he has no chest pain at this time however, patient does have an elevated troponin. Given renal insufficiency, this certainly can be at some was a true minimal to poor clearance however the levels which  are the mom expect for that. We will give him aspirin. Given that there is no ongoing chest pain and no evidence of an ST elevation myocardial infarction at this time, patient does not require emergent trip to the Cath Lab. Hospitalist will evaluate for further intervention as needed. last cardiology note: "65 year old male with history of CAD managed medically with total PDA occlusion by cath 2014"  ____________________________________________   FINAL CLINICAL IMPRESSION(S) / ED DIAGNOSES  Final diagnoses:  None      This chart was dictated using voice recognition software.  Despite best efforts to proofread,  errors can occur which can change meaning.     Schuyler Amor, MD 10/20/15 [place], MD 10/20/15 1313  Schuyler Amor, MD 10/20/15 [place], MD 10/20/15 [phone]

## 2015-10-20 NOTE — ED Notes (Signed)
Patient is resting comfortably. 

## 2015-10-20 NOTE — ED Notes (Signed)
Patient transported to X-ray 

## 2015-10-21 ENCOUNTER — Encounter: Payer: Self-pay | Admitting: Student

## 2015-10-21 DIAGNOSIS — I214 Non-ST elevation (NSTEMI) myocardial infarction: Secondary | ICD-10-CM

## 2015-10-21 DIAGNOSIS — I5022 Chronic systolic (congestive) heart failure: Secondary | ICD-10-CM

## 2015-10-21 DIAGNOSIS — I25110 Atherosclerotic heart disease of native coronary artery with unstable angina pectoris: Secondary | ICD-10-CM

## 2015-10-21 LAB — BASIC METABOLIC PANEL
ANION GAP: 9 (ref 5–15)
BUN: 36 mg/dL — ABNORMAL HIGH (ref 6–20)
CALCIUM: 8.4 mg/dL — AB (ref 8.9–10.3)
CO2: 32 mmol/L (ref 22–32)
Chloride: 100 mmol/L — ABNORMAL LOW (ref 101–111)
Creatinine, Ser: 4.9 mg/dL — ABNORMAL HIGH (ref 0.61–1.24)
GFR, EST AFRICAN AMERICAN: 13 mL/min — AB (ref >60)
GFR, EST NON AFRICAN AMERICAN: 12 mL/min — AB (ref >60)
Glucose, Bld: 75 mg/dL (ref 65–99)
Potassium: 4.5 mmol/L (ref 3.5–5.1)
SODIUM: 141 mmol/L (ref 135–145)

## 2015-10-21 LAB — HEPARIN LEVEL (UNFRACTIONATED)
Heparin Unfractionated: 0.27 IU/mL — ABNORMAL LOW (ref 0.30–0.70)
Heparin Unfractionated: 0.16 IU/mL — ABNORMAL LOW (ref 0.30–0.70)

## 2015-10-21 LAB — GLUCOSE, CAPILLARY
GLUCOSE-CAPILLARY: 107 mg/dL — AB (ref 65–99)
GLUCOSE-CAPILLARY: 100 mg/dL — AB (ref 65–99)
Glucose-Capillary: 82 mg/dL (ref 65–99)
Glucose-Capillary: 86 mg/dL (ref 65–99)
Glucose-Capillary: 96 mg/dL (ref 65–99)

## 2015-10-21 LAB — CBC
HCT: 31.7 % — ABNORMAL LOW (ref 40.0–52.0)
HEMOGLOBIN: 10.4 g/dL — AB (ref 13.0–18.0)
MCH: 29.8 pg (ref 26.0–34.0)
MCHC: 32.7 g/dL (ref 32.0–36.0)
MCV: 91.3 fL (ref 80.0–100.0)
PLATELETS: 113 10*3/uL — AB (ref 150–440)
RBC: 3.47 MIL/uL — AB (ref 4.40–5.90)
RDW: 15.6 % — AB (ref 11.5–14.5)
WBC: 11.6 10*3/uL — ABNORMAL HIGH (ref 3.8–10.6)

## 2015-10-21 LAB — LIPID PANEL
CHOL/HDL RATIO: 3.5 ratio
Cholesterol: 102 mg/dL (ref 0–200)
HDL: 29 mg/dL — AB (ref >40)
LDL Cholesterol: 55 mg/dL (ref 0–99)
Triglycerides: 88 mg/dL (ref <150)
VLDL: 18 mg/dL (ref 0–40)

## 2015-10-21 LAB — C DIFFICILE QUICK SCREEN W PCR REFLEX
C DIFFICILE (CDIFF) INTERP: NEGATIVE
C DIFFICILE (CDIFF) TOXIN: NEGATIVE
C Diff antigen: NEGATIVE

## 2015-10-21 LAB — TROPONIN I: Troponin I: 3.18 ng/mL — ABNORMAL HIGH (ref <0.031)

## 2015-10-21 MED ORDER — METOPROLOL TARTRATE 25 MG PO TABS
12.5000 mg | ORAL_TABLET | Freq: Two times a day (BID) | ORAL | Status: DC
  Administered 2015-10-21 – 2015-10-22 (×2): 25 mg via ORAL
  Administered 2015-10-22 – 2015-10-23 (×2): 12.5 mg via ORAL
  Filled 2015-10-21 (×4): qty 1

## 2015-10-21 MED ORDER — ATORVASTATIN CALCIUM 20 MG PO TABS
40.0000 mg | ORAL_TABLET | Freq: Every day | ORAL | Status: DC
  Administered 2015-10-21 – 2015-10-22 (×2): 40 mg via ORAL
  Filled 2015-10-21 (×2): qty 2

## 2015-10-21 MED ORDER — PIPERACILLIN-TAZOBACTAM 3.375 G IVPB 30 MIN
3.3750 g | Freq: Two times a day (BID) | INTRAVENOUS | Status: DC
  Administered 2015-10-21 – 2015-10-23 (×4): 3.375 g via INTRAVENOUS
  Filled 2015-10-21 (×7): qty 50

## 2015-10-21 MED ORDER — HEPARIN BOLUS VIA INFUSION
2400.0000 [IU] | Freq: Once | INTRAVENOUS | Status: AC
  Administered 2015-10-21: 2400 [IU] via INTRAVENOUS
  Filled 2015-10-21: qty 2400

## 2015-10-21 MED ORDER — HEPARIN BOLUS VIA INFUSION
1200.0000 [IU] | Freq: Once | INTRAVENOUS | Status: AC
  Administered 2015-10-21: 1200 [IU] via INTRAVENOUS
  Filled 2015-10-21: qty 1200

## 2015-10-21 MED ORDER — NEPRO/CARBSTEADY PO LIQD: 237.0000 mL | ORAL | Status: DC | PRN

## 2015-10-21 MED ORDER — NEPRO/CARBSTEADY PO LIQD
237.0000 mL | Freq: Two times a day (BID) | ORAL | Status: DC
  Administered 2015-10-23: 237 mL via ORAL

## 2015-10-21 NOTE — Consult Note (Signed)
Cardiology Consult    Patient ID: Luis Walter MRN: [id number], DOB/AGE: 63-Oct-1952   Admit date: 10/20/2015 Date of Consult: 10/21/2015  Primary Physician: [name], NP Reason for Consult: Elevated Troponin Primary Cardiologist: Dr. Fletcher Anon Requesting Provider: Dr. Darvin Neighbours   History of Present Illness    Luis Walter is a 65 y.o. male with past medical history of CAD (cath in 2014 with 100% total occlusion of PDA, normal LM, 30% mLAD, 30% dLAD, 50% LCx, 20% mid-Ramus, 80% OM3, 20% pRCA and mRCA), Chronic systolic CHF (EF 123456 by echo in 12/2014), Type 2 DM, and ESRD (on HD MWF) who presented to [facility] on 10/20/2015 for nausea, vomiting, diarrhea, and weakness.  The patient and his wife report he had a cold for the past two weeks with a cough and runny nose. He did not require Abx treatment during this course. Starting Wednesday afternoon, he developed vomiting and diarrhea following his HD session. He denies any associated hematemesis, melena, or hematochezia.   The patient denies any recent chest pain or dyspnea on exertion. Reports having mild dyspnea at rest and with exertion during his recent cold but no symptoms prior to that or currently.   In the ED, he had a fever of 100.6. CBC with WBC of 18.3. Hgb 11.4 Platelets 118. K+ 5.6. Creatinine 6.33. Calcium 8.6. AST 93. ALT 41. Lipase 10. Lactic Acid 2.3. CXR showed enlargement of the cardiac silhouette with pulmonary vascular congestion. Bronchitic changes with bibasilar atelectasis were noted as well. Cyclic troponin values have been 4.56, 4.73, 3.36, and 3.18. EKG shows sinus arrhythmia with HR of 71. No acute ST or T-wave changes.   While in the ED, he was given a fluid bolus due to his elevated Lactic Acid and desaturated into the high-80's on room air. He was placed on 4L [place] at that time with improvement in his symptoms. He is currently stating appropriately on room air and does not appear in distress. His wife reports he  underwent HD last night due to his hyperkalemia at time of presentation.   Last seen by Dr. Fletcher Anon in 03/2015 and was doing well at that time. Was on Lipitor 10mg  daily, Hydralazine 10mg  TID, Imdur 15mg  daily, and Lopressor 25mg  BID. None of his medications were titrated at the time of that visit due to reported hypotension during HD. His wife reports he has not been on any of the above medications since before Christmas of 2016, for he kept experiencing hypotension in dialysis with SBP's in the 80's - 90's. She reports the only thing he currently takes is 81mg  ASA and Renvela.  In reviewing records, it appears his last NST was in 01/2014 and showed severe hypokinesis of the inferior wall and mild hypokinesis of the lateral wall. No ischemic evaluation since then.   Past Medical History   Past Medical History  Diagnosis Date  . ESRD (end stage renal disease) ([place])     a. on HD M,W,F; b. previously on transplant list at [facility] - removed 2016  . DM2 (diabetes mellitus, type 2) ([place])   . CAD (coronary artery disease)     a. cardiac cath 2014: LM nl, mLAD 30%, dLAD 30%, ostLCx 50%, mid ramus 20% OM3 80% pRCA 20%, mRCA 20%, PDA 100% w/ left to right collats, medically managed   . Stroke ([place])   . Chronic systolic CHF (congestive heart failure) ([place])     a. echo 12/2014: EF 35-40%, mod diffuse HK, RWMA cannot  be excluded, LV diastolic fxn parameters nl, mild MR, moderately dilated LA, mildly dilated RV internal cavity size, mild TR, PASP nl  . Gallstones   . Dialysis patient [facility])     Past Surgical History  Procedure Laterality Date  . Av fistula placement Left   . Colonoscopy with propofol N/A 05/31/2015    Procedure: COLONOSCOPY WITH PROPOFOL;  Surgeon: Lollie Sails, MD;  Location: [facility] ENDOSCOPY;  Service: Endoscopy;  Laterality: N/A;  . Esophagogastroduodenoscopy (egd) with propofol N/A 05/31/2015    Procedure: ESOPHAGOGASTRODUODENOSCOPY (EGD) WITH PROPOFOL;  Surgeon: Lollie Sails,  MD;  Location: [facility] ENDOSCOPY;  Service: Endoscopy;  Laterality: N/A;     Allergies  No Known Allergies  Inpatient Medications    . aspirin EC  81 mg Oral QHS  . insulin aspart  0-5 Units Subcutaneous QHS  . insulin aspart  0-9 Units Subcutaneous TID WC  . metoprolol tartrate  25 mg Oral BID  . metroNIDAZOLE  500 mg Oral 3 times per day  . sevelamer carbonate  3,200 mg Oral TID WC  . sodium chloride flush  3 mL Intravenous Q12H    Family History    Family History  Problem Relation Age of Onset  . Hypertension    . Diabetes    . Diabetes Mother   . Diabetes Father   . Hypertension Father     Social History    Social History   Social History  . Marital Status: Married    Spouse Name: N/A  . Number of Children: N/A  . Years of Education: N/A   Occupational History  . Not on file.   Social History Main Topics  . Smoking status: Former Research scientist (life sciences)  . Smokeless tobacco: Not on file  . Alcohol Use: No  . Drug Use: No  . Sexual Activity: Not on file   Other Topics Concern  . Not on file   Social History Narrative     Review of Systems    General:  No chills, fever, night sweats or weight changes.  Cardiovascular:  No chest pain, dyspnea on exertion, edema, orthopnea, palpitations, paroxysmal nocturnal dyspnea. Dermatological: No rash, lesions/masses Respiratory: No cough, dyspnea Urologic: No hematuria, dysuria Abdominal:   No bright red blood per rectum, melena, or hematemesis. Positive for nausea, vomiting, and diarrhea. Neurologic:  No visual changes, wkns, changes in mental status. All other systems reviewed and are otherwise negative except as noted above.  Physical Exam    Blood pressure 109/57, pulse 92, temperature 99.7 F (37.6 C), temperature source Oral, resp. rate 22, height 5\' 8"  (1.727 m), weight 182 lb 12.2 oz (82.9 kg), SpO2 96 %.  General: Pleasant, African American male appearing in NAD. Psych: Normal affect. Neuro: Alert and oriented X 3.  Moves all extremities spontaneously. HEENT: Normal  Neck: Supple without bruits or JVD. Lungs:  Resp regular and unlabored, CTA without wheezing or rales.  Heart: RRR no s3, s4, or murmurs. Abdomen: Soft, non-tender, non-distended, BS + x 4.  Extremities: No clubbing or cyanosis. Trace edema. DP/PT/Radials 2+ and equal bilaterally. Fistula present.  Labs    Troponin (Point of Care Test) No results for input(s): TROPIPOC in the last 72 hours.  Recent Labs  10/20/15 1008 10/20/15 1440 10/20/15 2122 10/21/15 0053  TROPONINI 4.56* 4.73* 3.36* 3.18*   Lab Results  Component Value Date   WBC 18.3* 10/20/2015   HGB 11.4* 10/20/2015   HCT 35.1* 10/20/2015   MCV 91.5 10/20/2015   PLT 118*  10/20/2015    Recent Labs Lab 10/20/15 1008  NA 141  K 5.6*  CL 97*  CO2 32  BUN 42*  CREATININE 6.33*  CALCIUM 8.6*  PROT 5.8*  BILITOT 8.0*  ALKPHOS 134*  ALT 41  AST 93*  GLUCOSE 134*   Lab Results  Component Value Date   CHOL 184 05/31/2013   HDL 37* 05/31/2013   LDLCALC 111* 05/31/2013   TRIG 182 05/31/2013   No results found for: [facility]   Radiology Studies    Dg Chest 2 View: 10/20/2015  CLINICAL DATA:  Weakness in all extremities, cough, hypoxia, chills beginning yesterday after dialysis, vomited for 2 days, awake and alert, loose stools, end-stage renal disease, type II diabetes mellitus, stroke, CHF, former smoker, coronary artery disease EXAM: CHEST  2 VIEW COMPARISON:  05/03/2015 FINDINGS: Enlargement of cardiac silhouette with pulmonary vascular congestion. Mediastinal contours normal. Bronchitic changes with bibasilar atelectasis and chronic accentuation of interstitial markings, stable. No definite acute failure or consolidation. No pleural effusion or pneumothorax. Bones demineralized. IMPRESSION: Enlargement of cardiac silhouette with pulmonary vascular congestion. Bronchitic changes with bibasilar atelectasis. Electronically Signed   By: Lavonia Dana M.D.   On:  10/20/2015 12:07    EKG & Cardiac Imaging    EKG: Sinus arrhythmia with HR of 71. No acute ST or T-wave changes.    Echocardiogram: 12/28/2014 Study Conclusions - Left ventricle: The cavity size was mildly dilated. Systolic function was moderately reduced. The estimated ejection fraction was in the range of 35% to 40%. Moderate diffuse hypokinesis. Regional wall motion abnormalities cannot be excluded. Left ventricular diastolic function parameters were normal. - Mitral valve: There was mild regurgitation. - Left atrium: The atrium was moderately dilated. - Right ventricle: The cavity size was mildly dilated. Wall thickness was normal. - Tricuspid valve: There was mild regurgitation. - Pulmonary arteries: Systolic pressure was within the normal range.  Assessment & Plan     1. NSTEMI/ History of CAD - cath in 2014 with 100% total occlusion of PDA, normal LM, 30% mLAD, 30% dLAD, 50% LCx, 20% mid-Ramus, 80% OM3, 20% pRCA and mRCA. NST in 01/2014 and showed severe hypokinesis of the inferior wall and mild hypokinesis of the lateral wall.  - troponin values this admission have been 4.56, 4.73, 3.36, and 3.18. EKG at time of admission shows no acute ischemic changes. Will obtain repeat EKG. - denies any recent chest pain. Does report dyspnea in the past two weeks, occuring in the setting of a cold. - had previously been on Lipitor, Hydralazine, Imdur, and Lopressor. His wife reports these were discontinued over 3 months ago due to hypotension during HD. He was restarted on Lopressor 25mg  BID at time of admission, but had hypotension overnight. Will decrease Lopressor dose to 12.5mg  BID. Would not restart statin at this time due to elevated LFT's. Can consider restarting low-dose Imdur pending BP response to BB. - continue ASA, Lopressor (as above), and Heparin. - will obtain repeat echocardiogram to assess LV function. May need further ischemic evaluation pending echo results,  repeat EKG, and recovery from his acute illness. MD to assess.  2. Chronic Systolic CHF - EF 123456 by echo in 12/2014 - While in the ED, he was given a fluid bolus due to his elevated Lactic Acid and desaturated into the high-80's on room air. He was placed on 4L [place] at that time with improvement in his symptoms. - currently on room air and in no acute distress. Does not appear overly  volume overloaded on physical exam. Would be conservative with IV fluids. - continue BB as above.   3. ESRD - HD on Monday, Wednesday, Friday schedule. - Nephrology following.  4. Type 2 DM - per admitting team  5. Nausea, Vomiting, Diarrhea - C.diff negative - symptoms currently improved. - per admitting team   Signed, Erma Heritage, PA-C 10/21/2015, 7:38 AM Pager: [phone]

## 2015-10-21 NOTE — Progress Notes (Signed)
ANTICOAGULATION CONSULT NOTE - FOLLOW UP   Pharmacy Consult for Heparin Indication: chest pain/ACS  No Known Allergies  Patient Measurements: Height: 5\' 8"  (172.7 cm) Weight: 182 lb 12.2 oz (82.9 kg) IBW/kg (Calculated) : 68.4 Heparin Dosing Weight: 81.6 kg  Vital Signs: Temp: 99.7 F (37.6 C) (03/03 0444) Temp Source: Oral (03/03 0444) BP: 105/60 mmHg (03/03 0756) Pulse Rate: 92 (03/03 0444)  Labs:  Recent Labs  10/20/15 1008 10/20/15 1440 10/20/15 2122 10/21/15 0053 10/21/15 0805  HGB 11.4*  --   --   --  10.4*  HCT 35.1*  --   --   --  31.7*  PLT 118*  --   --   --  113*  APTT 35  --   --   --   --   LABPROT 17.8*  --   --   --   --   INR 1.46  --   --   --   --   HEPARINUNFRC  --   --  0.18*  --  0.27*  CREATININE 6.33*  --   --   --  4.90*  TROPONINI 4.56* 4.73* 3.36* 3.18*  --     Estimated Creatinine Clearance: 16.4 mL/min (by C-G formula based on Cr of 4.9).     Assessment: 65 yo male with PMH including CAD here with elevated troponin starting on heparin drip. Of note pt with ESRD on HD. No anticoagulants noted PTA.  Hgb 11.4, Plt 118 - Spoke with Dr. Darvin Neighbours, ok to start heparin drip with plt count of 118 INR 1.46, aPTT 35  3/3 @ 08:05 Heparin level resulted @ 0.27, which is subtherapeutic.    Goal of Therapy:  Heparin level 0.3-0.7 units/ml Monitor platelets by anticoagulation protocol: Yes   Plan:  Will order heparin bolus of 1200 units x 1 and will then increase heparin gtt to 1350 units/hr per protocol. Will order next heparin gtt to be drawn @1900  (~8 hours after heparin rate gtt change).    Lavone Barrientes D, Pharm.D., BCPS Clinical Pharmacist 10/21/2015,10:40 AM

## 2015-10-21 NOTE — Progress Notes (Signed)
[facility] [place] [name]    MR#:  [id number]  DATE OF BIRTH:  [birth year]  SUBJECTIVE:  No complaints this morning, admitted yesterday after nausea vomiting diarrhea found to have elevated troponin  REVIEW OF SYSTEMS:  CONSTITUTIONAL: No fever, fatigue or weakness.  EYES: No blurred or double vision.  EARS, NOSE, AND THROAT: No tinnitus or ear pain.  RESPIRATORY: No cough, shortness of breath, wheezing or hemoptysis.  CARDIOVASCULAR: No chest pain, orthopnea, edema.  GASTROINTESTINAL: No nausea, vomiting, diarrhea or abdominal pain.  GENITOURINARY: No dysuria, hematuria.  ENDOCRINE: No polyuria, nocturia,  HEMATOLOGY: No anemia, easy bruising or bleeding SKIN: No rash or lesion. MUSCULOSKELETAL: No joint pain or arthritis.   NEUROLOGIC: No tingling, numbness, weakness.  PSYCHIATRY: No anxiety or depression.   DRUG ALLERGIES:  No Known Allergies  VITALS:  Blood pressure 115/71, pulse 88, temperature 98.4 F (36.9 C), temperature source Oral, resp. rate 22, height 5\' 8"  (1.727 m), weight 82.9 kg (182 lb 12.2 oz), SpO2 91 %.  PHYSICAL EXAMINATION:  VITAL SIGNS: Filed Vitals:   10/21/15 0756 10/21/15 1244  BP: 105/60 115/71  Pulse:  88  Temp:  98.4 F (36.9 C)  Resp:     GENERAL:64 y.o.male currently in no acute distress.  HEAD: Normocephalic, atraumatic.  EYES: Pupils equal, round, reactive to light. Extraocular muscles intact. No scleral icterus.  MOUTH: Moist mucosal membrane. Dentition intact. No abscess noted.  EAR, NOSE, THROAT: Clear without exudates. No external lesions.  NECK: Supple. No thyromegaly. No nodules. No JVD.  PULMONARY: Clear to ascultation, without wheeze rails or rhonci. No use of accessory muscles, Good respiratory effort. good air entry bilaterally CHEST: Nontender to palpation.  CARDIOVASCULAR: S1 and S2. Regular rate and rhythm. No murmurs, rubs, or gallops. No edema. Pedal pulses  2+ bilaterally.  GASTROINTESTINAL: Soft, nontender, nondistended. No masses. Positive bowel sounds. No hepatosplenomegaly.  MUSCULOSKELETAL: No swelling, clubbing, or edema. Range of motion full in all extremities.  NEUROLOGIC: Cranial nerves II through XII are intact. No gross focal neurological deficits. Sensation intact. Reflexes intact.  SKIN: No ulceration, lesions, rashes, or cyanosis. Skin warm and dry. Turgor intact.  PSYCHIATRIC: Mood, affect within normal limits. The patient is awake, alert and oriented x 3. Insight, judgment intact.      LABORATORY PANEL:   CBC  Recent Labs Lab 10/21/15 0805  WBC 11.6*  HGB 10.4*  HCT 31.7*  PLT 113*   ------------------------------------------------------------------------------------------------------------------  Chemistries   Recent Labs Lab 10/20/15 1008 10/21/15 0805  NA 141 141  K 5.6* 4.5  CL 97* 100*  CO2 32 32  GLUCOSE 134* 75  BUN 42* 36*  CREATININE 6.33* 4.90*  CALCIUM 8.6* 8.4*  AST 93*  --   ALT 41  --   ALKPHOS 134*  --   BILITOT 8.0*  --    ------------------------------------------------------------------------------------------------------------------  Cardiac Enzymes  Recent Labs Lab 10/21/15 0053  TROPONINI 3.18*   ------------------------------------------------------------------------------------------------------------------  RADIOLOGY:  Dg Chest 2 View  10/20/2015  CLINICAL DATA:  Weakness in all extremities, cough, hypoxia, chills beginning yesterday after dialysis, vomited for 2 days, awake and alert, loose stools, end-stage renal disease, type II diabetes mellitus, stroke, CHF, former smoker, coronary artery disease EXAM: CHEST  2 VIEW COMPARISON:  05/03/2015 FINDINGS: Enlargement of cardiac silhouette with pulmonary vascular congestion. Mediastinal contours normal. Bronchitic changes with bibasilar atelectasis and chronic accentuation of interstitial markings, stable. No definite acute  failure or consolidation. No pleural  effusion or pneumothorax. Bones demineralized. IMPRESSION: Enlargement of cardiac silhouette with pulmonary vascular congestion. Bronchitic changes with bibasilar atelectasis. Electronically Signed   By: Lavonia Dana M.D.   On: 10/20/2015 12:07    EKG:   Orders placed or performed during the hospital encounter of 10/20/15  . EKG 12-Lead  . EKG 12-Lead    ASSESSMENT AND PLAN:   65 year old African-American gentleman history of end-stage renal disease on hemodialysis, type 2 diabetes insulin requiring history of C. difficile infection presenting with nausea vomiting diarrhea have elevated troponin  1. NSTEMI: Cardiology input appreciated continue heparin 48 hours, echocardiogram pending at this point may require catheterization was evaluated after echo, continue aspirin, add statin therapy 2. Bacteremia: Discussed with nephrology, as well as outpatient nephrology blood culture 1 gram-negative rods started on Zosyn. Follow culture data 3. Diarrhea-C. difficile negative Flagyl  4. End-stage renal disease hemodialysis: Nephrology 5. Venous thromboembolism prophylactic: Therapeutic heparin    All the records are reviewed and case discussed with Care Management/Social Workerr. Management plans discussed with the patient, family and they are in agreement.  CODE STATUS: Full  TOTAL TIME TAKING CARE OF THIS PATIENT: 33 minutes.   POSSIBLE D/C IN 2-3 DAYS, DEPENDING ON CLINICAL CONDITION.   Hower,  Karenann Cai.D on 10/21/2015 at 2:45 PM  Between 7am to 6pm - Pager - [phone]  After 6pm: House Pager: [phone]  [facility]  Office  [phone]  CC: Primary care physician; Amy Annamary Rummage, NP

## 2015-10-21 NOTE — Progress Notes (Signed)
Subjective:  Presents for vomiting that started at dialysis   Cold chills and Loose stools at home Poor appetite and vomiting x 1-2 days Noted to have low grade temp here  Per report outpatient cultures drawn at dialysis are positive for Gram neg organisms    Objective:  Vital signs in last 24 hours:  Temp:  [98.4 F (36.9 C)-100.5 F (38.1 C)] 98.4 F (36.9 C) (03/03 1244) Pulse Rate:  [64-139] 88 (03/03 1244) Resp:  [18-22] 22 (03/03 0444) BP: (96-120)/(45-71) 115/71 mmHg (03/03 1244) SpO2:  [91 %-96 %] 91 % (03/03 1244) Weight:  [82.9 kg (182 lb 12.2 oz)-85.6 kg (188 lb 11.4 oz)] 82.9 kg (182 lb 12.2 oz) (03/03 0444)  Weight change:  Filed Weights   10/20/15 2115 10/21/15 0015 10/21/15 0444  Weight: 85.6 kg (188 lb 11.4 oz) 85.1 kg (187 lb 9.8 oz) 82.9 kg (182 lb 12.2 oz)    Intake/Output:    Intake/Output Summary (Last 24 hours) at 10/21/15 1344 Last data filed at 10/21/15 1300  Gross per 24 hour  Intake 608.58 ml  Output      2 ml  Net 606.58 ml     Physical Exam: General: NAD, sick appearing  HEENT Muddy sclera  Neck supple  Pulm/lungs clear  CVS/Heart No rub  Abdomen:  Soft, NT  Extremities: No edema  Neurologic: Resting quietly  Skin: No rashes  Access: Left forearm AVF       Basic Metabolic Panel:   Recent Labs Lab 10/20/15 1008 10/21/15 0805  NA 141 141  K 5.6* 4.5  CL 97* 100*  CO2 32 32  GLUCOSE 134* 75  BUN 42* 36*  CREATININE 6.33* 4.90*  CALCIUM 8.6* 8.4*     CBC:  Recent Labs Lab 10/20/15 1008 10/21/15 0805  WBC 18.3* 11.6*  HGB 11.4* 10.4*  HCT 35.1* 31.7*  MCV 91.5 91.3  PLT 118* 113*      Microbiology:  Recent Results (from the past 720 hour(s))  Rapid Influenza A&B Antigens ([facility] only)     Status: None   Collection Time: 10/20/15 11:38 AM  Result Value Ref Range Status   Influenza A ([facility]) NEGATIVE NEGATIVE Final   Influenza B ([facility]) NEGATIVE NEGATIVE Final  MRSA PCR Screening     Status: None   Collection Time: 10/20/15  4:29 PM  Result Value Ref Range Status   MRSA by PCR NEGATIVE NEGATIVE Final    Comment:        The GeneXpert MRSA Assay (FDA approved for NASAL specimens only), is one component of a comprehensive MRSA colonization surveillance program. It is not intended to diagnose MRSA infection nor to guide or monitor treatment for MRSA infections.   C difficile quick scan w PCR reflex     Status: None   Collection Time: 10/21/15  5:00 AM  Result Value Ref Range Status   C Diff antigen NEGATIVE NEGATIVE Final   C Diff toxin NEGATIVE NEGATIVE Final   C Diff interpretation Negative for C. difficile  Final    Coagulation Studies:  Recent Labs  10/20/15 1008  LABPROT 17.8*  INR 1.46    Urinalysis: No results for input(s): COLORURINE, LABSPEC, PHURINE, GLUCOSEU, HGBUR, BILIRUBINUR, KETONESUR, PROTEINUR, UROBILINOGEN, NITRITE, LEUKOCYTESUR in the last 72 hours.  Invalid input(s): APPERANCEUR    Imaging: Dg Chest 2 View  10/20/2015  CLINICAL DATA:  Weakness in all extremities, cough, hypoxia, chills beginning yesterday after dialysis, vomited for 2 days, awake and alert, loose stools, end-stage  renal disease, type II diabetes mellitus, stroke, CHF, former smoker, coronary artery disease EXAM: CHEST  2 VIEW COMPARISON:  05/03/2015 FINDINGS: Enlargement of cardiac silhouette with pulmonary vascular congestion. Mediastinal contours normal. Bronchitic changes with bibasilar atelectasis and chronic accentuation of interstitial markings, stable. No definite acute failure or consolidation. No pleural effusion or pneumothorax. Bones demineralized. IMPRESSION: Enlargement of cardiac silhouette with pulmonary vascular congestion. Bronchitic changes with bibasilar atelectasis. Electronically Signed   By: Lavonia Dana M.D.   On: 10/20/2015 12:07     Medications:   . heparin 1,350 Units/hr (10/21/15 1046)   . aspirin EC  81 mg Oral QHS  . feeding supplement (NEPRO CARB  STEADY)  237 mL Oral BID BM  . insulin aspart  0-5 Units Subcutaneous QHS  . insulin aspart  0-9 Units Subcutaneous TID WC  . metoprolol tartrate  12.5 mg Oral BID  . metroNIDAZOLE  500 mg Oral 3 times per day  . sevelamer carbonate  3,200 mg Oral TID WC  . sodium chloride flush  3 mL Intravenous Q12H     Assessment/ Plan:  65 y.o.AA male with ESRD, Dialysis 2007-2008, DM-2, HTN, CVA, CAD  1. ESRD/ [facility]/ MWF 2. Hyperkalemia 3. AOCKD 4. SHPTH 5. NSTEMI 6. Gram NEG sepsis (from outpatient dialysis)- full report pending  Plan: Dialysis tomorrow (sat) then back on schedule next week Monitor Hgb and Phos Start ceftazidime     LOS: 1 Camara Renstrom 3/3/20171:44 PM

## 2015-10-21 NOTE — Progress Notes (Signed)
ANTICOAGULATION CONSULT NOTE - FOLLOW UP   Pharmacy Consult for Heparin Indication: chest pain/ACS  No Known Allergies  Patient Measurements: Height: 5\' 8"  (172.7 cm) Weight: 182 lb 12.2 oz (82.9 kg) IBW/kg (Calculated) : 68.4 Heparin Dosing Weight: 81.6 kg  Vital Signs: Temp: 99.3 F (37.4 C) (03/03 1940) Temp Source: Oral (03/03 1940) BP: 107/61 mmHg (03/03 1940) Pulse Rate: 90 (03/03 1940)  Labs:  Recent Labs  10/20/15 1008 10/20/15 1440 10/20/15 2122 10/21/15 0053 10/21/15 0805 10/21/15 1859  HGB 11.4*  --   --   --  10.4*  --   HCT 35.1*  --   --   --  31.7*  --   PLT 118*  --   --   --  113*  --   APTT 35  --   --   --   --   --   LABPROT 17.8*  --   --   --   --   --   INR 1.46  --   --   --   --   --   HEPARINUNFRC  --   --  0.18*  --  0.27* 0.16*  CREATININE 6.33*  --   --   --  4.90*  --   TROPONINI 4.56* 4.73* 3.36* 3.18*  --   --     Estimated Creatinine Clearance: 16.4 mL/min (by C-G formula based on Cr of 4.9).     Assessment: 64 yo male with PMH including CAD here with elevated troponin starting on heparin drip. Of note pt with ESRD on HD. No anticoagulants noted PTA.  Hgb 11.4, Plt 118 - Spoke with Dr. Darvin Neighbours, ok to start heparin drip with plt count of 118 INR 1.46, aPTT 35  3/3 @ 08:05 Heparin level resulted @ 0.27, which is subtherapeutic.   3/3 HL @ 19:00 subtherapeutic at 0.16 on an infusion of 1350 units/hr (~16.5 units/kg/hr)   Goal of Therapy:  Heparin level 0.3-0.7 units/ml Monitor platelets by anticoagulation protocol: Yes   Plan:  Will order heparin bolus of 2400units x 1 and will then increase heparin gtt to 1500 units/hr per protocol.   Will order next heparin gtt to be drawn @0500  tomorrow (~8 hours after heparin rate change).    Lenis Noon, Pharm.D. Clinical Pharmacist 10/21/2015,8:34 PM

## 2015-10-21 NOTE — Progress Notes (Signed)
Pharmacy Antibiotic Note  Luis Walter is a 65 y.o. male admitted on 10/20/2015 with ACS/Chest pain. According to  outpatient cultures drawn at dialysis are positive for Gram neg organisms and nephrology has consulted pharmacy for dosing of Zosyn. Patient has had cold chills and vomiting with poor appetite and low grade fever.   Plan: Patient will be started on Zosyn 3.375g IV EI q12 hours.  Pharmacy will continue to follow per consult.   Height: 5\' 8"  (172.7 cm) Weight: 182 lb 12.2 oz (82.9 kg) IBW/kg (Calculated) : 68.4  Temp (24hrs), Avg:99.6 F (37.6 C), Min:98.4 F (36.9 C), Max:100.5 F (38.1 C)   Recent Labs Lab 10/20/15 1008 10/20/15 1225 10/20/15 1440 10/21/15 0805  WBC 18.3*  --   --  11.6*  CREATININE 6.33*  --   --  4.90*  LATICACIDVEN  --  2.3* 2.2*  --     Estimated Creatinine Clearance: 16.4 mL/min (by C-G formula based on Cr of 4.9).    No Known Allergies  Antimicrobials this admission: 3/3 >> Zosyn   Dose adjustments this admission: Zosyn   Microbiology results: 3/1 BCx: Outpatient cultures pending according Dr. Ledell Noss note, currently showing Gram neg organisms  Thank you for allowing pharmacy to be a part of this patient's care.  Nancy Fetter, PharmD Pharmacy Resident  10/21/2015 2:04 PM

## 2015-10-21 NOTE — Plan of Care (Signed)
Problem: Activity: Goal: Risk for activity intolerance will decrease Outcome: Not Progressing Pt is lethargic, not participating in care.

## 2015-10-21 NOTE — Care Management (Signed)
NSTEMI. On Heparin gtt. Spoke with patients wife. Patient presents from home. He is independent, active, works part time and drives. No DME. No home Health. PCP is [facility] (Amy?). He is current with PCP. No needs anticipated at this time. Provided wife with Cm contact information.

## 2015-10-22 ENCOUNTER — Inpatient Hospital Stay
Admit: 2015-10-22 | Discharge: 2015-10-22 | Disposition: A | Discharge status: Home / Self Care | Payer: Medicare Other | Attending: Student | Admitting: Student

## 2015-10-22 DIAGNOSIS — R7989 Other specified abnormal findings of blood chemistry: Secondary | ICD-10-CM

## 2015-10-22 DIAGNOSIS — I2510 Atherosclerotic heart disease of native coronary artery without angina pectoris: Secondary | ICD-10-CM

## 2015-10-22 DIAGNOSIS — N186 End stage renal disease: Secondary | ICD-10-CM

## 2015-10-22 LAB — CBC WITH DIFFERENTIAL/PLATELET
BASOS ABS: 0.1 10*3/uL (ref 0–0.1)
BASOS PCT: 1 %
EOS ABS: 0 10*3/uL (ref 0–0.7)
Eosinophils Relative: 0 %
HCT: 31 % — ABNORMAL LOW (ref 40.0–52.0)
HEMOGLOBIN: 10.2 g/dL — AB (ref 13.0–18.0)
Lymphocytes Relative: 11 %
Lymphs Abs: 0.8 10*3/uL — ABNORMAL LOW (ref 1.0–3.6)
MCH: 30.3 pg (ref 26.0–34.0)
MCHC: 32.7 g/dL (ref 32.0–36.0)
MCV: 92.6 fL (ref 80.0–100.0)
Monocytes Absolute: 0.5 10*3/uL (ref 0.2–1.0)
Monocytes Relative: 7 %
NEUTROS PCT: 81 %
Neutro Abs: 5.7 10*3/uL (ref 1.4–6.5)
PLATELETS: 112 10*3/uL — AB (ref 150–440)
RBC: 3.35 MIL/uL — AB (ref 4.40–5.90)
RDW: 15.3 % — ABNORMAL HIGH (ref 11.5–14.5)
WBC: 7.1 10*3/uL (ref 3.8–10.6)

## 2015-10-22 LAB — BLOOD CULTURE ID PANEL (REFLEXED)
Acinetobacter baumannii: NOT DETECTED
CANDIDA ALBICANS: NOT DETECTED
CANDIDA GLABRATA: NOT DETECTED
CANDIDA KRUSEI: NOT DETECTED
CARBAPENEM RESISTANCE: NOT DETECTED
Candida parapsilosis: NOT DETECTED
Candida tropicalis: NOT DETECTED
ENTEROBACTER CLOACAE COMPLEX: NOT DETECTED
ENTEROCOCCUS SPECIES: NOT DETECTED
Enterobacteriaceae species: NOT DETECTED
Escherichia coli: NOT DETECTED
HAEMOPHILUS INFLUENZAE: NOT DETECTED
KLEBSIELLA PNEUMONIAE: NOT DETECTED
Klebsiella oxytoca: NOT DETECTED
Listeria monocytogenes: NOT DETECTED
Methicillin resistance: NOT DETECTED
Neisseria meningitidis: NOT DETECTED
PROTEUS SPECIES: NOT DETECTED
Pseudomonas aeruginosa: NOT DETECTED
STREPTOCOCCUS AGALACTIAE: NOT DETECTED
STREPTOCOCCUS PNEUMONIAE: NOT DETECTED
STREPTOCOCCUS PYOGENES: NOT DETECTED
STREPTOCOCCUS SPECIES: NOT DETECTED
Serratia marcescens: NOT DETECTED
Staphylococcus aureus (BCID): NOT DETECTED
Staphylococcus species: DETECTED — AB
VANCOMYCIN RESISTANCE: NOT DETECTED

## 2015-10-22 LAB — HEPARIN LEVEL (UNFRACTIONATED)
HEPARIN UNFRACTIONATED: 0.24 [IU]/mL — AB (ref 0.30–0.70)
HEPARIN UNFRACTIONATED: 2.06 [IU]/mL — AB (ref 0.30–0.70)

## 2015-10-22 LAB — HEPATITIS B SURFACE ANTIGEN: Hepatitis B Surface Ag: NEGATIVE

## 2015-10-22 LAB — GLUCOSE, CAPILLARY
GLUCOSE-CAPILLARY: 116 mg/dL — AB (ref 65–99)
Glucose-Capillary: 100 mg/dL — ABNORMAL HIGH (ref 65–99)
Glucose-Capillary: 112 mg/dL — ABNORMAL HIGH (ref 65–99)

## 2015-10-22 MED ORDER — HEPARIN BOLUS VIA INFUSION
1250.0000 [IU] | Freq: Once | INTRAVENOUS | Status: AC
  Administered 2015-10-22: 1250 [IU] via INTRAVENOUS
  Filled 2015-10-22: qty 1250

## 2015-10-22 MED ORDER — HEPARIN SODIUM (PORCINE) 5000 UNIT/ML IJ SOLN
5000.0000 [IU] | Freq: Three times a day (TID) | INTRAMUSCULAR | Status: DC
  Administered 2015-10-22 – 2015-10-23 (×3): 5000 [IU] via SUBCUTANEOUS
  Filled 2015-10-22 (×6): qty 1

## 2015-10-22 MED ORDER — VANCOMYCIN HCL IN DEXTROSE 1-5 GM/200ML-% IV SOLN
1000.0000 mg | Freq: Once | INTRAVENOUS | Status: AC
Start: 2015-10-22 — End: 2015-10-22
  Administered 2015-10-22: 1000 mg via INTRAVENOUS
  Filled 2015-10-22: qty 200

## 2015-10-22 NOTE — Progress Notes (Signed)
This note also relates to the following rows which could not be included: Pulse Rate - Cannot attach notes to unvalidated device data SpO2 - Cannot attach notes to unvalidated device data   Post hd tx

## 2015-10-22 NOTE — Progress Notes (Signed)
[place] [facility] [place] [name]    MR#:  [id number]  DATE OF BIRTH:  [birth year]  SUBJECTIVE:  No complaints this morning, admitted yesterday after nausea vomiting diarrhea found to have elevated troponin. Negative rods in blood stream in outpatient labs, per Dr. Candiss Norse, identification is pending, patient is on Zosyn and vancomycin at present. Blood cultures taken in the emergency room on admission are growing gram-positive cocci, identification is pending, likely contamination per Dr. Candiss Norse.  Patient feels better today. Denies any nausea, vomiting or diarrhea. Denies any abdominal pain or overall discomfort. Denies pain in AV fistula site in left upper extremity. Hemodialysis today  REVIEW OF SYSTEMS:  CONSTITUTIONAL: No fever, fatigue or weakness.  EYES: No blurred or double vision.  EARS, NOSE, AND THROAT: No tinnitus or ear pain.  RESPIRATORY: No cough, shortness of breath, wheezing or hemoptysis.  CARDIOVASCULAR: No chest pain, orthopnea, edema.  GASTROINTESTINAL: No nausea, vomiting, diarrhea or abdominal pain.  GENITOURINARY: No dysuria, hematuria.  ENDOCRINE: No polyuria, nocturia,  HEMATOLOGY: No anemia, easy bruising or bleeding SKIN: No rash or lesion. MUSCULOSKELETAL: No joint pain or arthritis.   NEUROLOGIC: No tingling, numbness, weakness.  PSYCHIATRY: No anxiety or depression.   DRUG ALLERGIES:  No Known Allergies  VITALS:  Blood pressure 104/66, pulse 77, temperature 98.9 F (37.2 C), temperature source Oral, resp. rate 14, height 5\' 8"  (1.727 m), weight 80.6 kg (177 lb 11.1 oz), SpO2 94 %.  PHYSICAL EXAMINATION:  VITAL SIGNS: Filed Vitals:   10/22/15 1234 10/22/15 1239  BP: 104/66   Pulse: 77 77  Temp: 98.9 F (37.2 C)   Resp: 14    GENERAL:65 y.o.male currently in no acute distress.  HEAD: Normocephalic, atraumatic.  EYES: Pupils equal, round, reactive to light. Extraocular muscles intact. No  scleral icterus.  MOUTH: Moist mucosal membrane. Dentition intact. No abscess noted.  EAR, NOSE, THROAT: Clear without exudates. No external lesions.  NECK: Supple. No thyromegaly. No nodules. No JVD.  PULMONARY: Clear to ascultation, without wheeze rails or rhonci. No use of accessory muscles, Good respiratory effort. good air entry bilaterally CHEST: Nontender to palpation.  CARDIOVASCULAR: S1 and S2. Regular rate and rhythm. No murmurs, rubs, or gallops. No edema. Pedal pulses 2+ bilaterally.  GASTROINTESTINAL: Soft, nontender, nondistended. No masses. Positive bowel sounds. No hepatosplenomegaly.  MUSCULOSKELETAL: No swelling, clubbing, or edema. Range of motion full in all extremities. Left upper extremity AV fistula is normal on palpation. No tick tenderness, bruit and thrill was palpable and auscultated.  NEUROLOGIC: Cranial nerves II through XII are intact. No gross focal neurological deficits. Sensation intact. Reflexes intact.  SKIN: No ulceration, lesions, rashes, or cyanosis. Skin warm and dry. Turgor intact.  PSYCHIATRIC: Mood, affect within normal limits. The patient is awake, alert and oriented x 3. Insight, judgment intact.      LABORATORY PANEL:   CBC  Recent Labs Lab 10/22/15 0441  WBC 7.1  HGB 10.2*  HCT 31.0*  PLT 112*   ------------------------------------------------------------------------------------------------------------------  Chemistries   Recent Labs Lab 10/20/15 1008 10/21/15 0805  NA 141 141  K 5.6* 4.5  CL 97* 100*  CO2 32 32  GLUCOSE 134* 75  BUN 42* 36*  CREATININE 6.33* 4.90*  CALCIUM 8.6* 8.4*  AST 93*  --   ALT 41  --   ALKPHOS 134*  --   BILITOT 8.0*  --    ------------------------------------------------------------------------------------------------------------------  Cardiac Enzymes  Recent Labs Lab 10/21/15 0053  TROPONINI  3.18*    ------------------------------------------------------------------------------------------------------------------  RADIOLOGY:  No results found.  EKG:   Orders placed or performed during the hospital encounter of 10/20/15  . EKG 12-Lead  . EKG 12-Lead    ASSESSMENT AND PLAN:   65 year old African-American gentleman history of end-stage renal disease on hemodialysis, type 2 diabetes insulin requiring history of C. difficile infection presenting with nausea vomiting diarrhea have elevated troponin  1. NSTEMI: Cardiology input appreciated, patient completed 48-hour heparin infusion, echocardiogram pending at this point,  may require catheterization , continue aspirin, statin therapy, no chest pain 2. Gram-negative sepsis: Discussed with nephrology, ID is not done yet, continue Zosyn as well as vancomycin. Since patient's repeated blood cultures on admission revealed gram-positive cocci, identification is pending, although suspected contamination 3. Diarrhea-C. difficile negative Flagyl  4.End-stage renal disease hemodialysis: Nephrology consult is appreciated, hemodialysis today 5. Venous thromboembolism prophylactic: Prophylactic heparin    All the records are reviewed and case discussed with Care Management/Social Workerr. Management plans discussed with the patient, family and they are in agreement.  CODE STATUS: Full  TOTAL TIME TAKING CARE OF THIS PATIENT: 35 minutes.  Discussed with Dr. Candiss Norse POSSIBLE D/C IN 2-3 DAYS, DEPENDING ON CLINICAL CONDITION.   Theodoro Grist M.D on 10/22/2015 at 1:26 PM  Between 7am to 6pm - Pager - [phone]  After 6pm: House Pager: - [phone]  [facility]  Office  [phone]  CC: Primary care physician; Amy Annamary Rummage, NP

## 2015-10-22 NOTE — Progress Notes (Signed)
SUBJECTIVE: No chest pain, no dyspnea. Pt reports lifting weights at home and working in the yard with no chest pain or dyspnea over last year.   Tele: sinus, PVCs  BP 100/59 mmHg  Pulse 81  Temp(Src) 99.5 F (37.5 C) (Oral)  Resp 22  Ht 5\' 8"  (1.727 m)  Wt 179 lb 10.8 oz (81.5 kg)  BMI 27.33 kg/m2  SpO2 93%  Intake/Output Summary (Last 24 hours) at 10/22/15 [id number] Last data filed at 10/21/15 1721  Gross per 24 hour  Intake 664.08 ml  Output      2 ml  Net 662.08 ml    PHYSICAL EXAM General: Well developed, well nourished, in no acute distress. Alert and oriented x 3.  Psych:  Good affect, responds appropriately Neck: No JVD. No masses noted.  Lungs: Clear bilaterally with no wheezes or rhonci noted.  Heart: RRR with no murmurs noted. Abdomen: Bowel sounds are present. Soft, non-tender.  Extremities: No lower extremity edema.   LABS: Basic Metabolic Panel:  Recent Labs  10/20/15 1008 10/21/15 0805  NA 141 141  K 5.6* 4.5  CL 97* 100*  CO2 32 32  GLUCOSE 134* 75  BUN 42* 36*  CREATININE 6.33* 4.90*  CALCIUM 8.6* 8.4*   CBC:  Recent Labs  10/21/15 0805 10/22/15 0441  WBC 11.6* 7.1  NEUTROABS  --  5.7  HGB 10.4* 10.2*  HCT 31.7* 31.0*  MCV 91.3 92.6  PLT 113* 112*   Cardiac Enzymes:  Recent Labs  10/20/15 1440 10/20/15 2122 10/21/15 0053  TROPONINI 4.73* 3.36* 3.18*   Fasting Lipid Panel:  Recent Labs  10/21/15 0805  CHOL 102  HDL 29*  LDLCALC 55  TRIG 88  CHOLHDL 3.5    Current Meds: . aspirin EC  81 mg Oral QHS  . atorvastatin  40 mg Oral q1800  . feeding supplement (NEPRO CARB STEADY)  237 mL Oral BID BM  . insulin aspart  0-5 Units Subcutaneous QHS  . insulin aspart  0-9 Units Subcutaneous TID WC  . metoprolol tartrate  12.5 mg Oral BID  . piperacillin-tazobactam  3.375 g Intravenous Q12H  . sevelamer carbonate  3,200 mg Oral TID WC  . sodium chloride flush  3 mL Intravenous Q12H     ASSESSMENT AND PLAN:   1.  Elevated troponin/History of CAD: He has elevated troponin but no chest pain or ischemic EKG changes. Elevated troponin is likely due to demand ischemia in setting of acute viral/bacterial illness in setting of ESRD. He is known to have CAD with last cath in 2014 with 100% total occlusion of PDA, normal LM, 30% mLAD, 30% dLAD, 50% LCx, 20% mid-Ramus, 80% OM3, 20% pRCA and mRCA. Stress test 01/2014 showed severe hypokinesis of the inferior wall and mild hypokinesis of the lateral wall. He had previously been on Lipitor, Hydralazine, Imdur, and Lopressor. His wife reported these were discontinued over 3 months ago due to hypotension during HD. He is now back on Lopressor 12.5 mg po BID. Statin has been restarted by primary team. Continue ASA, Lopressor and IV Heparin for total of 48 hours. I have personally reviewed his echo images. I am not the ordering or interpreting physician of the echo. The echo image quality is poor but demonstrates mild LV systolic dysfunction which appears to be improved from comparison echo January 2016. There is trivial valve disease and no evidence of pericardial effusion. The echo will be officially read by [facility] on Monday.  No invasive cardiac evaluation planned at this time.   2. Chronic Systolic CHF: He does not appear to be volume overloaded on physical exam. Continue beta blocker.    3. ESRD: HD on Monday, Wednesday, Friday schedule. Nephrology following.    MCALHANY,CHRISTOPHER  3/4/20179:24 AM

## 2015-10-22 NOTE — Progress Notes (Signed)
Hemodialysis start 

## 2015-10-22 NOTE — Progress Notes (Signed)
Hd tx completed. 

## 2015-10-22 NOTE — Progress Notes (Signed)
Post hd tx 

## 2015-10-22 NOTE — Progress Notes (Signed)
Pre-hd tx 

## 2015-10-22 NOTE — Progress Notes (Signed)
Subjective:  Patient seen during dialysis Tolerating well     HEMODIALYSIS FLOWSHEET:  Blood Flow Rate (mL/min): 400 mL/min Arterial Pressure (mmHg): -140 mmHg Venous Pressure (mmHg): 150 mmHg Transmembrane Pressure (mmHg): 50 mmHg Ultrafiltration Rate (mL/min): 500 mL/min Dialysate Flow Rate (mL/min): 800 ml/min Conductivity: Machine : 14.1 Conductivity: Machine : 14.1 Dialysis Fluid Bolus: Normal Saline Bolus Amount (mL): 250 mL Intra-Hemodialysis Comments: 726 (resting, denies pain)  outpatient cultures drawn at [facility] dialysis are positive for Gram neg organisms ID not available Feels better today   Objective:  Vital signs in last 24 hours:  Temp:  [98.4 F (36.9 C)-99.5 F (37.5 C)] 98.9 F (37.2 C) (03/04 0930) Pulse Rate:  [77-90] 78 (03/04 1130) Resp:  [12-26] 14 (03/04 1100) BP: (99-115)/(59-74) 106/63 mmHg (03/04 1130) SpO2:  [91 %-98 %] 91 % (03/04 1130) Weight:  [81.5 kg (179 lb 10.8 oz)-81.7 kg (180 lb 1.9 oz)] 81.7 kg (180 lb 1.9 oz) (03/04 0930)  Weight change: -0.147 kg (-5.2 oz) Filed Weights   10/21/15 0444 10/22/15 0419 10/22/15 0930  Weight: 82.9 kg (182 lb 12.2 oz) 81.5 kg (179 lb 10.8 oz) 81.7 kg (180 lb 1.9 oz)    Intake/Output:    Intake/Output Summary (Last 24 hours) at 10/22/15 1151 Last data filed at 10/21/15 1721  Gross per 24 hour  Intake 618.88 ml  Output      0 ml  Net 618.88 ml     Physical Exam: General: NAD,   HEENT Muddy sclera  Neck supple  Pulm/lungs clear  CVS/Heart No rub  Abdomen:  Soft, NT  Extremities: No edema  Neurologic: Resting quietly  Skin: No rashes  Access: Left forearm AVF       Basic Metabolic Panel:   Recent Labs Lab 10/20/15 1008 10/21/15 0805  NA 141 141  K 5.6* 4.5  CL 97* 100*  CO2 32 32  GLUCOSE 134* 75  BUN 42* 36*  CREATININE 6.33* 4.90*  CALCIUM 8.6* 8.4*     CBC:  Recent Labs Lab 10/20/15 1008 10/21/15 0805 10/22/15 0441  WBC 18.3* 11.6* 7.1  NEUTROABS  --   --   5.7  HGB 11.4* 10.4* 10.2*  HCT 35.1* 31.7* 31.0*  MCV 91.5 91.3 92.6  PLT 118* 113* 112*      Microbiology:  Recent Results (from the past 720 hour(s))  Rapid Influenza A&B Antigens ([facility] only)     Status: None   Collection Time: 10/20/15 11:38 AM  Result Value Ref Range Status   Influenza A ([facility]) NEGATIVE NEGATIVE Final   Influenza B ([facility]) NEGATIVE NEGATIVE Final  Culture, blood (routine x 2)     Status: None (Preliminary result)   Collection Time: 10/20/15 12:20 PM  Result Value Ref Range Status   Specimen Description BLOOD RIGHT WRIST  Final   Special Requests BOTTLES DRAWN AEROBIC AND ANAEROBIC  1CC  Final   Culture NO GROWTH 2 DAYS  Final   Report Status PENDING  Incomplete  Culture, blood (routine x 2)     Status: None (Preliminary result)   Collection Time: 10/20/15 12:30 PM  Result Value Ref Range Status   Specimen Description BLOOD RIGHT HAND  Final   Special Requests BOTTLES DRAWN AEROBIC AND ANAEROBIC 1CC  Final   Culture  Setup Time   Final    GRAM POSITIVE COCCI AEROBIC BOTTLE ONLY CRITICAL RESULT CALLED TO, READ BACK BY AND VERIFIED WITH: NATE COOKSON AT 0018 10/22/15.PMH CONFIRMED BY RWW    Culture  Final    GRAM POSITIVE COCCI AEROBIC BOTTLE ONLY IDENTIFICATION TO FOLLOW    Report Status PENDING  Incomplete  Blood Culture ID Panel (Reflexed)     Status: Abnormal   Collection Time: 10/20/15 12:30 PM  Result Value Ref Range Status   Enterococcus species NOT DETECTED NOT DETECTED Final   Vancomycin resistance NOT DETECTED NOT DETECTED Final   Listeria monocytogenes NOT DETECTED NOT DETECTED Final   Staphylococcus species DETECTED (A) NOT DETECTED Final    Comment: CRITICAL RESULT CALLED TO, READ BACK BY AND VERIFIED WITH: NATE COOKSON AT 0018 10/22/15.PMH    Staphylococcus aureus NOT DETECTED NOT DETECTED Final   Methicillin resistance NOT DETECTED NOT DETECTED Final   Streptococcus species NOT DETECTED NOT DETECTED Final   Streptococcus agalactiae  NOT DETECTED NOT DETECTED Final   Streptococcus pneumoniae NOT DETECTED NOT DETECTED Final   Streptococcus pyogenes NOT DETECTED NOT DETECTED Final   Acinetobacter baumannii NOT DETECTED NOT DETECTED Final   Enterobacteriaceae species NOT DETECTED NOT DETECTED Final   Enterobacter cloacae complex NOT DETECTED NOT DETECTED Final   Escherichia coli NOT DETECTED NOT DETECTED Final   Klebsiella oxytoca NOT DETECTED NOT DETECTED Final   Klebsiella pneumoniae NOT DETECTED NOT DETECTED Final   Proteus species NOT DETECTED NOT DETECTED Final   Serratia marcescens NOT DETECTED NOT DETECTED Final   Carbapenem resistance NOT DETECTED NOT DETECTED Final   Haemophilus influenzae NOT DETECTED NOT DETECTED Final   Neisseria meningitidis NOT DETECTED NOT DETECTED Final   Pseudomonas aeruginosa NOT DETECTED NOT DETECTED Final   Candida albicans NOT DETECTED NOT DETECTED Final   Candida glabrata NOT DETECTED NOT DETECTED Final   Candida krusei NOT DETECTED NOT DETECTED Final   Candida parapsilosis NOT DETECTED NOT DETECTED Final   Candida tropicalis NOT DETECTED NOT DETECTED Final  MRSA PCR Screening     Status: None   Collection Time: 10/20/15  4:29 PM  Result Value Ref Range Status   MRSA by PCR NEGATIVE NEGATIVE Final    Comment:        The GeneXpert MRSA Assay (FDA approved for NASAL specimens only), is one component of a comprehensive MRSA colonization surveillance program. It is not intended to diagnose MRSA infection nor to guide or monitor treatment for MRSA infections.   C difficile quick scan w PCR reflex     Status: None   Collection Time: 10/21/15  5:00 AM  Result Value Ref Range Status   C Diff antigen NEGATIVE NEGATIVE Final   C Diff toxin NEGATIVE NEGATIVE Final   C Diff interpretation Negative for C. difficile  Final    Coagulation Studies:  Recent Labs  10/20/15 1008  LABPROT 17.8*  INR 1.46    Urinalysis: No results for input(s): COLORURINE, LABSPEC, PHURINE,  GLUCOSEU, HGBUR, BILIRUBINUR, KETONESUR, PROTEINUR, UROBILINOGEN, NITRITE, LEUKOCYTESUR in the last 72 hours.  Invalid input(s): APPERANCEUR    Imaging: Dg Chest 2 View  10/20/2015  CLINICAL DATA:  Weakness in all extremities, cough, hypoxia, chills beginning yesterday after dialysis, vomited for 2 days, awake and alert, loose stools, end-stage renal disease, type II diabetes mellitus, stroke, CHF, former smoker, coronary artery disease EXAM: CHEST  2 VIEW COMPARISON:  05/03/2015 FINDINGS: Enlargement of cardiac silhouette with pulmonary vascular congestion. Mediastinal contours normal. Bronchitic changes with bibasilar atelectasis and chronic accentuation of interstitial markings, stable. No definite acute failure or consolidation. No pleural effusion or pneumothorax. Bones demineralized. IMPRESSION: Enlargement of cardiac silhouette with pulmonary vascular congestion. Bronchitic changes with bibasilar atelectasis.  Electronically Signed   By: Lavonia Dana M.D.   On: 10/20/2015 12:07     Medications:   . heparin 1,650 Units/hr (10/22/15 0622)   . aspirin EC  81 mg Oral QHS  . atorvastatin  40 mg Oral q1800  . feeding supplement (NEPRO CARB STEADY)  237 mL Oral BID BM  . insulin aspart  0-5 Units Subcutaneous QHS  . insulin aspart  0-9 Units Subcutaneous TID WC  . metoprolol tartrate  12.5 mg Oral BID  . piperacillin-tazobactam  3.375 g Intravenous Q12H  . sevelamer carbonate  3,200 mg Oral TID WC  . sodium chloride flush  3 mL Intravenous Q12H     Assessment/ Plan:  65 y.o.AA male with ESRD, Dialysis 2007-2008, DM-2, HTN, CVA, CAD  1. ESRD/ [facility] [place]/ MWF 2. Hyperkalemia 3. AOCKD 4. SHPTH 5. NSTEMI 6. Gram NEG sepsis (from outpatient dialysis)- full report pending  Plan: Next HD on Monday - Zosyn Tolerating HD well today     LOS: 2 Luis Walter 3/4/201711:51 AM

## 2015-10-22 NOTE — Progress Notes (Signed)
Pharmacy Antibiotic Follow-up Note  Luis Walter is a 65 y.o. year-old male admitted on 10/20/2015.  The patient is currently on day 2 of zosyn for gram negative organisms in dialysis.  Assessment/Plan: Lab reported GPC in aerobic bottle of 1 set. Spoke with Dr. Estanislado Pandy. We discussed possibility of contaminant. He wants to give the patient vancomycin 1 gm IV x 1 and follow up as more results become available.   Temp (24hrs), Avg:99.1 F (37.3 C), Min:98.4 F (36.9 C), Max:99.5 F (37.5 C)   Recent Labs Lab 10/20/15 1008 10/21/15 0805  WBC 18.3* 11.6*    Recent Labs Lab 10/20/15 1008 10/21/15 0805  CREATININE 6.33* 4.90*   Estimated Creatinine Clearance: 15.1 mL/min (by C-G formula based on Cr of 4.9).    No Known Allergies  Thank you for allowing pharmacy to be a part of this patient's care.  Laural Benes Pharm.D., BCPS Clinical Pharmacist 10/22/2015 4:48 AM

## 2015-10-23 DIAGNOSIS — R197 Diarrhea, unspecified: Secondary | ICD-10-CM

## 2015-10-23 DIAGNOSIS — I493 Ventricular premature depolarization: Secondary | ICD-10-CM

## 2015-10-23 DIAGNOSIS — A4151 Sepsis due to Escherichia coli [E. coli]: Secondary | ICD-10-CM

## 2015-10-23 LAB — BLOOD CULTURE ID PANEL (REFLEXED)
ACINETOBACTER BAUMANNII: NOT DETECTED
CANDIDA KRUSEI: NOT DETECTED
CANDIDA PARAPSILOSIS: NOT DETECTED
CARBAPENEM RESISTANCE: NOT DETECTED
Candida albicans: NOT DETECTED
Candida glabrata: NOT DETECTED
Candida tropicalis: NOT DETECTED
ENTEROBACTERIACEAE SPECIES: DETECTED — AB
ENTEROCOCCUS SPECIES: NOT DETECTED
Enterobacter cloacae complex: NOT DETECTED
Escherichia coli: DETECTED — AB
Haemophilus influenzae: NOT DETECTED
KLEBSIELLA OXYTOCA: NOT DETECTED
Klebsiella pneumoniae: NOT DETECTED
LISTERIA MONOCYTOGENES: NOT DETECTED
Methicillin resistance: NOT DETECTED
NEISSERIA MENINGITIDIS: NOT DETECTED
PSEUDOMONAS AERUGINOSA: NOT DETECTED
Proteus species: NOT DETECTED
SERRATIA MARCESCENS: NOT DETECTED
STAPHYLOCOCCUS AUREUS BCID: NOT DETECTED
STAPHYLOCOCCUS SPECIES: NOT DETECTED
STREPTOCOCCUS SPECIES: NOT DETECTED
Streptococcus agalactiae: NOT DETECTED
Streptococcus pneumoniae: NOT DETECTED
Streptococcus pyogenes: NOT DETECTED
VANCOMYCIN RESISTANCE: NOT DETECTED

## 2015-10-23 LAB — CBC
HCT: 30.6 % — ABNORMAL LOW (ref 40.0–52.0)
Hemoglobin: 10.2 g/dL — ABNORMAL LOW (ref 13.0–18.0)
MCH: 30.9 pg (ref 26.0–34.0)
MCHC: 33.3 g/dL (ref 32.0–36.0)
MCV: 92.7 fL (ref 80.0–100.0)
PLATELETS: 124 10*3/uL — AB (ref 150–440)
RBC: 3.31 MIL/uL — AB (ref 4.40–5.90)
RDW: 15.5 % — AB (ref 11.5–14.5)
WBC: 4.6 10*3/uL (ref 3.8–10.6)

## 2015-10-23 LAB — GLUCOSE, CAPILLARY
GLUCOSE-CAPILLARY: 122 mg/dL — AB (ref 65–99)
Glucose-Capillary: 86 mg/dL (ref 65–99)

## 2015-10-23 MED ORDER — ATORVASTATIN CALCIUM 40 MG PO TABS: 40.0000 mg | ORAL_TABLET | Freq: Every day | ORAL | Status: DC

## 2015-10-23 MED ORDER — LEVOFLOXACIN 500 MG PO TABS: 500.0000 mg | ORAL_TABLET | ORAL | Status: DC

## 2015-10-23 MED ORDER — NEPRO/CARBSTEADY PO LIQD: 237.0000 mL | Freq: Two times a day (BID) | ORAL | Status: AC

## 2015-10-23 MED ORDER — SODIUM CHLORIDE 0.9 % IV SOLN
500.0000 mg | INTRAVENOUS | Status: DC
  Administered 2015-10-23: 500 mg via INTRAVENOUS
  Filled 2015-10-23: qty 0.5

## 2015-10-23 MED ORDER — LEVOFLOXACIN 750 MG PO TABS: 750.0000 mg | ORAL_TABLET | Freq: Once | ORAL | Status: DC

## 2015-10-23 MED ORDER — ASPIRIN EC 81 MG PO TBEC: 81.0000 mg | DELAYED_RELEASE_TABLET | Freq: Every day | ORAL | Status: DC

## 2015-10-23 MED ORDER — LEVOFLOXACIN 500 MG PO TABS
500.0000 mg | ORAL_TABLET | Freq: Every day | ORAL | Status: DC
Start: 2015-10-23 — End: 2015-11-10

## 2015-10-23 MED ORDER — METOPROLOL TARTRATE 25 MG PO TABS: 12.5000 mg | ORAL_TABLET | Freq: Two times a day (BID) | ORAL | Status: DC

## 2015-10-23 NOTE — Progress Notes (Signed)
Patient converted to Afib and he's also having frequent PVCs. Patient does not have any history of Afib. Dr. Melynda Ripple notified but no new order received but to continue to monitor. Will continue to monitor.

## 2015-10-23 NOTE — Progress Notes (Signed)
Pharmacy Antibiotic Follow-up Note  Luis Walter is a 65 y.o. year-old male admitted on 10/20/2015.  The patient is currently on day 3 of Zosyn for organism in dialysis.  Assessment/Plan: Lab reported EColi in blood KPC not detected. Spoke with Dr. Estanislado Pandy, okay to switch to meropenem. Start meropenem 500 mg Q24H.  Temp (24hrs), Avg:98.6 F (37 C), Min:98.1 F (36.7 C), Max:98.9 F (37.2 C)   Recent Labs Lab 10/20/15 1008 10/21/15 0805 10/22/15 0441 10/23/15 0544  WBC 18.3* 11.6* 7.1 4.6    Recent Labs Lab 10/20/15 1008 10/21/15 0805  CREATININE 6.33* 4.90*   Estimated Creatinine Clearance: 16.4 mL/min (by C-G formula based on Cr of 4.9).    No Known Allergies    Thank you for allowing pharmacy to be a part of this patient's care.  Laural Benes PharmD 10/23/2015 7:31 AM

## 2015-10-23 NOTE — Discharge Summary (Signed)
[place] [place] [name]    MR#:  [id number]  DATE OF BIRTH:  1951-03-22  DATE OF ADMISSION:  10/20/2015 ADMITTING PHYSICIAN: Hillary Bow, MD  DATE OF DISCHARGE: No discharge date for patient encounter.  PRIMARY CARE PHYSICIAN: Amy L Krebs, NP     ADMISSION DIAGNOSIS:  Gastroenteritis [K52.9] Weakness [R53.1] Fever, unspecified fever cause [R50.9]  DISCHARGE DIAGNOSIS:  Principal Problem:   E. coli sepsis (HCC) Active Problems:   Chronic systolic CHF (congestive heart failure) (HCC)   CAD (coronary artery disease)   NSTEMI (non-ST elevated myocardial infarction) (HCC)   ESRD (end stage renal disease) ([place])   Diarrhea   SECONDARY DIAGNOSIS:   Past Medical History  Diagnosis Date  . ESRD (end stage renal disease) ([place])     a. on HD M,W,F; b. previously on transplant list at [facility] - removed 2016  . DM2 (diabetes mellitus, type 2) ([place])   . CAD (coronary artery disease)     a. cardiac cath 2014: LM nl, mLAD 30%, dLAD 30%, ostLCx 50%, mid ramus 20% OM3 80% pRCA 20%, mRCA 20%, PDA 100% w/ left to right collats, medically managed  b.10/2015: NSTEMI   . Stroke ([place])   . Chronic systolic CHF (congestive heart failure) ([place])     a. echo 12/2014: EF 35-40%, mod diffuse HK, RWMA cannot be excluded, LV diastolic fxn parameters nl, mild MR, moderately dilated LA, mildly dilated RV internal cavity size, mild TR, PASP nl  . Gallstones   . Dialysis patient ([place])     .pro HOSPITAL COURSE:  Patient is a 65 year old African-American male with past medical history significant for end stage renal disease, hypertension, diabetes, coronary artery disease who presents to the hospital with nausea, vomiting and diarrhea, fevers, leukocytosis up to 18,000, confusion. On arrival to the hospital patient was noted to have elevated troponin, maximum level was 4.73, decreasing after admission. Chest x-ray revealed bronchitic changes with  Bay basilar atelectasis, pulmonary vascular congestion. Lactic acid level was elevated to 2.2, platelet count was low at 118. Patient was also mildly: Hepatic with pro time of 17.8, INR of 1.46. Patient was admitted to the hospital for further evaluation. His influenza test was negative. MRSA PCR was negative. C. difficile test was negative. Blood cultures taken on admission to the hospital revealed a coagulase negative Staphylococcus and Escherichia coli. , Coagulase negative Staphylococcus was felt to be contamination. Patient was initially managed on broad-spectrum antibiotic therapy with Zosyn and vancomycin, antibiotics were changed to levofloxacin orally on the day of discharge. Patient's condition improved. His nausea, vomiting, resolved, as well as his diarrhea. He became stronger and was able to ambulate around is no additional help. Patient was advised to continue antibiotic therapy for 2 weeks. Patient had repeated blood cultures taken on 10/22/2015, negative for less than 24 hours by the day of discharge.  Discussion by problem: 1. NSTEMI: Cardiology input appreciated, patient completed 48-hour heparin infusion, echocardiogram revealed a mild left ventricular systolic dysfunction, which appeared to be improved from comparison echo in January 2016. There was trivial valvular disease and no evidence of pericardial effusion. No invasive cardiac evaluation was recommended by cardiologist. Patient was advised to continue aspirin, statin therapy. He was ambulated and had no chest pain on exertion 2. Escherichia coli sepsis: Based on patient's bacterial sensitivities, levofloxacin was advised to be continued for 2 weeks.  Patient's blood cultures on admission revealed gram-positive cocci, coagulase negative staph, suspected contamination,  repeated blood cultures and negative less than 24 hours.  3. Diarrhea-C. difficile negative , resolved 4.End-stage renal disease hemodialysis: Nephrology consult is  appreciated, hemodialysis per schedule 5. Venous thromboembolism prophylactic: Prophylactic heparin while in the hospital, ambulating  DISCHARGE CONDITIONS:   Stable  CONSULTS OBTAINED:  Treatment Team:  Minna Merritts, MD Murlean Iba, MD  DRUG ALLERGIES:  No Known Allergies  DISCHARGE MEDICATIONS:   Current Discharge Medication List    START taking these medications   Details  atorvastatin (LIPITOR) 40 MG tablet Take 1 tablet (40 mg total) by mouth daily at 6 PM. Qty: 30 tablet, Refills: 6    levofloxacin (LEVAQUIN) 500 MG tablet Take 1 tablet (500 mg total) by mouth daily. Patient sign. should be:  take one tablet,( 500 mg) every 48 hours at bedtime, thank you Qty: 7 tablet, Refills: 0    metoprolol tartrate (LOPRESSOR) 25 MG tablet Take 0.5 tablets (12.5 mg total) by mouth 2 (two) times daily. Qty: 60 tablet, Refills: 6    Nutritional Supplements (FEEDING SUPPLEMENT, NEPRO CARB STEADY,) LIQD Take 237 mLs by mouth 2 (two) times daily between meals. Qty: 60 Can, Refills: 6      CONTINUE these medications which have NOT CHANGED   Details  aspirin EC 81 MG tablet Take 81 mg by mouth at bedtime.    sevelamer carbonate (RENVELA) 800 MG tablet Take 3,200 mg by mouth 3 (three) times daily with meals.         DISCHARGE INSTRUCTIONS:    Patient is to follow-up with primary care physician, nephrologist as outpatient  If you experience worsening of your admission symptoms, develop shortness of breath, life threatening emergency, suicidal or homicidal thoughts you must seek medical attention immediately by calling 911 or calling your MD immediately  if symptoms less severe.  You Must read complete instructions/literature along with all the possible adverse reactions/side effects for all the Medicines you take and that have been prescribed to you. Take any new Medicines after you have completely understood and accept all the possible adverse reactions/side effects.    Please note  You were cared for by a hospitalist during your hospital stay. If you have any questions about your discharge medications or the care you received while you were in the hospital after you are discharged, you can call the unit and asked to speak with the hospitalist on call if the hospitalist that took care of you is not available. Once you are discharged, your primary care physician will handle any further medical issues. Please note that NO REFILLS for any discharge medications will be authorized once you are discharged, as it is imperative that you return to your primary care physician (or establish a relationship with a primary care physician if you do not have one) for your aftercare needs so that they can reassess your need for medications and monitor your lab values.    Today   CHIEF COMPLAINT:   Chief Complaint  Patient presents with  . Nausea  . Emesis    HISTORY OF PRESENT ILLNESS:  Luis Walter  is a 65 y.o. male with a known history of end stage renal disease, hypertension, diabetes, coronary artery disease who presents to the hospital with nausea, vomiting and diarrhea, fevers, leukocytosis up to 18,000, confusion. On arrival to the hospital patient was noted to have elevated troponin, maximum level was 4.73, decreasing after admission. Chest x-ray revealed bronchitic changes with Bay basilar atelectasis, pulmonary vascular congestion. Lactic acid level was elevated  to 2.2, platelet count was low at 118. Patient was also mildly: Hepatic with pro time of 17.8, INR of 1.46. Patient was admitted to the hospital for further evaluation. His influenza test was negative. MRSA PCR was negative. C. difficile test was negative. Blood cultures taken on admission to the hospital revealed a coagulase negative Staphylococcus and Escherichia coli. , Coagulase negative Staphylococcus was felt to be contamination. Patient was initially managed on broad-spectrum antibiotic therapy with  Zosyn and vancomycin, antibiotics were changed to levofloxacin orally on the day of discharge. Patient's condition improved. His nausea, vomiting, resolved, as well as his diarrhea. He became stronger and was able to ambulate around is no additional help. Patient was advised to continue antibiotic therapy for 2 weeks. Patient had repeated blood cultures taken on 10/22/2015, negative for less than 24 hours by the day of discharge.  Discussion by problem: 1. NSTEMI: Cardiology input appreciated, patient completed 48-hour heparin infusion, echocardiogram revealed a mild left ventricular systolic dysfunction, which appeared to be improved from comparison echo in January 2016. There was trivial valvular disease and no evidence of pericardial effusion. No invasive cardiac evaluation was recommended by cardiologist. Patient was advised to continue aspirin, statin therapy. He was ambulated and had no chest pain on exertion 2. Escherichia coli sepsis: Based on patient's bacterial sensitivities, levofloxacin was advised to be continued for 2 weeks.  Patient's blood cultures on admission revealed gram-positive cocci, coagulase negative staph, suspected contamination, repeated blood cultures and negative less than 24 hours.  3. Diarrhea-C. difficile negative , resolved 4.End-stage renal disease hemodialysis: Nephrology consult is appreciated, hemodialysis per schedule 5. Venous thromboembolism prophylactic: Prophylactic heparin while in the hospital, ambulating  DISCHARGE CONDITIONS:     VITAL SIGNS:  Blood pressure 100/52, pulse 76, temperature 98.1 F (36.7 C), temperature source Oral, resp. rate 18, height 5\' 8"  (1.727 m), weight 83.416 kg (183 lb 14.4 oz), SpO2 97 %.  I/O:   Intake/Output Summary (Last 24 hours) at 10/23/15 1316 Last data filed at 10/23/15 1004  Gross per 24 hour  Intake    510 ml  Output      0 ml  Net    510 ml    PHYSICAL EXAMINATION:  GENERAL:  65 y.o.-year-old patient lying  in the bed with no acute distress.  EYES: Pupils equal, round, reactive to light and accommodation. No scleral icterus. Extraocular muscles intact.  HEENT: Head atraumatic, normocephalic. Oropharynx and nasopharynx clear.  NECK:  Supple, no jugular venous distention. No thyroid enlargement, no tenderness.  LUNGS: Normal breath sounds bilaterally, no wheezing, rales,rhonchi or crepitation. No use of accessory muscles of respiration.  CARDIOVASCULAR: S1, S2 normal. No murmurs, rubs, or gallops.  ABDOMEN: Soft, non-tender, non-distended. Bowel sounds present. No organomegaly or mass.  EXTREMITIES: No pedal edema, cyanosis, or clubbing.  NEUROLOGIC: Cranial nerves II through XII are intact. Muscle strength 5/5 in all extremities. Sensation intact. Gait not checked.  PSYCHIATRIC: The patient is alert and oriented x 3.  SKIN: No obvious rash, lesion, or ulcer.   DATA REVIEW:   CBC  Recent Labs Lab 10/23/15 0544  WBC 4.6  HGB 10.2*  HCT 30.6*  PLT 124*    Chemistries   Recent Labs Lab 10/20/15 1008 10/21/15 0805  NA 141 141  K 5.6* 4.5  CL 97* 100*  CO2 32 32  GLUCOSE 134* 75  BUN 42* 36*  CREATININE 6.33* 4.90*  CALCIUM 8.6* 8.4*  AST 93*  --   ALT 41  --  ALKPHOS 134*  --   BILITOT 8.0*  --     Cardiac Enzymes  Recent Labs Lab 10/21/15 0053  TROPONINI 3.18*    Microbiology Results  Results for orders placed or performed during the hospital encounter of 10/20/15  Rapid Influenza A&B Antigens ([facility] only)     Status: None   Collection Time: 10/20/15 11:38 AM  Result Value Ref Range Status   Influenza A ([facility]) NEGATIVE NEGATIVE Final   Influenza B ([facility]) NEGATIVE NEGATIVE Final  Culture, blood (routine x 2)     Status: None (Preliminary result)   Collection Time: 10/20/15 12:20 PM  Result Value Ref Range Status   Specimen Description BLOOD RIGHT WRIST  Final   Special Requests BOTTLES DRAWN AEROBIC AND ANAEROBIC  1CC  Final   Culture  Setup Time   Final     GRAM NEGATIVE RODS AEROBIC BOTTLE ONLY CRITICAL RESULT CALLED TO, READ BACK BY AND VERIFIED WITH: [place] ON 10/23/15 AT 0429 BY TLB CONFIRMED BY TLB/RWW    Culture   Final    GRAM NEGATIVE RODS AEROBIC BOTTLE ONLY IDENTIFICATION TO FOLLOW    Report Status PENDING  Incomplete  Blood Culture ID Panel (Reflexed)     Status: Abnormal   Collection Time: 10/20/15 12:20 PM  Result Value Ref Range Status   Enterococcus species NOT DETECTED NOT DETECTED Final   Vancomycin resistance NOT DETECTED NOT DETECTED Final   Listeria monocytogenes NOT DETECTED NOT DETECTED Final   Staphylococcus species NOT DETECTED NOT DETECTED Final   Staphylococcus aureus NOT DETECTED NOT DETECTED Final   Methicillin resistance NOT DETECTED NOT DETECTED Final   Streptococcus species NOT DETECTED NOT DETECTED Final   Streptococcus agalactiae NOT DETECTED NOT DETECTED Final   Streptococcus pneumoniae NOT DETECTED NOT DETECTED Final   Streptococcus pyogenes NOT DETECTED NOT DETECTED Final   Acinetobacter baumannii NOT DETECTED NOT DETECTED Final   Enterobacteriaceae species DETECTED (A) NOT DETECTED Final    Comment: CRITICAL RESULT CALLED TO, READ BACK BY AND VERIFIED WITH: NATE COOKSON ON 10/23/15 AT 0429 BY TLB    Enterobacter cloacae complex NOT DETECTED NOT DETECTED Corrected    Comment: CORRECTED ON 03/05 AT [id number]: PREVIOUSLY REPORTED AS DETECTED CRITICAL RESULT CALLED TO, READ BACK BY AND VERIFIED WITH: NATE COOKSON ON 10/23/15 AT 0429 BY TLB   Escherichia coli DETECTED (A) NOT DETECTED Corrected    Comment: CRITICAL RESULT CALLED TO, READ BACK BY AND VERIFIED WITH: NATE COOKSON ON 10/23/15 AT 0429 BY TLB CORRECTED ON 03/05 AT [id number]: PREVIOUSLY REPORTED AS NOT DETECTED    Klebsiella oxytoca NOT DETECTED NOT DETECTED Final   Klebsiella pneumoniae NOT DETECTED NOT DETECTED Final   Proteus species NOT DETECTED NOT DETECTED Final   Serratia marcescens NOT DETECTED NOT DETECTED Final   Carbapenem resistance NOT  DETECTED NOT DETECTED Final   Haemophilus influenzae NOT DETECTED NOT DETECTED Final   Neisseria meningitidis NOT DETECTED NOT DETECTED Final   Pseudomonas aeruginosa NOT DETECTED NOT DETECTED Final   Candida albicans NOT DETECTED NOT DETECTED Final   Candida glabrata NOT DETECTED NOT DETECTED Final   Candida krusei NOT DETECTED NOT DETECTED Final   Candida parapsilosis NOT DETECTED NOT DETECTED Final   Candida tropicalis NOT DETECTED NOT DETECTED Final  Culture, blood (routine x 2)     Status: None (Preliminary result)   Collection Time: 10/20/15 12:30 PM  Result Value Ref Range Status   Specimen Description BLOOD RIGHT HAND  Final   Special Requests BOTTLES DRAWN  AEROBIC AND ANAEROBIC 1CC  Final   Culture  Setup Time   Final    GRAM POSITIVE COCCI AEROBIC BOTTLE ONLY CRITICAL RESULT CALLED TO, READ BACK BY AND VERIFIED WITH: NATE COOKSON AT 0018 10/22/15.PMH CONFIRMED BY RWW    Culture   Final    COAGULASE NEGATIVE STAPHYLOCOCCUS AEROBIC BOTTLE ONLY Results consistent with contamination.    Report Status PENDING  Incomplete  Blood Culture ID Panel (Reflexed)     Status: Abnormal   Collection Time: 10/20/15 12:30 PM  Result Value Ref Range Status   Enterococcus species NOT DETECTED NOT DETECTED Final   Vancomycin resistance NOT DETECTED NOT DETECTED Final   Listeria monocytogenes NOT DETECTED NOT DETECTED Final   Staphylococcus species DETECTED (A) NOT DETECTED Final    Comment: CRITICAL RESULT CALLED TO, READ BACK BY AND VERIFIED WITH: NATE COOKSON AT 0018 10/22/15.PMH    Staphylococcus aureus NOT DETECTED NOT DETECTED Final   Methicillin resistance NOT DETECTED NOT DETECTED Final   Streptococcus species NOT DETECTED NOT DETECTED Final   Streptococcus agalactiae NOT DETECTED NOT DETECTED Final   Streptococcus pneumoniae NOT DETECTED NOT DETECTED Final   Streptococcus pyogenes NOT DETECTED NOT DETECTED Final   Acinetobacter baumannii NOT DETECTED NOT DETECTED Final    Enterobacteriaceae species NOT DETECTED NOT DETECTED Final   Enterobacter cloacae complex NOT DETECTED NOT DETECTED Final   Escherichia coli NOT DETECTED NOT DETECTED Final   Klebsiella oxytoca NOT DETECTED NOT DETECTED Final   Klebsiella pneumoniae NOT DETECTED NOT DETECTED Final   Proteus species NOT DETECTED NOT DETECTED Final   Serratia marcescens NOT DETECTED NOT DETECTED Final   Carbapenem resistance NOT DETECTED NOT DETECTED Final   Haemophilus influenzae NOT DETECTED NOT DETECTED Final   Neisseria meningitidis NOT DETECTED NOT DETECTED Final   Pseudomonas aeruginosa NOT DETECTED NOT DETECTED Final   Candida albicans NOT DETECTED NOT DETECTED Final   Candida glabrata NOT DETECTED NOT DETECTED Final   Candida krusei NOT DETECTED NOT DETECTED Final   Candida parapsilosis NOT DETECTED NOT DETECTED Final   Candida tropicalis NOT DETECTED NOT DETECTED Final  MRSA PCR Screening     Status: None   Collection Time: 10/20/15  4:29 PM  Result Value Ref Range Status   MRSA by PCR NEGATIVE NEGATIVE Final    Comment:        The GeneXpert MRSA Assay (FDA approved for NASAL specimens only), is one component of a comprehensive MRSA colonization surveillance program. It is not intended to diagnose MRSA infection nor to guide or monitor treatment for MRSA infections.   C difficile quick scan w PCR reflex     Status: None   Collection Time: 10/21/15  5:00 AM  Result Value Ref Range Status   C Diff antigen NEGATIVE NEGATIVE Final   C Diff toxin NEGATIVE NEGATIVE Final   C Diff interpretation Negative for C. difficile  Final  CULTURE, BLOOD (ROUTINE X 2) w Reflex to PCR ID Panel     Status: None (Preliminary result)   Collection Time: 10/22/15  8:38 AM  Result Value Ref Range Status   Specimen Description BLOOD RIGHT ANTECUBITAL  Final   Special Requests BOTTLES DRAWN AEROBIC AND ANAEROBIC  [place]  Final   Culture NO GROWTH < 24 HOURS  Final   Report Status PENDING  Incomplete  CULTURE,  BLOOD (ROUTINE X 2) w Reflex to PCR ID Panel     Status: None (Preliminary result)   Collection Time: 10/22/15  8:53 AM  Result  Value Ref Range Status   Specimen Description BLOOD RIGHT HAND  Final   Special Requests BOTTLES DRAWN AEROBIC AND ANAEROBIC  8CC  Final   Culture NO GROWTH < 24 HOURS  Final   Report Status PENDING  Incomplete    RADIOLOGY:  No results found.  EKG:   Orders placed or performed during the hospital encounter of 10/20/15  . EKG 12-Lead  . EKG 12-Lead      Management plans discussed with the patient, family and they are in agreement.  CODE STATUS:     Code Status Orders        Start     Ordered   10/20/15 1349  Full code   Continuous     10/20/15 1349    Code Status History    Date Active Date Inactive Code Status Order ID Comments User Context   03/04/2015 10:53 PM 03/07/2015  6:49 PM Full Code [id number]  Lytle Butte, MD ED   01/23/2015 10:51 PM 01/26/2015  8:58 PM Full Code [id number]  Lytle Butte, MD ED   12/25/2014  1:22 PM 01/01/2015  5:28 PM Full Code [id number]  Vaughan Basta, MD Inpatient      TOTAL TIME TAKING CARE OF THIS PATIENT: 40 minutes.    Theodoro Grist M.D on 10/23/2015 at 1:16 PM  Between 7am to 6pm - Pager - [phone]  After 6pm go to [email or web address] - password [email or web address] [facility]  [place] [facility]  Office  [phone]  CC: Primary care physician; Amy Annamary Rummage, NP

## 2015-10-23 NOTE — Progress Notes (Signed)
SUBJECTIVE: No complaints. No nausea, vomiting. No chest pain.   Tele: sinus, PVCs  BP 93/54 mmHg  Pulse 74  Temp(Src) 98.1 F (36.7 C) (Oral)  Resp 18  Ht 5\' 8"  (1.727 m)  Wt 183 lb 14.4 oz (83.416 kg)  BMI 27.97 kg/m2  SpO2 100%  Intake/Output Summary (Last 24 hours) at 10/23/15 [id number] Last data filed at 10/23/15 0700  Gross per 24 hour  Intake    150 ml  Output   1000 ml  Net   -850 ml    PHYSICAL EXAM General: Well developed, well nourished, in no acute distress. Alert and oriented x 3.  Psych:  Good affect, responds appropriately Neck: No JVD. No masses noted.  Lungs: Clear bilaterally with no wheezes or rhonci noted.  Heart: RRR with no murmurs noted. Abdomen: Bowel sounds are present. Soft, non-tender.  Extremities: No lower extremity edema.   LABS: Basic Metabolic Panel:  Recent Labs  10/20/15 1008 10/21/15 0805  NA 141 141  K 5.6* 4.5  CL 97* 100*  CO2 32 32  GLUCOSE 134* 75  BUN 42* 36*  CREATININE 6.33* 4.90*  CALCIUM 8.6* 8.4*   CBC:  Recent Labs  10/22/15 0441 10/23/15 0544  WBC 7.1 4.6  NEUTROABS 5.7  --   HGB 10.2* 10.2*  HCT 31.0* 30.6*  MCV 92.6 92.7  PLT 112* 124*   Cardiac Enzymes:  Recent Labs  10/20/15 1440 10/20/15 2122 10/21/15 0053  TROPONINI 4.73* 3.36* 3.18*   Fasting Lipid Panel:  Recent Labs  10/21/15 0805  CHOL 102  HDL 29*  LDLCALC 55  TRIG 88  CHOLHDL 3.5    Current Meds: . aspirin EC  81 mg Oral QHS  . atorvastatin  40 mg Oral q1800  . feeding supplement (NEPRO CARB STEADY)  237 mL Oral BID BM  . heparin subcutaneous  5,000 Units Subcutaneous 3 times per day  . insulin aspart  0-5 Units Subcutaneous QHS  . insulin aspart  0-9 Units Subcutaneous TID WC  . meropenem (MERREM) IV  500 mg Intravenous Q24H  . metoprolol tartrate  12.5 mg Oral BID  . sevelamer carbonate  3,200 mg Oral TID WC  . sodium chloride flush  3 mL Intravenous Q12H     ASSESSMENT AND PLAN:  1. Elevated  troponin/History of CAD: He has elevated troponin but no chest pain or ischemic EKG changes. Elevated troponin is likely due to demand ischemia in setting of acute viral/bacterial illness in setting of ESRD. He is known to have CAD with last cath in 2014 with 100% total occlusion of PDA, normal LM, 30% mLAD, 30% dLAD, 50% LCx, 20% mid-[place], 80% OM3, 20% pRCA and mRCA. Stress test 01/2014 showed severe hypokinesis of the inferior wall and mild hypokinesis of the lateral wall. He had previously been on Lipitor, Hydralazine, Imdur, and Lopressor. His wife reported these were discontinued over 3 months ago due to hypotension during HD. He is now back on Lopressor 12.5 mg po BID. Statin has been restarted by primary team. Continue ASA, Lopressor. He completed 48 hours of heparin IV drip. I have personally reviewed his echo images. The echo image quality is poor but demonstrates mild LV systolic dysfunction which appears to be improved from comparison echo January 2016. There is trivial valve disease and no evidence of pericardial effusion. The echo will be officially read by [facility] on Monday.  No invasive cardiac evaluation planned at this time.   2. Chronic Systolic  CHF: He does not appear to be volume overloaded on physical exam. Continue beta blocker.   3. ESRD: HD on Monday, Wednesday, Friday schedule. Nephrology following.   4. PVCs: I have reviewed the last 24 hours of telemetry. I do not see evidence of atrial fibrillation as reported in the chart.    MCALHANY,CHRISTOPHER  3/5/20179:22 AM

## 2015-10-23 NOTE — Progress Notes (Signed)
Subjective:  Late Entry Patient feels better today Asking about discharge Outpatient culture report reviewed   Objective:  Vital signs in last 24 hours:  Temp:  [98.1 F (36.7 C)-98.7 F (37.1 C)] 98.1 F (36.7 C) (03/05 1136) Pulse Rate:  [74-79] 76 (03/05 1136) Resp:  [18] 18 (03/05 1136) BP: (93-103)/(52-59) 100/52 mmHg (03/05 1136) SpO2:  [88 %-100 %] 97 % (03/05 1136) Weight:  [83.416 kg (183 lb 14.4 oz)-84.188 kg (185 lb 9.6 oz)] 83.416 kg (183 lb 14.4 oz) (03/05 0514)  Weight change: 0.2 kg (7.1 oz) Filed Weights   10/22/15 1234 10/22/15 1431 10/23/15 0514  Weight: 80.6 kg (177 lb 11.1 oz) 84.188 kg (185 lb 9.6 oz) 83.416 kg (183 lb 14.4 oz)    Intake/Output:    Intake/Output Summary (Last 24 hours) at 10/23/15 1408 Last data filed at 10/23/15 1004  Gross per 24 hour  Intake    510 ml  Output      0 ml  Net    510 ml     Physical Exam: General: NAD,   HEENT Muddy sclera  Neck supple  Pulm/lungs clear  CVS/Heart No rub  Abdomen:  Soft, NT  Extremities: No edema  Neurologic: Resting quietly  Skin: No rashes  Access: Left forearm AVF       Basic Metabolic Panel:   Recent Labs Lab 10/20/15 1008 10/21/15 0805  NA 141 141  K 5.6* 4.5  CL 97* 100*  CO2 32 32  GLUCOSE 134* 75  BUN 42* 36*  CREATININE 6.33* 4.90*  CALCIUM 8.6* 8.4*     CBC:  Recent Labs Lab 10/20/15 1008 10/21/15 0805 10/22/15 0441 10/23/15 0544  WBC 18.3* 11.6* 7.1 4.6  NEUTROABS  --   --  5.7  --   HGB 11.4* 10.4* 10.2* 10.2*  HCT 35.1* 31.7* 31.0* 30.6*  MCV 91.5 91.3 92.6 92.7  PLT 118* 113* 112* 124*      Microbiology:  Recent Results (from the past 720 hour(s))  Rapid Influenza A&B Antigens ([facility] only)     Status: None   Collection Time: 10/20/15 11:38 AM  Result Value Ref Range Status   Influenza A ([facility]) NEGATIVE NEGATIVE Final   Influenza B ([facility]) NEGATIVE NEGATIVE Final  Culture, blood (routine x 2)     Status: None (Preliminary result)    Collection Time: 10/20/15 12:20 PM  Result Value Ref Range Status   Specimen Description BLOOD RIGHT WRIST  Final   Special Requests BOTTLES DRAWN AEROBIC AND ANAEROBIC  1CC  Final   Culture  Setup Time   Final    GRAM NEGATIVE RODS AEROBIC BOTTLE ONLY CRITICAL RESULT CALLED TO, READ BACK BY AND VERIFIED WITH: NATE COOKSON ON 10/23/15 AT 0429 BY TLB CONFIRMED BY TLB/RWW    Culture   Final    GRAM NEGATIVE RODS AEROBIC BOTTLE ONLY IDENTIFICATION TO FOLLOW    Report Status PENDING  Incomplete  Blood Culture ID Panel (Reflexed)     Status: Abnormal   Collection Time: 10/20/15 12:20 PM  Result Value Ref Range Status   Enterococcus species NOT DETECTED NOT DETECTED Final   Vancomycin resistance NOT DETECTED NOT DETECTED Final   Listeria monocytogenes NOT DETECTED NOT DETECTED Final   Staphylococcus species NOT DETECTED NOT DETECTED Final   Staphylococcus aureus NOT DETECTED NOT DETECTED Final   Methicillin resistance NOT DETECTED NOT DETECTED Final   Streptococcus species NOT DETECTED NOT DETECTED Final   Streptococcus agalactiae NOT DETECTED NOT DETECTED Final  Streptococcus pneumoniae NOT DETECTED NOT DETECTED Final   Streptococcus pyogenes NOT DETECTED NOT DETECTED Final   Acinetobacter baumannii NOT DETECTED NOT DETECTED Final   Enterobacteriaceae species DETECTED (A) NOT DETECTED Final    Comment: CRITICAL RESULT CALLED TO, READ BACK BY AND VERIFIED WITH: NATE COOKSON ON 10/23/15 AT 0429 BY TLB    Enterobacter cloacae complex NOT DETECTED NOT DETECTED Corrected    Comment: CORRECTED ON 03/05 AT [id number]: PREVIOUSLY REPORTED AS DETECTED CRITICAL RESULT CALLED TO, READ BACK BY AND VERIFIED WITH: NATE COOKSON ON 10/23/15 AT 0429 BY TLB   Escherichia coli DETECTED (A) NOT DETECTED Corrected    Comment: CRITICAL RESULT CALLED TO, READ BACK BY AND VERIFIED WITH: NATE COOKSON ON 10/23/15 AT 0429 BY TLB CORRECTED ON 03/05 AT [id number]: PREVIOUSLY REPORTED AS NOT DETECTED    Klebsiella oxytoca NOT  DETECTED NOT DETECTED Final   Klebsiella pneumoniae NOT DETECTED NOT DETECTED Final   Proteus species NOT DETECTED NOT DETECTED Final   Serratia marcescens NOT DETECTED NOT DETECTED Final   Carbapenem resistance NOT DETECTED NOT DETECTED Final   Haemophilus influenzae NOT DETECTED NOT DETECTED Final   Neisseria meningitidis NOT DETECTED NOT DETECTED Final   Pseudomonas aeruginosa NOT DETECTED NOT DETECTED Final   Candida albicans NOT DETECTED NOT DETECTED Final   Candida glabrata NOT DETECTED NOT DETECTED Final   Candida krusei NOT DETECTED NOT DETECTED Final   Candida parapsilosis NOT DETECTED NOT DETECTED Final   Candida tropicalis NOT DETECTED NOT DETECTED Final  Culture, blood (routine x 2)     Status: None (Preliminary result)   Collection Time: 10/20/15 12:30 PM  Result Value Ref Range Status   Specimen Description BLOOD RIGHT HAND  Final   Special Requests BOTTLES DRAWN AEROBIC AND ANAEROBIC 1CC  Final   Culture  Setup Time   Final    GRAM POSITIVE COCCI AEROBIC BOTTLE ONLY CRITICAL RESULT CALLED TO, READ BACK BY AND VERIFIED WITH: NATE COOKSON AT 0018 10/22/15.PMH CONFIRMED BY RWW    Culture   Final    COAGULASE NEGATIVE STAPHYLOCOCCUS AEROBIC BOTTLE ONLY Results consistent with contamination.    Report Status PENDING  Incomplete  Blood Culture ID Panel (Reflexed)     Status: Abnormal   Collection Time: 10/20/15 12:30 PM  Result Value Ref Range Status   Enterococcus species NOT DETECTED NOT DETECTED Final   Vancomycin resistance NOT DETECTED NOT DETECTED Final   Listeria monocytogenes NOT DETECTED NOT DETECTED Final   Staphylococcus species DETECTED (A) NOT DETECTED Final    Comment: CRITICAL RESULT CALLED TO, READ BACK BY AND VERIFIED WITH: NATE COOKSON AT 0018 10/22/15.PMH    Staphylococcus aureus NOT DETECTED NOT DETECTED Final   Methicillin resistance NOT DETECTED NOT DETECTED Final   Streptococcus species NOT DETECTED NOT DETECTED Final   Streptococcus agalactiae  NOT DETECTED NOT DETECTED Final   Streptococcus pneumoniae NOT DETECTED NOT DETECTED Final   Streptococcus pyogenes NOT DETECTED NOT DETECTED Final   Acinetobacter baumannii NOT DETECTED NOT DETECTED Final   Enterobacteriaceae species NOT DETECTED NOT DETECTED Final   Enterobacter cloacae complex NOT DETECTED NOT DETECTED Final   Escherichia coli NOT DETECTED NOT DETECTED Final   Klebsiella oxytoca NOT DETECTED NOT DETECTED Final   Klebsiella pneumoniae NOT DETECTED NOT DETECTED Final   Proteus species NOT DETECTED NOT DETECTED Final   Serratia marcescens NOT DETECTED NOT DETECTED Final   Carbapenem resistance NOT DETECTED NOT DETECTED Final   Haemophilus influenzae NOT DETECTED NOT DETECTED Final   Neisseria  meningitidis NOT DETECTED NOT DETECTED Final   Pseudomonas aeruginosa NOT DETECTED NOT DETECTED Final   Candida albicans NOT DETECTED NOT DETECTED Final   Candida glabrata NOT DETECTED NOT DETECTED Final   Candida krusei NOT DETECTED NOT DETECTED Final   Candida parapsilosis NOT DETECTED NOT DETECTED Final   Candida tropicalis NOT DETECTED NOT DETECTED Final  MRSA PCR Screening     Status: None   Collection Time: 10/20/15  4:29 PM  Result Value Ref Range Status   MRSA by PCR NEGATIVE NEGATIVE Final    Comment:        The GeneXpert MRSA Assay (FDA approved for NASAL specimens only), is one component of a comprehensive MRSA colonization surveillance program. It is not intended to diagnose MRSA infection nor to guide or monitor treatment for MRSA infections.   C difficile quick scan w PCR reflex     Status: None   Collection Time: 10/21/15  5:00 AM  Result Value Ref Range Status   C Diff antigen NEGATIVE NEGATIVE Final   C Diff toxin NEGATIVE NEGATIVE Final   C Diff interpretation Negative for C. difficile  Final  CULTURE, BLOOD (ROUTINE X 2) w Reflex to PCR ID Panel     Status: None (Preliminary result)   Collection Time: 10/22/15  8:38 AM  Result Value Ref Range Status    Specimen Description BLOOD RIGHT ANTECUBITAL  Final   Special Requests BOTTLES DRAWN AEROBIC AND ANAEROBIC  [place]  Final   Culture NO GROWTH < 24 HOURS  Final   Report Status PENDING  Incomplete  CULTURE, BLOOD (ROUTINE X 2) w Reflex to PCR ID Panel     Status: None (Preliminary result)   Collection Time: 10/22/15  8:53 AM  Result Value Ref Range Status   Specimen Description BLOOD RIGHT HAND  Final   Special Requests BOTTLES DRAWN AEROBIC AND ANAEROBIC  8CC  Final   Culture NO GROWTH < 24 HOURS  Final   Report Status PENDING  Incomplete    Coagulation Studies: No results for input(s): LABPROT, INR in the last 72 hours.  Urinalysis: No results for input(s): COLORURINE, LABSPEC, PHURINE, GLUCOSEU, HGBUR, BILIRUBINUR, KETONESUR, PROTEINUR, UROBILINOGEN, NITRITE, LEUKOCYTESUR in the last 72 hours.  Invalid input(s): APPERANCEUR    Imaging: No results found.   Medications:     . aspirin EC  81 mg Oral QHS  . atorvastatin  40 mg Oral q1800  . feeding supplement (NEPRO CARB STEADY)  237 mL Oral BID BM  . heparin subcutaneous  5,000 Units Subcutaneous 3 times per day  . insulin aspart  0-5 Units Subcutaneous QHS  . insulin aspart  0-9 Units Subcutaneous TID WC  . [START ON 10/25/2015] levofloxacin  500 mg Oral Q48H  . levofloxacin  750 mg Oral Once  . meropenem (MERREM) IV  500 mg Intravenous Q24H  . metoprolol tartrate  12.5 mg Oral BID  . sevelamer carbonate  3,200 mg Oral TID WC  . sodium chloride flush  3 mL Intravenous Q12H     Assessment/ Plan:  65 y.o.AA male with ESRD, Dialysis 2007-2008, DM-2, HTN, CVA, CAD  1. ESRD/ [facility]/ MWF 2. Hyperkalemia 3. Anemia Of CKD 4. SHPTH 5. NSTEMI 6. E Coli sepsis (from outpatient dialysis)-    PLAN: Expected to be discharge today: Next HD on Monday  - Treat with Levofloxacin       LOS: 3 Peachie Barkalow 3/5/20172:08 PM

## 2015-10-23 NOTE — Care Management Note (Signed)
Case Management Note  Patient Details  [name] MRN: [id number] Date of Birth: 10-01-1950  Subjective/Objective:  Discussed discharge planning with Dr Ether Griffins. No home health services needed and all antibiotics will be oral.                   Action/Plan:   Expected Discharge Date:                  Expected Discharge Plan:     In-House Referral:     Discharge planning Services     Post Acute Care Choice:    Choice offered to:     DME Arranged:    DME Agency:     HH Arranged:    [place] Agency:     Status of Service:     Medicare Important Message Given:    Date Medicare IM Given:    Medicare IM give by:    Date Additional Medicare IM Given:    Additional Medicare Important Message give by:     If discussed at [place] of Stay Meetings, dates discussed:    Additional Comments:  Luis Walter A, RN 10/23/2015, 11:51 AM

## 2015-10-24 DIAGNOSIS — Z5181 Encounter for therapeutic drug level monitoring: Secondary | ICD-10-CM | POA: Diagnosis not present

## 2015-10-24 DIAGNOSIS — R6883 Chills (without fever): Secondary | ICD-10-CM | POA: Diagnosis not present

## 2015-10-24 DIAGNOSIS — D508 Other iron deficiency anemias: Secondary | ICD-10-CM | POA: Diagnosis not present

## 2015-10-24 DIAGNOSIS — R197 Diarrhea, unspecified: Secondary | ICD-10-CM | POA: Diagnosis not present

## 2015-10-24 DIAGNOSIS — N186 End stage renal disease: Secondary | ICD-10-CM | POA: Diagnosis not present

## 2015-10-24 DIAGNOSIS — N2581 Secondary hyperparathyroidism of renal origin: Secondary | ICD-10-CM | POA: Diagnosis not present

## 2015-10-25 ENCOUNTER — Telehealth: Payer: Self-pay | Admitting: Cardiovascular Disease

## 2015-10-25 LAB — CULTURE, BLOOD (ROUTINE X 2)

## 2015-10-25 NOTE — Telephone Encounter (Signed)
Patient contacted regarding discharge from [facility] on 10/23/15.  Patient understands to follow up with provider Fletcher Anon on March 10 at 3:30pm at [facility]. Patient understands discharge instructions? yes Patient understands medications and regiment? yes Patient understands to bring all medications to this visit? yes  Pt states "I have some things going on" and is unable to commit to March 10 appt. He would like Luis Walter to call him Friday to reschedule another post hospital visit with Dr. Fletcher Anon. Forward to scheduling.

## 2015-10-26 DIAGNOSIS — R197 Diarrhea, unspecified: Secondary | ICD-10-CM | POA: Diagnosis not present

## 2015-10-26 DIAGNOSIS — N186 End stage renal disease: Secondary | ICD-10-CM | POA: Diagnosis not present

## 2015-10-26 DIAGNOSIS — D508 Other iron deficiency anemias: Secondary | ICD-10-CM | POA: Diagnosis not present

## 2015-10-26 DIAGNOSIS — R6883 Chills (without fever): Secondary | ICD-10-CM | POA: Diagnosis not present

## 2015-10-26 DIAGNOSIS — Z5181 Encounter for therapeutic drug level monitoring: Secondary | ICD-10-CM | POA: Diagnosis not present

## 2015-10-26 DIAGNOSIS — N2581 Secondary hyperparathyroidism of renal origin: Secondary | ICD-10-CM | POA: Diagnosis not present

## 2015-10-27 LAB — CULTURE, BLOOD (ROUTINE X 2)
CULTURE: NO GROWTH
Culture: NO GROWTH

## 2015-10-28 ENCOUNTER — Ambulatory Visit: Payer: Medicare Other | Admitting: Cardiovascular Disease

## 2015-10-28 DIAGNOSIS — R6883 Chills (without fever): Secondary | ICD-10-CM | POA: Diagnosis not present

## 2015-10-28 DIAGNOSIS — Z5181 Encounter for therapeutic drug level monitoring: Secondary | ICD-10-CM | POA: Diagnosis not present

## 2015-10-28 DIAGNOSIS — R197 Diarrhea, unspecified: Secondary | ICD-10-CM | POA: Diagnosis not present

## 2015-10-28 DIAGNOSIS — D508 Other iron deficiency anemias: Secondary | ICD-10-CM | POA: Diagnosis not present

## 2015-10-28 DIAGNOSIS — N2581 Secondary hyperparathyroidism of renal origin: Secondary | ICD-10-CM | POA: Diagnosis not present

## 2015-10-28 DIAGNOSIS — N186 End stage renal disease: Secondary | ICD-10-CM | POA: Diagnosis not present

## 2015-10-31 DIAGNOSIS — R6883 Chills (without fever): Secondary | ICD-10-CM | POA: Diagnosis not present

## 2015-10-31 DIAGNOSIS — Z5181 Encounter for therapeutic drug level monitoring: Secondary | ICD-10-CM | POA: Diagnosis not present

## 2015-10-31 DIAGNOSIS — R197 Diarrhea, unspecified: Secondary | ICD-10-CM | POA: Diagnosis not present

## 2015-10-31 DIAGNOSIS — D508 Other iron deficiency anemias: Secondary | ICD-10-CM | POA: Diagnosis not present

## 2015-10-31 DIAGNOSIS — N186 End stage renal disease: Secondary | ICD-10-CM | POA: Diagnosis not present

## 2015-10-31 DIAGNOSIS — N2581 Secondary hyperparathyroidism of renal origin: Secondary | ICD-10-CM | POA: Diagnosis not present

## 2015-11-01 ENCOUNTER — Encounter: Payer: Self-pay | Admitting: Cardiovascular Disease

## 2015-11-01 ENCOUNTER — Ambulatory Visit (INDEPENDENT_AMBULATORY_CARE_PROVIDER_SITE_OTHER): Payer: Medicare Other | Admitting: Cardiovascular Disease

## 2015-11-01 VITALS — BP 118/64 | HR 85 | Ht 68.0 in | Wt 183.0 lb

## 2015-11-01 DIAGNOSIS — I5022 Chronic systolic (congestive) heart failure: Secondary | ICD-10-CM

## 2015-11-01 DIAGNOSIS — I2510 Atherosclerotic heart disease of native coronary artery without angina pectoris: Secondary | ICD-10-CM | POA: Diagnosis not present

## 2015-11-01 NOTE — Progress Notes (Signed)
Cardiology Office Note   Date:  11/01/2015   ID:  Luis Walter, DOB June 05, 1951, MRN [id number]  PCP:  [name], NP  Cardiologist:   Kathlyn Sacramento, MD   Chief Complaint  Patient presents with  . other    Follow up from [facility]. Meds reviewed by the patient verbally. "doing well."       History of Present Illness: Luis Walter is a 65 y.o. male who presents for A follow-up visit regarding coronary artery disease and chronic systolic heart failure. He is here today after recent hospitalization at [facility] with bacteremia non-ST elevation myocardial infarction.  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 He was hospitalized in May 2016 with sepsis, GNR bacteremia, and new onset anemia. Telemetry showed frequent ectopy with sinus tachycardia, frequent PVCs, PACs, and blocked PACs. He had another hospitalization in June for diarrhea.  Previous echocardiogram in May 2016 showed an ejection fraction of 35-40% with global hypokinesis, mild mitral regurgitation and mild tricuspid regurgitation with normal pulmonary pressure. He was started on hydralazine and isosorbide but these were discontinued due to hypotension especially given dialysis. He was hospitalized again recently at [facility] with sepsis group due to Escherichia coli. He was found to have mildly elevated troponin which peaked at 4. He was confused and did not have any chest pain. He had a repeat echocardiogram which showed an ejection fraction of 25-30% with global hypokinesis. The patient was treated successfully with antibiotics with improvement. He is overall feeling better and denies any chest pain or shortness of breath at the present time.  Past Medical History  Diagnosis Date  . ESRD (end stage renal disease) ([place])     a. on HD M,W,F; b. previously  on transplant list at [facility] - removed 2016  . DM2 (diabetes mellitus, type 2) ([place])   . CAD (coronary artery disease)     a. cardiac cath 2014: LM nl, mLAD 30%, dLAD 30%, ostLCx 50%, mid ramus 20% OM3 80% pRCA 20%, mRCA 20%, PDA 100% w/ left to right collats, medically managed  b.10/2015: NSTEMI   . Stroke ([place])   . Chronic systolic CHF (congestive heart failure) ([place])     a. echo 12/2014: EF 35-40%, mod diffuse HK, RWMA cannot be excluded, LV diastolic fxn parameters nl, mild MR, moderately dilated LA, mildly dilated RV internal cavity size, mild TR, PASP nl  . Gallstones   . Dialysis patient [facility])     Past Surgical History  Procedure Laterality Date  . Av fistula placement Left   . Colonoscopy with propofol N/A 05/31/2015    Procedure: COLONOSCOPY WITH PROPOFOL;  Surgeon: Lollie Sails, MD;  Location: [facility] ENDOSCOPY;  Service: Endoscopy;  Laterality: N/A;  . Esophagogastroduodenoscopy (egd) with propofol N/A 05/31/2015    Procedure: ESOPHAGOGASTRODUODENOSCOPY (EGD) WITH PROPOFOL;  Surgeon: Lollie Sails, MD;  Location: [facility] ENDOSCOPY;  Service: Endoscopy;  Laterality: N/A;     Current Outpatient Prescriptions  Medication Sig Dispense Refill  . aspirin EC 81 MG tablet Take 81 mg by mouth at bedtime.    Marland Kitchen atorvastatin (LIPITOR) 40 MG tablet Take 1 tablet (40 mg total) by mouth daily at 6 PM. 30 tablet 6  . levofloxacin (LEVAQUIN) 500 MG tablet Take 1 tablet (500 mg total) by mouth daily. Patient sign. should be:  take one tablet,( 500 mg) every 48 hours at bedtime, thank you 7 tablet 0  . metoprolol tartrate (LOPRESSOR) 25 MG tablet Take 0.5 tablets (12.5 mg total) by mouth 2 (two) times daily. 60 tablet 6  . Nutritional Supplements (FEEDING SUPPLEMENT, NEPRO CARB STEADY,) LIQD Take 237 mLs by mouth 2 (two) times daily between meals. 60 Can 6  . sevelamer carbonate (RENVELA) 800 MG tablet Take 3,200 mg by mouth 3 (three) times daily with meals.     No current facility-administered  medications for this visit.    Allergies:   Review of patient's allergies indicates no known allergies.    Social History:  The patient  reports that he has quit smoking. He does not have any smokeless tobacco history on file. He reports that he does not drink alcohol or use illicit drugs.   Family History:  The patient's family history includes Diabetes in his father and mother; Hypertension in his father.    ROS:  Please see the history of present illness.   Otherwise, review of systems are positive for none.   All other systems are reviewed and negative.    PHYSICAL EXAM: VS:  BP 118/64 mmHg  Pulse 85  Ht 5\' 8"  (1.727 m)  Wt 183 lb (83.008 kg)  BMI 27.83 kg/m2 , BMI Body mass index is 27.83 kg/(m^2). GEN: Well nourished, well developed, in no acute distress HEENT: normal Neck: no JVD, carotid bruits, or masses Cardiac: RRR; no murmurs, rubs, or gallops,no edema  Respiratory:  clear to auscultation bilaterally, normal work of breathing GI: soft, nontender, nondistended, + BS MS: no deformity or atrophy Skin: warm and dry, no rash Neuro:  Strength and sensation are intact Psych: euthymic mood, full affect   EKG:  EKG is ordered today. The ekg ordered today demonstrates normal sinus rhythm with PVCs, nonspecific T wave changes.   Recent Labs: 12/28/2014: TSH 0.162* 01/26/2015: Magnesium 2.0 10/20/2015: ALT 41; B Natriuretic Peptide 4369.0* 10/21/2015: BUN 36*; Creatinine, Ser 4.90*; Potassium 4.5; Sodium 141 10/23/2015: Hemoglobin 10.2*; Platelets 124*    Lipid Panel    Component Value Date/Time   CHOL 102 10/21/2015 0805   CHOL 184 05/31/2013 0249   TRIG 88 10/21/2015 0805   TRIG 182 05/31/2013 0249   HDL 29* 10/21/2015 0805   HDL 37* 05/31/2013 0249   CHOLHDL 3.5 10/21/2015 0805   VLDL 18 10/21/2015 0805   VLDL 36 05/31/2013 0249   LDLCALC 55 10/21/2015 0805   LDLCALC 111* 05/31/2013 0249      Wt Readings from Last 3 Encounters:  11/01/15 183 lb (83.008 kg)    10/23/15 183 lb 14.4 oz (83.416 kg)  07/07/15 182 lb (82.555 kg)         ASSESSMENT AND PLAN:  1.  Coronary artery disease with recent non-ST elevation myocardial infarction: His myocardial infarction was in the setting of underlying sepsis with Escherichia coli. Most likely was due to supply demand ischemia but he has known history of coronary artery disease. He is currently not having any anginal symptoms and thus I did not order any ischemic cardiac evaluation at the present time. I don't think stress testing is very helpful in his situation due to his cardiomyopathy and I might consider proceeding with cardiac catheterization once he is fully recovered.  2. Chronic systolic heart failure: Echocardiogram recently showed a drop in his LV systolic function to [id number]. As mentioned above, a right and left cardiac catheterization will be considered. In the meanwhile, continue treatment  with metoprolol. Unfortunately, we cannot add any other heart failure medications due to hypotension.  3. Hyperlipidemia: Continue treatment with atorvastatin with a target LDL of less than 70.       Disposition:   FU with me in 1 month  Signed,  Kathlyn Sacramento, MD  11/01/2015 3:32 PM    [facility]

## 2015-11-01 NOTE — Patient Instructions (Signed)
Medication Instructions: Continue same medications.   Labwork: None.   Procedures/Testing: None.   Follow-Up: 1 month with Dr. Lillyona Polasek.   Any Additional Special Instructions Will Be Listed Below (If Applicable).     If you need a refill on your cardiac medications before your next appointment, please call your pharmacy.   

## 2015-11-02 DIAGNOSIS — R197 Diarrhea, unspecified: Secondary | ICD-10-CM | POA: Diagnosis not present

## 2015-11-02 DIAGNOSIS — D508 Other iron deficiency anemias: Secondary | ICD-10-CM | POA: Diagnosis not present

## 2015-11-02 DIAGNOSIS — E1129 Type 2 diabetes mellitus with other diabetic kidney complication: Secondary | ICD-10-CM | POA: Diagnosis not present

## 2015-11-02 DIAGNOSIS — Z5181 Encounter for therapeutic drug level monitoring: Secondary | ICD-10-CM | POA: Diagnosis not present

## 2015-11-02 DIAGNOSIS — N2581 Secondary hyperparathyroidism of renal origin: Secondary | ICD-10-CM | POA: Diagnosis not present

## 2015-11-02 DIAGNOSIS — R6883 Chills (without fever): Secondary | ICD-10-CM | POA: Diagnosis not present

## 2015-11-02 DIAGNOSIS — N186 End stage renal disease: Secondary | ICD-10-CM | POA: Diagnosis not present

## 2015-11-04 DIAGNOSIS — N186 End stage renal disease: Secondary | ICD-10-CM | POA: Diagnosis not present

## 2015-11-04 DIAGNOSIS — D508 Other iron deficiency anemias: Secondary | ICD-10-CM | POA: Diagnosis not present

## 2015-11-04 DIAGNOSIS — N2581 Secondary hyperparathyroidism of renal origin: Secondary | ICD-10-CM | POA: Diagnosis not present

## 2015-11-04 DIAGNOSIS — R197 Diarrhea, unspecified: Secondary | ICD-10-CM | POA: Diagnosis not present

## 2015-11-04 DIAGNOSIS — R6883 Chills (without fever): Secondary | ICD-10-CM | POA: Diagnosis not present

## 2015-11-04 DIAGNOSIS — Z5181 Encounter for therapeutic drug level monitoring: Secondary | ICD-10-CM | POA: Diagnosis not present

## 2015-11-07 DIAGNOSIS — N2581 Secondary hyperparathyroidism of renal origin: Secondary | ICD-10-CM | POA: Diagnosis not present

## 2015-11-07 DIAGNOSIS — D508 Other iron deficiency anemias: Secondary | ICD-10-CM | POA: Diagnosis not present

## 2015-11-07 DIAGNOSIS — R197 Diarrhea, unspecified: Secondary | ICD-10-CM | POA: Diagnosis not present

## 2015-11-07 DIAGNOSIS — N186 End stage renal disease: Secondary | ICD-10-CM | POA: Diagnosis not present

## 2015-11-07 DIAGNOSIS — Z5181 Encounter for therapeutic drug level monitoring: Secondary | ICD-10-CM | POA: Diagnosis not present

## 2015-11-07 DIAGNOSIS — R6883 Chills (without fever): Secondary | ICD-10-CM | POA: Diagnosis not present

## 2015-11-09 DIAGNOSIS — Z5181 Encounter for therapeutic drug level monitoring: Secondary | ICD-10-CM | POA: Diagnosis not present

## 2015-11-09 DIAGNOSIS — R197 Diarrhea, unspecified: Secondary | ICD-10-CM | POA: Diagnosis not present

## 2015-11-09 DIAGNOSIS — D508 Other iron deficiency anemias: Secondary | ICD-10-CM | POA: Diagnosis not present

## 2015-11-09 DIAGNOSIS — N186 End stage renal disease: Secondary | ICD-10-CM | POA: Diagnosis not present

## 2015-11-09 DIAGNOSIS — R6883 Chills (without fever): Secondary | ICD-10-CM | POA: Diagnosis not present

## 2015-11-09 DIAGNOSIS — N2581 Secondary hyperparathyroidism of renal origin: Secondary | ICD-10-CM | POA: Diagnosis not present

## 2015-11-10 ENCOUNTER — Ambulatory Visit (INDEPENDENT_AMBULATORY_CARE_PROVIDER_SITE_OTHER): Payer: Medicare Other | Admitting: Family Medicine

## 2015-11-10 VITALS — BP 128/66 | HR 82 | Temp 98.4°F | Resp 16 | Ht 68.0 in | Wt 184.6 lb

## 2015-11-10 DIAGNOSIS — Z992 Dependence on renal dialysis: Secondary | ICD-10-CM | POA: Diagnosis not present

## 2015-11-10 DIAGNOSIS — J301 Allergic rhinitis due to pollen: Secondary | ICD-10-CM | POA: Diagnosis not present

## 2015-11-10 DIAGNOSIS — Z09 Encounter for follow-up examination after completed treatment for conditions other than malignant neoplasm: Secondary | ICD-10-CM | POA: Diagnosis not present

## 2015-11-10 DIAGNOSIS — I2510 Atherosclerotic heart disease of native coronary artery without angina pectoris: Secondary | ICD-10-CM | POA: Diagnosis not present

## 2015-11-10 DIAGNOSIS — N186 End stage renal disease: Secondary | ICD-10-CM | POA: Diagnosis not present

## 2015-11-10 DIAGNOSIS — I5022 Chronic systolic (congestive) heart failure: Secondary | ICD-10-CM

## 2015-11-10 DIAGNOSIS — E1122 Type 2 diabetes mellitus with diabetic chronic kidney disease: Secondary | ICD-10-CM

## 2015-11-10 DIAGNOSIS — S61219A Laceration without foreign body of unspecified finger without damage to nail, initial encounter: Secondary | ICD-10-CM

## 2015-11-10 MED ORDER — FLUTICASONE PROPIONATE 50 MCG/ACT NA SUSP: 2.0000 | Freq: Every day | NASAL | Status: AC

## 2015-11-10 MED ORDER — LORATADINE 10 MG PO TABS: ORAL_TABLET | ORAL | Status: DC

## 2015-11-10 NOTE — Assessment & Plan Note (Signed)
Controlled. Recheck DM in May 2017. Sugars were stable in the hospital.

## 2015-11-10 NOTE — Assessment & Plan Note (Signed)
Last EF 25%. Followed by cardiology. Pt has had follow-up with cardiology since his last hospitalization.

## 2015-11-10 NOTE — Progress Notes (Signed)
Subjective:    Patient ID: Luis Walter, male    DOB: [birth year], 65 y.o.   MRN: [id number]  HPI: Luis Walter is a 64 y.o. male presenting on 11/10/2015 for Hospitalization Follow-up   HPI  Pt presents for hospital follow-up following sepsis and possible NSTEMI after viral gastroenteritis. Has completed course of abx. Overall doing well at home. BP's have been running "kind of low" at home. Checked before dialysis.  No chest pain or shortness of breath. Follow with cardiology on CHF- EF dropped to 25% Allergies have been bothering him at home. Has not taken anything over the counter.  Wound on finger- not healing. Unsure when it happened. 3rd finger L hand. Non-tender. No drainage. Just dry and slow to heal.   Past Medical History  Diagnosis Date  . ESRD (end stage renal disease) ([place])     a. on HD M,W,F; b. previously on transplant list at [facility] - removed 2016  . DM2 (diabetes mellitus, type 2) ([place])   . CAD (coronary artery disease)     a. cardiac cath 2014: LM nl, mLAD 30%, dLAD 30%, ostLCx 50%, mid ramus 20% OM3 80% pRCA 20%, mRCA 20%, PDA 100% w/ left to right collats, medically managed  b.10/2015: NSTEMI   . Stroke ([place])   . Chronic systolic CHF (congestive heart failure) ([place])     a. echo 12/2014: EF 35-40%, mod diffuse HK, RWMA cannot be excluded, LV diastolic fxn parameters nl, mild MR, moderately dilated LA, mildly dilated RV internal cavity size, mild TR, PASP nl  . Gallstones   . Dialysis patient [facility])     Current Outpatient Prescriptions on File Prior to Visit  Medication Sig  . aspirin EC 81 MG tablet Take 81 mg by mouth at bedtime.  Marland Kitchen atorvastatin (LIPITOR) 40 MG tablet Take 1 tablet (40 mg total) by mouth daily at 6 PM.  . metoprolol tartrate (LOPRESSOR) 25 MG tablet Take 0.5 tablets (12.5 mg total) by mouth 2 (two) times daily.  . Nutritional Supplements (FEEDING SUPPLEMENT, NEPRO CARB STEADY,) LIQD Take 237 mLs by mouth 2 (two) times daily between meals.  .  sevelamer carbonate (RENVELA) 800 MG tablet Take 3,200 mg by mouth 3 (three) times daily with meals.   No current facility-administered medications on file prior to visit.    Review of Systems  Constitutional: Negative for fever and chills.  HENT: Positive for rhinorrhea.   Eyes: Positive for itching.  Respiratory: Negative for chest tightness, shortness of breath and wheezing.   Cardiovascular: Negative for chest pain, palpitations and leg swelling.  Gastrointestinal: Negative for nausea, vomiting and abdominal pain.  Endocrine: Negative.   Genitourinary: Negative for dysuria, urgency, discharge, penile pain and testicular pain.  Musculoskeletal: Negative for back pain, joint swelling and arthralgias.  Skin: Positive for wound.  Neurological: Negative for dizziness, weakness, numbness and headaches.  Psychiatric/Behavioral: Negative for sleep disturbance and dysphoric mood.   Per HPI unless specifically indicated above     Objective:    BP 128/66 mmHg  Pulse 82  Temp(Src) 98.4 F (36.9 C) (Oral)  Resp 16  Ht 5\' 8"  (1.727 m)  Wt 184 lb 9.6 oz (83.734 kg)  BMI 28.07 kg/m2  Wt Readings from Last 3 Encounters:  11/10/15 184 lb 9.6 oz (83.734 kg)  11/01/15 183 lb (83.008 kg)  10/23/15 183 lb 14.4 oz (83.416 kg)    Physical Exam  Constitutional: He is oriented to person, place, and time. He appears well-developed and well-nourished.  No distress.  HENT:  Head: Normocephalic and atraumatic.  Neck: Neck supple. No thyromegaly present.  Cardiovascular: Normal rate, regular rhythm and normal heart sounds.  Exam reveals no gallop and no friction rub.   No murmur heard. +thrill and bruit on L arm fistula.   Pulmonary/Chest: Effort normal and breath sounds normal. He has no wheezes.  Abdominal: Soft. Bowel sounds are normal. He exhibits no distension. There is no tenderness. There is no rebound.  Musculoskeletal: Normal range of motion. He exhibits no edema or tenderness.    Neurological: He is alert and oriented to person, place, and time. He has normal reflexes.  Skin: Skin is warm and dry. No rash noted. No erythema.  Non-healing wound on L 3rd finger. No erythema.   Psychiatric: He has a normal mood and affect. His behavior is normal. Thought content normal.   Results for orders placed or performed during the hospital encounter of 10/20/15  Culture, blood (routine x 2)  Result Value Ref Range   Specimen Description BLOOD RIGHT WRIST    Special Requests BOTTLES DRAWN AEROBIC AND ANAEROBIC  1CC    Culture  Setup Time      GRAM NEGATIVE RODS AEROBIC BOTTLE ONLY CRITICAL RESULT CALLED TO, READ BACK BY AND VERIFIED WITH: NATE COOKSON ON 10/23/15 AT 0429 BY TLB CONFIRMED BY TLB/RWW    Culture ESCHERICHIA COLI AEROBIC BOTTLE ONLY     Report Status 10/25/2015 FINAL    Organism ID, Bacteria ESCHERICHIA COLI       Susceptibility   Escherichia coli - MIC*    AMPICILLIN >=32 RESISTANT Resistant     CEFAZOLIN <=4 SENSITIVE Sensitive     CEFEPIME <=1 SENSITIVE Sensitive     CEFTAZIDIME <=1 SENSITIVE Sensitive     CEFTRIAXONE <=1 SENSITIVE Sensitive     CIPROFLOXACIN <=0.25 SENSITIVE Sensitive     GENTAMICIN <=1 SENSITIVE Sensitive     IMIPENEM <=0.25 SENSITIVE Sensitive     TRIMETH/SULFA <=20 SENSITIVE Sensitive     AMPICILLIN/SULBACTAM 8 SENSITIVE Sensitive     PIP/TAZO <=4 SENSITIVE Sensitive     Extended ESBL NEGATIVE Sensitive     * ESCHERICHIA COLI  Culture, blood (routine x 2)  Result Value Ref Range   Specimen Description BLOOD RIGHT HAND    Special Requests BOTTLES DRAWN AEROBIC AND ANAEROBIC 1CC    Culture  Setup Time      GRAM POSITIVE COCCI AEROBIC BOTTLE ONLY CRITICAL RESULT CALLED TO, READ BACK BY AND VERIFIED WITH: NATE COOKSON AT 0018 10/22/15.PMH CONFIRMED BY RWW    Culture      COAGULASE NEGATIVE STAPHYLOCOCCUS AEROBIC BOTTLE ONLY Results consistent with contamination.    Report Status 10/25/2015 FINAL   Rapid Influenza A&B  Antigens ([facility] only)  Result Value Ref Range   Influenza A ([facility]) NEGATIVE NEGATIVE   Influenza B ([facility]) NEGATIVE NEGATIVE  C difficile quick scan w PCR reflex  Result Value Ref Range   C Diff antigen NEGATIVE NEGATIVE   C Diff toxin NEGATIVE NEGATIVE   C Diff interpretation Negative for C. difficile   MRSA PCR Screening  Result Value Ref Range   MRSA by PCR NEGATIVE NEGATIVE  Blood Culture ID Panel (Reflexed)  Result Value Ref Range   Enterococcus species NOT DETECTED NOT DETECTED   Vancomycin resistance NOT DETECTED NOT DETECTED   Listeria monocytogenes NOT DETECTED NOT DETECTED   Staphylococcus species DETECTED (A) NOT DETECTED   Staphylococcus aureus NOT DETECTED NOT DETECTED   Methicillin resistance NOT DETECTED NOT  DETECTED   Streptococcus species NOT DETECTED NOT DETECTED   Streptococcus agalactiae NOT DETECTED NOT DETECTED   Streptococcus pneumoniae NOT DETECTED NOT DETECTED   Streptococcus pyogenes NOT DETECTED NOT DETECTED   Acinetobacter baumannii NOT DETECTED NOT DETECTED   Enterobacteriaceae species NOT DETECTED NOT DETECTED   Enterobacter cloacae complex NOT DETECTED NOT DETECTED   Escherichia coli NOT DETECTED NOT DETECTED   Klebsiella oxytoca NOT DETECTED NOT DETECTED   Klebsiella pneumoniae NOT DETECTED NOT DETECTED   Proteus species NOT DETECTED NOT DETECTED   Serratia marcescens NOT DETECTED NOT DETECTED   Carbapenem resistance NOT DETECTED NOT DETECTED   Haemophilus influenzae NOT DETECTED NOT DETECTED   Neisseria meningitidis NOT DETECTED NOT DETECTED   Pseudomonas aeruginosa NOT DETECTED NOT DETECTED   Candida albicans NOT DETECTED NOT DETECTED   Candida glabrata NOT DETECTED NOT DETECTED   Candida krusei NOT DETECTED NOT DETECTED   Candida parapsilosis NOT DETECTED NOT DETECTED   Candida tropicalis NOT DETECTED NOT DETECTED  CULTURE, BLOOD (ROUTINE X 2) w Reflex to PCR ID Panel  Result Value Ref Range   Specimen Description BLOOD RIGHT ANTECUBITAL     Special Requests BOTTLES DRAWN AEROBIC AND ANAEROBIC  [place]    Culture NO GROWTH 5 DAYS    Report Status 10/27/2015 FINAL   CULTURE, BLOOD (ROUTINE X 2) w Reflex to PCR ID Panel  Result Value Ref Range   Specimen Description BLOOD RIGHT HAND    Special Requests BOTTLES DRAWN AEROBIC AND ANAEROBIC  8CC    Culture NO GROWTH 5 DAYS    Report Status 10/27/2015 FINAL   Blood Culture ID Panel (Reflexed)  Result Value Ref Range   Enterococcus species NOT DETECTED NOT DETECTED   Vancomycin resistance NOT DETECTED NOT DETECTED   Listeria monocytogenes NOT DETECTED NOT DETECTED   Staphylococcus species NOT DETECTED NOT DETECTED   Staphylococcus aureus NOT DETECTED NOT DETECTED   Methicillin resistance NOT DETECTED NOT DETECTED   Streptococcus species NOT DETECTED NOT DETECTED   Streptococcus agalactiae NOT DETECTED NOT DETECTED   Streptococcus pneumoniae NOT DETECTED NOT DETECTED   Streptococcus pyogenes NOT DETECTED NOT DETECTED   Acinetobacter baumannii NOT DETECTED NOT DETECTED   Enterobacteriaceae species DETECTED (A) NOT DETECTED   Enterobacter cloacae complex NOT DETECTED NOT DETECTED   Escherichia coli DETECTED (A) NOT DETECTED   Klebsiella oxytoca NOT DETECTED NOT DETECTED   Klebsiella pneumoniae NOT DETECTED NOT DETECTED   Proteus species NOT DETECTED NOT DETECTED   Serratia marcescens NOT DETECTED NOT DETECTED   Carbapenem resistance NOT DETECTED NOT DETECTED   Haemophilus influenzae NOT DETECTED NOT DETECTED   Neisseria meningitidis NOT DETECTED NOT DETECTED   Pseudomonas aeruginosa NOT DETECTED NOT DETECTED   Candida albicans NOT DETECTED NOT DETECTED   Candida glabrata NOT DETECTED NOT DETECTED   Candida krusei NOT DETECTED NOT DETECTED   Candida parapsilosis NOT DETECTED NOT DETECTED   Candida tropicalis NOT DETECTED NOT DETECTED  Lipase, blood  Result Value Ref Range   Lipase 10 (L) 11 - 51 U/L  Comprehensive metabolic panel  Result Value Ref Range   Sodium 141 135 - 145  mmol/L   Potassium 5.6 (H) 3.5 - 5.1 mmol/L   Chloride 97 (L) 101 - 111 mmol/L   CO2 32 22 - 32 mmol/L   Glucose, Bld 134 (H) 65 - 99 mg/dL   BUN 42 (H) 6 - 20 mg/dL   Creatinine, Ser 6.33 (H) 0.61 - 1.24 mg/dL   Calcium 8.6 (L) 8.9 -  10.3 mg/dL   Total Protein 5.8 (L) 6.5 - 8.1 g/dL   Albumin 2.6 (L) 3.5 - 5.0 g/dL   AST 93 (H) 15 - 41 U/L   ALT 41 17 - 63 U/L   Alkaline Phosphatase 134 (H) 38 - 126 U/L   Total Bilirubin 8.0 (H) 0.3 - 1.2 mg/dL   GFR calc non Af Amer 8 (L) >60 mL/min   GFR calc Af Amer 10 (L) >60 mL/min   Anion gap 12 5 - 15  CBC  Result Value Ref Range   WBC 18.3 (H) 3.8 - 10.6 K/uL   RBC 3.84 (L) 4.40 - 5.90 MIL/uL   Hemoglobin 11.4 (L) 13.0 - 18.0 g/dL   HCT 35.1 (L) 40.0 - 52.0 %   MCV 91.5 80.0 - 100.0 fL   MCH 29.7 26.0 - 34.0 pg   MCHC 32.4 32.0 - 36.0 g/dL   RDW 15.7 (H) 11.5 - 14.5 %   Platelets 118 (L) 150 - 440 K/uL  Lactic acid, plasma  Result Value Ref Range   Lactic Acid, Venous 2.3 (HH) 0.5 - 2.0 mmol/L  Lactic acid, plasma  Result Value Ref Range   Lactic Acid, Venous 2.2 (HH) 0.5 - 2.0 mmol/L  Troponin I  Result Value Ref Range   Troponin I 4.56 (H) <0.031 ng/mL  Brain natriuretic peptide  Result Value Ref Range   B Natriuretic Peptide 4369.0 (H) 0.0 - 100.0 pg/mL  APTT  Result Value Ref Range   aPTT 35 24 - 36 seconds  Protime-INR  Result Value Ref Range   Prothrombin Time 17.8 (H) 11.4 - 15.0 seconds   INR 1.46   Heparin level (unfractionated)  Result Value Ref Range   Heparin Unfractionated 0.18 (L) 0.30 - 0.70 IU/mL  Troponin I  Result Value Ref Range   Troponin I 4.73 (H) <0.031 ng/mL  Troponin I (q 6hr x 3)  Result Value Ref Range   Troponin I 3.36 (H) <0.031 ng/mL  Troponin I (q 6hr x 3)  Result Value Ref Range   Troponin I 3.18 (H) <0.031 ng/mL  Glucose, capillary  Result Value Ref Range   Glucose-Capillary 135 (H) 65 - 99 mg/dL   Comment 1 Notify RN   CBC  Result Value Ref Range   WBC 11.6 (H) 3.8 - 10.6 K/uL    RBC 3.47 (L) 4.40 - 5.90 MIL/uL   Hemoglobin 10.4 (L) 13.0 - 18.0 g/dL   HCT 31.7 (L) 40.0 - 52.0 %   MCV 91.3 80.0 - 100.0 fL   MCH 29.8 26.0 - 34.0 pg   MCHC 32.7 32.0 - 36.0 g/dL   RDW 15.6 (H) 11.5 - 14.5 %   Platelets 113 (L) 150 - 440 K/uL  Hepatitis B surface antigen  Result Value Ref Range   Hepatitis B Surface Ag Negative Negative  Heparin level (unfractionated)  Result Value Ref Range   Heparin Unfractionated 0.27 (L) 0.30 - 0.70 IU/mL  Glucose, capillary  Result Value Ref Range   Glucose-Capillary 103 (H) 65 - 99 mg/dL   Comment 1 Notify RN   Glucose, capillary  Result Value Ref Range   Glucose-Capillary 82 65 - 99 mg/dL  Glucose, capillary  Result Value Ref Range   Glucose-Capillary 86 65 - 99 mg/dL   Comment 1 Notify RN   Basic metabolic panel  Result Value Ref Range   Sodium 141 135 - 145 mmol/L   Potassium 4.5 3.5 - 5.1 mmol/L   Chloride 100 (L)  101 - 111 mmol/L   CO2 32 22 - 32 mmol/L   Glucose, Bld 75 65 - 99 mg/dL   BUN 36 (H) 6 - 20 mg/dL   Creatinine, Ser 4.90 (H) 0.61 - 1.24 mg/dL   Calcium 8.4 (L) 8.9 - 10.3 mg/dL   GFR calc non Af Amer 12 (L) >60 mL/min   GFR calc Af Amer 13 (L) >60 mL/min   Anion gap 9 5 - 15  Lipid panel  Result Value Ref Range   Cholesterol 102 0 - 200 mg/dL   Triglycerides 88 <150 mg/dL   HDL 29 (L) >40 mg/dL   Total CHOL/HDL Ratio 3.5 RATIO   VLDL 18 0 - 40 mg/dL   LDL Cholesterol 55 0 - 99 mg/dL  Heparin level (unfractionated)  Result Value Ref Range   Heparin Unfractionated 0.16 (L) 0.30 - 0.70 IU/mL  Glucose, capillary  Result Value Ref Range   Glucose-Capillary 96 65 - 99 mg/dL   Comment 1 Notify RN   Glucose, capillary  Result Value Ref Range   Glucose-Capillary 107 (H) 65 - 99 mg/dL   Comment 1 Notify RN   Heparin level (unfractionated)  Result Value Ref Range   Heparin Unfractionated 0.24 (L) 0.30 - 0.70 IU/mL  CBC with Differential/Platelet  Result Value Ref Range   WBC 7.1 3.8 - 10.6 K/uL   RBC  3.35 (L) 4.40 - 5.90 MIL/uL   Hemoglobin 10.2 (L) 13.0 - 18.0 g/dL   HCT 31.0 (L) 40.0 - 52.0 %   MCV 92.6 80.0 - 100.0 fL   MCH 30.3 26.0 - 34.0 pg   MCHC 32.7 32.0 - 36.0 g/dL   RDW 15.3 (H) 11.5 - 14.5 %   Platelets 112 (L) 150 - 440 K/uL   Neutrophils Relative % 81 %   Neutro Abs 5.7 1.4 - 6.5 K/uL   Lymphocytes Relative 11 %   Lymphs Abs 0.8 (L) 1.0 - 3.6 K/uL   Monocytes Relative 7 %   Monocytes Absolute 0.5 0.2 - 1.0 K/uL   Eosinophils Relative 0 %   Eosinophils Absolute 0.0 0 - 0.7 K/uL   Basophils Relative 1 %   Basophils Absolute 0.1 0 - 0.1 K/uL  Glucose, capillary  Result Value Ref Range   Glucose-Capillary 100 (H) 65 - 99 mg/dL   Comment 1 Notify RN   Heparin level (unfractionated)  Result Value Ref Range   Heparin Unfractionated 2.06 (H) 0.30 - 0.70 IU/mL  Glucose, capillary  Result Value Ref Range   Glucose-Capillary 116 (H) 65 - 99 mg/dL  Glucose, capillary  Result Value Ref Range   Glucose-Capillary 100 (H) 65 - 99 mg/dL  CBC  Result Value Ref Range   WBC 4.6 3.8 - 10.6 K/uL   RBC 3.31 (L) 4.40 - 5.90 MIL/uL   Hemoglobin 10.2 (L) 13.0 - 18.0 g/dL   HCT 30.6 (L) 40.0 - 52.0 %   MCV 92.7 80.0 - 100.0 fL   MCH 30.9 26.0 - 34.0 pg   MCHC 33.3 32.0 - 36.0 g/dL   RDW 15.5 (H) 11.5 - 14.5 %   Platelets 124 (L) 150 - 440 K/uL  Glucose, capillary  Result Value Ref Range   Glucose-Capillary 112 (H) 65 - 99 mg/dL  Glucose, capillary  Result Value Ref Range   Glucose-Capillary 86 65 - 99 mg/dL  Glucose, capillary  Result Value Ref Range   Glucose-Capillary 122 (H) 65 - 99 mg/dL      Assessment & Plan:  Problem List Items Addressed This Visit      Cardiovascular and Mediastinum   Chronic systolic CHF (congestive heart failure) ([facility])    Last EF 25%. Followed by cardiology. Pt has had follow-up with cardiology since his last hospitalization.         Respiratory   Allergic rhinitis    Start flonase to help with symptoms. Every other day claritin for  ESRD dosing. Return if not improving.       Relevant Medications   fluticasone (FLONASE) 50 MCG/ACT nasal spray   loratadine (CLARITIN) 10 MG tablet     Endocrine   DM (diabetes mellitus) type II controlled with renal manifestation ([facility])    Controlled. Recheck DM in May 2017. Sugars were stable in the hospital.         Genitourinary   ESRD (end stage renal disease) ([place])    Managed by nephrology. HD.        Other Visit Diagnoses    Hospital discharge follow-up    -  Primary    Overall doing well. Completed Abx. Has followed with cardiology and nephrology.     Laceration of finger, initial encounter         Healing but slow to heal.Recommend moist healing environment. Add vaseline gauze. Plan to refer to wound care if not improving.        Meds ordered this encounter  Medications  . fluticasone (FLONASE) 50 MCG/ACT nasal spray    Sig: Place 2 sprays into both nostrils daily.    Dispense:  16 g    Refill:  11    Order Specific Question:  Supervising Provider    Answer:  Arlis Porta [phone]  . loratadine (CLARITIN) 10 MG tablet    Sig: Take 1 tablet every 48 hours as needed for allergy symptoms.    Dispense:  30 tablet    Refill:  11    Order Specific Question:  Supervising Provider    Answer:  Arlis Porta [phone]      Follow up plan: Return in about 2 months (around 01/10/2016) for Diabetes check-up. Marland Kitchen

## 2015-11-10 NOTE — Assessment & Plan Note (Signed)
Start flonase to help with symptoms. Every other day claritin for ESRD dosing. Return if not improving.

## 2015-11-10 NOTE — Patient Instructions (Signed)
For your allergies- use flonase once daily (2 sprays each nostril). You can also take Claritin as needed for symptoms every other day.   Please keep your follow-up appt with Cardiology.  We will check in on your diabetes in May.

## 2015-11-10 NOTE — Assessment & Plan Note (Signed)
Managed by nephrology. HD.

## 2015-11-11 DIAGNOSIS — R6883 Chills (without fever): Secondary | ICD-10-CM | POA: Diagnosis not present

## 2015-11-11 DIAGNOSIS — Z5181 Encounter for therapeutic drug level monitoring: Secondary | ICD-10-CM | POA: Diagnosis not present

## 2015-11-11 DIAGNOSIS — D508 Other iron deficiency anemias: Secondary | ICD-10-CM | POA: Diagnosis not present

## 2015-11-11 DIAGNOSIS — N2581 Secondary hyperparathyroidism of renal origin: Secondary | ICD-10-CM | POA: Diagnosis not present

## 2015-11-11 DIAGNOSIS — N186 End stage renal disease: Secondary | ICD-10-CM | POA: Diagnosis not present

## 2015-11-11 DIAGNOSIS — R197 Diarrhea, unspecified: Secondary | ICD-10-CM | POA: Diagnosis not present

## 2015-11-14 DIAGNOSIS — D508 Other iron deficiency anemias: Secondary | ICD-10-CM | POA: Diagnosis not present

## 2015-11-14 DIAGNOSIS — Z5181 Encounter for therapeutic drug level monitoring: Secondary | ICD-10-CM | POA: Diagnosis not present

## 2015-11-14 DIAGNOSIS — N2581 Secondary hyperparathyroidism of renal origin: Secondary | ICD-10-CM | POA: Diagnosis not present

## 2015-11-14 DIAGNOSIS — N186 End stage renal disease: Secondary | ICD-10-CM | POA: Diagnosis not present

## 2015-11-14 DIAGNOSIS — R197 Diarrhea, unspecified: Secondary | ICD-10-CM | POA: Diagnosis not present

## 2015-11-14 DIAGNOSIS — R6883 Chills (without fever): Secondary | ICD-10-CM | POA: Diagnosis not present

## 2015-11-15 ENCOUNTER — Ambulatory Visit: Payer: Medicare Other | Admitting: Physician Assistant

## 2015-11-16 DIAGNOSIS — N2581 Secondary hyperparathyroidism of renal origin: Secondary | ICD-10-CM | POA: Diagnosis not present

## 2015-11-16 DIAGNOSIS — N186 End stage renal disease: Secondary | ICD-10-CM | POA: Diagnosis not present

## 2015-11-16 DIAGNOSIS — Z5181 Encounter for therapeutic drug level monitoring: Secondary | ICD-10-CM | POA: Diagnosis not present

## 2015-11-16 DIAGNOSIS — R197 Diarrhea, unspecified: Secondary | ICD-10-CM | POA: Diagnosis not present

## 2015-11-16 DIAGNOSIS — D508 Other iron deficiency anemias: Secondary | ICD-10-CM | POA: Diagnosis not present

## 2015-11-16 DIAGNOSIS — R6883 Chills (without fever): Secondary | ICD-10-CM | POA: Diagnosis not present

## 2015-11-18 DIAGNOSIS — D508 Other iron deficiency anemias: Secondary | ICD-10-CM | POA: Diagnosis not present

## 2015-11-18 DIAGNOSIS — E1122 Type 2 diabetes mellitus with diabetic chronic kidney disease: Secondary | ICD-10-CM | POA: Diagnosis not present

## 2015-11-18 DIAGNOSIS — R197 Diarrhea, unspecified: Secondary | ICD-10-CM | POA: Diagnosis not present

## 2015-11-18 DIAGNOSIS — R6883 Chills (without fever): Secondary | ICD-10-CM | POA: Diagnosis not present

## 2015-11-18 DIAGNOSIS — N186 End stage renal disease: Secondary | ICD-10-CM | POA: Diagnosis not present

## 2015-11-18 DIAGNOSIS — N2581 Secondary hyperparathyroidism of renal origin: Secondary | ICD-10-CM | POA: Diagnosis not present

## 2015-11-18 DIAGNOSIS — Z992 Dependence on renal dialysis: Secondary | ICD-10-CM | POA: Diagnosis not present

## 2015-11-18 DIAGNOSIS — Z5181 Encounter for therapeutic drug level monitoring: Secondary | ICD-10-CM | POA: Diagnosis not present

## 2015-11-21 DIAGNOSIS — D631 Anemia in chronic kidney disease: Secondary | ICD-10-CM | POA: Diagnosis not present

## 2015-11-21 DIAGNOSIS — N186 End stage renal disease: Secondary | ICD-10-CM | POA: Diagnosis not present

## 2015-11-21 DIAGNOSIS — N2581 Secondary hyperparathyroidism of renal origin: Secondary | ICD-10-CM | POA: Diagnosis not present

## 2015-11-23 DIAGNOSIS — N2581 Secondary hyperparathyroidism of renal origin: Secondary | ICD-10-CM | POA: Diagnosis not present

## 2015-11-23 DIAGNOSIS — D631 Anemia in chronic kidney disease: Secondary | ICD-10-CM | POA: Diagnosis not present

## 2015-11-23 DIAGNOSIS — N186 End stage renal disease: Secondary | ICD-10-CM | POA: Diagnosis not present

## 2015-11-25 DIAGNOSIS — N186 End stage renal disease: Secondary | ICD-10-CM | POA: Diagnosis not present

## 2015-11-25 DIAGNOSIS — D631 Anemia in chronic kidney disease: Secondary | ICD-10-CM | POA: Diagnosis not present

## 2015-11-25 DIAGNOSIS — N2581 Secondary hyperparathyroidism of renal origin: Secondary | ICD-10-CM | POA: Diagnosis not present

## 2015-11-28 DIAGNOSIS — N186 End stage renal disease: Secondary | ICD-10-CM | POA: Diagnosis not present

## 2015-11-28 DIAGNOSIS — N2581 Secondary hyperparathyroidism of renal origin: Secondary | ICD-10-CM | POA: Diagnosis not present

## 2015-11-28 DIAGNOSIS — D631 Anemia in chronic kidney disease: Secondary | ICD-10-CM | POA: Diagnosis not present

## 2015-11-30 DIAGNOSIS — N186 End stage renal disease: Secondary | ICD-10-CM | POA: Diagnosis not present

## 2015-11-30 DIAGNOSIS — N2581 Secondary hyperparathyroidism of renal origin: Secondary | ICD-10-CM | POA: Diagnosis not present

## 2015-11-30 DIAGNOSIS — D631 Anemia in chronic kidney disease: Secondary | ICD-10-CM | POA: Diagnosis not present

## 2015-12-02 DIAGNOSIS — D631 Anemia in chronic kidney disease: Secondary | ICD-10-CM | POA: Diagnosis not present

## 2015-12-02 DIAGNOSIS — N2581 Secondary hyperparathyroidism of renal origin: Secondary | ICD-10-CM | POA: Diagnosis not present

## 2015-12-02 DIAGNOSIS — N186 End stage renal disease: Secondary | ICD-10-CM | POA: Diagnosis not present

## 2015-12-05 DIAGNOSIS — N186 End stage renal disease: Secondary | ICD-10-CM | POA: Diagnosis not present

## 2015-12-05 DIAGNOSIS — N2581 Secondary hyperparathyroidism of renal origin: Secondary | ICD-10-CM | POA: Diagnosis not present

## 2015-12-05 DIAGNOSIS — D631 Anemia in chronic kidney disease: Secondary | ICD-10-CM | POA: Diagnosis not present

## 2015-12-07 DIAGNOSIS — N2581 Secondary hyperparathyroidism of renal origin: Secondary | ICD-10-CM | POA: Diagnosis not present

## 2015-12-07 DIAGNOSIS — D631 Anemia in chronic kidney disease: Secondary | ICD-10-CM | POA: Diagnosis not present

## 2015-12-07 DIAGNOSIS — N186 End stage renal disease: Secondary | ICD-10-CM | POA: Diagnosis not present

## 2015-12-09 DIAGNOSIS — D631 Anemia in chronic kidney disease: Secondary | ICD-10-CM | POA: Diagnosis not present

## 2015-12-09 DIAGNOSIS — N186 End stage renal disease: Secondary | ICD-10-CM | POA: Diagnosis not present

## 2015-12-09 DIAGNOSIS — N2581 Secondary hyperparathyroidism of renal origin: Secondary | ICD-10-CM | POA: Diagnosis not present

## 2015-12-12 DIAGNOSIS — N2581 Secondary hyperparathyroidism of renal origin: Secondary | ICD-10-CM | POA: Diagnosis not present

## 2015-12-12 DIAGNOSIS — D631 Anemia in chronic kidney disease: Secondary | ICD-10-CM | POA: Diagnosis not present

## 2015-12-12 DIAGNOSIS — N186 End stage renal disease: Secondary | ICD-10-CM | POA: Diagnosis not present

## 2015-12-13 ENCOUNTER — Ambulatory Visit (INDEPENDENT_AMBULATORY_CARE_PROVIDER_SITE_OTHER): Payer: Medicare Other | Admitting: Cardiovascular Disease

## 2015-12-13 ENCOUNTER — Encounter (INDEPENDENT_AMBULATORY_CARE_PROVIDER_SITE_OTHER): Payer: Self-pay

## 2015-12-13 ENCOUNTER — Encounter: Payer: Self-pay | Admitting: Cardiovascular Disease

## 2015-12-13 VITALS — BP 132/70 | HR 83 | Ht 67.0 in | Wt 183.2 lb

## 2015-12-13 DIAGNOSIS — I5022 Chronic systolic (congestive) heart failure: Secondary | ICD-10-CM | POA: Diagnosis not present

## 2015-12-13 DIAGNOSIS — I2510 Atherosclerotic heart disease of native coronary artery without angina pectoris: Secondary | ICD-10-CM | POA: Diagnosis not present

## 2015-12-13 NOTE — Patient Instructions (Addendum)
Medication Instructions:  Your physician recommends that you continue on your current medications as directed. Please refer to the Current Medication list given to you today.   Labwork: none  Testing/Procedures: Your physician has requested that you have an echocardiogram in 3 months. Echocardiography is a painless test that uses sound waves to create images of your heart. It provides your doctor with information about the size and shape of your heart and how well your heart's chambers and valves are working. This procedure takes approximately one hour. There are no restrictions for this procedure.    Follow-Up: Your physician recommends that you schedule a follow-up appointment in: 3 months with Dr. Fletcher Anon.    Any Other Special Instructions Will Be Listed Below (If Applicable).     If you need a refill on your cardiac medications before your next appointment, please call your pharmacy.

## 2015-12-13 NOTE — Progress Notes (Signed)
Cardiology Office Note   Date:  12/13/2015   ID:  Luis Walter, DOB [birth year], MRN [id number]  PCP:  [name], NP  Cardiologist:   Kathlyn Sacramento, MD   Chief Complaint  Patient presents with  . other    1 month f/u c/o weight gain. Meds reviewed verbally with pt.      History of Present Illness: Luis Walter is a 65 y.o. male who presents for A follow-up visit regarding coronary artery disease and chronic systolic heart failure.  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  He had multiple hospitalizations over the last year:  He was hospitalized in May 2016 with sepsis, GNR bacteremia, and new onset anemia. Another hospitalization in June for diarrhea.  Previous echocardiogram in May 2016 showed an ejection fraction of 35-40% with global hypokinesis, mild mitral regurgitation and mild tricuspid regurgitation with normal pulmonary pressure. He was started on hydralazine and isosorbide but these were discontinued due to hypotension especially given dialysis. He was hospitalized in March 2017 with sepsis due to Escherichia coli. He was found to have mildly elevated troponin which peaked at 4. He was confused and did not have any chest pain. He had a repeat echocardiogram which showed an ejection fraction of 25-30% with global hypokinesis. The patient was treated successfully with antibiotics with improvement.  He has been doing very well since then with no chest pain or shortness of breath. He is not on heart failure medications except small dose metoprolol due to chronic hypotension. His blood pressure continues to drop a little bit during dialysis.  Past Medical History  Diagnosis Date  . ESRD (end stage renal disease) ([place])     a. on HD M,W,F; b. previously on transplant list at [facility] - removed  2016  . DM2 (diabetes mellitus, type 2) ([place])   . CAD (coronary artery disease)     a. cardiac cath 2014: LM nl, mLAD 30%, dLAD 30%, ostLCx 50%, mid ramus 20% OM3 80% pRCA 20%, mRCA 20%, PDA 100% w/ left to right collats, medically managed  b.10/2015: NSTEMI   . Stroke ([place])   . Chronic systolic CHF (congestive heart failure) ([place])     a. echo 12/2014: EF 35-40%, mod diffuse HK, RWMA cannot be excluded, LV diastolic fxn parameters nl, mild MR, moderately dilated LA, mildly dilated RV internal cavity size, mild TR, PASP nl  . Gallstones   . Dialysis patient [facility])     Past Surgical History  Procedure Laterality Date  . Av fistula placement Left   . Colonoscopy with propofol N/A 05/31/2015    Procedure: COLONOSCOPY WITH PROPOFOL;  Surgeon: Lollie Sails, MD;  Location: [facility] ENDOSCOPY;  Service: Endoscopy;  Laterality: N/A;  . Esophagogastroduodenoscopy (egd) with propofol N/A 05/31/2015    Procedure: ESOPHAGOGASTRODUODENOSCOPY (EGD) WITH PROPOFOL;  Surgeon: Lollie Sails, MD;  Location: [facility] ENDOSCOPY;  Service: Endoscopy;  Laterality: N/A;     Current Outpatient Prescriptions  Medication Sig Dispense Refill  . aspirin EC 81 MG tablet Take 81 mg by mouth at bedtime.    Marland Kitchen atorvastatin (LIPITOR) 40 MG tablet Take 1 tablet (40 mg total) by mouth daily at 6 PM. 30 tablet 6  . fluticasone (FLONASE) 50 MCG/ACT nasal spray Place 2 sprays into both nostrils daily. 16 g 11  .  loratadine (CLARITIN) 10 MG tablet Take 1 tablet every 48 hours as needed for allergy symptoms. 30 tablet 11  . metoprolol tartrate (LOPRESSOR) 25 MG tablet Take 0.5 tablets (12.5 mg total) by mouth 2 (two) times daily. 60 tablet 6  . Nutritional Supplements (FEEDING SUPPLEMENT, NEPRO CARB STEADY,) LIQD Take 237 mLs by mouth 2 (two) times daily between meals. 60 Can 6  . sevelamer carbonate (RENVELA) 800 MG tablet Take 3,200 mg by mouth 3 (three) times daily with meals.     No current facility-administered medications  for this visit.    Allergies:   Review of patient's allergies indicates no known allergies.    Social History:  The patient  reports that he has quit smoking. He does not have any smokeless tobacco history on file. He reports that he does not drink alcohol or use illicit drugs.   Family History:  The patient's family history includes Diabetes in his father and mother; Hypertension in his father.    ROS:  Please see the history of present illness.   Otherwise, review of systems are positive for none.   All other systems are reviewed and negative.    PHYSICAL EXAM: VS:  BP 132/70 mmHg  Pulse 83  Ht 5\' 7"  (1.702 m)  Wt 183 lb 4 oz (83.122 kg)  BMI 28.69 kg/m2 , BMI Body mass index is 28.69 kg/(m^2). GEN: Well nourished, well developed, in no acute distress HEENT: normal Neck: no JVD, carotid bruits, or masses Cardiac: RRR; no murmurs, rubs, or gallops,no edema  Respiratory:  clear to auscultation bilaterally, normal work of breathing GI: soft, nontender, nondistended, + BS MS: no deformity or atrophy Skin: warm and dry, no rash Neuro:  Strength and sensation are intact Psych: euthymic mood, full affect   EKG:  EKG is not ordered today.    Recent Labs: 12/28/2014: TSH 0.162* 01/26/2015: Magnesium 2.0 10/20/2015: ALT 41; B Natriuretic Peptide 4369.0* 10/21/2015: BUN 36*; Creatinine, Ser 4.90*; Potassium 4.5; Sodium 141 10/23/2015: Hemoglobin 10.2*; Platelets 124*    Lipid Panel    Component Value Date/Time   CHOL 102 10/21/2015 0805   CHOL 184 05/31/2013 0249   TRIG 88 10/21/2015 0805   TRIG 182 05/31/2013 0249   HDL 29* 10/21/2015 0805   HDL 37* 05/31/2013 0249   CHOLHDL 3.5 10/21/2015 0805   VLDL 18 10/21/2015 0805   VLDL 36 05/31/2013 0249   LDLCALC 55 10/21/2015 0805   LDLCALC 111* 05/31/2013 0249      Wt Readings from Last 3 Encounters:  12/13/15 183 lb 4 oz (83.122 kg)  11/10/15 184 lb 9.6 oz (83.734 kg)  11/01/15 183 lb (83.008 kg)         ASSESSMENT  AND PLAN:  1.  Coronary artery disease . The patient had non-ST elevation myocardial infarction in March in the setting of sepsis with a peak troponin of 4. Since then, he has not had any symptoms suggestive of angina.  Thus, I recommend continuing medical therapy for now.  2. Chronic systolic heart failure: Echocardiogram recently showed a drop in his LV systolic function to [id number] from a previous of 35-40%.  This was in the setting of sepsis which likely led to myocardial stunning. Due to improvement in his symptoms, I'm going to continue medical therapy for now and repeat his echocardiogram in 3 months. Continue small dose metoprolol.  3. Hyperlipidemia: Continue treatment with atorvastatin with a target LDL of less than 70. Most recent LDL was 55.  Disposition:   FU with me in 3 months  Signed,  Kathlyn Sacramento, MD  12/13/2015 3:28 PM    [place]

## 2015-12-14 DIAGNOSIS — N2581 Secondary hyperparathyroidism of renal origin: Secondary | ICD-10-CM | POA: Diagnosis not present

## 2015-12-14 DIAGNOSIS — D631 Anemia in chronic kidney disease: Secondary | ICD-10-CM | POA: Diagnosis not present

## 2015-12-14 DIAGNOSIS — N186 End stage renal disease: Secondary | ICD-10-CM | POA: Diagnosis not present

## 2015-12-16 DIAGNOSIS — N186 End stage renal disease: Secondary | ICD-10-CM | POA: Diagnosis not present

## 2015-12-16 DIAGNOSIS — N2581 Secondary hyperparathyroidism of renal origin: Secondary | ICD-10-CM | POA: Diagnosis not present

## 2015-12-16 DIAGNOSIS — D631 Anemia in chronic kidney disease: Secondary | ICD-10-CM | POA: Diagnosis not present

## 2015-12-18 DIAGNOSIS — Z992 Dependence on renal dialysis: Secondary | ICD-10-CM | POA: Diagnosis not present

## 2015-12-18 DIAGNOSIS — E1122 Type 2 diabetes mellitus with diabetic chronic kidney disease: Secondary | ICD-10-CM | POA: Diagnosis not present

## 2015-12-18 DIAGNOSIS — N186 End stage renal disease: Secondary | ICD-10-CM | POA: Diagnosis not present

## 2015-12-19 DIAGNOSIS — N2581 Secondary hyperparathyroidism of renal origin: Secondary | ICD-10-CM | POA: Diagnosis not present

## 2015-12-19 DIAGNOSIS — N186 End stage renal disease: Secondary | ICD-10-CM | POA: Diagnosis not present

## 2015-12-21 DIAGNOSIS — N186 End stage renal disease: Secondary | ICD-10-CM | POA: Diagnosis not present

## 2015-12-21 DIAGNOSIS — N2581 Secondary hyperparathyroidism of renal origin: Secondary | ICD-10-CM | POA: Diagnosis not present

## 2015-12-23 DIAGNOSIS — N186 End stage renal disease: Secondary | ICD-10-CM | POA: Diagnosis not present

## 2015-12-23 DIAGNOSIS — N2581 Secondary hyperparathyroidism of renal origin: Secondary | ICD-10-CM | POA: Diagnosis not present

## 2015-12-26 DIAGNOSIS — N2581 Secondary hyperparathyroidism of renal origin: Secondary | ICD-10-CM | POA: Diagnosis not present

## 2015-12-26 DIAGNOSIS — N186 End stage renal disease: Secondary | ICD-10-CM | POA: Diagnosis not present

## 2015-12-28 DIAGNOSIS — N2581 Secondary hyperparathyroidism of renal origin: Secondary | ICD-10-CM | POA: Diagnosis not present

## 2015-12-28 DIAGNOSIS — N186 End stage renal disease: Secondary | ICD-10-CM | POA: Diagnosis not present

## 2015-12-30 DIAGNOSIS — N186 End stage renal disease: Secondary | ICD-10-CM | POA: Diagnosis not present

## 2015-12-30 DIAGNOSIS — N2581 Secondary hyperparathyroidism of renal origin: Secondary | ICD-10-CM | POA: Diagnosis not present

## 2016-01-02 DIAGNOSIS — N2581 Secondary hyperparathyroidism of renal origin: Secondary | ICD-10-CM | POA: Diagnosis not present

## 2016-01-02 DIAGNOSIS — N186 End stage renal disease: Secondary | ICD-10-CM | POA: Diagnosis not present

## 2016-01-04 DIAGNOSIS — N2581 Secondary hyperparathyroidism of renal origin: Secondary | ICD-10-CM | POA: Diagnosis not present

## 2016-01-04 DIAGNOSIS — N186 End stage renal disease: Secondary | ICD-10-CM | POA: Diagnosis not present

## 2016-01-04 IMAGING — CR DG CHEST 1V PORT
1 series · 1 of 1 positions shown · non-contrast
Comparison: 05/30/2013

CLINICAL DATA: Two normal 80s. Fever. Nausea vomiting for 1 week.
Code sepsis.

EXAM:
PORTABLE CHEST - 1 VIEW

[ap]
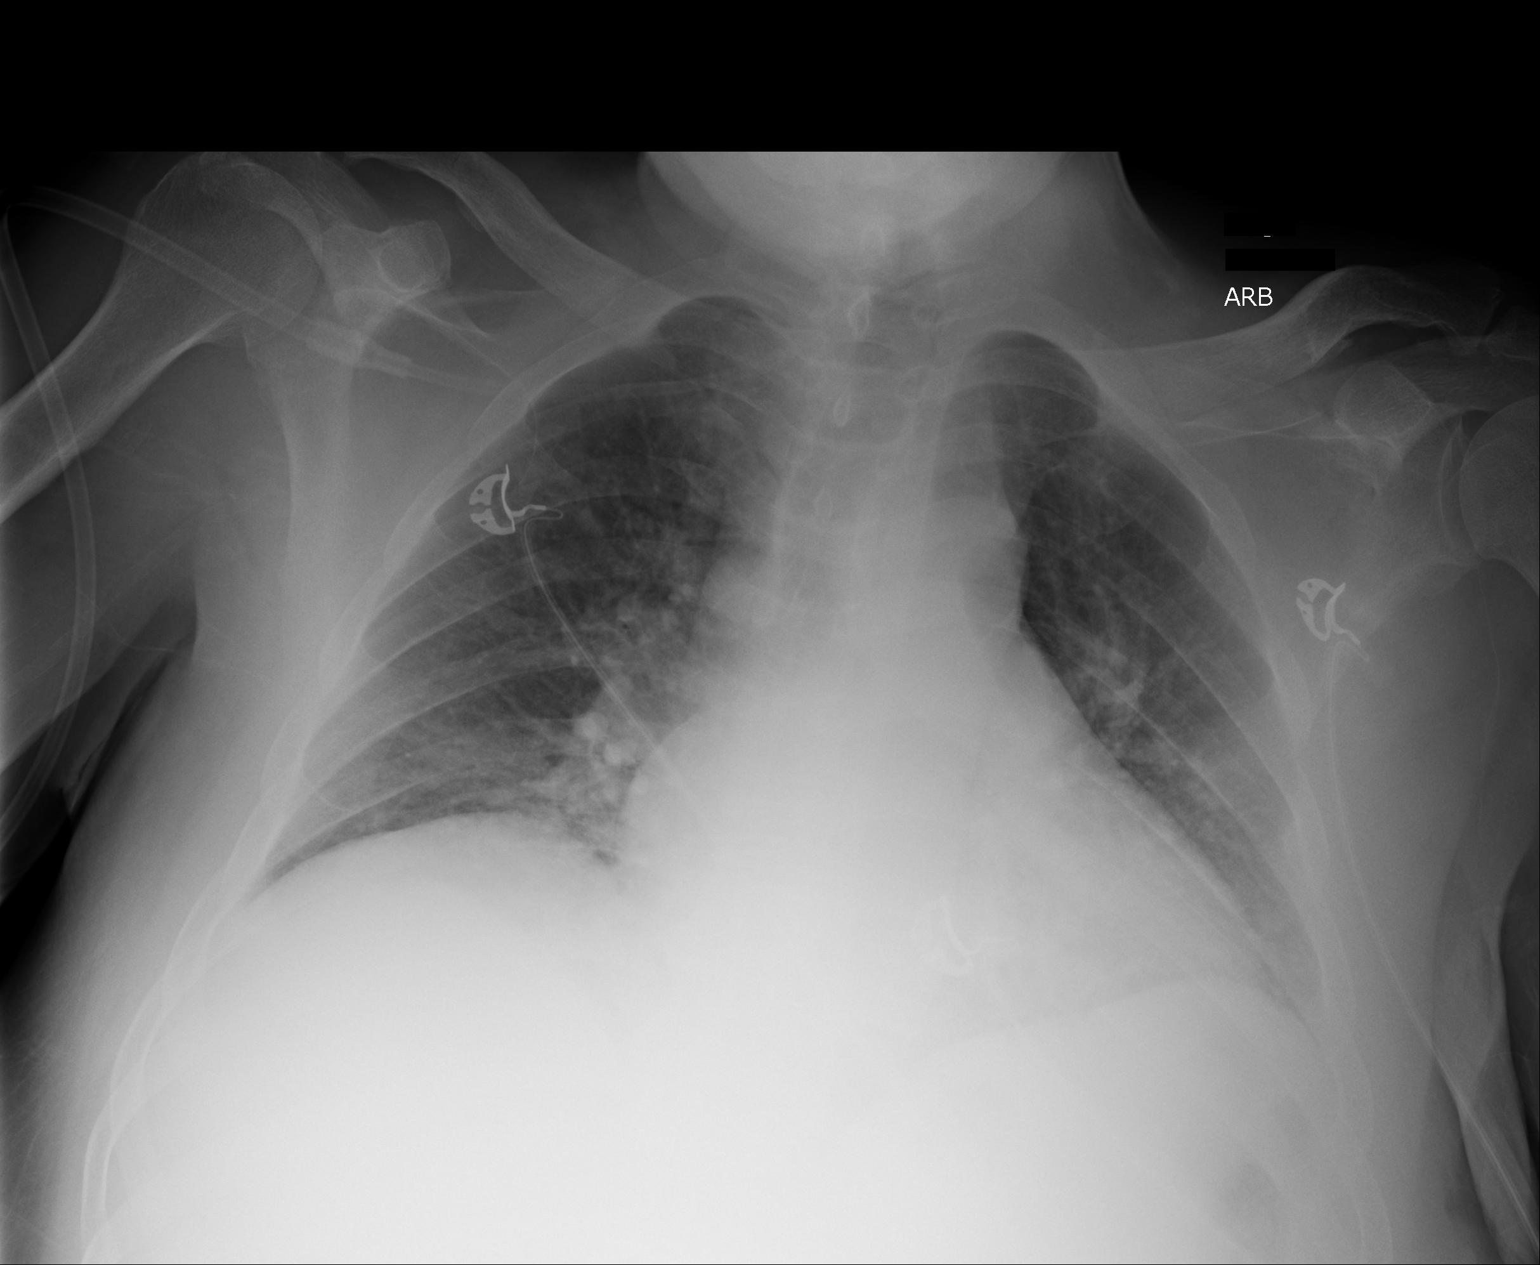

[1 of 1 positions shown; findings below may reference images not displayed]

FINDINGS: Lung volumes are relatively low. There is hazy opacity at the right
lung base which is likely atelectasis. Pneumonia is possible. Lungs
otherwise clear. No pleural effusion or pneumothorax.

Cardiac silhouette normal in size and configuration. No mediastinal
or hilar masses.

Bony thorax is demineralized but grossly intact.
IMPRESSION: 1. Area of hazy opacity at the right lung base, most likely
atelectasis. Pneumonia is possible.
2. No other evidence of acute cardiopulmonary disease.

## 2016-01-06 DIAGNOSIS — N2581 Secondary hyperparathyroidism of renal origin: Secondary | ICD-10-CM | POA: Diagnosis not present

## 2016-01-06 DIAGNOSIS — N186 End stage renal disease: Secondary | ICD-10-CM | POA: Diagnosis not present

## 2016-01-06 IMAGING — NM NM HEPATOBILIARY IMAGE, INC GB
2 series · 8 of 8 positions shown · non-contrast
Comparison: Ultrasound December 25, 2014.

CLINICAL DATA: Elevated bilirubin.

EXAM:
NUCLEAR MEDICINE HEPATOBILIARY IMAGING
TECHNIQUE: Sequential images of the abdomen were obtained [DATE] minutes
following intravenous administration of radiopharmaceutical.
RADIOPHARMACEUTICALS:  7.18 mCi Wc-DDm Choletec IV

[Series 1000: 90 min hepato statics · 2.40mm/px · 2 of 2 slices shown]
[im 1/2]
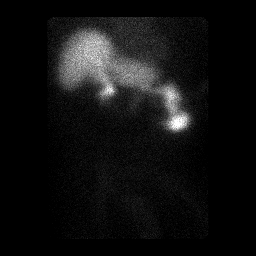
[im 2/2]
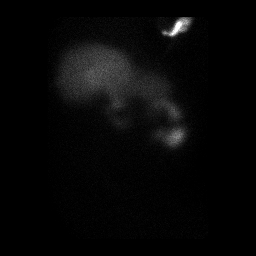

[Series 1000: hepatobiliary scan · 9.59mm/px · 6 of 60 frames shown]
[frame 6/60]
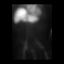
[frame 16/60]
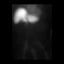
[frame 26/60]
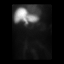
[frame 36/60]
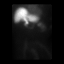
[frame 46/60]
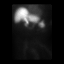
[frame 56/60]
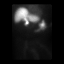

[8 of 8 positions shown; findings below may reference images not displayed]

FINDINGS: Uptake within hepatic parenchyma is noted. Filling of small bowel is
noted. However, no uptake of filling of gallbladder is seen 90
minutes after radiotracer administration. This is concerning for
cystic duct obstruction and possible cholecystitis.
IMPRESSION: No uptake within gallbladder is seen up to 90 minutes after
radiotracer administration, concerning for cystic duct obstruction
and possible cholecystitis.

## 2016-01-09 DIAGNOSIS — N2581 Secondary hyperparathyroidism of renal origin: Secondary | ICD-10-CM | POA: Diagnosis not present

## 2016-01-09 DIAGNOSIS — N186 End stage renal disease: Secondary | ICD-10-CM | POA: Diagnosis not present

## 2016-01-10 ENCOUNTER — Ambulatory Visit (INDEPENDENT_AMBULATORY_CARE_PROVIDER_SITE_OTHER): Payer: Medicare Other | Admitting: Family Medicine

## 2016-01-10 ENCOUNTER — Encounter: Payer: Self-pay | Admitting: Family Medicine

## 2016-01-10 VITALS — BP 121/64 | HR 72 | Temp 97.7°F | Resp 16 | Ht 67.0 in | Wt 180.4 lb

## 2016-01-10 DIAGNOSIS — L739 Follicular disorder, unspecified: Secondary | ICD-10-CM

## 2016-01-10 DIAGNOSIS — Z992 Dependence on renal dialysis: Secondary | ICD-10-CM | POA: Diagnosis not present

## 2016-01-10 DIAGNOSIS — I2510 Atherosclerotic heart disease of native coronary artery without angina pectoris: Secondary | ICD-10-CM | POA: Diagnosis not present

## 2016-01-10 DIAGNOSIS — N186 End stage renal disease: Secondary | ICD-10-CM

## 2016-01-10 DIAGNOSIS — E1122 Type 2 diabetes mellitus with diabetic chronic kidney disease: Secondary | ICD-10-CM

## 2016-01-10 LAB — POCT GLYCOSYLATED HEMOGLOBIN (HGB A1C): HEMOGLOBIN A1C: 6

## 2016-01-10 MED ORDER — CLINDAMYCIN PHOSPHATE 1 % EX LOTN: TOPICAL_LOTION | Freq: Two times a day (BID) | CUTANEOUS | Status: DC

## 2016-01-10 MED ORDER — DOXYCYCLINE HYCLATE 100 MG PO TABS: 100.0000 mg | ORAL_TABLET | Freq: Two times a day (BID) | ORAL | Status: DC

## 2016-01-10 NOTE — Patient Instructions (Signed)
Diabetes: Try changing to Ensure Light to reduce the sugar in diet. That should help the A!c go back down.  We will recheck in 3 mos.   Folliculitis: take doxycycline twice daily for 1 week and apply Clindamycin lotion twice daily for 2 weeks. Then use the lotion as needed.

## 2016-01-10 NOTE — Assessment & Plan Note (Addendum)
Increasing A1c likely due to Ensure- 33g of carb per serving. Encouraged pt to switch to ensure light to decrease average blood sugars. No medication at this time. Recheck A1c in 3 mos.  Eye exam: Due August Foot exam: UTD, due august. No micro albumin due to ESRD.

## 2016-01-10 NOTE — Progress Notes (Signed)
Subjective:    Patient ID: Luis Walter, male    DOB: Jun 14, 1951, 65 y.o.   MRN: [id number]  HPI: Luis Walter is a 65 y.o. male presenting on 01/10/2016 for Diabetes   HPI  Pt present for diabetes follow-up. On HD MWF for ESRD. Gets his sugars checked at dialysis. No current medications for diabetes. A1c is elevated to 6.0.  They want him to do more protein- using nutritional shakes. Does Nepro at dialysis. Using Ensure at home 4 days per week. 2-3 days per day.  Pt has some bumps on shoulder and side- taking a tub bath everynight- Bumps are itchy. Sometimes drain pus- look like zits  Past Medical History  Diagnosis Date  . ESRD (end stage renal disease) ([place])     a. on HD M,W,F; b. previously on transplant list at [facility] - removed 2016  . DM2 (diabetes mellitus, type 2) ([place])   . CAD (coronary artery disease)     a. cardiac cath 2014: LM nl, mLAD 30%, dLAD 30%, ostLCx 50%, mid ramus 20% OM3 80% pRCA 20%, mRCA 20%, PDA 100% w/ left to right collats, medically managed  b.10/2015: NSTEMI   . Stroke ([place])   . Chronic systolic CHF (congestive heart failure) ([place])     a. echo 12/2014: EF 35-40%, mod diffuse HK, RWMA cannot be excluded, LV diastolic fxn parameters nl, mild MR, moderately dilated LA, mildly dilated RV internal cavity size, mild TR, PASP nl  . Gallstones   . Dialysis patient [facility])     Current Outpatient Prescriptions on File Prior to Visit  Medication Sig  . aspirin EC 81 MG tablet Take 81 mg by mouth at bedtime.  Marland Kitchen atorvastatin (LIPITOR) 40 MG tablet Take 1 tablet (40 mg total) by mouth daily at 6 PM.  . fluticasone (FLONASE) 50 MCG/ACT nasal spray Place 2 sprays into both nostrils daily.  Marland Kitchen loratadine (CLARITIN) 10 MG tablet Take 1 tablet every 48 hours as needed for allergy symptoms.  . metoprolol tartrate (LOPRESSOR) 25 MG tablet Take 0.5 tablets (12.5 mg total) by mouth 2 (two) times daily.  . Nutritional Supplements (FEEDING SUPPLEMENT, NEPRO CARB STEADY,) LIQD  Take 237 mLs by mouth 2 (two) times daily between meals.  . sevelamer carbonate (RENVELA) 800 MG tablet Take 3,200 mg by mouth 3 (three) times daily with meals.   No current facility-administered medications on file prior to visit.    Review of Systems  Constitutional: Negative for fever and chills.  HENT: Negative.   Respiratory: Negative for chest tightness, shortness of breath and wheezing.   Cardiovascular: Negative for chest pain, palpitations and leg swelling.  Gastrointestinal: Negative for nausea, vomiting and abdominal pain.  Endocrine: Negative.   Genitourinary: Negative for dysuria, urgency, discharge, penile pain and testicular pain.  Musculoskeletal: Negative for back pain, joint swelling and arthralgias.  Skin: Positive for rash.  Neurological: Negative for dizziness, weakness, numbness and headaches.  Psychiatric/Behavioral: Negative for sleep disturbance and dysphoric mood.   Per HPI unless specifically indicated above     Objective:    BP 121/64 mmHg  Pulse 72  Temp(Src) 97.7 F (36.5 C) (Oral)  Resp 16  Ht 5\' 7"  (1.702 m)  Wt 180 lb 6.4 oz (81.829 kg)  BMI 28.25 kg/m2  Wt Readings from Last 3 Encounters:  01/10/16 180 lb 6.4 oz (81.829 kg)  12/13/15 183 lb 4 oz (83.122 kg)  11/10/15 184 lb 9.6 oz (83.734 kg)    Physical Exam  Constitutional:  He is oriented to person, place, and time. He appears well-developed and well-nourished. No distress.  HENT:  Head: Normocephalic and atraumatic.  Neck: Neck supple. No thyromegaly present.  Cardiovascular: Normal rate, regular rhythm and normal heart sounds.  Exam reveals no gallop and no friction rub.   No murmur heard. L arm AV fistula with +bruit and thrill.   Pulmonary/Chest: Effort normal and breath sounds normal. He has no wheezes.  Abdominal: Soft. Bowel sounds are normal. He exhibits no distension. There is no tenderness. There is no rebound.  Musculoskeletal: Normal range of motion. He exhibits no edema  or tenderness.  Neurological: He is alert and oriented to person, place, and time. He has normal reflexes.  Skin: Skin is warm and dry. Rash noted. Rash is papular and pustular. No erythema.     Psychiatric: He has a normal mood and affect. His behavior is normal. Thought content normal.   Results for orders placed or performed in visit on 01/10/16  POCT HgB A1C  Result Value Ref Range   Hemoglobin A1C 6.0       Assessment & Plan:   Problem List Items Addressed This Visit      Endocrine   Type 2 diabetes, controlled, with renal manifestation ([place]) - Primary    Increasing A1c likely due to Ensure- 33g of carb per serving. Encouraged pt to switch to ensure light to decrease average blood sugars. No medication at this time. Recheck A1c in 3 mos.  Eye exam: Due August Foot exam: UTD, due august. No micro albumin due to ESRD.       Relevant Orders   POCT HgB A1C (Completed)    Other Visit Diagnoses    Folliculitis        Doxy BID x 7 days- no renal adjustment necessary. Clindamycin lotion BID x 2 weeks. Return if not improving.     Relevant Medications    doxycycline (VIBRA-TABS) 100 MG tablet    clindamycin (CLEOCIN T) 1 % lotion       Meds ordered this encounter  Medications  . doxycycline (VIBRA-TABS) 100 MG tablet    Sig: Take 1 tablet (100 mg total) by mouth 2 (two) times daily.    Dispense:  14 tablet    Refill:  0    Order Specific Question:  Supervising Provider    Answer:  Arlis Porta [phone]  . clindamycin (CLEOCIN T) 1 % lotion    Sig: Apply topically 2 (two) times daily.    Dispense:  60 mL    Refill:  2    Order Specific Question:  Supervising Provider    Answer:  Arlis Porta [id number]      Follow up plan: Return in about 3 months (around 04/11/2016) for Diabetes. Marland Kitchen

## 2016-01-11 DIAGNOSIS — N2581 Secondary hyperparathyroidism of renal origin: Secondary | ICD-10-CM | POA: Diagnosis not present

## 2016-01-11 DIAGNOSIS — N186 End stage renal disease: Secondary | ICD-10-CM | POA: Diagnosis not present

## 2016-01-13 DIAGNOSIS — N186 End stage renal disease: Secondary | ICD-10-CM | POA: Diagnosis not present

## 2016-01-13 DIAGNOSIS — N2581 Secondary hyperparathyroidism of renal origin: Secondary | ICD-10-CM | POA: Diagnosis not present

## 2016-01-16 DIAGNOSIS — N2581 Secondary hyperparathyroidism of renal origin: Secondary | ICD-10-CM | POA: Diagnosis not present

## 2016-01-16 DIAGNOSIS — N186 End stage renal disease: Secondary | ICD-10-CM | POA: Diagnosis not present

## 2016-01-18 DIAGNOSIS — N186 End stage renal disease: Secondary | ICD-10-CM | POA: Diagnosis not present

## 2016-01-18 DIAGNOSIS — N2581 Secondary hyperparathyroidism of renal origin: Secondary | ICD-10-CM | POA: Diagnosis not present

## 2016-01-18 DIAGNOSIS — E1122 Type 2 diabetes mellitus with diabetic chronic kidney disease: Secondary | ICD-10-CM | POA: Diagnosis not present

## 2016-01-18 DIAGNOSIS — Z992 Dependence on renal dialysis: Secondary | ICD-10-CM | POA: Diagnosis not present

## 2016-01-20 ENCOUNTER — Emergency Department
Admission: EM | Admit: 2016-01-20 | Discharge: 2016-01-21 | Disposition: A | Discharge status: Home / Self Care | Payer: Medicare Other | Attending: Emergency Medicine | Admitting: Emergency Medicine

## 2016-01-20 DIAGNOSIS — D631 Anemia in chronic kidney disease: Secondary | ICD-10-CM | POA: Diagnosis not present

## 2016-01-20 DIAGNOSIS — Z8673 Personal history of transient ischemic attack (TIA), and cerebral infarction without residual deficits: Secondary | ICD-10-CM | POA: Insufficient documentation

## 2016-01-20 DIAGNOSIS — I2510 Atherosclerotic heart disease of native coronary artery without angina pectoris: Secondary | ICD-10-CM | POA: Diagnosis not present

## 2016-01-20 DIAGNOSIS — Z7982 Long term (current) use of aspirin: Secondary | ICD-10-CM | POA: Insufficient documentation

## 2016-01-20 DIAGNOSIS — Z87891 Personal history of nicotine dependence: Secondary | ICD-10-CM | POA: Diagnosis not present

## 2016-01-20 DIAGNOSIS — Z992 Dependence on renal dialysis: Secondary | ICD-10-CM | POA: Insufficient documentation

## 2016-01-20 DIAGNOSIS — T82838A Hemorrhage of vascular prosthetic devices, implants and grafts, initial encounter: Secondary | ICD-10-CM | POA: Diagnosis not present

## 2016-01-20 DIAGNOSIS — I132 Hypertensive heart and chronic kidney disease with heart failure and with stage 5 chronic kidney disease, or end stage renal disease: Secondary | ICD-10-CM | POA: Insufficient documentation

## 2016-01-20 DIAGNOSIS — I5022 Chronic systolic (congestive) heart failure: Secondary | ICD-10-CM | POA: Insufficient documentation

## 2016-01-20 DIAGNOSIS — Z452 Encounter for adjustment and management of vascular access device: Secondary | ICD-10-CM | POA: Diagnosis present

## 2016-01-20 DIAGNOSIS — E1129 Type 2 diabetes mellitus with other diabetic kidney complication: Secondary | ICD-10-CM | POA: Diagnosis not present

## 2016-01-20 DIAGNOSIS — N186 End stage renal disease: Secondary | ICD-10-CM | POA: Insufficient documentation

## 2016-01-20 DIAGNOSIS — Y69 Unspecified misadventure during surgical and medical care: Secondary | ICD-10-CM | POA: Diagnosis not present

## 2016-01-20 DIAGNOSIS — Z79899 Other long term (current) drug therapy: Secondary | ICD-10-CM | POA: Insufficient documentation

## 2016-01-20 DIAGNOSIS — E1122 Type 2 diabetes mellitus with diabetic chronic kidney disease: Secondary | ICD-10-CM | POA: Diagnosis not present

## 2016-01-20 DIAGNOSIS — R58 Hemorrhage, not elsewhere classified: Secondary | ICD-10-CM

## 2016-01-20 DIAGNOSIS — N2581 Secondary hyperparathyroidism of renal origin: Secondary | ICD-10-CM | POA: Diagnosis not present

## 2016-01-20 NOTE — ED Notes (Signed)
Pt states he had dialysis this morning and usually is able to take off dressing at supper time around 6pm.  Pt states that he had a different person do his dialysis today.  Pt having bleeding to L access area.  Area covered with gauze and tape to hold pressure.  Pt alert and oriented, in no acute distress.

## 2016-01-20 NOTE — ED Notes (Signed)
Dressing of proximal site of L FA AV fistula 75% saturated with sanguineous drainage.  Dressing removed and active oozing present from site.  Kerlix roll and silk tape pressure dressing placed.  L radial pulse palpable and hand warm.  Pt denies N/T.  Instructed to notify RN if any changes in temperature, sensation or if there is breakthrough bleeding.  Pt verbalizes understanding.

## 2016-01-21 NOTE — Discharge Instructions (Signed)
SEEK MEDICAL CARE IF:   Chills develop.  Ongoing or concerning bleeding Weakness or numbness or pain in the left arm or hand You have an oral temperature above 102 F (38.9 C).  Swelling around the graft or fistula gets worse.  New pain develops.  Pus or other fluid (drainage) is seen at the vascular access site.  Skin redness or red streaking is seen on the skin around, above, or below the vascular access.

## 2016-01-21 NOTE — ED Provider Notes (Signed)
[facility] Emergency Department Provider Note  ____________________________________________  Time seen: Approximately 1:02 AM  I have reviewed the triage vital signs and the nursing notes.   HISTORY  Chief Complaint Vascular Access Problem    HPI Luis Walter is a 65 y.o. male with diabetes, end-stage kidney disease who had dialysis this afternoon.  Patient reports he had dialysis, he went home in the early afternoon and after taking his bandage off his left upper arm about 6 PM he notes that it would just continuing to ooze some blood. It was not squirting red blood, and he does not denies that he lost a large amount but states just of purplish bruise that didn't seem to want to stop with his reattempt to bandage it.  Denies any other concerns. No numbness tingling pain or other discomfort. No fevers or chills, redness or swelling.  Reports that now it is been bandaged at the ER it has stopped bleeding, and he has not seen any more bleeding for over an hour.   Past Medical History  Diagnosis Date  . ESRD (end stage renal disease) ([place])     a. on HD M,W,F; b. previously on transplant list at [facility] - removed 2016  . DM2 (diabetes mellitus, type 2) ([place])   . CAD (coronary artery disease)     a. cardiac cath 2014: LM nl, mLAD 30%, dLAD 30%, ostLCx 50%, mid ramus 20% OM3 80% pRCA 20%, mRCA 20%, PDA 100% w/ left to right collats, medically managed  b.10/2015: NSTEMI   . Stroke ([place])   . Chronic systolic CHF (congestive heart failure) ([place])     a. echo 12/2014: EF 35-40%, mod diffuse HK, RWMA cannot be excluded, LV diastolic fxn parameters nl, mild MR, moderately dilated LA, mildly dilated RV internal cavity size, mild TR, PASP nl  . Gallstones   . Dialysis patient [facility])     Patient Active Problem List   Diagnosis Date Noted  . DM (diabetes mellitus) type II controlled with renal manifestation ([place]) 11/10/2015  . End stage renal failure on dialysis ([place])  11/10/2015  . E. coli sepsis ([place]) 10/23/2015  . Diarrhea 10/23/2015  . NSTEMI (non-ST elevated myocardial infarction) ([place]) 10/20/2015  . Allergic rhinitis 07/07/2015  . CAD in native artery 05/02/2015  . Palpitations 03/30/2015  . Neck pain 03/30/2015  . Cervical pain 03/30/2015  . Cephalalgia 03/17/2015  . Type 2 diabetes, controlled, with renal manifestation ([place]) 03/15/2015  . Chronic kidney disease requiring chronic dialysis ([place]) 03/15/2015  . Benign hypertension with ESRD (end-stage renal disease) ([place]) 03/15/2015  . Generalized weakness 03/15/2015  . Elevated troponin 01/23/2015  . Chronic systolic CHF (congestive heart failure) ([place])   . CAD (coronary artery disease)   . Pneumonia 12/25/2014  . ESRD (end stage renal disease) ([place]) 12/25/2014  . History of CVA (cerebrovascular accident) 12/25/2014  . Personal history of transient ischemic attack (TIA) and cerebral infarction without residual deficit 12/25/2014  . Personal history of transient ischemic attack (TIA), and cerebral infarction without residual deficits 12/25/2014  . Abnormal echocardiogram 12/25/2012  . Awaiting organ transplant status 08/26/2012    Past Surgical History  Procedure Laterality Date  . Av fistula placement Left   . Colonoscopy with propofol N/A 05/31/2015    Procedure: COLONOSCOPY WITH PROPOFOL;  Surgeon: Lollie Sails, MD;  Location: [facility] ENDOSCOPY;  Service: Endoscopy;  Laterality: N/A;  . Esophagogastroduodenoscopy (egd) with propofol N/A 05/31/2015    Procedure: ESOPHAGOGASTRODUODENOSCOPY (EGD) WITH PROPOFOL;  Surgeon:  Lollie Sails, MD;  Location: [facility] ENDOSCOPY;  Service: Endoscopy;  Laterality: N/A;    Current Outpatient Rx  Name  Route  Sig  Dispense  Refill  . aspirin EC 81 MG tablet   Oral   Take 81 mg by mouth at bedtime.         Marland Kitchen atorvastatin (LIPITOR) 40 MG tablet   Oral   Take 1 tablet (40 mg total) by mouth daily at 6 PM.   30 tablet   6   . clindamycin  (CLEOCIN T) 1 % lotion   Topical   Apply topically 2 (two) times daily.   60 mL   2   . doxycycline (VIBRA-TABS) 100 MG tablet   Oral   Take 1 tablet (100 mg total) by mouth 2 (two) times daily.   14 tablet   0   . fluticasone (FLONASE) 50 MCG/ACT nasal spray   Each Nare   Place 2 sprays into both nostrils daily.   16 g   11   . loratadine (CLARITIN) 10 MG tablet      Take 1 tablet every 48 hours as needed for allergy symptoms.   30 tablet   11   . metoprolol tartrate (LOPRESSOR) 25 MG tablet   Oral   Take 0.5 tablets (12.5 mg total) by mouth 2 (two) times daily.   60 tablet   6   . Nutritional Supplements (FEEDING SUPPLEMENT, NEPRO CARB STEADY,) LIQD   Oral   Take 237 mLs by mouth 2 (two) times daily between meals.   60 Can   6   . sevelamer carbonate (RENVELA) 800 MG tablet   Oral   Take 3,200 mg by mouth 3 (three) times daily with meals.           Allergies Review of patient's allergies indicates no known allergies.  Family History  Problem Relation Age of Onset  . Hypertension    . Diabetes    . Diabetes Mother   . Diabetes Father   . Hypertension Father     Social History Social History  Substance Use Topics  . Smoking status: Former Research scientist (life sciences)  . Smokeless tobacco: None  . Alcohol Use: No    Review of Systems Constitutional: No fever/chills, does state that he is generally fatigued and chronically but no changes. Eyes: No visual changes. ENT: No sore throat. Cardiovascular: Denies chest pain. Respiratory: Denies shortness of breath. Gastrointestinal: No abdominal pain.  No nausea, no vomiting.  No diarrhea.  No constipation. Genitourinary: Negative for dysuria. Musculoskeletal: Negative for back pain. Skin: Negative for rash. See history of present illness Neurological: Negative for headaches, focal weakness or numbness.  10-point ROS otherwise negative.  ____________________________________________   PHYSICAL EXAM:  VITAL  SIGNS: ED Triage Vitals  Enc Vitals Group     BP 01/20/16 2117 137/80 mmHg     Pulse Rate 01/20/16 2338 70     Resp 01/20/16 2117 20     Temp 01/20/16 2117 98.2 F (36.8 C)     Temp Source 01/20/16 2117 Oral     SpO2 01/20/16 2117 98 %     Weight 01/20/16 2117 180 lb (81.647 kg)     Height 01/20/16 2117 5\' 8"  (1.727 m)     Head Cir --      Peak Flow --      Pain Score 01/20/16 2347 0     Pain Loc --      Pain Edu? --  Excl. in [place]? --    Constitutional: Alert and oriented. Well appearing and in no acute distress.Pleasant, resting comfortably. Eyes: Conjunctivae are normal. PERRL. EOMI. Head: Atraumatic. Nose: No congestion/rhinnorhea. Mouth/Throat: Mucous membranes are moist.  Oropharynx non-erythematous. Neck: No stridor.   Cardiovascular: Normal rate, regular rhythm. Grossly normal heart sounds.  Good peripheral circulation. Respiratory: Normal respiratory effort.  No retractions. Lungs CTAB. Gastrointestinal: Soft and nontender. No distention. No abdominal bruits. No CVA tenderness. Musculoskeletal:   RIGHT Right upper extremity demonstrates normal strength, good use of all muscles. No edema bruising or contusions of the right shoulder/upper arm, right elbow, right forearm / hand. Full range of motion of the right right upper extremity without pain. No evidence of trauma. Strong radial pulse. Intact median/ulnar/radial neuro-muscular exam.  LEFT Left upper extremity demonstrates normal strength, good use of all muscles. No edema bruising or contusions of the left shoulder/upper arm, left elbow, left forearm / hand. The left upper arm has a gauze bandage taped upon with no evidence of further oozing or bleeding into the bandage. Patient has strong radial pulse. Full range of motion of the left  upper extremity without pain. No evidence of trauma. Strong radial pulse. Intact median/ulnar/radial neuro-muscular exam.   Neurologic:  Normal speech and language. No gross focal  neurologic deficits are appreciated. No gait instability. Skin:  Skin is warm, dry and intact. No rash noted. Psychiatric: Mood and affect are normal. Speech and behavior are normal.  ____________________________________________   LABS (all labs ordered are listed, but only abnormal results are displayed)  Labs Reviewed - No data to display ____________________________________________  EKG   ____________________________________________  RADIOLOGY   ____________________________________________   PROCEDURES  Procedure(s) performed: None  Critical Care performed: No  ____________________________________________   INITIAL IMPRESSION / ASSESSMENT AND PLAN / ED COURSE  Pertinent labs & imaging results that were available during my care of the patient were reviewed by me and considered in my medical decision making (see chart for details).  The patient presents for oozing from his left arm hemodialysis site. No evidence of complication, neurologic or vascular deficit. The site had bleeding controlled with simple application of gauze bandage and tape dressing. The patient was observed for over an hour with no further bleeding. We'll discharge the patient home, gave him care and dressing instructions personally at the bedside. Also provided the patient with written discharge and return precautions. He'll continue with his normal dialysis regimen. ____________________________________________   FINAL CLINICAL IMPRESSION(S) / ED DIAGNOSES  Final diagnoses:  Bleeding from venipuncture site      Delman Kitten, MD 01/21/16 [phone]

## 2016-01-23 DIAGNOSIS — D631 Anemia in chronic kidney disease: Secondary | ICD-10-CM | POA: Diagnosis not present

## 2016-01-23 DIAGNOSIS — N2581 Secondary hyperparathyroidism of renal origin: Secondary | ICD-10-CM | POA: Diagnosis not present

## 2016-01-23 DIAGNOSIS — N186 End stage renal disease: Secondary | ICD-10-CM | POA: Diagnosis not present

## 2016-01-25 DIAGNOSIS — D631 Anemia in chronic kidney disease: Secondary | ICD-10-CM | POA: Diagnosis not present

## 2016-01-25 DIAGNOSIS — N186 End stage renal disease: Secondary | ICD-10-CM | POA: Diagnosis not present

## 2016-01-25 DIAGNOSIS — N2581 Secondary hyperparathyroidism of renal origin: Secondary | ICD-10-CM | POA: Diagnosis not present

## 2016-01-27 DIAGNOSIS — D631 Anemia in chronic kidney disease: Secondary | ICD-10-CM | POA: Diagnosis not present

## 2016-01-27 DIAGNOSIS — N186 End stage renal disease: Secondary | ICD-10-CM | POA: Diagnosis not present

## 2016-01-27 DIAGNOSIS — N2581 Secondary hyperparathyroidism of renal origin: Secondary | ICD-10-CM | POA: Diagnosis not present

## 2016-01-30 DIAGNOSIS — N186 End stage renal disease: Secondary | ICD-10-CM | POA: Diagnosis not present

## 2016-01-30 DIAGNOSIS — N2581 Secondary hyperparathyroidism of renal origin: Secondary | ICD-10-CM | POA: Diagnosis not present

## 2016-01-30 DIAGNOSIS — D631 Anemia in chronic kidney disease: Secondary | ICD-10-CM | POA: Diagnosis not present

## 2016-02-01 DIAGNOSIS — N2581 Secondary hyperparathyroidism of renal origin: Secondary | ICD-10-CM | POA: Diagnosis not present

## 2016-02-01 DIAGNOSIS — N186 End stage renal disease: Secondary | ICD-10-CM | POA: Diagnosis not present

## 2016-02-01 DIAGNOSIS — D631 Anemia in chronic kidney disease: Secondary | ICD-10-CM | POA: Diagnosis not present

## 2016-02-03 DIAGNOSIS — N2581 Secondary hyperparathyroidism of renal origin: Secondary | ICD-10-CM | POA: Diagnosis not present

## 2016-02-03 DIAGNOSIS — N186 End stage renal disease: Secondary | ICD-10-CM | POA: Diagnosis not present

## 2016-02-03 DIAGNOSIS — D631 Anemia in chronic kidney disease: Secondary | ICD-10-CM | POA: Diagnosis not present

## 2016-02-06 DIAGNOSIS — N2581 Secondary hyperparathyroidism of renal origin: Secondary | ICD-10-CM | POA: Diagnosis not present

## 2016-02-06 DIAGNOSIS — N186 End stage renal disease: Secondary | ICD-10-CM | POA: Diagnosis not present

## 2016-02-06 DIAGNOSIS — D631 Anemia in chronic kidney disease: Secondary | ICD-10-CM | POA: Diagnosis not present

## 2016-02-08 DIAGNOSIS — E1129 Type 2 diabetes mellitus with other diabetic kidney complication: Secondary | ICD-10-CM | POA: Diagnosis not present

## 2016-02-08 DIAGNOSIS — N2581 Secondary hyperparathyroidism of renal origin: Secondary | ICD-10-CM | POA: Diagnosis not present

## 2016-02-08 DIAGNOSIS — D631 Anemia in chronic kidney disease: Secondary | ICD-10-CM | POA: Diagnosis not present

## 2016-02-08 DIAGNOSIS — N186 End stage renal disease: Secondary | ICD-10-CM | POA: Diagnosis not present

## 2016-02-10 DIAGNOSIS — D631 Anemia in chronic kidney disease: Secondary | ICD-10-CM | POA: Diagnosis not present

## 2016-02-10 DIAGNOSIS — N186 End stage renal disease: Secondary | ICD-10-CM | POA: Diagnosis not present

## 2016-02-10 DIAGNOSIS — N2581 Secondary hyperparathyroidism of renal origin: Secondary | ICD-10-CM | POA: Diagnosis not present

## 2016-02-13 DIAGNOSIS — N186 End stage renal disease: Secondary | ICD-10-CM | POA: Diagnosis not present

## 2016-02-13 DIAGNOSIS — D631 Anemia in chronic kidney disease: Secondary | ICD-10-CM | POA: Diagnosis not present

## 2016-02-13 DIAGNOSIS — N2581 Secondary hyperparathyroidism of renal origin: Secondary | ICD-10-CM | POA: Diagnosis not present

## 2016-02-15 DIAGNOSIS — N2581 Secondary hyperparathyroidism of renal origin: Secondary | ICD-10-CM | POA: Diagnosis not present

## 2016-02-15 DIAGNOSIS — D631 Anemia in chronic kidney disease: Secondary | ICD-10-CM | POA: Diagnosis not present

## 2016-02-15 DIAGNOSIS — N186 End stage renal disease: Secondary | ICD-10-CM | POA: Diagnosis not present

## 2016-02-17 DIAGNOSIS — N2581 Secondary hyperparathyroidism of renal origin: Secondary | ICD-10-CM | POA: Diagnosis not present

## 2016-02-17 DIAGNOSIS — Z992 Dependence on renal dialysis: Secondary | ICD-10-CM | POA: Diagnosis not present

## 2016-02-17 DIAGNOSIS — D631 Anemia in chronic kidney disease: Secondary | ICD-10-CM | POA: Diagnosis not present

## 2016-02-17 DIAGNOSIS — N186 End stage renal disease: Secondary | ICD-10-CM | POA: Diagnosis not present

## 2016-02-17 DIAGNOSIS — E1122 Type 2 diabetes mellitus with diabetic chronic kidney disease: Secondary | ICD-10-CM | POA: Diagnosis not present

## 2016-02-20 DIAGNOSIS — N2581 Secondary hyperparathyroidism of renal origin: Secondary | ICD-10-CM | POA: Diagnosis not present

## 2016-02-20 DIAGNOSIS — D631 Anemia in chronic kidney disease: Secondary | ICD-10-CM | POA: Diagnosis not present

## 2016-02-20 DIAGNOSIS — N186 End stage renal disease: Secondary | ICD-10-CM | POA: Diagnosis not present

## 2016-02-22 DIAGNOSIS — N2581 Secondary hyperparathyroidism of renal origin: Secondary | ICD-10-CM | POA: Diagnosis not present

## 2016-02-22 DIAGNOSIS — D631 Anemia in chronic kidney disease: Secondary | ICD-10-CM | POA: Diagnosis not present

## 2016-02-22 DIAGNOSIS — N186 End stage renal disease: Secondary | ICD-10-CM | POA: Diagnosis not present

## 2016-02-24 DIAGNOSIS — N186 End stage renal disease: Secondary | ICD-10-CM | POA: Diagnosis not present

## 2016-02-24 DIAGNOSIS — N2581 Secondary hyperparathyroidism of renal origin: Secondary | ICD-10-CM | POA: Diagnosis not present

## 2016-02-24 DIAGNOSIS — D631 Anemia in chronic kidney disease: Secondary | ICD-10-CM | POA: Diagnosis not present

## 2016-02-27 DIAGNOSIS — D631 Anemia in chronic kidney disease: Secondary | ICD-10-CM | POA: Diagnosis not present

## 2016-02-27 DIAGNOSIS — N2581 Secondary hyperparathyroidism of renal origin: Secondary | ICD-10-CM | POA: Diagnosis not present

## 2016-02-27 DIAGNOSIS — N186 End stage renal disease: Secondary | ICD-10-CM | POA: Diagnosis not present

## 2016-02-29 DIAGNOSIS — N2581 Secondary hyperparathyroidism of renal origin: Secondary | ICD-10-CM | POA: Diagnosis not present

## 2016-02-29 DIAGNOSIS — N186 End stage renal disease: Secondary | ICD-10-CM | POA: Diagnosis not present

## 2016-02-29 DIAGNOSIS — D631 Anemia in chronic kidney disease: Secondary | ICD-10-CM | POA: Diagnosis not present

## 2016-03-02 DIAGNOSIS — N186 End stage renal disease: Secondary | ICD-10-CM | POA: Diagnosis not present

## 2016-03-02 DIAGNOSIS — N2581 Secondary hyperparathyroidism of renal origin: Secondary | ICD-10-CM | POA: Diagnosis not present

## 2016-03-02 DIAGNOSIS — D631 Anemia in chronic kidney disease: Secondary | ICD-10-CM | POA: Diagnosis not present

## 2016-03-05 DIAGNOSIS — D631 Anemia in chronic kidney disease: Secondary | ICD-10-CM | POA: Diagnosis not present

## 2016-03-05 DIAGNOSIS — N186 End stage renal disease: Secondary | ICD-10-CM | POA: Diagnosis not present

## 2016-03-05 DIAGNOSIS — N2581 Secondary hyperparathyroidism of renal origin: Secondary | ICD-10-CM | POA: Diagnosis not present

## 2016-03-07 DIAGNOSIS — D631 Anemia in chronic kidney disease: Secondary | ICD-10-CM | POA: Diagnosis not present

## 2016-03-07 DIAGNOSIS — N2581 Secondary hyperparathyroidism of renal origin: Secondary | ICD-10-CM | POA: Diagnosis not present

## 2016-03-07 DIAGNOSIS — N186 End stage renal disease: Secondary | ICD-10-CM | POA: Diagnosis not present

## 2016-03-09 DIAGNOSIS — D631 Anemia in chronic kidney disease: Secondary | ICD-10-CM | POA: Diagnosis not present

## 2016-03-09 DIAGNOSIS — N186 End stage renal disease: Secondary | ICD-10-CM | POA: Diagnosis not present

## 2016-03-09 DIAGNOSIS — N2581 Secondary hyperparathyroidism of renal origin: Secondary | ICD-10-CM | POA: Diagnosis not present

## 2016-03-12 DIAGNOSIS — N186 End stage renal disease: Secondary | ICD-10-CM | POA: Diagnosis not present

## 2016-03-12 DIAGNOSIS — D631 Anemia in chronic kidney disease: Secondary | ICD-10-CM | POA: Diagnosis not present

## 2016-03-12 DIAGNOSIS — N2581 Secondary hyperparathyroidism of renal origin: Secondary | ICD-10-CM | POA: Diagnosis not present

## 2016-03-13 ENCOUNTER — Ambulatory Visit: Payer: Medicare Other | Admitting: Cardiovascular Disease

## 2016-03-13 ENCOUNTER — Other Ambulatory Visit: Payer: Medicare Other

## 2016-03-14 DIAGNOSIS — D631 Anemia in chronic kidney disease: Secondary | ICD-10-CM | POA: Diagnosis not present

## 2016-03-14 DIAGNOSIS — N186 End stage renal disease: Secondary | ICD-10-CM | POA: Diagnosis not present

## 2016-03-14 DIAGNOSIS — N2581 Secondary hyperparathyroidism of renal origin: Secondary | ICD-10-CM | POA: Diagnosis not present

## 2016-03-16 DIAGNOSIS — N186 End stage renal disease: Secondary | ICD-10-CM | POA: Diagnosis not present

## 2016-03-16 DIAGNOSIS — N2581 Secondary hyperparathyroidism of renal origin: Secondary | ICD-10-CM | POA: Diagnosis not present

## 2016-03-16 DIAGNOSIS — D631 Anemia in chronic kidney disease: Secondary | ICD-10-CM | POA: Diagnosis not present

## 2016-03-19 DIAGNOSIS — N2581 Secondary hyperparathyroidism of renal origin: Secondary | ICD-10-CM | POA: Diagnosis not present

## 2016-03-19 DIAGNOSIS — N186 End stage renal disease: Secondary | ICD-10-CM | POA: Diagnosis not present

## 2016-03-19 DIAGNOSIS — E1122 Type 2 diabetes mellitus with diabetic chronic kidney disease: Secondary | ICD-10-CM | POA: Diagnosis not present

## 2016-03-19 DIAGNOSIS — Z992 Dependence on renal dialysis: Secondary | ICD-10-CM | POA: Diagnosis not present

## 2016-03-19 DIAGNOSIS — D631 Anemia in chronic kidney disease: Secondary | ICD-10-CM | POA: Diagnosis not present

## 2016-03-21 DIAGNOSIS — D631 Anemia in chronic kidney disease: Secondary | ICD-10-CM | POA: Diagnosis not present

## 2016-03-21 DIAGNOSIS — N186 End stage renal disease: Secondary | ICD-10-CM | POA: Diagnosis not present

## 2016-03-21 DIAGNOSIS — D509 Iron deficiency anemia, unspecified: Secondary | ICD-10-CM | POA: Diagnosis not present

## 2016-03-21 DIAGNOSIS — N2581 Secondary hyperparathyroidism of renal origin: Secondary | ICD-10-CM | POA: Diagnosis not present

## 2016-03-23 DIAGNOSIS — D631 Anemia in chronic kidney disease: Secondary | ICD-10-CM | POA: Diagnosis not present

## 2016-03-23 DIAGNOSIS — N186 End stage renal disease: Secondary | ICD-10-CM | POA: Diagnosis not present

## 2016-03-23 DIAGNOSIS — N2581 Secondary hyperparathyroidism of renal origin: Secondary | ICD-10-CM | POA: Diagnosis not present

## 2016-03-23 DIAGNOSIS — D509 Iron deficiency anemia, unspecified: Secondary | ICD-10-CM | POA: Diagnosis not present

## 2016-03-26 DIAGNOSIS — N186 End stage renal disease: Secondary | ICD-10-CM | POA: Diagnosis not present

## 2016-03-26 DIAGNOSIS — N2581 Secondary hyperparathyroidism of renal origin: Secondary | ICD-10-CM | POA: Diagnosis not present

## 2016-03-26 DIAGNOSIS — D631 Anemia in chronic kidney disease: Secondary | ICD-10-CM | POA: Diagnosis not present

## 2016-03-26 DIAGNOSIS — D509 Iron deficiency anemia, unspecified: Secondary | ICD-10-CM | POA: Diagnosis not present

## 2016-03-27 ENCOUNTER — Ambulatory Visit (INDEPENDENT_AMBULATORY_CARE_PROVIDER_SITE_OTHER): Payer: Medicare Other | Admitting: Cardiovascular Disease

## 2016-03-27 ENCOUNTER — Ambulatory Visit (INDEPENDENT_AMBULATORY_CARE_PROVIDER_SITE_OTHER): Payer: Medicare Other

## 2016-03-27 ENCOUNTER — Other Ambulatory Visit: Payer: Self-pay

## 2016-03-27 ENCOUNTER — Encounter: Payer: Self-pay | Admitting: Cardiovascular Disease

## 2016-03-27 VITALS — BP 120/60 | HR 75 | Ht 68.0 in | Wt 181.8 lb

## 2016-03-27 DIAGNOSIS — E785 Hyperlipidemia, unspecified: Secondary | ICD-10-CM | POA: Diagnosis not present

## 2016-03-27 DIAGNOSIS — I5022 Chronic systolic (congestive) heart failure: Secondary | ICD-10-CM

## 2016-03-27 DIAGNOSIS — I2510 Atherosclerotic heart disease of native coronary artery without angina pectoris: Secondary | ICD-10-CM | POA: Diagnosis not present

## 2016-03-27 NOTE — Patient Instructions (Signed)
Medication Instructions: Continue same medications.   Labwork: None.   Procedures/Testing: None.   Follow-Up: 6 months with Dr. Niko Jakel.   Any Additional Special Instructions Will Be Listed Below (If Applicable).     If you need a refill on your cardiac medications before your next appointment, please call your pharmacy.   

## 2016-03-27 NOTE — Progress Notes (Signed)
Cardiology Office Note   Date:  03/27/2016   ID:  DOTSON GRASER, DOB 06-24-1951, MRN [id number]  PCP:  [name], NP  Cardiologist:   Kathlyn Sacramento, MD   Chief Complaint  Patient presents with  . Other    3 month f/u echo.  Meds reviewed verbally with pt.      History of Present Illness: Luis Walter is a 65 y.o. male who presents for A follow-up visit regarding coronary artery disease and chronic systolic heart failure.  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  He had multiple hospitalizations over the last year mainly for sepsis. Most recent hospitalization in March 2017 was due to Escherichia coli sepsis. It was complicated by non-ST elevation myocardial infarction with a peak troponin of 4. Echocardiogram showed an ejection fraction of 25-30%. The patient was treated medically given the lack of anginal symptoms. He has been doing reasonably well and denies any chest pain or shortness of breath. He did not tolerate the combination of hydralazine/Imdur due to hypotension during dialysis.  Past Medical History:  Diagnosis Date  . CAD (coronary artery disease)    a. cardiac cath 2014: LM nl, mLAD 30%, dLAD 30%, ostLCx 50%, mid ramus 20% OM3 80% pRCA 20%, mRCA 20%, PDA 100% w/ left to right collats, medically managed  b.10/2015: NSTEMI   . Chronic systolic CHF (congestive heart failure) ([place])    a. echo 12/2014: EF 35-40%, mod diffuse HK, RWMA cannot be excluded, LV diastolic fxn parameters nl, mild MR, moderately dilated LA, mildly dilated RV internal cavity size, mild TR, PASP nl  . Dialysis patient ([place])   . DM2 (diabetes mellitus, type 2) ([place])   . ESRD (end stage renal disease) ([place])    a. on HD M,W,F; b. previously on transplant list at [facility] - removed 2016  . Gallstones   . Stroke [facility])      Past Surgical History:  Procedure Laterality Date  . AV FISTULA PLACEMENT Left   . COLONOSCOPY WITH PROPOFOL N/A 05/31/2015   Procedure: COLONOSCOPY WITH PROPOFOL;  Surgeon: Lollie Sails, MD;  Location: [facility] ENDOSCOPY;  Service: Endoscopy;  Laterality: N/A;  . ESOPHAGOGASTRODUODENOSCOPY (EGD) WITH PROPOFOL N/A 05/31/2015   Procedure: ESOPHAGOGASTRODUODENOSCOPY (EGD) WITH PROPOFOL;  Surgeon: Lollie Sails, MD;  Location: [facility] ENDOSCOPY;  Service: Endoscopy;  Laterality: N/A;     Current Outpatient Prescriptions  Medication Sig Dispense Refill  . aspirin EC 81 MG tablet Take 81 mg by mouth at bedtime.    Marland Kitchen atorvastatin (LIPITOR) 40 MG tablet Take 1 tablet (40 mg total) by mouth daily at 6 PM. 30 tablet 6  . clindamycin (CLEOCIN T) 1 % lotion Apply topically 2 (two) times daily. 60 mL 2  . doxycycline (VIBRA-TABS) 100 MG tablet Take 1 tablet (100 mg total) by mouth 2 (two) times daily. 14 tablet 0  . fluticasone (FLONASE) 50 MCG/ACT nasal spray Place 2 sprays into both nostrils daily. 16 g 11  . loratadine (CLARITIN) 10 MG tablet Take 1 tablet every 48 hours as needed for allergy symptoms. 30 tablet 11  . metoprolol tartrate (LOPRESSOR) 25 MG tablet Take 0.5 tablets (12.5 mg total) by mouth 2 (two) times daily. 60 tablet 6  . Nutritional Supplements (FEEDING SUPPLEMENT, NEPRO CARB STEADY,) LIQD Take 237 mLs by mouth  2 (two) times daily between meals. 60 Can 6  . sevelamer carbonate (RENVELA) 800 MG tablet Take 3,200 mg by mouth 3 (three) times daily with meals.     No current facility-administered medications for this visit.     Allergies:   Review of patient's allergies indicates no known allergies.    Social History:  The patient  reports that he has quit smoking. He has never used smokeless tobacco. He reports that he does not drink alcohol or use drugs.   Family History:  The patient's family history includes Diabetes in his father and mother; Hypertension in his father.     ROS:  Please see the history of present illness.   Otherwise, review of systems are positive for none.   All other systems are reviewed and negative.    PHYSICAL EXAM: VS:  BP 120/60 (BP Location: Right Arm, Patient Position: Sitting, Cuff Size: Normal)   Pulse 75   Ht 5\' 8"  (1.727 m)   Wt 181 lb 12 oz (82.4 kg)   BMI 27.63 kg/m  , BMI Body mass index is 27.63 kg/m. GEN: Well nourished, well developed, in no acute distress HEENT: normal Neck: no JVD, carotid bruits, or masses Cardiac: RRR; no murmurs, rubs, or gallops,no edema  Respiratory:  clear to auscultation bilaterally, normal work of breathing GI: soft, nontender, nondistended, + BS MS: no deformity or atrophy Skin: warm and dry, no rash Neuro:  Strength and sensation are intact Psych: euthymic mood, full affect   EKG:  EKG is not ordered today.    Recent Labs: 10/20/2015: ALT 41; B Natriuretic Peptide 4,369.0 10/21/2015: BUN 36; Creatinine, Ser 4.90; Potassium 4.5; Sodium 141 10/23/2015: Hemoglobin 10.2; Platelets 124    Lipid Panel    Component Value Date/Time   CHOL 102 10/21/2015 0805   CHOL 184 05/31/2013 0249   TRIG 88 10/21/2015 0805   TRIG 182 05/31/2013 0249   HDL 29 (L) 10/21/2015 0805   HDL 37 (L) 05/31/2013 0249   CHOLHDL 3.5 10/21/2015 0805   VLDL 18 10/21/2015 0805   VLDL 36 05/31/2013 0249   LDLCALC 55 10/21/2015 0805   LDLCALC 111 (H) 05/31/2013 0249      Wt Readings from Last 3 Encounters:  03/27/16 181 lb 12 oz (82.4 kg)  01/20/16 180 lb (81.6 kg)  01/10/16 180 lb 6.4 oz (81.8 kg)         ASSESSMENT AND PLAN:  1.  Coronary artery disease Involving native coronary arteries without angina . He is overall doing reasonably well with no anginal symptoms. Continue medical therapy.  2. Chronic systolic heart failure:  Most recent ejection fraction was 25-30%. He appears to be euvolemic. He is already on small dose metoprolol and not able to add other heart failure medications due to  hypotension during dialysis. He is not a good candidate for ICD placement given recurrent bacteremia.  3. Hyperlipidemia: Continue treatment with atorvastatin with a target LDL of less than 70. Most recent LDL was 55.    Disposition:   FU with me in 6 months  Signed,  Kathlyn Sacramento, MD  03/27/2016 4:35 PM    [place]

## 2016-03-28 DIAGNOSIS — D509 Iron deficiency anemia, unspecified: Secondary | ICD-10-CM | POA: Diagnosis not present

## 2016-03-28 DIAGNOSIS — D631 Anemia in chronic kidney disease: Secondary | ICD-10-CM | POA: Diagnosis not present

## 2016-03-28 DIAGNOSIS — N186 End stage renal disease: Secondary | ICD-10-CM | POA: Diagnosis not present

## 2016-03-28 DIAGNOSIS — N2581 Secondary hyperparathyroidism of renal origin: Secondary | ICD-10-CM | POA: Diagnosis not present

## 2016-03-28 LAB — ECHOCARDIOGRAM COMPLETE
AV Area VTI index: 1.04 cm2/m2
AV area mean vel ind: 0.94 cm2/m2
AV peak Index: 0.96
AV pk vel: 161 cm/s
AVA: 2.03 cm2
AVAREAMEANV: 1.84 cm2
AVAREAVTI: 1.88 cm2
AVCELMEANRAT: 0.48
AVG: 5 mmHg
AVPG: 10 mmHg
Ao pk vel: 0.5 m/s
Ao-asc: 35 cm
CHL CUP AV VALUE AREA INDEX: 1.04
CHL CUP AV VEL: 2.03
CHL CUP DOP CALC LVOT VTI: 17.2 cm
CHL CUP LVOT MV VTI: 2.09
CHL CUP MV DEC (S): 222
CHL CUP MV M VEL: 96.4
CHL CUP PV REG GRAD DIAS: 5 mmHg
CHL CUP REG VEL DIAS: 117 cm/s
DOP CAL AO MEAN VELOCITY: 104 cm/s
E decel time: 222 msec
E/e' ratio: 13.46
FS: 12 % — AB (ref 28–44)
IVS/LV PW RATIO, ED: 1.06
LA diam index: 2.77 cm/m2
LA vol A4C: 116 ml
LA vol: 104 mL
LASIZE: 54 mm
LAVOLIN: 53.3 mL/m2
LDCA: 3.8 cm2
LEFT ATRIUM END SYS DIAM: 54 mm
LV E/e' medial: 13.46
LV PW d: 10.8 mm — AB (ref 0.6–1.1)
LV TDI E'MEDIAL: 4.9
LV e' LATERAL: 10.7 cm/s
LVEEAVG: 13.46
LVOT MV VTI INDEX: 1.07 cm2/m2
LVOT peak VTI: 0.53 cm
LVOT peak vel: 79.8 cm/s
LVOTD: 22 mm
LVOTSV: 65 mL
MV Annulus VTI: 31.3 cm
MV pk A vel: 61.6 m/s
MVG: 5 mmHg
MVPG: 8 mmHg
MVPKEVEL: 144 m/s
RV TAPSE: 12.7 mm
TDI e' lateral: 10.7
VTI: 32.2 cm

## 2016-03-30 DIAGNOSIS — D509 Iron deficiency anemia, unspecified: Secondary | ICD-10-CM | POA: Diagnosis not present

## 2016-03-30 DIAGNOSIS — N2581 Secondary hyperparathyroidism of renal origin: Secondary | ICD-10-CM | POA: Diagnosis not present

## 2016-03-30 DIAGNOSIS — D631 Anemia in chronic kidney disease: Secondary | ICD-10-CM | POA: Diagnosis not present

## 2016-03-30 DIAGNOSIS — N186 End stage renal disease: Secondary | ICD-10-CM | POA: Diagnosis not present

## 2016-04-02 DIAGNOSIS — N186 End stage renal disease: Secondary | ICD-10-CM | POA: Diagnosis not present

## 2016-04-02 DIAGNOSIS — N2581 Secondary hyperparathyroidism of renal origin: Secondary | ICD-10-CM | POA: Diagnosis not present

## 2016-04-02 DIAGNOSIS — D509 Iron deficiency anemia, unspecified: Secondary | ICD-10-CM | POA: Diagnosis not present

## 2016-04-02 DIAGNOSIS — D631 Anemia in chronic kidney disease: Secondary | ICD-10-CM | POA: Diagnosis not present

## 2016-04-04 DIAGNOSIS — D509 Iron deficiency anemia, unspecified: Secondary | ICD-10-CM | POA: Diagnosis not present

## 2016-04-04 DIAGNOSIS — D631 Anemia in chronic kidney disease: Secondary | ICD-10-CM | POA: Diagnosis not present

## 2016-04-04 DIAGNOSIS — N2581 Secondary hyperparathyroidism of renal origin: Secondary | ICD-10-CM | POA: Diagnosis not present

## 2016-04-04 DIAGNOSIS — N186 End stage renal disease: Secondary | ICD-10-CM | POA: Diagnosis not present

## 2016-04-06 ENCOUNTER — Telehealth: Payer: Self-pay | Admitting: Family Medicine

## 2016-04-06 DIAGNOSIS — D509 Iron deficiency anemia, unspecified: Secondary | ICD-10-CM | POA: Diagnosis not present

## 2016-04-06 DIAGNOSIS — N2581 Secondary hyperparathyroidism of renal origin: Secondary | ICD-10-CM | POA: Diagnosis not present

## 2016-04-06 DIAGNOSIS — H109 Unspecified conjunctivitis: Secondary | ICD-10-CM

## 2016-04-06 DIAGNOSIS — N186 End stage renal disease: Secondary | ICD-10-CM | POA: Diagnosis not present

## 2016-04-06 DIAGNOSIS — D631 Anemia in chronic kidney disease: Secondary | ICD-10-CM | POA: Diagnosis not present

## 2016-04-06 MED ORDER — OFLOXACIN 0.3 % OP SOLN: 2.0000 [drp] | Freq: Four times a day (QID) | OPHTHALMIC | 0 refills | Status: DC

## 2016-04-06 MED ORDER — ERYTHROMYCIN 5 MG/GM OP OINT: 1.0000 "application " | TOPICAL_OINTMENT | Freq: Three times a day (TID) | OPHTHALMIC | 0 refills | Status: DC

## 2016-04-06 NOTE — Addendum Note (Signed)
Addended byVincenza Hews, Karris Deangelo L on: 04/06/2016 04:45 PM   Modules accepted: Orders

## 2016-04-06 NOTE — Telephone Encounter (Signed)
Katrina said pt probably has pink eye.  She asked if something could be called in because he is at dialysis today.  Her call back number is [phone]

## 2016-04-06 NOTE — Telephone Encounter (Signed)
Katrina advised and if it's not improved by Monday she will schedule appointment.

## 2016-04-06 NOTE — Telephone Encounter (Signed)
Drops sent to pharmacy.  Please let them know that if he has severe eye pain or change in vision- he must go to the ER/urgent care to be seen. If symptoms do not improve by Monday, please come in for appt. Thanks! AK

## 2016-04-09 DIAGNOSIS — N2581 Secondary hyperparathyroidism of renal origin: Secondary | ICD-10-CM | POA: Diagnosis not present

## 2016-04-09 DIAGNOSIS — D631 Anemia in chronic kidney disease: Secondary | ICD-10-CM | POA: Diagnosis not present

## 2016-04-09 DIAGNOSIS — N186 End stage renal disease: Secondary | ICD-10-CM | POA: Diagnosis not present

## 2016-04-09 DIAGNOSIS — D509 Iron deficiency anemia, unspecified: Secondary | ICD-10-CM | POA: Diagnosis not present

## 2016-04-11 DIAGNOSIS — N2581 Secondary hyperparathyroidism of renal origin: Secondary | ICD-10-CM | POA: Diagnosis not present

## 2016-04-11 DIAGNOSIS — D631 Anemia in chronic kidney disease: Secondary | ICD-10-CM | POA: Diagnosis not present

## 2016-04-11 DIAGNOSIS — D509 Iron deficiency anemia, unspecified: Secondary | ICD-10-CM | POA: Diagnosis not present

## 2016-04-11 DIAGNOSIS — N186 End stage renal disease: Secondary | ICD-10-CM | POA: Diagnosis not present

## 2016-04-16 DIAGNOSIS — N2581 Secondary hyperparathyroidism of renal origin: Secondary | ICD-10-CM | POA: Diagnosis not present

## 2016-04-16 DIAGNOSIS — N186 End stage renal disease: Secondary | ICD-10-CM | POA: Diagnosis not present

## 2016-04-16 DIAGNOSIS — D509 Iron deficiency anemia, unspecified: Secondary | ICD-10-CM | POA: Diagnosis not present

## 2016-04-16 DIAGNOSIS — D631 Anemia in chronic kidney disease: Secondary | ICD-10-CM | POA: Diagnosis not present

## 2016-04-18 DIAGNOSIS — N186 End stage renal disease: Secondary | ICD-10-CM | POA: Diagnosis not present

## 2016-04-18 DIAGNOSIS — D509 Iron deficiency anemia, unspecified: Secondary | ICD-10-CM | POA: Diagnosis not present

## 2016-04-18 DIAGNOSIS — D631 Anemia in chronic kidney disease: Secondary | ICD-10-CM | POA: Diagnosis not present

## 2016-04-18 DIAGNOSIS — N2581 Secondary hyperparathyroidism of renal origin: Secondary | ICD-10-CM | POA: Diagnosis not present

## 2016-04-19 ENCOUNTER — Ambulatory Visit (INDEPENDENT_AMBULATORY_CARE_PROVIDER_SITE_OTHER): Payer: Medicare Other | Admitting: Family Medicine

## 2016-04-19 ENCOUNTER — Encounter: Payer: Self-pay | Admitting: Family Medicine

## 2016-04-19 VITALS — BP 122/64 | HR 68 | Temp 98.3°F | Resp 16 | Ht 68.0 in | Wt 179.8 lb

## 2016-04-19 DIAGNOSIS — N186 End stage renal disease: Secondary | ICD-10-CM

## 2016-04-19 DIAGNOSIS — E1122 Type 2 diabetes mellitus with diabetic chronic kidney disease: Secondary | ICD-10-CM

## 2016-04-19 DIAGNOSIS — I5022 Chronic systolic (congestive) heart failure: Secondary | ICD-10-CM | POA: Diagnosis not present

## 2016-04-19 DIAGNOSIS — Z992 Dependence on renal dialysis: Secondary | ICD-10-CM

## 2016-04-19 DIAGNOSIS — I2510 Atherosclerotic heart disease of native coronary artery without angina pectoris: Secondary | ICD-10-CM | POA: Diagnosis not present

## 2016-04-19 LAB — POCT GLYCOSYLATED HEMOGLOBIN (HGB A1C): HEMOGLOBIN A1C: 5.6

## 2016-04-19 NOTE — Progress Notes (Signed)
Subjective:    Patient ID: Luis Walter, male    DOB: 11-20-1950, 65 y.o.   MRN: [id number]  HPI: Luis Walter is a 65 y.o. male presenting on 04/19/2016 for Diabetes (dialysis check BS)   HPI  Pt presents for diabetes follow-up.  He is currently on no medications. Dialysis checks his blood sugars. Last A1c in May was 6.0%. Down to 5.6% today. Saw Dr. Corey Skains- [facility](opthalmology) in August of last year. No retinopathy at the time. Is due for eye exam.  Is concerned dialysis is pulling too much off. Feeling drained after HD. Goes to HD MWF.   Past Medical History:  Diagnosis Date  . CAD (coronary artery disease)    a. cardiac cath 2014: LM nl, mLAD 30%, dLAD 30%, ostLCx 50%, mid ramus 20% OM3 80% pRCA 20%, mRCA 20%, PDA 100% w/ left to right collats, medically managed  b.10/2015: NSTEMI   . Chronic systolic CHF (congestive heart failure) ([place])    a. echo 12/2014: EF 35-40%, mod diffuse HK, RWMA cannot be excluded, LV diastolic fxn parameters nl, mild MR, moderately dilated LA, mildly dilated RV internal cavity size, mild TR, PASP nl  . Dialysis patient ([place])   . DM2 (diabetes mellitus, type 2) ([place])   . ESRD (end stage renal disease) ([place])    a. on HD M,W,F; b. [facility] on [facility] - removed 2016  . Gallstones   . Stroke [facility])     Current Outpatient Prescriptions on File Prior to Visit  Medication Sig  . aspirin EC 81 MG tablet Take 81 mg by mouth at bedtime.  Marland Kitchen atorvastatin (LIPITOR) 40 MG tablet Take 1 tablet (40 mg total) by mouth daily at 6 PM.  . clindamycin (CLEOCIN T) 1 % lotion Apply topically 2 (two) times daily.  . fluticasone (FLONASE) 50 MCG/ACT nasal spray Place 2 sprays into both nostrils daily.  Marland Kitchen loratadine (CLARITIN) 10 MG tablet Take 1 tablet every 48 hours as needed for allergy symptoms.  . metoprolol tartrate (LOPRESSOR) 25 MG tablet Take 0.5 tablets (12.5 mg total) by mouth 2 (two) times daily.  . Nutritional Supplements (FEEDING  SUPPLEMENT, NEPRO CARB STEADY,) LIQD Take 237 mLs by mouth 2 (two) times daily between meals.  . sevelamer carbonate (RENVELA) 800 MG tablet Take 3,200 mg by mouth 3 (three) times daily with meals.   No current facility-administered medications on file prior to visit.     Review of Systems  Constitutional: Negative for chills and fever.  HENT: Negative.   Respiratory: Negative for chest tightness, shortness of breath and wheezing.   Cardiovascular: Negative for chest pain, palpitations and leg swelling.  Gastrointestinal: Negative for abdominal pain, nausea and vomiting.  Endocrine: Negative.   Genitourinary: Negative for discharge, dysuria, penile pain, testicular pain and urgency.  Musculoskeletal: Negative for arthralgias, back pain and joint swelling.  Skin: Negative.   Neurological: Negative for dizziness, weakness, numbness and headaches.  Psychiatric/Behavioral: Negative for dysphoric mood and sleep disturbance.   Per HPI unless specifically indicated above     Objective:    BP 122/64 (BP Location: Right Arm, Patient Position: Sitting, Cuff Size: Normal)   Pulse 68   Temp 98.3 F (36.8 C) (Oral)   Resp 16   Ht 5\' 8"  (1.727 m)   Wt 179 lb 12.8 oz (81.6 kg)   BMI 27.34 kg/m   Wt Readings from Last 3 Encounters:  04/19/16 179 lb 12.8 oz (81.6 kg)  03/27/16 181 lb  12 oz (82.4 kg)  01/20/16 180 lb (81.6 kg)    Physical Exam  Constitutional: He is oriented to person, place, and time. He appears well-developed and well-nourished. No distress.  HENT:  Head: Normocephalic and atraumatic.  Neck: Neck supple. No thyromegaly present.  Cardiovascular: Normal rate, regular rhythm, normal heart sounds and normal pulses.  Exam reveals no gallop and no friction rub.   No murmur heard. L arm AV fistula- thrill and bruit intact.   Pulmonary/Chest: Effort normal and breath sounds normal. He has no wheezes.  Abdominal: Soft. Bowel sounds are normal. He exhibits no distension. There  is no tenderness. There is no rebound.  Musculoskeletal: Normal range of motion. He exhibits no edema or tenderness.  Neurological: He is alert and oriented to person, place, and time. He has normal reflexes.  Skin: Skin is warm and dry. No rash noted. No erythema.  Psychiatric: He has a normal mood and affect. His behavior is normal. Thought content normal.   Results for orders placed or performed in visit on 04/19/16  POCT HgB A1C  Result Value Ref Range   Hemoglobin A1C 5.6       Assessment & Plan:   Problem List Items Addressed This Visit      Cardiovascular and Mediastinum   Chronic systolic CHF (congestive heart failure) ([place])    Follows with cardiology. Appears euvolemic at this time. Continue ASA, statin, and beta blocker.         Endocrine   Type 2 diabetes, controlled, with renal manifestation ([place]) - Primary    Well controlled. Continue diet and lifestyle changes. No medications at this time. No urine microalbumin 2/2 ESRD. Foot exam UTD. Eye exam is needed- reminded pt to schedule. Recheck 6 mos.       Relevant Orders   POCT HgB A1C (Completed)     Genitourinary   ESRD on dialysis ([place])    Continue HD MWF. Encouraged pt to discuss concerns with nephrology.       Other Visit Diagnoses   None.     Meds ordered this encounter  Medications  . cinacalcet (SENSIPAR) 30 MG tablet    Sig: Take 30 mg by mouth daily.      Follow up plan: Return in about 6 months (around 10/17/2016), or if symptoms worsen or fail to improve, for Diabetes.

## 2016-04-19 NOTE — Assessment & Plan Note (Signed)
Continue HD MWF. Encouraged pt to discuss concerns with nephrology.

## 2016-04-19 NOTE — Assessment & Plan Note (Signed)
Follows with cardiology. Appears euvolemic at this time. Continue ASA, statin, and beta blocker.

## 2016-04-19 NOTE — Assessment & Plan Note (Signed)
Well controlled. Continue diet and lifestyle changes. No medications at this time. No urine microalbumin 2/2 ESRD. Foot exam UTD. Eye exam is needed- reminded pt to schedule. Recheck 6 mos.

## 2016-04-19 NOTE — Patient Instructions (Signed)
Great job on your diabetes!  Please call [facility] to schedule an eye exam.

## 2016-04-20 DIAGNOSIS — D631 Anemia in chronic kidney disease: Secondary | ICD-10-CM | POA: Diagnosis not present

## 2016-04-20 DIAGNOSIS — N2581 Secondary hyperparathyroidism of renal origin: Secondary | ICD-10-CM | POA: Diagnosis not present

## 2016-04-20 DIAGNOSIS — Z23 Encounter for immunization: Secondary | ICD-10-CM | POA: Diagnosis not present

## 2016-04-20 DIAGNOSIS — N186 End stage renal disease: Secondary | ICD-10-CM | POA: Diagnosis not present

## 2016-04-20 DIAGNOSIS — D509 Iron deficiency anemia, unspecified: Secondary | ICD-10-CM | POA: Diagnosis not present

## 2016-04-23 DIAGNOSIS — D631 Anemia in chronic kidney disease: Secondary | ICD-10-CM | POA: Diagnosis not present

## 2016-04-23 DIAGNOSIS — N186 End stage renal disease: Secondary | ICD-10-CM | POA: Diagnosis not present

## 2016-04-23 DIAGNOSIS — Z23 Encounter for immunization: Secondary | ICD-10-CM | POA: Diagnosis not present

## 2016-04-23 DIAGNOSIS — N2581 Secondary hyperparathyroidism of renal origin: Secondary | ICD-10-CM | POA: Diagnosis not present

## 2016-04-23 DIAGNOSIS — D509 Iron deficiency anemia, unspecified: Secondary | ICD-10-CM | POA: Diagnosis not present

## 2016-04-25 DIAGNOSIS — N2581 Secondary hyperparathyroidism of renal origin: Secondary | ICD-10-CM | POA: Diagnosis not present

## 2016-04-25 DIAGNOSIS — D631 Anemia in chronic kidney disease: Secondary | ICD-10-CM | POA: Diagnosis not present

## 2016-04-25 DIAGNOSIS — N186 End stage renal disease: Secondary | ICD-10-CM | POA: Diagnosis not present

## 2016-04-25 DIAGNOSIS — Z23 Encounter for immunization: Secondary | ICD-10-CM | POA: Diagnosis not present

## 2016-04-25 DIAGNOSIS — D509 Iron deficiency anemia, unspecified: Secondary | ICD-10-CM | POA: Diagnosis not present

## 2016-04-27 DIAGNOSIS — N186 End stage renal disease: Secondary | ICD-10-CM | POA: Diagnosis not present

## 2016-04-27 DIAGNOSIS — D631 Anemia in chronic kidney disease: Secondary | ICD-10-CM | POA: Diagnosis not present

## 2016-04-27 DIAGNOSIS — Z23 Encounter for immunization: Secondary | ICD-10-CM | POA: Diagnosis not present

## 2016-04-27 DIAGNOSIS — D509 Iron deficiency anemia, unspecified: Secondary | ICD-10-CM | POA: Diagnosis not present

## 2016-04-27 DIAGNOSIS — N2581 Secondary hyperparathyroidism of renal origin: Secondary | ICD-10-CM | POA: Diagnosis not present

## 2016-04-29 DIAGNOSIS — D509 Iron deficiency anemia, unspecified: Secondary | ICD-10-CM | POA: Diagnosis not present

## 2016-04-29 DIAGNOSIS — D631 Anemia in chronic kidney disease: Secondary | ICD-10-CM | POA: Diagnosis not present

## 2016-04-29 DIAGNOSIS — N186 End stage renal disease: Secondary | ICD-10-CM | POA: Diagnosis not present

## 2016-04-29 DIAGNOSIS — Z23 Encounter for immunization: Secondary | ICD-10-CM | POA: Diagnosis not present

## 2016-04-29 DIAGNOSIS — N2581 Secondary hyperparathyroidism of renal origin: Secondary | ICD-10-CM | POA: Diagnosis not present

## 2016-05-02 DIAGNOSIS — Z23 Encounter for immunization: Secondary | ICD-10-CM | POA: Diagnosis not present

## 2016-05-02 DIAGNOSIS — D631 Anemia in chronic kidney disease: Secondary | ICD-10-CM | POA: Diagnosis not present

## 2016-05-02 DIAGNOSIS — D509 Iron deficiency anemia, unspecified: Secondary | ICD-10-CM | POA: Diagnosis not present

## 2016-05-02 DIAGNOSIS — N186 End stage renal disease: Secondary | ICD-10-CM | POA: Diagnosis not present

## 2016-05-02 DIAGNOSIS — N2581 Secondary hyperparathyroidism of renal origin: Secondary | ICD-10-CM | POA: Diagnosis not present

## 2016-05-04 DIAGNOSIS — Z23 Encounter for immunization: Secondary | ICD-10-CM | POA: Diagnosis not present

## 2016-05-04 DIAGNOSIS — N186 End stage renal disease: Secondary | ICD-10-CM | POA: Diagnosis not present

## 2016-05-04 DIAGNOSIS — D631 Anemia in chronic kidney disease: Secondary | ICD-10-CM | POA: Diagnosis not present

## 2016-05-04 DIAGNOSIS — N2581 Secondary hyperparathyroidism of renal origin: Secondary | ICD-10-CM | POA: Diagnosis not present

## 2016-05-04 DIAGNOSIS — D509 Iron deficiency anemia, unspecified: Secondary | ICD-10-CM | POA: Diagnosis not present

## 2016-05-07 DIAGNOSIS — D631 Anemia in chronic kidney disease: Secondary | ICD-10-CM | POA: Diagnosis not present

## 2016-05-07 DIAGNOSIS — D509 Iron deficiency anemia, unspecified: Secondary | ICD-10-CM | POA: Diagnosis not present

## 2016-05-07 DIAGNOSIS — Z23 Encounter for immunization: Secondary | ICD-10-CM | POA: Diagnosis not present

## 2016-05-07 DIAGNOSIS — N186 End stage renal disease: Secondary | ICD-10-CM | POA: Diagnosis not present

## 2016-05-07 DIAGNOSIS — N2581 Secondary hyperparathyroidism of renal origin: Secondary | ICD-10-CM | POA: Diagnosis not present

## 2016-05-09 DIAGNOSIS — D631 Anemia in chronic kidney disease: Secondary | ICD-10-CM | POA: Diagnosis not present

## 2016-05-09 DIAGNOSIS — D509 Iron deficiency anemia, unspecified: Secondary | ICD-10-CM | POA: Diagnosis not present

## 2016-05-09 DIAGNOSIS — E1129 Type 2 diabetes mellitus with other diabetic kidney complication: Secondary | ICD-10-CM | POA: Diagnosis not present

## 2016-05-09 DIAGNOSIS — N2581 Secondary hyperparathyroidism of renal origin: Secondary | ICD-10-CM | POA: Diagnosis not present

## 2016-05-09 DIAGNOSIS — N186 End stage renal disease: Secondary | ICD-10-CM | POA: Diagnosis not present

## 2016-05-09 DIAGNOSIS — Z23 Encounter for immunization: Secondary | ICD-10-CM | POA: Diagnosis not present

## 2016-05-11 DIAGNOSIS — N2581 Secondary hyperparathyroidism of renal origin: Secondary | ICD-10-CM | POA: Diagnosis not present

## 2016-05-11 DIAGNOSIS — N186 End stage renal disease: Secondary | ICD-10-CM | POA: Diagnosis not present

## 2016-05-11 DIAGNOSIS — D631 Anemia in chronic kidney disease: Secondary | ICD-10-CM | POA: Diagnosis not present

## 2016-05-11 DIAGNOSIS — Z23 Encounter for immunization: Secondary | ICD-10-CM | POA: Diagnosis not present

## 2016-05-11 DIAGNOSIS — D509 Iron deficiency anemia, unspecified: Secondary | ICD-10-CM | POA: Diagnosis not present

## 2016-05-12 IMAGING — CR DG CHEST 2V
1 series · 2 of 2 positions shown · non-contrast
Comparison: Chest x-ray of 03/04/2015

CLINICAL DATA: Cough for 1 month, history of pneumonia 1 month

EXAM:
CHEST  2 VIEW

[Series 1: pa · 0.17mm/px · 2 of 2 slices shown]
[im 1/2]
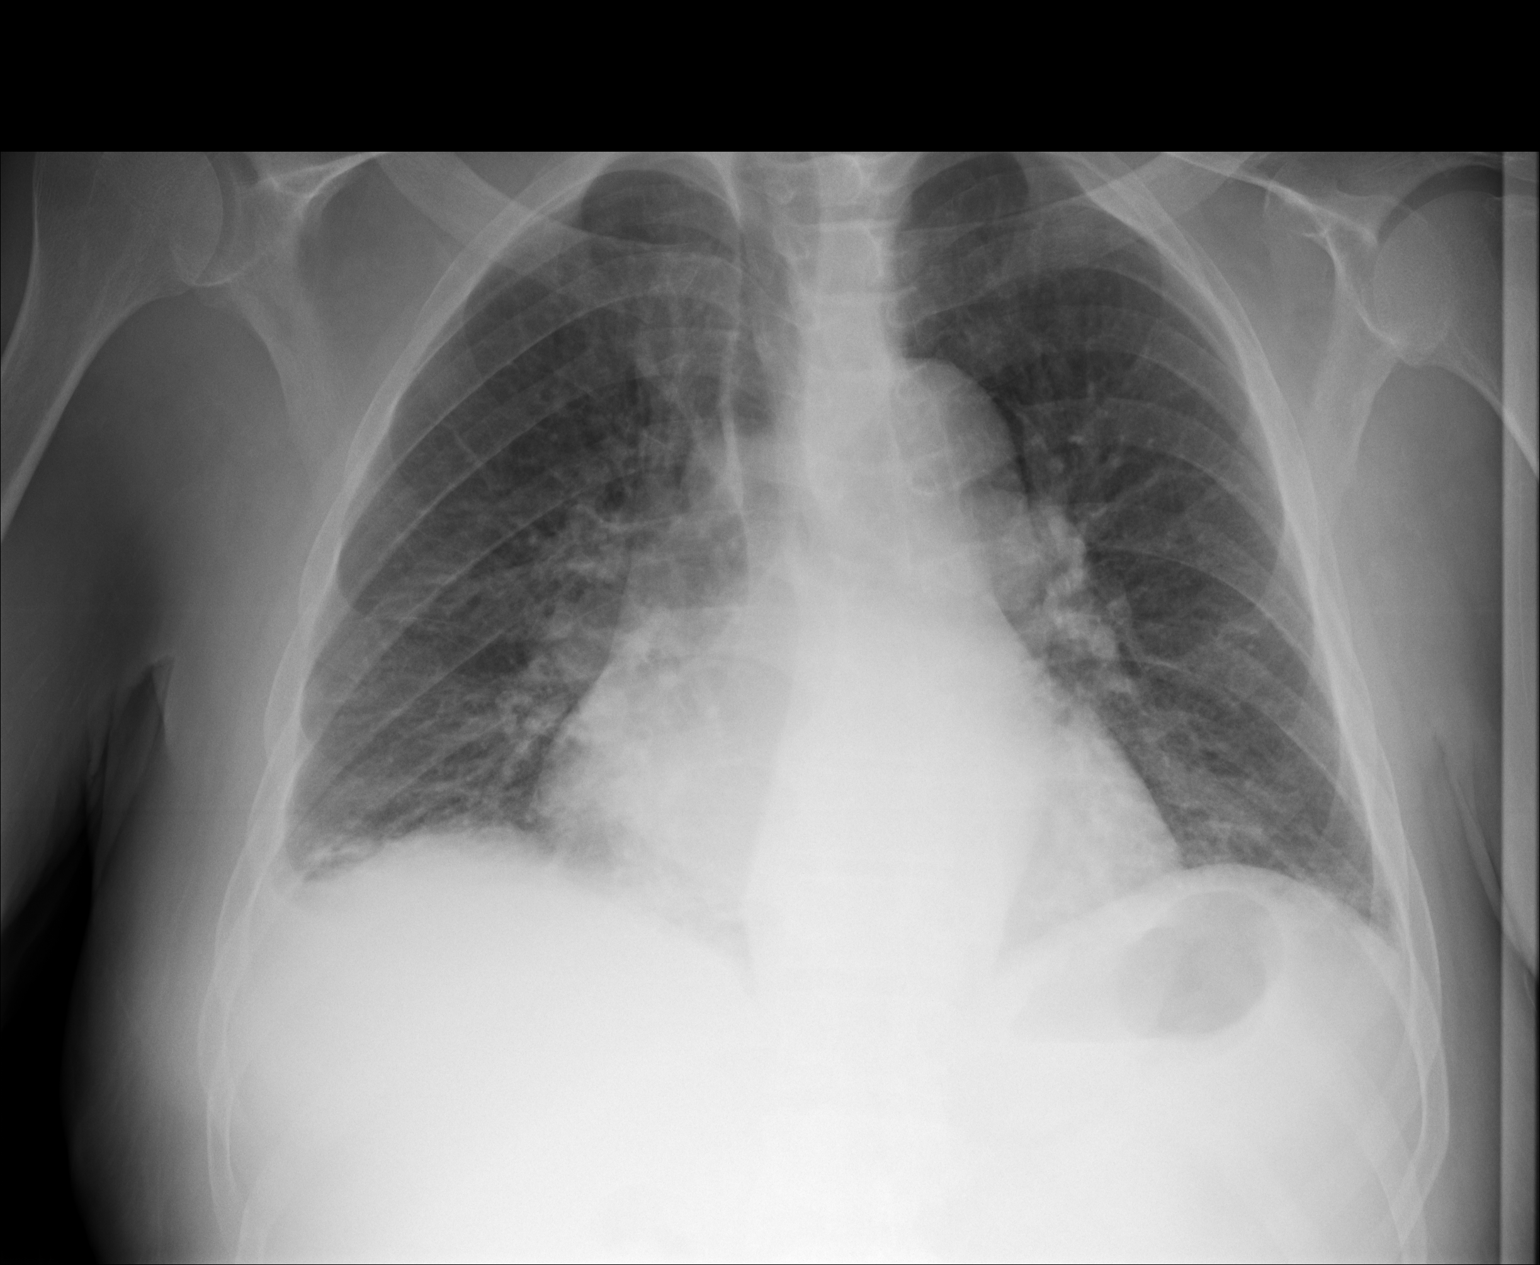
[im 2/2]
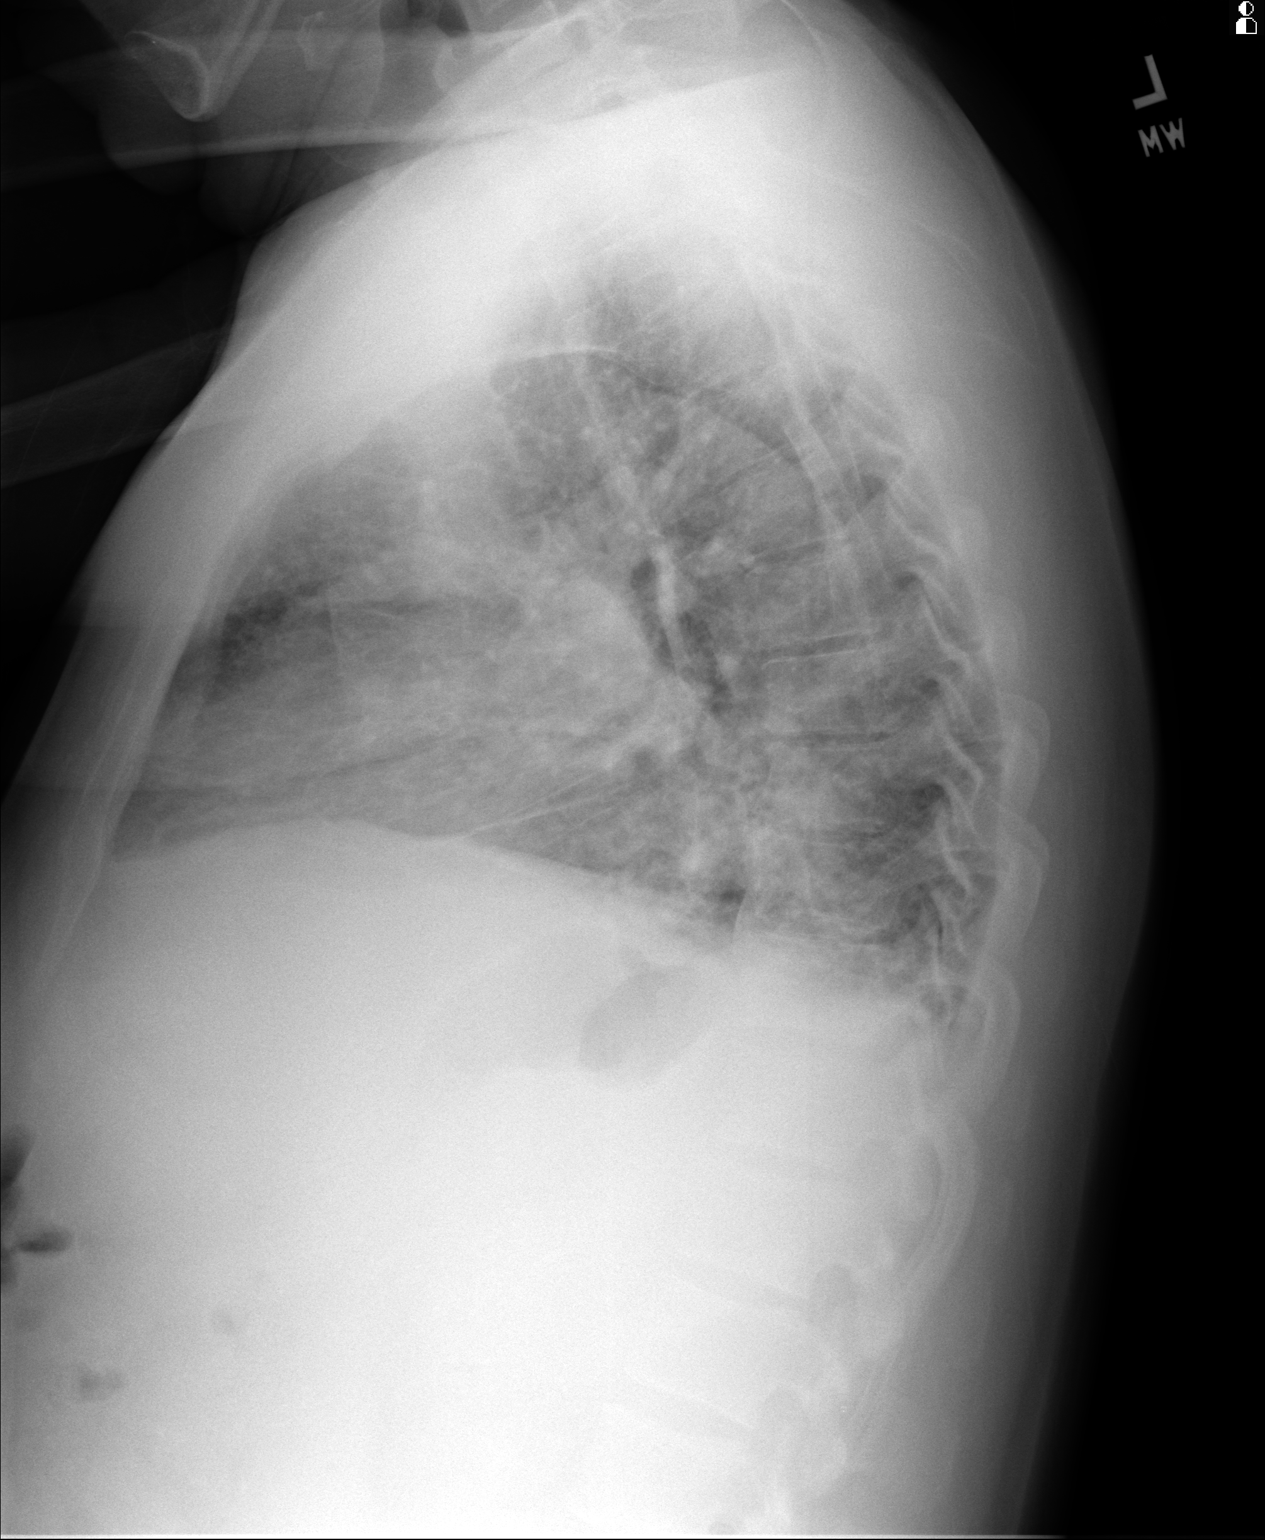

[2 of 2 positions shown; findings below may reference images not displayed]

FINDINGS: There is cardiomegaly present with mild pulmonary vascular
congestion noted. Basilar atelectasis present. No definite pleural
effusion is seen. No bony abnormality is noted.
IMPRESSION: Cardiomegaly and mild pulmonary vascular congestion.

## 2016-05-14 DIAGNOSIS — N186 End stage renal disease: Secondary | ICD-10-CM | POA: Diagnosis not present

## 2016-05-14 DIAGNOSIS — D631 Anemia in chronic kidney disease: Secondary | ICD-10-CM | POA: Diagnosis not present

## 2016-05-14 DIAGNOSIS — D509 Iron deficiency anemia, unspecified: Secondary | ICD-10-CM | POA: Diagnosis not present

## 2016-05-14 DIAGNOSIS — N2581 Secondary hyperparathyroidism of renal origin: Secondary | ICD-10-CM | POA: Diagnosis not present

## 2016-05-14 DIAGNOSIS — Z23 Encounter for immunization: Secondary | ICD-10-CM | POA: Diagnosis not present

## 2016-05-16 DIAGNOSIS — D631 Anemia in chronic kidney disease: Secondary | ICD-10-CM | POA: Diagnosis not present

## 2016-05-16 DIAGNOSIS — N186 End stage renal disease: Secondary | ICD-10-CM | POA: Diagnosis not present

## 2016-05-16 DIAGNOSIS — D509 Iron deficiency anemia, unspecified: Secondary | ICD-10-CM | POA: Diagnosis not present

## 2016-05-16 DIAGNOSIS — Z23 Encounter for immunization: Secondary | ICD-10-CM | POA: Diagnosis not present

## 2016-05-16 DIAGNOSIS — N2581 Secondary hyperparathyroidism of renal origin: Secondary | ICD-10-CM | POA: Diagnosis not present

## 2016-05-18 DIAGNOSIS — Z23 Encounter for immunization: Secondary | ICD-10-CM | POA: Diagnosis not present

## 2016-05-18 DIAGNOSIS — N186 End stage renal disease: Secondary | ICD-10-CM | POA: Diagnosis not present

## 2016-05-18 DIAGNOSIS — D509 Iron deficiency anemia, unspecified: Secondary | ICD-10-CM | POA: Diagnosis not present

## 2016-05-18 DIAGNOSIS — N2581 Secondary hyperparathyroidism of renal origin: Secondary | ICD-10-CM | POA: Diagnosis not present

## 2016-05-18 DIAGNOSIS — D631 Anemia in chronic kidney disease: Secondary | ICD-10-CM | POA: Diagnosis not present

## 2016-05-19 DIAGNOSIS — N186 End stage renal disease: Secondary | ICD-10-CM | POA: Diagnosis not present

## 2016-05-19 DIAGNOSIS — Z992 Dependence on renal dialysis: Secondary | ICD-10-CM | POA: Diagnosis not present

## 2016-05-19 DIAGNOSIS — E1122 Type 2 diabetes mellitus with diabetic chronic kidney disease: Secondary | ICD-10-CM | POA: Diagnosis not present

## 2016-05-21 DIAGNOSIS — D509 Iron deficiency anemia, unspecified: Secondary | ICD-10-CM | POA: Diagnosis not present

## 2016-05-21 DIAGNOSIS — N186 End stage renal disease: Secondary | ICD-10-CM | POA: Diagnosis not present

## 2016-05-21 DIAGNOSIS — N2581 Secondary hyperparathyroidism of renal origin: Secondary | ICD-10-CM | POA: Diagnosis not present

## 2016-05-23 DIAGNOSIS — D509 Iron deficiency anemia, unspecified: Secondary | ICD-10-CM | POA: Diagnosis not present

## 2016-05-23 DIAGNOSIS — N2581 Secondary hyperparathyroidism of renal origin: Secondary | ICD-10-CM | POA: Diagnosis not present

## 2016-05-23 DIAGNOSIS — N186 End stage renal disease: Secondary | ICD-10-CM | POA: Diagnosis not present

## 2016-05-25 DIAGNOSIS — N186 End stage renal disease: Secondary | ICD-10-CM | POA: Diagnosis not present

## 2016-05-25 DIAGNOSIS — N2581 Secondary hyperparathyroidism of renal origin: Secondary | ICD-10-CM | POA: Diagnosis not present

## 2016-05-25 DIAGNOSIS — D509 Iron deficiency anemia, unspecified: Secondary | ICD-10-CM | POA: Diagnosis not present

## 2016-05-28 DIAGNOSIS — D509 Iron deficiency anemia, unspecified: Secondary | ICD-10-CM | POA: Diagnosis not present

## 2016-05-28 DIAGNOSIS — N2581 Secondary hyperparathyroidism of renal origin: Secondary | ICD-10-CM | POA: Diagnosis not present

## 2016-05-28 DIAGNOSIS — N186 End stage renal disease: Secondary | ICD-10-CM | POA: Diagnosis not present

## 2016-05-30 DIAGNOSIS — N2581 Secondary hyperparathyroidism of renal origin: Secondary | ICD-10-CM | POA: Diagnosis not present

## 2016-05-30 DIAGNOSIS — N186 End stage renal disease: Secondary | ICD-10-CM | POA: Diagnosis not present

## 2016-05-30 DIAGNOSIS — D509 Iron deficiency anemia, unspecified: Secondary | ICD-10-CM | POA: Diagnosis not present

## 2016-06-01 DIAGNOSIS — N186 End stage renal disease: Secondary | ICD-10-CM | POA: Diagnosis not present

## 2016-06-01 DIAGNOSIS — D509 Iron deficiency anemia, unspecified: Secondary | ICD-10-CM | POA: Diagnosis not present

## 2016-06-01 DIAGNOSIS — N2581 Secondary hyperparathyroidism of renal origin: Secondary | ICD-10-CM | POA: Diagnosis not present

## 2016-06-04 DIAGNOSIS — N2581 Secondary hyperparathyroidism of renal origin: Secondary | ICD-10-CM | POA: Diagnosis not present

## 2016-06-04 DIAGNOSIS — D509 Iron deficiency anemia, unspecified: Secondary | ICD-10-CM | POA: Diagnosis not present

## 2016-06-04 DIAGNOSIS — N186 End stage renal disease: Secondary | ICD-10-CM | POA: Diagnosis not present

## 2016-06-06 DIAGNOSIS — D509 Iron deficiency anemia, unspecified: Secondary | ICD-10-CM | POA: Diagnosis not present

## 2016-06-06 DIAGNOSIS — N186 End stage renal disease: Secondary | ICD-10-CM | POA: Diagnosis not present

## 2016-06-06 DIAGNOSIS — N2581 Secondary hyperparathyroidism of renal origin: Secondary | ICD-10-CM | POA: Diagnosis not present

## 2016-06-07 ENCOUNTER — Ambulatory Visit: Payer: Medicare Other | Admitting: Family Medicine

## 2016-06-08 DIAGNOSIS — D509 Iron deficiency anemia, unspecified: Secondary | ICD-10-CM | POA: Diagnosis not present

## 2016-06-08 DIAGNOSIS — N186 End stage renal disease: Secondary | ICD-10-CM | POA: Diagnosis not present

## 2016-06-08 DIAGNOSIS — N2581 Secondary hyperparathyroidism of renal origin: Secondary | ICD-10-CM | POA: Diagnosis not present

## 2016-06-11 DIAGNOSIS — N2581 Secondary hyperparathyroidism of renal origin: Secondary | ICD-10-CM | POA: Diagnosis not present

## 2016-06-11 DIAGNOSIS — D509 Iron deficiency anemia, unspecified: Secondary | ICD-10-CM | POA: Diagnosis not present

## 2016-06-11 DIAGNOSIS — N186 End stage renal disease: Secondary | ICD-10-CM | POA: Diagnosis not present

## 2016-06-13 DIAGNOSIS — D509 Iron deficiency anemia, unspecified: Secondary | ICD-10-CM | POA: Diagnosis not present

## 2016-06-13 DIAGNOSIS — N2581 Secondary hyperparathyroidism of renal origin: Secondary | ICD-10-CM | POA: Diagnosis not present

## 2016-06-13 DIAGNOSIS — N186 End stage renal disease: Secondary | ICD-10-CM | POA: Diagnosis not present

## 2016-06-15 DIAGNOSIS — N186 End stage renal disease: Secondary | ICD-10-CM | POA: Diagnosis not present

## 2016-06-15 DIAGNOSIS — N2581 Secondary hyperparathyroidism of renal origin: Secondary | ICD-10-CM | POA: Diagnosis not present

## 2016-06-15 DIAGNOSIS — D509 Iron deficiency anemia, unspecified: Secondary | ICD-10-CM | POA: Diagnosis not present

## 2016-06-18 DIAGNOSIS — D509 Iron deficiency anemia, unspecified: Secondary | ICD-10-CM | POA: Diagnosis not present

## 2016-06-18 DIAGNOSIS — N2581 Secondary hyperparathyroidism of renal origin: Secondary | ICD-10-CM | POA: Diagnosis not present

## 2016-06-18 DIAGNOSIS — N186 End stage renal disease: Secondary | ICD-10-CM | POA: Diagnosis not present

## 2016-06-19 DIAGNOSIS — Z992 Dependence on renal dialysis: Secondary | ICD-10-CM | POA: Diagnosis not present

## 2016-06-19 DIAGNOSIS — E1122 Type 2 diabetes mellitus with diabetic chronic kidney disease: Secondary | ICD-10-CM | POA: Diagnosis not present

## 2016-06-19 DIAGNOSIS — N186 End stage renal disease: Secondary | ICD-10-CM | POA: Diagnosis not present

## 2016-06-20 DIAGNOSIS — N2581 Secondary hyperparathyroidism of renal origin: Secondary | ICD-10-CM | POA: Diagnosis not present

## 2016-06-20 DIAGNOSIS — D509 Iron deficiency anemia, unspecified: Secondary | ICD-10-CM | POA: Diagnosis not present

## 2016-06-20 DIAGNOSIS — N186 End stage renal disease: Secondary | ICD-10-CM | POA: Diagnosis not present

## 2016-06-20 DIAGNOSIS — D631 Anemia in chronic kidney disease: Secondary | ICD-10-CM | POA: Diagnosis not present

## 2016-06-22 DIAGNOSIS — N186 End stage renal disease: Secondary | ICD-10-CM | POA: Diagnosis not present

## 2016-06-22 DIAGNOSIS — N2581 Secondary hyperparathyroidism of renal origin: Secondary | ICD-10-CM | POA: Diagnosis not present

## 2016-06-22 DIAGNOSIS — D631 Anemia in chronic kidney disease: Secondary | ICD-10-CM | POA: Diagnosis not present

## 2016-06-22 DIAGNOSIS — D509 Iron deficiency anemia, unspecified: Secondary | ICD-10-CM | POA: Diagnosis not present

## 2016-06-25 DIAGNOSIS — D631 Anemia in chronic kidney disease: Secondary | ICD-10-CM | POA: Diagnosis not present

## 2016-06-25 DIAGNOSIS — N2581 Secondary hyperparathyroidism of renal origin: Secondary | ICD-10-CM | POA: Diagnosis not present

## 2016-06-25 DIAGNOSIS — D509 Iron deficiency anemia, unspecified: Secondary | ICD-10-CM | POA: Diagnosis not present

## 2016-06-25 DIAGNOSIS — N186 End stage renal disease: Secondary | ICD-10-CM | POA: Diagnosis not present

## 2016-06-27 DIAGNOSIS — D509 Iron deficiency anemia, unspecified: Secondary | ICD-10-CM | POA: Diagnosis not present

## 2016-06-27 DIAGNOSIS — N2581 Secondary hyperparathyroidism of renal origin: Secondary | ICD-10-CM | POA: Diagnosis not present

## 2016-06-27 DIAGNOSIS — N186 End stage renal disease: Secondary | ICD-10-CM | POA: Diagnosis not present

## 2016-06-27 DIAGNOSIS — D631 Anemia in chronic kidney disease: Secondary | ICD-10-CM | POA: Diagnosis not present

## 2016-06-29 DIAGNOSIS — N186 End stage renal disease: Secondary | ICD-10-CM | POA: Diagnosis not present

## 2016-06-29 DIAGNOSIS — D631 Anemia in chronic kidney disease: Secondary | ICD-10-CM | POA: Diagnosis not present

## 2016-06-29 DIAGNOSIS — D509 Iron deficiency anemia, unspecified: Secondary | ICD-10-CM | POA: Diagnosis not present

## 2016-06-29 DIAGNOSIS — N2581 Secondary hyperparathyroidism of renal origin: Secondary | ICD-10-CM | POA: Diagnosis not present

## 2016-07-02 ENCOUNTER — Telehealth: Payer: Self-pay | Admitting: Family Medicine

## 2016-07-02 DIAGNOSIS — N2581 Secondary hyperparathyroidism of renal origin: Secondary | ICD-10-CM | POA: Diagnosis not present

## 2016-07-02 DIAGNOSIS — N186 End stage renal disease: Secondary | ICD-10-CM | POA: Diagnosis not present

## 2016-07-02 DIAGNOSIS — D631 Anemia in chronic kidney disease: Secondary | ICD-10-CM | POA: Diagnosis not present

## 2016-07-02 DIAGNOSIS — D509 Iron deficiency anemia, unspecified: Secondary | ICD-10-CM | POA: Diagnosis not present

## 2016-07-02 MED ORDER — METOPROLOL TARTRATE 25 MG PO TABS: 12.5000 mg | ORAL_TABLET | Freq: Two times a day (BID) | ORAL | 3 refills | Status: DC

## 2016-07-02 MED ORDER — ATORVASTATIN CALCIUM 40 MG PO TABS: 40.0000 mg | ORAL_TABLET | Freq: Every day | ORAL | 3 refills | Status: DC

## 2016-07-02 NOTE — Telephone Encounter (Signed)
Done.Ida 

## 2016-07-02 NOTE — Telephone Encounter (Signed)
Katrina said [facility] should be faxing prescription requests for metropolol and atorvastatin for pt.  Her call back number is [phone]

## 2016-07-04 DIAGNOSIS — D509 Iron deficiency anemia, unspecified: Secondary | ICD-10-CM | POA: Diagnosis not present

## 2016-07-04 DIAGNOSIS — N2581 Secondary hyperparathyroidism of renal origin: Secondary | ICD-10-CM | POA: Diagnosis not present

## 2016-07-04 DIAGNOSIS — D631 Anemia in chronic kidney disease: Secondary | ICD-10-CM | POA: Diagnosis not present

## 2016-07-04 DIAGNOSIS — N186 End stage renal disease: Secondary | ICD-10-CM | POA: Diagnosis not present

## 2016-07-06 DIAGNOSIS — N2581 Secondary hyperparathyroidism of renal origin: Secondary | ICD-10-CM | POA: Diagnosis not present

## 2016-07-06 DIAGNOSIS — D631 Anemia in chronic kidney disease: Secondary | ICD-10-CM | POA: Diagnosis not present

## 2016-07-06 DIAGNOSIS — D509 Iron deficiency anemia, unspecified: Secondary | ICD-10-CM | POA: Diagnosis not present

## 2016-07-06 DIAGNOSIS — N186 End stage renal disease: Secondary | ICD-10-CM | POA: Diagnosis not present

## 2016-07-08 DIAGNOSIS — D631 Anemia in chronic kidney disease: Secondary | ICD-10-CM | POA: Diagnosis not present

## 2016-07-08 DIAGNOSIS — D509 Iron deficiency anemia, unspecified: Secondary | ICD-10-CM | POA: Diagnosis not present

## 2016-07-08 DIAGNOSIS — N2581 Secondary hyperparathyroidism of renal origin: Secondary | ICD-10-CM | POA: Diagnosis not present

## 2016-07-08 DIAGNOSIS — N186 End stage renal disease: Secondary | ICD-10-CM | POA: Diagnosis not present

## 2016-07-10 DIAGNOSIS — N186 End stage renal disease: Secondary | ICD-10-CM | POA: Diagnosis not present

## 2016-07-10 DIAGNOSIS — D509 Iron deficiency anemia, unspecified: Secondary | ICD-10-CM | POA: Diagnosis not present

## 2016-07-10 DIAGNOSIS — N2581 Secondary hyperparathyroidism of renal origin: Secondary | ICD-10-CM | POA: Diagnosis not present

## 2016-07-10 DIAGNOSIS — D631 Anemia in chronic kidney disease: Secondary | ICD-10-CM | POA: Diagnosis not present

## 2016-07-13 DIAGNOSIS — N2581 Secondary hyperparathyroidism of renal origin: Secondary | ICD-10-CM | POA: Diagnosis not present

## 2016-07-13 DIAGNOSIS — N186 End stage renal disease: Secondary | ICD-10-CM | POA: Diagnosis not present

## 2016-07-13 DIAGNOSIS — D631 Anemia in chronic kidney disease: Secondary | ICD-10-CM | POA: Diagnosis not present

## 2016-07-13 DIAGNOSIS — D509 Iron deficiency anemia, unspecified: Secondary | ICD-10-CM | POA: Diagnosis not present

## 2016-07-16 DIAGNOSIS — D631 Anemia in chronic kidney disease: Secondary | ICD-10-CM | POA: Diagnosis not present

## 2016-07-16 DIAGNOSIS — D509 Iron deficiency anemia, unspecified: Secondary | ICD-10-CM | POA: Diagnosis not present

## 2016-07-16 DIAGNOSIS — N2581 Secondary hyperparathyroidism of renal origin: Secondary | ICD-10-CM | POA: Diagnosis not present

## 2016-07-16 DIAGNOSIS — N186 End stage renal disease: Secondary | ICD-10-CM | POA: Diagnosis not present

## 2016-07-18 DIAGNOSIS — N186 End stage renal disease: Secondary | ICD-10-CM | POA: Diagnosis not present

## 2016-07-18 DIAGNOSIS — D509 Iron deficiency anemia, unspecified: Secondary | ICD-10-CM | POA: Diagnosis not present

## 2016-07-18 DIAGNOSIS — D631 Anemia in chronic kidney disease: Secondary | ICD-10-CM | POA: Diagnosis not present

## 2016-07-18 DIAGNOSIS — N2581 Secondary hyperparathyroidism of renal origin: Secondary | ICD-10-CM | POA: Diagnosis not present

## 2016-07-19 DIAGNOSIS — E1122 Type 2 diabetes mellitus with diabetic chronic kidney disease: Secondary | ICD-10-CM | POA: Diagnosis not present

## 2016-07-19 DIAGNOSIS — N186 End stage renal disease: Secondary | ICD-10-CM | POA: Diagnosis not present

## 2016-07-19 DIAGNOSIS — Z992 Dependence on renal dialysis: Secondary | ICD-10-CM | POA: Diagnosis not present

## 2016-07-20 DIAGNOSIS — N186 End stage renal disease: Secondary | ICD-10-CM | POA: Diagnosis not present

## 2016-07-20 DIAGNOSIS — D631 Anemia in chronic kidney disease: Secondary | ICD-10-CM | POA: Diagnosis not present

## 2016-07-20 DIAGNOSIS — D509 Iron deficiency anemia, unspecified: Secondary | ICD-10-CM | POA: Diagnosis not present

## 2016-07-20 DIAGNOSIS — N2581 Secondary hyperparathyroidism of renal origin: Secondary | ICD-10-CM | POA: Diagnosis not present

## 2016-07-23 DIAGNOSIS — N186 End stage renal disease: Secondary | ICD-10-CM | POA: Diagnosis not present

## 2016-07-23 DIAGNOSIS — D509 Iron deficiency anemia, unspecified: Secondary | ICD-10-CM | POA: Diagnosis not present

## 2016-07-23 DIAGNOSIS — N2581 Secondary hyperparathyroidism of renal origin: Secondary | ICD-10-CM | POA: Diagnosis not present

## 2016-07-23 DIAGNOSIS — D631 Anemia in chronic kidney disease: Secondary | ICD-10-CM | POA: Diagnosis not present

## 2016-07-25 DIAGNOSIS — E1129 Type 2 diabetes mellitus with other diabetic kidney complication: Secondary | ICD-10-CM | POA: Diagnosis not present

## 2016-07-25 DIAGNOSIS — D631 Anemia in chronic kidney disease: Secondary | ICD-10-CM | POA: Diagnosis not present

## 2016-07-25 DIAGNOSIS — N186 End stage renal disease: Secondary | ICD-10-CM | POA: Diagnosis not present

## 2016-07-25 DIAGNOSIS — D509 Iron deficiency anemia, unspecified: Secondary | ICD-10-CM | POA: Diagnosis not present

## 2016-07-25 DIAGNOSIS — N2581 Secondary hyperparathyroidism of renal origin: Secondary | ICD-10-CM | POA: Diagnosis not present

## 2016-07-27 DIAGNOSIS — N186 End stage renal disease: Secondary | ICD-10-CM | POA: Diagnosis not present

## 2016-07-27 DIAGNOSIS — D631 Anemia in chronic kidney disease: Secondary | ICD-10-CM | POA: Diagnosis not present

## 2016-07-27 DIAGNOSIS — D509 Iron deficiency anemia, unspecified: Secondary | ICD-10-CM | POA: Diagnosis not present

## 2016-07-27 DIAGNOSIS — N2581 Secondary hyperparathyroidism of renal origin: Secondary | ICD-10-CM | POA: Diagnosis not present

## 2016-07-30 DIAGNOSIS — N2581 Secondary hyperparathyroidism of renal origin: Secondary | ICD-10-CM | POA: Diagnosis not present

## 2016-07-30 DIAGNOSIS — D631 Anemia in chronic kidney disease: Secondary | ICD-10-CM | POA: Diagnosis not present

## 2016-07-30 DIAGNOSIS — D509 Iron deficiency anemia, unspecified: Secondary | ICD-10-CM | POA: Diagnosis not present

## 2016-07-30 DIAGNOSIS — N186 End stage renal disease: Secondary | ICD-10-CM | POA: Diagnosis not present

## 2016-08-01 DIAGNOSIS — N2581 Secondary hyperparathyroidism of renal origin: Secondary | ICD-10-CM | POA: Diagnosis not present

## 2016-08-01 DIAGNOSIS — D509 Iron deficiency anemia, unspecified: Secondary | ICD-10-CM | POA: Diagnosis not present

## 2016-08-01 DIAGNOSIS — D631 Anemia in chronic kidney disease: Secondary | ICD-10-CM | POA: Diagnosis not present

## 2016-08-01 DIAGNOSIS — N186 End stage renal disease: Secondary | ICD-10-CM | POA: Diagnosis not present

## 2016-08-03 DIAGNOSIS — D631 Anemia in chronic kidney disease: Secondary | ICD-10-CM | POA: Diagnosis not present

## 2016-08-03 DIAGNOSIS — D509 Iron deficiency anemia, unspecified: Secondary | ICD-10-CM | POA: Diagnosis not present

## 2016-08-03 DIAGNOSIS — N2581 Secondary hyperparathyroidism of renal origin: Secondary | ICD-10-CM | POA: Diagnosis not present

## 2016-08-03 DIAGNOSIS — N186 End stage renal disease: Secondary | ICD-10-CM | POA: Diagnosis not present

## 2016-08-06 DIAGNOSIS — N186 End stage renal disease: Secondary | ICD-10-CM | POA: Diagnosis not present

## 2016-08-06 DIAGNOSIS — D509 Iron deficiency anemia, unspecified: Secondary | ICD-10-CM | POA: Diagnosis not present

## 2016-08-06 DIAGNOSIS — D631 Anemia in chronic kidney disease: Secondary | ICD-10-CM | POA: Diagnosis not present

## 2016-08-06 DIAGNOSIS — N2581 Secondary hyperparathyroidism of renal origin: Secondary | ICD-10-CM | POA: Diagnosis not present

## 2016-08-08 DIAGNOSIS — N2581 Secondary hyperparathyroidism of renal origin: Secondary | ICD-10-CM | POA: Diagnosis not present

## 2016-08-08 DIAGNOSIS — N186 End stage renal disease: Secondary | ICD-10-CM | POA: Diagnosis not present

## 2016-08-08 DIAGNOSIS — D631 Anemia in chronic kidney disease: Secondary | ICD-10-CM | POA: Diagnosis not present

## 2016-08-08 DIAGNOSIS — D509 Iron deficiency anemia, unspecified: Secondary | ICD-10-CM | POA: Diagnosis not present

## 2016-08-10 DIAGNOSIS — D631 Anemia in chronic kidney disease: Secondary | ICD-10-CM | POA: Diagnosis not present

## 2016-08-10 DIAGNOSIS — N186 End stage renal disease: Secondary | ICD-10-CM | POA: Diagnosis not present

## 2016-08-10 DIAGNOSIS — D509 Iron deficiency anemia, unspecified: Secondary | ICD-10-CM | POA: Diagnosis not present

## 2016-08-10 DIAGNOSIS — N2581 Secondary hyperparathyroidism of renal origin: Secondary | ICD-10-CM | POA: Diagnosis not present

## 2016-08-12 DIAGNOSIS — D509 Iron deficiency anemia, unspecified: Secondary | ICD-10-CM | POA: Diagnosis not present

## 2016-08-12 DIAGNOSIS — N186 End stage renal disease: Secondary | ICD-10-CM | POA: Diagnosis not present

## 2016-08-12 DIAGNOSIS — N2581 Secondary hyperparathyroidism of renal origin: Secondary | ICD-10-CM | POA: Diagnosis not present

## 2016-08-12 DIAGNOSIS — D631 Anemia in chronic kidney disease: Secondary | ICD-10-CM | POA: Diagnosis not present

## 2016-08-15 DIAGNOSIS — D509 Iron deficiency anemia, unspecified: Secondary | ICD-10-CM | POA: Diagnosis not present

## 2016-08-15 DIAGNOSIS — N186 End stage renal disease: Secondary | ICD-10-CM | POA: Diagnosis not present

## 2016-08-15 DIAGNOSIS — N2581 Secondary hyperparathyroidism of renal origin: Secondary | ICD-10-CM | POA: Diagnosis not present

## 2016-08-15 DIAGNOSIS — D631 Anemia in chronic kidney disease: Secondary | ICD-10-CM | POA: Diagnosis not present

## 2016-08-17 DIAGNOSIS — N186 End stage renal disease: Secondary | ICD-10-CM | POA: Diagnosis not present

## 2016-08-17 DIAGNOSIS — N2581 Secondary hyperparathyroidism of renal origin: Secondary | ICD-10-CM | POA: Diagnosis not present

## 2016-08-17 DIAGNOSIS — D509 Iron deficiency anemia, unspecified: Secondary | ICD-10-CM | POA: Diagnosis not present

## 2016-08-17 DIAGNOSIS — D631 Anemia in chronic kidney disease: Secondary | ICD-10-CM | POA: Diagnosis not present

## 2016-08-19 DIAGNOSIS — N186 End stage renal disease: Secondary | ICD-10-CM | POA: Diagnosis not present

## 2016-08-19 DIAGNOSIS — N2581 Secondary hyperparathyroidism of renal origin: Secondary | ICD-10-CM | POA: Diagnosis not present

## 2016-08-19 DIAGNOSIS — Z992 Dependence on renal dialysis: Secondary | ICD-10-CM | POA: Diagnosis not present

## 2016-08-19 DIAGNOSIS — D509 Iron deficiency anemia, unspecified: Secondary | ICD-10-CM | POA: Diagnosis not present

## 2016-08-19 DIAGNOSIS — D631 Anemia in chronic kidney disease: Secondary | ICD-10-CM | POA: Diagnosis not present

## 2016-08-19 DIAGNOSIS — E1122 Type 2 diabetes mellitus with diabetic chronic kidney disease: Secondary | ICD-10-CM | POA: Diagnosis not present

## 2016-08-22 DIAGNOSIS — D631 Anemia in chronic kidney disease: Secondary | ICD-10-CM | POA: Diagnosis not present

## 2016-08-22 DIAGNOSIS — N2581 Secondary hyperparathyroidism of renal origin: Secondary | ICD-10-CM | POA: Diagnosis not present

## 2016-08-22 DIAGNOSIS — D509 Iron deficiency anemia, unspecified: Secondary | ICD-10-CM | POA: Diagnosis not present

## 2016-08-22 DIAGNOSIS — N186 End stage renal disease: Secondary | ICD-10-CM | POA: Diagnosis not present

## 2016-08-24 ENCOUNTER — Ambulatory Visit
Admission: RE | Admit: 2016-08-24 | Discharge: 2016-08-24 | Disposition: A | Discharge status: Home / Self Care | Payer: Medicare Other | Source: Ambulatory Visit | Attending: Family Medicine | Admitting: Family Medicine

## 2016-08-24 ENCOUNTER — Encounter: Payer: Self-pay | Admitting: Family Medicine

## 2016-08-24 ENCOUNTER — Ambulatory Visit (INDEPENDENT_AMBULATORY_CARE_PROVIDER_SITE_OTHER): Payer: Medicare Other | Admitting: Family Medicine

## 2016-08-24 VITALS — BP 128/71 | HR 83 | Temp 98.1°F | Resp 16 | Ht 68.0 in | Wt 179.0 lb

## 2016-08-24 DIAGNOSIS — J069 Acute upper respiratory infection, unspecified: Secondary | ICD-10-CM

## 2016-08-24 DIAGNOSIS — J9811 Atelectasis: Secondary | ICD-10-CM | POA: Insufficient documentation

## 2016-08-24 DIAGNOSIS — J9801 Acute bronchospasm: Secondary | ICD-10-CM

## 2016-08-24 DIAGNOSIS — N186 End stage renal disease: Secondary | ICD-10-CM | POA: Diagnosis not present

## 2016-08-24 DIAGNOSIS — D509 Iron deficiency anemia, unspecified: Secondary | ICD-10-CM | POA: Diagnosis not present

## 2016-08-24 DIAGNOSIS — D631 Anemia in chronic kidney disease: Secondary | ICD-10-CM | POA: Diagnosis not present

## 2016-08-24 DIAGNOSIS — N2581 Secondary hyperparathyroidism of renal origin: Secondary | ICD-10-CM | POA: Diagnosis not present

## 2016-08-24 DIAGNOSIS — R05 Cough: Secondary | ICD-10-CM | POA: Diagnosis not present

## 2016-08-24 MED ORDER — PREDNISONE 50 MG PO TABS: 50.0000 mg | ORAL_TABLET | Freq: Every day | ORAL | 0 refills | Status: DC

## 2016-08-24 MED ORDER — IPRATROPIUM BROMIDE 0.06 % NA SOLN: 2.0000 | Freq: Four times a day (QID) | NASAL | 0 refills | Status: DC

## 2016-08-24 NOTE — Progress Notes (Signed)
Subjective:    Patient ID: Luis Walter, male    DOB: 14-Feb-1951, 66 y.o.   MRN: [id number]  Luis Walter is a 66 y.o. male presenting on 08/24/2016 for Cough  Patient presents for a same day appointment.  HPI  URI / DYSPNEA: Reports symptoms started about 5 days ago, symptoms started in evening with cold weather had nasal and sinus congestion. Seems to be slightly improved but overall persistent congestion and some worsening cough now without significant production of sputum. Additionally wife sick contact at home had similar URI symptoms 2 weeks ago and has not improved yet either. - Tried OTC NyQuil, Alkaseltzer plus - Previously had allergic rhinitis, taking Loratadine nightly and Flonase (had seen Allergist before) - Admits subjective fever with some associated sweats and chills, x 1 vomit (without blood or bile) and brief diarrhea x 1 yesterday (resolved) - Admits to mild dyspnea worsened last night - Denies chest pain, active nausea, vomiting, abdominal pain, ear pain, sore throat, sinus pressure, lightheadedness, headache, dizziness  Complex PMH with ESRD on HD, CAD, Chronic Systolic CHF (last ECHO 123456, EF 25-30%), prior history of pneumonia.  Social History  Substance Use Topics  . Smoking status: Former Research scientist (life sciences)  . Smokeless tobacco: Never Used  . Alcohol use No    Review of Systems Per HPI unless specifically indicated above     Objective:    BP 128/71 (BP Location: Right Arm, Patient Position: Sitting, Cuff Size: Normal)   Pulse 83   Temp 98.1 F (36.7 C) (Oral)   Resp 16   Ht 5\' 8"  (1.727 m)   Wt 179 lb (81.2 kg)   SpO2 97%   BMI 27.22 kg/m   Wt Readings from Last 3 Encounters:  08/24/16 179 lb (81.2 kg)  04/19/16 179 lb 12.8 oz (81.6 kg)  03/27/16 181 lb 12 oz (82.4 kg)    Physical Exam  Constitutional: He appears well-developed and well-nourished. No distress.  Chronically ill appearing and now mildly sick but mostly comfortable cooperative    HENT:  Head: Normocephalic and atraumatic.  Frontal / maxillary sinuses non-tender. Nares patent with significant thin congestion without purulence or edema. Bilateral TMs clear without erythema, effusion or bulging. Oropharynx clear without erythema, exudates, edema or asymmetry.  Neck: Normal range of motion. Neck supple.  Cardiovascular: Normal rate, regular rhythm, normal heart sounds and intact distal pulses.   No murmur heard. Pulmonary/Chest: Effort normal. No respiratory distress. He has wheezes (Scattered end exp wheezes). He has no rales.  Good respiratory effort speaks full sentences. Some transmitted upper airway sounds.  Musculoskeletal: He exhibits no edema.  Lymphadenopathy:    He has no cervical adenopathy.  Neurological: He is alert.  Skin: Skin is warm and dry. He is not diaphoretic.  Psychiatric: His behavior is normal.  Nursing note and vitals reviewed.  I have personally reviewed the radiology report from 08/24/16 Chest X-ray 2 v  CLINICAL DATA:  Cough.  Dyspnea.  EXAM: CHEST  2 VIEW  COMPARISON:  Radiographs of October 20, 2015  FINDINGS: The heart size and mediastinal contours are within normal limits. Left lung is clear. Minimal right basilar subsegmental atelectasis is noted. No pneumothorax or pleural effusion is noted. The visualized skeletal structures are unremarkable.  IMPRESSION: Minimal right basilar subsegmental atelectasis.   Electronically Signed   By: Marijo Conception, M.D.   On: 08/24/2016 14:48   Results for orders placed or performed in visit on 04/19/16  POCT HgB A1C  Result Value Ref Range   Hemoglobin A1C 5.6       Assessment & Plan:   Problem List Items Addressed This Visit    None    Visit Diagnoses    URI with cough and congestion    -  Primary  Consistent with viral URI with some worsening dyspnea and now cough symptoms (>5 days) with sick contact at home wife same symptoms x 2 weeks - Currently afebrile but  reported subjective fever/chills, no focal signs of infection on exam but some bronchospasm with breathing. No volume overload currently seems euvolemic, no chest pain or other concerning symptoms. Wife is concerned with his prior history of pneumonia. Concern with co-morbid systolic CHF, ESRD on HD, CAD  Plan: 1. Reassurance, likely self-limited but understand concerns with his medical history and prior pneumonia, given some bronchospasm will go ahead and check CXR, ordered for [facility] on [address], directions given - UPDATE: reviewed CXR results and images, only minimal R subsegmental atelectasis, without identified infiltrate or other acute problem. [facility] staff to notify patient of these results. No change in therapy, no antibiotics at this time. 2. Start Prednisone 50mg  daily x 5 days 3. Start Atrovent nasal spray decongestant 2 sprays each nostril up to 4 times daily for 5-7 days - Continue anti-histamine Loratadine 10mg  daily, continue Flonase 2 sprays each nostril daily for up to 4-6 weeks - May try OTC Mucinex-DM up to 7-10 days then stop 4. Supportive care with nasal saline, warm herbal tea with honey, 5. Improve hydration 6. Tylenol / Motrin PRN fevers 7. Return criteria given     Relevant Medications   predniSONE (DELTASONE) 50 MG tablet   ipratropium (ATROVENT) 0.06 % nasal spray   Other Relevant Orders   DG Chest 2 View   Cold induced bronchospasm          Meds ordered this encounter  Medications  . predniSONE (DELTASONE) 50 MG tablet    Sig: Take 1 tablet (50 mg total) by mouth daily with breakfast.    Dispense:  5 tablet    Refill:  0  . ipratropium (ATROVENT) 0.06 % nasal spray    Sig: Place 2 sprays into both nostrils 4 (four) times daily. For up to 5-7 days then stop.    Dispense:  15 mL    Refill:  0      Follow up plan: Return in about 2 weeks (around 09/07/2016), or if symptoms worsen or fail to improve, for URI vs  Bronchitis.  Nobie Putnam, [facility] 08/24/2016, 1:55 PM

## 2016-08-24 NOTE — Patient Instructions (Signed)
Thank you for coming in to clinic today.  1. It sounds like you have a Upper Respiratory Virus - this will most likely run it's course in 7 to 10 days. Recommend good hand washing. - Start Prednisone steroid pill 50mg  daily with food for next 5 days - Start Atrovent nasal spray decongestant 2 sprays each nostril up to 4 times daily for 5-7 days - Continue anti-histamine Loratadine Cetirizine 10mg  daily, and use Flonase 2 sprays each nostril daily for up to 4-6 weeks - If congestion is worse, start OTC Mucinex (or may try Mucinex-DM for cough) up to 7-10 days then stop - Drink plenty of fluids to improve congestion - You may try over the counter Nasal Saline spray (Simply Saline, Ocean Spray) as needed to reduce congestion. - Drink warm herbal tea with honey for sore throat - Start taking Tylenol extra strength 1 to 2 tablets every 6-8 hours for aches or fever/chills for next few days as needed. May take Ibuprofen as well if tolerated 200-400mg  every 8 hours as needed.  Chest X-ray ordered.  If symptoms significantly worsening with persistent fevers/chills despite tylenol/ibpurofen, shortness of breath chest pain nausea, vomiting unable to tolerate food/fluids or medicine, body aches, or shortness of breath, sinus pain pressure or worsening productive cough, then follow-up for re-evaluation, may seek more immediate care at Urgent Care or ED if more concerned for emergency.  Please schedule a follow-up appointment with Dr. Parks Ranger in 1-2 week if not improved URI  If you have any other questions or concerns, please feel free to call the clinic or send a message through [place]. You may also schedule an earlier appointment if necessary.  Nobie Putnam, DO [place]

## 2016-08-27 ENCOUNTER — Telehealth: Payer: Self-pay | Admitting: Family Medicine

## 2016-08-27 DIAGNOSIS — D509 Iron deficiency anemia, unspecified: Secondary | ICD-10-CM | POA: Diagnosis not present

## 2016-08-27 DIAGNOSIS — J209 Acute bronchitis, unspecified: Secondary | ICD-10-CM

## 2016-08-27 DIAGNOSIS — N186 End stage renal disease: Secondary | ICD-10-CM | POA: Diagnosis not present

## 2016-08-27 DIAGNOSIS — D631 Anemia in chronic kidney disease: Secondary | ICD-10-CM | POA: Diagnosis not present

## 2016-08-27 DIAGNOSIS — N2581 Secondary hyperparathyroidism of renal origin: Secondary | ICD-10-CM | POA: Diagnosis not present

## 2016-08-27 MED ORDER — AZITHROMYCIN 250 MG PO TABS: ORAL_TABLET | ORAL | 0 refills | Status: DC

## 2016-08-27 NOTE — Telephone Encounter (Signed)
Spouse notified.[place]

## 2016-08-27 NOTE — Telephone Encounter (Signed)
Reviewed chart. Last seen 08/24/16. As discussed if not improving we can try antibiotics with Azithromycin Z-pak, I will go ahead and send this to his [address]). Otherwise, it has only been 3 days, he should still be taking the therapy as prescribed from last visit, still finish Prednisone, still finish using Atrovent nasal spray for 1 week, and symptomatic medicine with Mucinex-DM as advised.  He may follow-up within 7-10 days after taking the antibiotic if overall improved but lingering problem, otherwise, if significant worsening with his medical problems, he may seek more urgent evaluation at hospital if worse.  Nobie Putnam, [facility] 08/27/2016, 12:54 PM

## 2016-08-27 NOTE — Telephone Encounter (Signed)
Pt. Wife called states that pt coughing was not getting any better she states they tried alll the medicine that you  Suggested, wanted to know what she needed to do.  Pt. Call back  # [phone].

## 2016-08-29 DIAGNOSIS — D509 Iron deficiency anemia, unspecified: Secondary | ICD-10-CM | POA: Diagnosis not present

## 2016-08-29 DIAGNOSIS — N186 End stage renal disease: Secondary | ICD-10-CM | POA: Diagnosis not present

## 2016-08-29 DIAGNOSIS — D631 Anemia in chronic kidney disease: Secondary | ICD-10-CM | POA: Diagnosis not present

## 2016-08-29 DIAGNOSIS — N2581 Secondary hyperparathyroidism of renal origin: Secondary | ICD-10-CM | POA: Diagnosis not present

## 2016-08-31 DIAGNOSIS — D509 Iron deficiency anemia, unspecified: Secondary | ICD-10-CM | POA: Diagnosis not present

## 2016-08-31 DIAGNOSIS — N186 End stage renal disease: Secondary | ICD-10-CM | POA: Diagnosis not present

## 2016-08-31 DIAGNOSIS — N2581 Secondary hyperparathyroidism of renal origin: Secondary | ICD-10-CM | POA: Diagnosis not present

## 2016-08-31 DIAGNOSIS — D631 Anemia in chronic kidney disease: Secondary | ICD-10-CM | POA: Diagnosis not present

## 2016-09-03 DIAGNOSIS — D631 Anemia in chronic kidney disease: Secondary | ICD-10-CM | POA: Diagnosis not present

## 2016-09-03 DIAGNOSIS — D509 Iron deficiency anemia, unspecified: Secondary | ICD-10-CM | POA: Diagnosis not present

## 2016-09-03 DIAGNOSIS — Z7682 Awaiting organ transplant status: Secondary | ICD-10-CM | POA: Diagnosis not present

## 2016-09-03 DIAGNOSIS — N2581 Secondary hyperparathyroidism of renal origin: Secondary | ICD-10-CM | POA: Diagnosis not present

## 2016-09-03 DIAGNOSIS — R768 Other specified abnormal immunological findings in serum: Secondary | ICD-10-CM | POA: Diagnosis not present

## 2016-09-03 DIAGNOSIS — N186 End stage renal disease: Secondary | ICD-10-CM | POA: Diagnosis not present

## 2016-09-05 DIAGNOSIS — N2581 Secondary hyperparathyroidism of renal origin: Secondary | ICD-10-CM | POA: Diagnosis not present

## 2016-09-05 DIAGNOSIS — D631 Anemia in chronic kidney disease: Secondary | ICD-10-CM | POA: Diagnosis not present

## 2016-09-05 DIAGNOSIS — N186 End stage renal disease: Secondary | ICD-10-CM | POA: Diagnosis not present

## 2016-09-05 DIAGNOSIS — D509 Iron deficiency anemia, unspecified: Secondary | ICD-10-CM | POA: Diagnosis not present

## 2016-09-07 DIAGNOSIS — N186 End stage renal disease: Secondary | ICD-10-CM | POA: Diagnosis not present

## 2016-09-07 DIAGNOSIS — D509 Iron deficiency anemia, unspecified: Secondary | ICD-10-CM | POA: Diagnosis not present

## 2016-09-07 DIAGNOSIS — D631 Anemia in chronic kidney disease: Secondary | ICD-10-CM | POA: Diagnosis not present

## 2016-09-07 DIAGNOSIS — N2581 Secondary hyperparathyroidism of renal origin: Secondary | ICD-10-CM | POA: Diagnosis not present

## 2016-09-10 DIAGNOSIS — D509 Iron deficiency anemia, unspecified: Secondary | ICD-10-CM | POA: Diagnosis not present

## 2016-09-10 DIAGNOSIS — D631 Anemia in chronic kidney disease: Secondary | ICD-10-CM | POA: Diagnosis not present

## 2016-09-10 DIAGNOSIS — N186 End stage renal disease: Secondary | ICD-10-CM | POA: Diagnosis not present

## 2016-09-10 DIAGNOSIS — N2581 Secondary hyperparathyroidism of renal origin: Secondary | ICD-10-CM | POA: Diagnosis not present

## 2016-09-12 DIAGNOSIS — N186 End stage renal disease: Secondary | ICD-10-CM | POA: Diagnosis not present

## 2016-09-12 DIAGNOSIS — D631 Anemia in chronic kidney disease: Secondary | ICD-10-CM | POA: Diagnosis not present

## 2016-09-12 DIAGNOSIS — N2581 Secondary hyperparathyroidism of renal origin: Secondary | ICD-10-CM | POA: Diagnosis not present

## 2016-09-12 DIAGNOSIS — D509 Iron deficiency anemia, unspecified: Secondary | ICD-10-CM | POA: Diagnosis not present

## 2016-09-14 DIAGNOSIS — N2581 Secondary hyperparathyroidism of renal origin: Secondary | ICD-10-CM | POA: Diagnosis not present

## 2016-09-14 DIAGNOSIS — N186 End stage renal disease: Secondary | ICD-10-CM | POA: Diagnosis not present

## 2016-09-14 DIAGNOSIS — D631 Anemia in chronic kidney disease: Secondary | ICD-10-CM | POA: Diagnosis not present

## 2016-09-14 DIAGNOSIS — D509 Iron deficiency anemia, unspecified: Secondary | ICD-10-CM | POA: Diagnosis not present

## 2016-09-17 DIAGNOSIS — D509 Iron deficiency anemia, unspecified: Secondary | ICD-10-CM | POA: Diagnosis not present

## 2016-09-17 DIAGNOSIS — N186 End stage renal disease: Secondary | ICD-10-CM | POA: Diagnosis not present

## 2016-09-17 DIAGNOSIS — D631 Anemia in chronic kidney disease: Secondary | ICD-10-CM | POA: Diagnosis not present

## 2016-09-17 DIAGNOSIS — N2581 Secondary hyperparathyroidism of renal origin: Secondary | ICD-10-CM | POA: Diagnosis not present

## 2016-09-19 DIAGNOSIS — N186 End stage renal disease: Secondary | ICD-10-CM | POA: Diagnosis not present

## 2016-09-19 DIAGNOSIS — E1122 Type 2 diabetes mellitus with diabetic chronic kidney disease: Secondary | ICD-10-CM | POA: Diagnosis not present

## 2016-09-19 DIAGNOSIS — D509 Iron deficiency anemia, unspecified: Secondary | ICD-10-CM | POA: Diagnosis not present

## 2016-09-19 DIAGNOSIS — N2581 Secondary hyperparathyroidism of renal origin: Secondary | ICD-10-CM | POA: Diagnosis not present

## 2016-09-19 DIAGNOSIS — Z992 Dependence on renal dialysis: Secondary | ICD-10-CM | POA: Diagnosis not present

## 2016-09-19 DIAGNOSIS — D631 Anemia in chronic kidney disease: Secondary | ICD-10-CM | POA: Diagnosis not present

## 2016-09-21 DIAGNOSIS — N2581 Secondary hyperparathyroidism of renal origin: Secondary | ICD-10-CM | POA: Diagnosis not present

## 2016-09-21 DIAGNOSIS — D631 Anemia in chronic kidney disease: Secondary | ICD-10-CM | POA: Diagnosis not present

## 2016-09-21 DIAGNOSIS — N186 End stage renal disease: Secondary | ICD-10-CM | POA: Diagnosis not present

## 2016-09-24 DIAGNOSIS — D631 Anemia in chronic kidney disease: Secondary | ICD-10-CM | POA: Diagnosis not present

## 2016-09-24 DIAGNOSIS — N186 End stage renal disease: Secondary | ICD-10-CM | POA: Diagnosis not present

## 2016-09-24 DIAGNOSIS — N2581 Secondary hyperparathyroidism of renal origin: Secondary | ICD-10-CM | POA: Diagnosis not present

## 2016-09-26 DIAGNOSIS — D631 Anemia in chronic kidney disease: Secondary | ICD-10-CM | POA: Diagnosis not present

## 2016-09-26 DIAGNOSIS — N186 End stage renal disease: Secondary | ICD-10-CM | POA: Diagnosis not present

## 2016-09-26 DIAGNOSIS — N2581 Secondary hyperparathyroidism of renal origin: Secondary | ICD-10-CM | POA: Diagnosis not present

## 2016-09-28 DIAGNOSIS — N186 End stage renal disease: Secondary | ICD-10-CM | POA: Diagnosis not present

## 2016-09-28 DIAGNOSIS — N2581 Secondary hyperparathyroidism of renal origin: Secondary | ICD-10-CM | POA: Diagnosis not present

## 2016-09-28 DIAGNOSIS — D631 Anemia in chronic kidney disease: Secondary | ICD-10-CM | POA: Diagnosis not present

## 2016-10-01 DIAGNOSIS — N2581 Secondary hyperparathyroidism of renal origin: Secondary | ICD-10-CM | POA: Diagnosis not present

## 2016-10-01 DIAGNOSIS — N186 End stage renal disease: Secondary | ICD-10-CM | POA: Diagnosis not present

## 2016-10-01 DIAGNOSIS — D631 Anemia in chronic kidney disease: Secondary | ICD-10-CM | POA: Diagnosis not present

## 2016-10-03 DIAGNOSIS — D631 Anemia in chronic kidney disease: Secondary | ICD-10-CM | POA: Diagnosis not present

## 2016-10-03 DIAGNOSIS — N186 End stage renal disease: Secondary | ICD-10-CM | POA: Diagnosis not present

## 2016-10-03 DIAGNOSIS — N2581 Secondary hyperparathyroidism of renal origin: Secondary | ICD-10-CM | POA: Diagnosis not present

## 2016-10-04 ENCOUNTER — Ambulatory Visit: Payer: Medicare Other | Admitting: Cardiovascular Disease

## 2016-10-05 DIAGNOSIS — N186 End stage renal disease: Secondary | ICD-10-CM | POA: Diagnosis not present

## 2016-10-05 DIAGNOSIS — N2581 Secondary hyperparathyroidism of renal origin: Secondary | ICD-10-CM | POA: Diagnosis not present

## 2016-10-05 DIAGNOSIS — D631 Anemia in chronic kidney disease: Secondary | ICD-10-CM | POA: Diagnosis not present

## 2016-10-08 DIAGNOSIS — N186 End stage renal disease: Secondary | ICD-10-CM | POA: Diagnosis not present

## 2016-10-08 DIAGNOSIS — N2581 Secondary hyperparathyroidism of renal origin: Secondary | ICD-10-CM | POA: Diagnosis not present

## 2016-10-08 DIAGNOSIS — D631 Anemia in chronic kidney disease: Secondary | ICD-10-CM | POA: Diagnosis not present

## 2016-10-09 ENCOUNTER — Ambulatory Visit: Payer: Medicare Other | Admitting: Cardiovascular Disease

## 2016-10-09 DIAGNOSIS — N2581 Secondary hyperparathyroidism of renal origin: Secondary | ICD-10-CM | POA: Diagnosis not present

## 2016-10-09 DIAGNOSIS — N186 End stage renal disease: Secondary | ICD-10-CM | POA: Diagnosis not present

## 2016-10-09 DIAGNOSIS — D631 Anemia in chronic kidney disease: Secondary | ICD-10-CM | POA: Diagnosis not present

## 2016-10-10 DIAGNOSIS — D631 Anemia in chronic kidney disease: Secondary | ICD-10-CM | POA: Diagnosis not present

## 2016-10-10 DIAGNOSIS — N186 End stage renal disease: Secondary | ICD-10-CM | POA: Diagnosis not present

## 2016-10-10 DIAGNOSIS — N2581 Secondary hyperparathyroidism of renal origin: Secondary | ICD-10-CM | POA: Diagnosis not present

## 2016-10-12 DIAGNOSIS — N2581 Secondary hyperparathyroidism of renal origin: Secondary | ICD-10-CM | POA: Diagnosis not present

## 2016-10-12 DIAGNOSIS — D631 Anemia in chronic kidney disease: Secondary | ICD-10-CM | POA: Diagnosis not present

## 2016-10-12 DIAGNOSIS — N186 End stage renal disease: Secondary | ICD-10-CM | POA: Diagnosis not present

## 2016-10-15 DIAGNOSIS — N186 End stage renal disease: Secondary | ICD-10-CM | POA: Diagnosis not present

## 2016-10-15 DIAGNOSIS — N2581 Secondary hyperparathyroidism of renal origin: Secondary | ICD-10-CM | POA: Diagnosis not present

## 2016-10-15 DIAGNOSIS — D631 Anemia in chronic kidney disease: Secondary | ICD-10-CM | POA: Diagnosis not present

## 2016-10-16 ENCOUNTER — Encounter: Payer: Self-pay | Admitting: Cardiovascular Disease

## 2016-10-16 ENCOUNTER — Ambulatory Visit (INDEPENDENT_AMBULATORY_CARE_PROVIDER_SITE_OTHER): Payer: Medicare Other | Admitting: Cardiovascular Disease

## 2016-10-16 VITALS — BP 100/60 | HR 66 | Ht 68.0 in | Wt 183.8 lb

## 2016-10-16 DIAGNOSIS — I2510 Atherosclerotic heart disease of native coronary artery without angina pectoris: Secondary | ICD-10-CM

## 2016-10-16 DIAGNOSIS — E785 Hyperlipidemia, unspecified: Secondary | ICD-10-CM

## 2016-10-16 DIAGNOSIS — I5022 Chronic systolic (congestive) heart failure: Secondary | ICD-10-CM | POA: Diagnosis not present

## 2016-10-16 NOTE — Patient Instructions (Signed)
Medication Instructions: Continue same medications.   Labwork: None.   Procedures/Testing: None.   Follow-Up: 6 months with Dr. Noelie Renfrow.   Any Additional Special Instructions Will Be Listed Below (If Applicable).     If you need a refill on your cardiac medications before your next appointment, please call your pharmacy.   

## 2016-10-16 NOTE — Progress Notes (Signed)
Cardiology Office Note   Date:  10/16/2016   ID:  DREDEN DIBERNARDO, DOB [birth year], MRN [id number]  PCP:  Nobie Putnam, DO  Cardiologist:   Kathlyn Sacramento, MD   Chief Complaint  Patient presents with  . other    6 month follow up. Patient states he is doing well. Meds reviewed verbally with patient.       History of Present Illness: Luis Walter is a 66 y.o. male who presents for A follow-up visit regarding coronary artery disease and chronic systolic heart failure.  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  He had prior multiple hospitalizations due to sepsis but none in the last 6 months. Most recent echocardiogram in August 2017 showed severely reduced LV systolic function with an EF of 25-30%, mild mitral stenosis and moderately dilated left atrium. Have not been able to start most heart failure medications due to hypotension especially during dialysis. He is only on small dose metoprolol. He has been doing reasonably well and denies any chest pain or significant dyspnea.   Past Medical History:  Diagnosis Date  . CAD (coronary artery disease)    a. cardiac cath 2014: LM nl, mLAD 30%, dLAD 30%, ostLCx 50%, mid ramus 20% OM3 80% pRCA 20%, mRCA 20%, PDA 100% w/ left to right collats, medically managed  b.10/2015: NSTEMI   . Chronic systolic CHF (congestive heart failure) ([place])    a. echo 12/2014: EF 35-40%, mod diffuse HK, RWMA cannot be excluded, LV diastolic fxn parameters nl, mild MR, moderately dilated LA, mildly dilated RV internal cavity size, mild TR, PASP nl  . Dialysis patient ([place])   . DM2 (diabetes mellitus, type 2) ([place])   . ESRD (end stage renal disease) ([place])    a. on HD M,W,F; b. previously on transplant list at [facility] - removed 2016  . Gallstones   . Stroke [facility])      Past Surgical History:  Procedure Laterality Date  . AV FISTULA PLACEMENT Left   . COLONOSCOPY WITH PROPOFOL N/A 05/31/2015   Procedure: COLONOSCOPY WITH PROPOFOL;  Surgeon: Lollie Sails, MD;  Location: [facility] ENDOSCOPY;  Service: Endoscopy;  Laterality: N/A;  . ESOPHAGOGASTRODUODENOSCOPY (EGD) WITH PROPOFOL N/A 05/31/2015   Procedure: ESOPHAGOGASTRODUODENOSCOPY (EGD) WITH PROPOFOL;  Surgeon: Lollie Sails, MD;  Location[facility] ENDOSCOPY;  Service: Endoscopy;  Laterality: N/A;     Current Outpatient Prescriptions  Medication Sig Dispense Refill  . aspirin EC 81 MG tablet Take 81 mg by mouth at bedtime.    Marland Kitchen atorvastatin (LIPITOR) 40 MG tablet Take 1 tablet (40 mg total) by mouth daily at 6 PM. 90 tablet 3  . cinacalcet (SENSIPAR) 30 MG tablet Take 30 mg by mouth daily.    . clindamycin (CLEOCIN T) 1 % lotion Apply topically 2 (two) times daily. 60 mL 2  . fluticasone (FLONASE) 50 MCG/ACT nasal spray Place 2 sprays into both nostrils daily. 16 g 11  . ipratropium (ATROVENT) 0.06 % nasal spray Place 2 sprays into both nostrils 4 (four) times daily. For up to 5-7 days then stop. 15 mL 0  . loratadine (CLARITIN) 10 MG tablet Take 1 tablet every 48 hours as needed for allergy symptoms. 30 tablet 11  . metoprolol tartrate (LOPRESSOR) 25 MG tablet Take 0.5 tablets (12.5 mg total) by mouth 2 (  two) times daily. 180 tablet 3  . Nutritional Supplements (FEEDING SUPPLEMENT, NEPRO CARB STEADY,) LIQD Take 237 mLs by mouth 2 (two) times daily between meals. 60 Can 6  . sevelamer carbonate (RENVELA) 800 MG tablet Take 3,200 mg by mouth 3 (three) times daily with meals.     No current facility-administered medications for this visit.     Allergies:   Patient has no known allergies.    Social History:  The patient  reports that he has quit smoking. He has never used smokeless tobacco. He reports that he does not drink alcohol or use drugs.   Family History:  The patient's family history  includes Diabetes in his father and mother; Hypertension in his father.    ROS:  Please see the history of present illness.   Otherwise, review of systems are positive for none.   All other systems are reviewed and negative.    PHYSICAL EXAM: VS:  BP 100/60 (BP Location: Right Arm, Patient Position: Sitting, Cuff Size: Normal)   Pulse 66   Ht 5\' 8"  (1.727 m)   Wt 183 lb 12 oz (83.3 kg)   BMI 27.94 kg/m  , BMI Body mass index is 27.94 kg/m. GEN: Well nourished, well developed, in no acute distress HEENT: normal Neck: no JVD, carotid bruits, or masses Cardiac: RRR; no murmurs, rubs, or gallops,no edema  Respiratory:  clear to auscultation bilaterally, normal work of breathing GI: soft, nontender, nondistended, + BS MS: no deformity or atrophy Skin: warm and dry, no rash Neuro:  Strength and sensation are intact Psych: euthymic mood, full affect   EKG:  EKG is  ordered today. EKG showed sinus rhythm with PACs, anterolateral and inferior ST changes suggestive of ischemia.   Recent Labs: 10/20/2015: ALT 41; B Natriuretic Peptide 4,369.0 10/21/2015: BUN 36; Creatinine, Ser 4.90; Potassium 4.5; Sodium 141 10/23/2015: Hemoglobin 10.2; Platelets 124    Lipid Panel    Component Value Date/Time   CHOL 102 10/21/2015 0805   CHOL 184 05/31/2013 0249   TRIG 88 10/21/2015 0805   TRIG 182 05/31/2013 0249   HDL 29 (L) 10/21/2015 0805   HDL 37 (L) 05/31/2013 0249   CHOLHDL 3.5 10/21/2015 0805   VLDL 18 10/21/2015 0805   VLDL 36 05/31/2013 0249   LDLCALC 55 10/21/2015 0805   LDLCALC 111 (H) 05/31/2013 0249      Wt Readings from Last 3 Encounters:  10/16/16 183 lb 12 oz (83.3 kg)  08/24/16 179 lb (81.2 kg)  04/19/16 179 lb 12.8 oz (81.6 kg)         ASSESSMENT AND PLAN:  1.  Coronary artery disease involving native coronary arteries without angina . He is overall doing reasonably well with no anginal symptoms. Continue medical therapy.  2. Chronic systolic heart failure:  Most  recent ejection fraction was 25-30%. He appears to be euvolemic.  He is not a good candidate for ICD placement given recurrent bacteremia.  Continue small dose metoprolol. Not able to add other heart failure medications due to chronic hypotension.  3. Hyperlipidemia: Continue treatment with atorvastatin with a target LDL of less than 70. Most recent LDL was 55.   Disposition:   FU with me in 6 months  Signed,  Kathlyn Sacramento, MD  10/16/2016 4:10 PM    [facility]

## 2016-10-17 DIAGNOSIS — E1122 Type 2 diabetes mellitus with diabetic chronic kidney disease: Secondary | ICD-10-CM | POA: Diagnosis not present

## 2016-10-17 DIAGNOSIS — N186 End stage renal disease: Secondary | ICD-10-CM | POA: Diagnosis not present

## 2016-10-17 DIAGNOSIS — D631 Anemia in chronic kidney disease: Secondary | ICD-10-CM | POA: Diagnosis not present

## 2016-10-17 DIAGNOSIS — N2581 Secondary hyperparathyroidism of renal origin: Secondary | ICD-10-CM | POA: Diagnosis not present

## 2016-10-17 DIAGNOSIS — Z992 Dependence on renal dialysis: Secondary | ICD-10-CM | POA: Diagnosis not present

## 2016-10-18 ENCOUNTER — Ambulatory Visit: Payer: Medicare Other | Admitting: Family Medicine

## 2016-10-18 ENCOUNTER — Ambulatory Visit (INDEPENDENT_AMBULATORY_CARE_PROVIDER_SITE_OTHER): Payer: Medicare Other | Admitting: Family Medicine

## 2016-10-18 ENCOUNTER — Encounter: Payer: Self-pay | Admitting: Family Medicine

## 2016-10-18 VITALS — BP 126/61 | HR 65 | Temp 97.6°F | Resp 16 | Ht 68.0 in | Wt 184.0 lb

## 2016-10-18 DIAGNOSIS — E1169 Type 2 diabetes mellitus with other specified complication: Secondary | ICD-10-CM | POA: Diagnosis not present

## 2016-10-18 DIAGNOSIS — Z992 Dependence on renal dialysis: Secondary | ICD-10-CM | POA: Diagnosis not present

## 2016-10-18 DIAGNOSIS — L821 Other seborrheic keratosis: Secondary | ICD-10-CM | POA: Diagnosis not present

## 2016-10-18 DIAGNOSIS — I120 Hypertensive chronic kidney disease with stage 5 chronic kidney disease or end stage renal disease: Secondary | ICD-10-CM

## 2016-10-18 DIAGNOSIS — L700 Acne vulgaris: Secondary | ICD-10-CM | POA: Diagnosis not present

## 2016-10-18 DIAGNOSIS — E1122 Type 2 diabetes mellitus with diabetic chronic kidney disease: Secondary | ICD-10-CM | POA: Diagnosis not present

## 2016-10-18 DIAGNOSIS — I2510 Atherosclerotic heart disease of native coronary artery without angina pectoris: Secondary | ICD-10-CM

## 2016-10-18 DIAGNOSIS — N186 End stage renal disease: Secondary | ICD-10-CM | POA: Diagnosis not present

## 2016-10-18 DIAGNOSIS — E785 Hyperlipidemia, unspecified: Secondary | ICD-10-CM | POA: Diagnosis not present

## 2016-10-18 LAB — POCT GLYCOSYLATED HEMOGLOBIN (HGB A1C): HEMOGLOBIN A1C: 6.2

## 2016-10-18 MED ORDER — ADAPALENE 0.1 % EX GEL: Freq: Every day | CUTANEOUS | 1 refills | Status: DC

## 2016-10-18 NOTE — Progress Notes (Signed)
Subjective:    Patient ID: DREAN REMME, male    DOB: 24-Aug-1950, 66 y.o.   MRN: [id number]  Luis Walter is a 66 y.o. male presenting on 10/18/2016 for Diabetes (highest BS 131 and lowest 89)   HPI   CHRONIC DM, Type 2 with Renal Complication with ESRD: Reports prior history dx DM >10 years ago, previously treated with variety of meds including insulin, he controlled diabetes with weight loss and lifestyle, did develop kidney failure, and had to reduce medicines, including sulfonylurea. CBGs: Checks CBGs x3 weekly at HD, avg 100-120s,  Meds: None currently Lifestyle: Diet (tries to follow DM diet, sometimes will eat too many carbs but aware of appropriate diet, drinks water and sugar free lemonade) / Exercise (outdoor activities, yardwork, walking outside when weather is nice) - Followed by [employer] [facility], Dr Bishop [place]) last visit >1 year ago, will schedule new apt soon, and request DM eye report - Asking about Vitamins today, B12 - Admits weight is slightly up today wearing heavier boots due to rain, 184 lbs but avg dry wt is reported 180-181 lbs - Admits he still voids small amount of urine regularly Denies hypoglycemia, polyuria, visual changes, numbness or tingling.  CHRONIC HTN: Reports no concerns, BP is controlled often when checked at HD. Current Meds - Metoprolol 12.5mg  BID Reports good compliance, took meds today. Tolerating well, w/o complaints. Denies CP, dyspnea, HA, edema, dizziness / lightheadedness  HYPERLIPIDEMIA in DM2 - Reports no concerns. Last lipid panel 10/2015, controlled low HDL and LDL, TG - Currently taking atorvastatin 40mg  daily, tolerating well without side effects or myalgias  ACNE on back / Seborrheic Keratoses: - Reports has had some chronic hyperpigmented age spots, but more recently has noticed increased number of "black heads" or pimples on his back. Not attributed to any particular reason. Does not have similar lesions  elsewhere. Face is clear. - Not using any topical treatments  Social History  Substance Use Topics  . Smoking status: Former Research scientist (life sciences)  . Smokeless tobacco: Never Used  . Alcohol use No    Review of Systems Per HPI unless specifically indicated above     Objective:    BP 126/61   Pulse 65   Temp 97.6 F (36.4 C) (Oral)   Resp 16   Ht 5\' 8"  (1.727 m)   Wt 184 lb (83.5 kg)   BMI 27.98 kg/m   Wt Readings from Last 3 Encounters:  10/18/16 184 lb (83.5 kg)  10/16/16 183 lb 12 oz (83.3 kg)  08/24/16 179 lb (81.2 kg)    Physical Exam  Constitutional: He appears well-developed and well-nourished. No distress.  Chronically ill but currently well appearing, comfortable, cooperative  HENT:  Head: Normocephalic and atraumatic.  Mouth/Throat: Oropharynx is clear and moist.  Eyes: Conjunctivae are normal.  Neck: Normal range of motion. Neck supple.  Cardiovascular: Normal rate, regular rhythm, normal heart sounds and intact distal pulses.   No murmur heard. Pulmonary/Chest: Effort normal and breath sounds normal. No respiratory distress. He has no wheezes. He has no rales.  Improved air movement, speaks full sentences. No focal abnormalities  Musculoskeletal: He exhibits no edema or tenderness.  Neurological: He is alert.  Skin: Skin is warm and dry. No rash noted. He is not diaphoretic. No erythema.  Upper back with scattered hyperpigmented nodular appearing acne, without significant pustules. Large number of lesions actually appear more consistent with seborrheic keratoses, some may be inflamed. Non tender, no erythema.  Psychiatric: He  has a normal mood and affect. His behavior is normal.  Nursing note and vitals reviewed.   DM FOOT EXAM - normal appearance except thickened toenails, no lesions, calluses, or ulcers, intact sensation to monofilament bilaterally however some difficulty with localization of sensation.       Results for orders placed or performed in visit on  10/18/16  POCT HgB A1C  Result Value Ref Range   Hemoglobin A1C 6.2       Assessment & Plan:   Problem List Items Addressed This Visit    Type 2 diabetes, controlled, with renal manifestation ([place]) - Primary    Remains well controlled, A1c 6.2 (overall ranging 5.5 to 6s), diet controlled Not on medications for >1-2 years  Plan: 1. No change today, continue off meds - reviewed DM diet 2. Cautioned hypoglycemia protocol 3. On ASA, statin. No ACE/ARB due to ESRD 4. Foot exam done today 5. Advised to schedule annual DM eye with established optometry (Dr Ronne Binning, [place]) request record 6. Check chemistry, lipids 7. Follow-up 3 months, in future can space out to q 6 mo A1c if remains well controlled      Relevant Orders   POCT HgB A1C (Completed)   COMPLETE METABOLIC PANEL WITH GFR   Nodular acne    Localized to upper back mostly. No complicated areas, no active pustules. Suspect some lesions are seborrheic keratoses. - Trial on Differin adapalene gel nightly, if unable to afford or not effective can switch to Bezoyl peroxide type, otherwise may be more SKs and would require cryo in future per Derm if inflamed only      Relevant Medications   adapalene (DIFFERIN) 0.1 % gel   Hyperlipidemia associated with type 2 diabetes mellitus (HCC)    Stable, controlled Last lipids 10/21/15 Continue atorvastatin 40mg  daily, since already on this prior to ESRD Check fasting lipids 1 week, follow-up results      Relevant Orders   Lipid panel   ESRD on dialysis ([place])    Followed by Nephrology, on HD MWF Concern with anemia in setting of ESRD, patient to discuss with Nephrology Check vitamin D and B12 by request today      Relevant Orders   VITAMIN D 25 Hydroxy (Vit-D Deficiency, Fractures)   Vitamin B12   Benign hypertension with ESRD (end-stage renal disease) ([place])    Stable, currently Followed by Cardiology / Nephrology, on HD MWF for ESRD (no ACE/ARB) Continue Metoprolol  12.5mg  BID, awaiting refill request from [facility] for 90 day supply Check future labs, chemistry, follow-up      Relevant Orders   COMPLETE METABOLIC PANEL WITH GFR    Other Visit Diagnoses    Seborrheic keratoses       on upper back, treating some lesions as acne first to see response      Meds ordered this encounter  Medications  . adapalene (DIFFERIN) 0.1 % gel    Sig: Apply topically at bedtime.    Dispense:  45 g    Refill:  1      Follow up plan: Return in about 3 months (around 01/18/2017) for diabetes.   Nobie Putnam, [facility] 10/18/2016, 9:51 AM

## 2016-10-18 NOTE — Assessment & Plan Note (Signed)
Followed by Nephrology, on HD MWF Concern with anemia in setting of ESRD, patient to discuss with Nephrology Check vitamin D and B12 by request today

## 2016-10-18 NOTE — Assessment & Plan Note (Signed)
Stable, currently Followed by Cardiology / Nephrology, on HD MWF for ESRD (no ACE/ARB) Continue Metoprolol 12.5mg  BID, awaiting refill request from [facility] for 90 day supply Check future labs, chemistry, follow-up

## 2016-10-18 NOTE — Patient Instructions (Addendum)
Thank you for coming in to clinic today.  1. Diabetes is doing well, A1c 6.2 Keep up the good work  No medication changes today  Discuss Anemia with Kidney Specialist (eating more ice)  Waiting on refill request from [facility] for 90 day supply  You will be due for [place] (no food or drink after midnight before, only water or coffee without cream/sugar on the morning of)  - Please go ahead and schedule a "Lab Only" visit in the morning at the clinic for lab draw in 1-3 weeks when preferred - Make sure Lab Only appointment is at least 1-2 weeks before your next appointment, so that results will be available  For Lab Results, once available within 2-3 days of blood draw, you can can log in to [place] online to view your results and a brief explanation. Also, we can discuss results at next follow-up visit.  Please schedule a follow-up appointment with Dr. Parks Ranger in 3 months for Diabetes A1c  If you have any other questions or concerns, please feel free to call the clinic or send a message through [place]. You may also schedule an earlier appointment if necessary.  Nobie Putnam, DO [place]

## 2016-10-18 NOTE — Assessment & Plan Note (Signed)
Remains well controlled, A1c 6.2 (overall ranging 5.5 to 6s), diet controlled Not on medications for >1-2 years  Plan: 1. No change today, continue off meds - reviewed DM diet 2. Cautioned hypoglycemia protocol 3. On ASA, statin. No ACE/ARB due to ESRD 4. Foot exam done today 5. Advised to schedule annual DM eye with established optometry (Dr Ronne Binning, [place]) request record 6. Check chemistry, lipids 7. Follow-up 3 months, in future can space out to q 6 mo A1c if remains well controlled

## 2016-10-18 NOTE — Assessment & Plan Note (Signed)
Stable, controlled Last lipids 10/21/15 Continue atorvastatin 40mg  daily, since already on this prior to ESRD Check fasting lipids 1 week, follow-up results

## 2016-10-18 NOTE — Assessment & Plan Note (Signed)
Localized to upper back mostly. No complicated areas, no active pustules. Suspect some lesions are seborrheic keratoses. - Trial on Differin adapalene gel nightly, if unable to afford or not effective can switch to Bezoyl peroxide type, otherwise may be more SKs and would require cryo in future per Derm if inflamed only

## 2016-10-19 DIAGNOSIS — N186 End stage renal disease: Secondary | ICD-10-CM | POA: Diagnosis not present

## 2016-10-19 DIAGNOSIS — N2581 Secondary hyperparathyroidism of renal origin: Secondary | ICD-10-CM | POA: Diagnosis not present

## 2016-10-19 DIAGNOSIS — D631 Anemia in chronic kidney disease: Secondary | ICD-10-CM | POA: Diagnosis not present

## 2016-10-19 DIAGNOSIS — E162 Hypoglycemia, unspecified: Secondary | ICD-10-CM | POA: Diagnosis not present

## 2016-10-22 DIAGNOSIS — N186 End stage renal disease: Secondary | ICD-10-CM | POA: Diagnosis not present

## 2016-10-22 DIAGNOSIS — D631 Anemia in chronic kidney disease: Secondary | ICD-10-CM | POA: Diagnosis not present

## 2016-10-22 DIAGNOSIS — E162 Hypoglycemia, unspecified: Secondary | ICD-10-CM | POA: Diagnosis not present

## 2016-10-22 DIAGNOSIS — N2581 Secondary hyperparathyroidism of renal origin: Secondary | ICD-10-CM | POA: Diagnosis not present

## 2016-10-23 ENCOUNTER — Other Ambulatory Visit: Payer: Medicare Other

## 2016-10-24 DIAGNOSIS — E1129 Type 2 diabetes mellitus with other diabetic kidney complication: Secondary | ICD-10-CM | POA: Diagnosis not present

## 2016-10-24 DIAGNOSIS — N2581 Secondary hyperparathyroidism of renal origin: Secondary | ICD-10-CM | POA: Diagnosis not present

## 2016-10-24 DIAGNOSIS — E162 Hypoglycemia, unspecified: Secondary | ICD-10-CM | POA: Diagnosis not present

## 2016-10-24 DIAGNOSIS — D631 Anemia in chronic kidney disease: Secondary | ICD-10-CM | POA: Diagnosis not present

## 2016-10-24 DIAGNOSIS — N186 End stage renal disease: Secondary | ICD-10-CM | POA: Diagnosis not present

## 2016-10-25 ENCOUNTER — Other Ambulatory Visit: Payer: Medicare Other

## 2016-10-25 DIAGNOSIS — E1122 Type 2 diabetes mellitus with diabetic chronic kidney disease: Secondary | ICD-10-CM | POA: Diagnosis not present

## 2016-10-25 DIAGNOSIS — E1169 Type 2 diabetes mellitus with other specified complication: Secondary | ICD-10-CM | POA: Diagnosis not present

## 2016-10-25 DIAGNOSIS — N186 End stage renal disease: Secondary | ICD-10-CM | POA: Diagnosis not present

## 2016-10-25 DIAGNOSIS — Z992 Dependence on renal dialysis: Secondary | ICD-10-CM | POA: Diagnosis not present

## 2016-10-25 DIAGNOSIS — E785 Hyperlipidemia, unspecified: Secondary | ICD-10-CM | POA: Diagnosis not present

## 2016-10-25 DIAGNOSIS — I120 Hypertensive chronic kidney disease with stage 5 chronic kidney disease or end stage renal disease: Secondary | ICD-10-CM | POA: Diagnosis not present

## 2016-10-25 LAB — LIPID PANEL
CHOL/HDL RATIO: 1.7 ratio (ref <5.0)
CHOLESTEROL: 110 mg/dL (ref <200)
HDL: 66 mg/dL (ref >40)
LDL Cholesterol: 34 mg/dL (ref <100)
Triglycerides: 52 mg/dL (ref <150)
VLDL: 10 mg/dL (ref <30)

## 2016-10-25 LAB — COMPLETE METABOLIC PANEL WITH GFR
ALBUMIN: 4 g/dL (ref 3.6–5.1)
ALK PHOS: 163 U/L — AB (ref 40–115)
ALT: 12 U/L (ref 9–46)
AST: 14 U/L (ref 10–35)
BILIRUBIN TOTAL: 0.8 mg/dL (ref 0.2–1.2)
BUN: 28 mg/dL — ABNORMAL HIGH (ref 7–25)
CO2: 31 mmol/L (ref 20–31)
Calcium: 9.4 mg/dL (ref 8.6–10.3)
Chloride: 94 mmol/L — ABNORMAL LOW (ref 98–110)
Creat: 5.77 mg/dL — ABNORMAL HIGH (ref 0.70–1.25)
GFR, EST AFRICAN AMERICAN: 11 mL/min — AB (ref >=60)
GFR, EST NON AFRICAN AMERICAN: 10 mL/min — AB (ref >=60)
Glucose, Bld: 126 mg/dL — ABNORMAL HIGH (ref 65–99)
POTASSIUM: 4 mmol/L (ref 3.5–5.3)
Sodium: 139 mmol/L (ref 135–146)
TOTAL PROTEIN: 6.6 g/dL (ref 6.1–8.1)

## 2016-10-25 LAB — VITAMIN B12: VITAMIN B 12: 794 pg/mL (ref 200–1100)

## 2016-10-26 DIAGNOSIS — E162 Hypoglycemia, unspecified: Secondary | ICD-10-CM | POA: Diagnosis not present

## 2016-10-26 DIAGNOSIS — D631 Anemia in chronic kidney disease: Secondary | ICD-10-CM | POA: Diagnosis not present

## 2016-10-26 DIAGNOSIS — N186 End stage renal disease: Secondary | ICD-10-CM | POA: Diagnosis not present

## 2016-10-26 DIAGNOSIS — N2581 Secondary hyperparathyroidism of renal origin: Secondary | ICD-10-CM | POA: Diagnosis not present

## 2016-10-26 LAB — VITAMIN D 25 HYDROXY (VIT D DEFICIENCY, FRACTURES): Vit D, 25-Hydroxy: 21 ng/mL — ABNORMAL LOW (ref 30–100)

## 2016-10-29 DIAGNOSIS — N186 End stage renal disease: Secondary | ICD-10-CM | POA: Diagnosis not present

## 2016-10-29 DIAGNOSIS — D631 Anemia in chronic kidney disease: Secondary | ICD-10-CM | POA: Diagnosis not present

## 2016-10-29 DIAGNOSIS — N2581 Secondary hyperparathyroidism of renal origin: Secondary | ICD-10-CM | POA: Diagnosis not present

## 2016-10-29 DIAGNOSIS — E162 Hypoglycemia, unspecified: Secondary | ICD-10-CM | POA: Diagnosis not present

## 2016-10-31 DIAGNOSIS — E162 Hypoglycemia, unspecified: Secondary | ICD-10-CM | POA: Diagnosis not present

## 2016-10-31 DIAGNOSIS — N2581 Secondary hyperparathyroidism of renal origin: Secondary | ICD-10-CM | POA: Diagnosis not present

## 2016-10-31 DIAGNOSIS — N186 End stage renal disease: Secondary | ICD-10-CM | POA: Diagnosis not present

## 2016-10-31 DIAGNOSIS — D631 Anemia in chronic kidney disease: Secondary | ICD-10-CM | POA: Diagnosis not present

## 2016-11-02 DIAGNOSIS — E162 Hypoglycemia, unspecified: Secondary | ICD-10-CM | POA: Diagnosis not present

## 2016-11-02 DIAGNOSIS — N186 End stage renal disease: Secondary | ICD-10-CM | POA: Diagnosis not present

## 2016-11-02 DIAGNOSIS — N2581 Secondary hyperparathyroidism of renal origin: Secondary | ICD-10-CM | POA: Diagnosis not present

## 2016-11-02 DIAGNOSIS — D631 Anemia in chronic kidney disease: Secondary | ICD-10-CM | POA: Diagnosis not present

## 2016-11-05 DIAGNOSIS — N186 End stage renal disease: Secondary | ICD-10-CM | POA: Diagnosis not present

## 2016-11-05 DIAGNOSIS — D631 Anemia in chronic kidney disease: Secondary | ICD-10-CM | POA: Diagnosis not present

## 2016-11-05 DIAGNOSIS — N2581 Secondary hyperparathyroidism of renal origin: Secondary | ICD-10-CM | POA: Diagnosis not present

## 2016-11-05 DIAGNOSIS — E162 Hypoglycemia, unspecified: Secondary | ICD-10-CM | POA: Diagnosis not present

## 2016-11-07 DIAGNOSIS — N2581 Secondary hyperparathyroidism of renal origin: Secondary | ICD-10-CM | POA: Diagnosis not present

## 2016-11-07 DIAGNOSIS — D631 Anemia in chronic kidney disease: Secondary | ICD-10-CM | POA: Diagnosis not present

## 2016-11-07 DIAGNOSIS — N186 End stage renal disease: Secondary | ICD-10-CM | POA: Diagnosis not present

## 2016-11-07 DIAGNOSIS — E162 Hypoglycemia, unspecified: Secondary | ICD-10-CM | POA: Diagnosis not present

## 2016-11-09 DIAGNOSIS — E162 Hypoglycemia, unspecified: Secondary | ICD-10-CM | POA: Diagnosis not present

## 2016-11-09 DIAGNOSIS — D631 Anemia in chronic kidney disease: Secondary | ICD-10-CM | POA: Diagnosis not present

## 2016-11-09 DIAGNOSIS — N186 End stage renal disease: Secondary | ICD-10-CM | POA: Diagnosis not present

## 2016-11-09 DIAGNOSIS — N2581 Secondary hyperparathyroidism of renal origin: Secondary | ICD-10-CM | POA: Diagnosis not present

## 2016-11-12 DIAGNOSIS — N186 End stage renal disease: Secondary | ICD-10-CM | POA: Diagnosis not present

## 2016-11-12 DIAGNOSIS — H02844 Edema of left upper eyelid: Secondary | ICD-10-CM | POA: Diagnosis not present

## 2016-11-12 DIAGNOSIS — Z79899 Other long term (current) drug therapy: Secondary | ICD-10-CM | POA: Diagnosis not present

## 2016-11-12 DIAGNOSIS — N2581 Secondary hyperparathyroidism of renal origin: Secondary | ICD-10-CM | POA: Diagnosis not present

## 2016-11-12 DIAGNOSIS — H02845 Edema of left lower eyelid: Secondary | ICD-10-CM | POA: Diagnosis not present

## 2016-11-12 DIAGNOSIS — L720 Epidermal cyst: Secondary | ICD-10-CM | POA: Diagnosis not present

## 2016-11-12 DIAGNOSIS — E162 Hypoglycemia, unspecified: Secondary | ICD-10-CM | POA: Diagnosis not present

## 2016-11-12 DIAGNOSIS — R609 Edema, unspecified: Secondary | ICD-10-CM | POA: Diagnosis not present

## 2016-11-12 DIAGNOSIS — D631 Anemia in chronic kidney disease: Secondary | ICD-10-CM | POA: Diagnosis not present

## 2016-11-14 DIAGNOSIS — L720 Epidermal cyst: Secondary | ICD-10-CM | POA: Diagnosis not present

## 2016-11-14 DIAGNOSIS — D631 Anemia in chronic kidney disease: Secondary | ICD-10-CM | POA: Diagnosis not present

## 2016-11-14 DIAGNOSIS — E162 Hypoglycemia, unspecified: Secondary | ICD-10-CM | POA: Diagnosis not present

## 2016-11-14 DIAGNOSIS — N2581 Secondary hyperparathyroidism of renal origin: Secondary | ICD-10-CM | POA: Diagnosis not present

## 2016-11-14 DIAGNOSIS — N186 End stage renal disease: Secondary | ICD-10-CM | POA: Diagnosis not present

## 2016-11-14 DIAGNOSIS — L0390 Cellulitis, unspecified: Secondary | ICD-10-CM | POA: Diagnosis not present

## 2016-11-16 DIAGNOSIS — N2581 Secondary hyperparathyroidism of renal origin: Secondary | ICD-10-CM | POA: Diagnosis not present

## 2016-11-16 DIAGNOSIS — D631 Anemia in chronic kidney disease: Secondary | ICD-10-CM | POA: Diagnosis not present

## 2016-11-16 DIAGNOSIS — E162 Hypoglycemia, unspecified: Secondary | ICD-10-CM | POA: Diagnosis not present

## 2016-11-16 DIAGNOSIS — N186 End stage renal disease: Secondary | ICD-10-CM | POA: Diagnosis not present

## 2016-11-17 DIAGNOSIS — E1122 Type 2 diabetes mellitus with diabetic chronic kidney disease: Secondary | ICD-10-CM | POA: Diagnosis not present

## 2016-11-17 DIAGNOSIS — Z992 Dependence on renal dialysis: Secondary | ICD-10-CM | POA: Diagnosis not present

## 2016-11-17 DIAGNOSIS — N186 End stage renal disease: Secondary | ICD-10-CM | POA: Diagnosis not present

## 2016-11-19 DIAGNOSIS — E162 Hypoglycemia, unspecified: Secondary | ICD-10-CM | POA: Diagnosis not present

## 2016-11-19 DIAGNOSIS — N186 End stage renal disease: Secondary | ICD-10-CM | POA: Diagnosis not present

## 2016-11-19 DIAGNOSIS — D631 Anemia in chronic kidney disease: Secondary | ICD-10-CM | POA: Diagnosis not present

## 2016-11-19 DIAGNOSIS — N2581 Secondary hyperparathyroidism of renal origin: Secondary | ICD-10-CM | POA: Diagnosis not present

## 2016-11-21 DIAGNOSIS — D631 Anemia in chronic kidney disease: Secondary | ICD-10-CM | POA: Diagnosis not present

## 2016-11-21 DIAGNOSIS — N2581 Secondary hyperparathyroidism of renal origin: Secondary | ICD-10-CM | POA: Diagnosis not present

## 2016-11-21 DIAGNOSIS — N186 End stage renal disease: Secondary | ICD-10-CM | POA: Diagnosis not present

## 2016-11-21 DIAGNOSIS — E162 Hypoglycemia, unspecified: Secondary | ICD-10-CM | POA: Diagnosis not present

## 2016-11-23 DIAGNOSIS — N186 End stage renal disease: Secondary | ICD-10-CM | POA: Diagnosis not present

## 2016-11-23 DIAGNOSIS — N2581 Secondary hyperparathyroidism of renal origin: Secondary | ICD-10-CM | POA: Diagnosis not present

## 2016-11-23 DIAGNOSIS — D631 Anemia in chronic kidney disease: Secondary | ICD-10-CM | POA: Diagnosis not present

## 2016-11-23 DIAGNOSIS — E162 Hypoglycemia, unspecified: Secondary | ICD-10-CM | POA: Diagnosis not present

## 2016-11-26 DIAGNOSIS — E162 Hypoglycemia, unspecified: Secondary | ICD-10-CM | POA: Diagnosis not present

## 2016-11-26 DIAGNOSIS — N186 End stage renal disease: Secondary | ICD-10-CM | POA: Diagnosis not present

## 2016-11-26 DIAGNOSIS — N2581 Secondary hyperparathyroidism of renal origin: Secondary | ICD-10-CM | POA: Diagnosis not present

## 2016-11-26 DIAGNOSIS — D631 Anemia in chronic kidney disease: Secondary | ICD-10-CM | POA: Diagnosis not present

## 2016-11-28 DIAGNOSIS — N2581 Secondary hyperparathyroidism of renal origin: Secondary | ICD-10-CM | POA: Diagnosis not present

## 2016-11-28 DIAGNOSIS — N186 End stage renal disease: Secondary | ICD-10-CM | POA: Diagnosis not present

## 2016-11-28 DIAGNOSIS — D631 Anemia in chronic kidney disease: Secondary | ICD-10-CM | POA: Diagnosis not present

## 2016-11-28 DIAGNOSIS — E162 Hypoglycemia, unspecified: Secondary | ICD-10-CM | POA: Diagnosis not present

## 2016-12-03 DIAGNOSIS — N2581 Secondary hyperparathyroidism of renal origin: Secondary | ICD-10-CM | POA: Diagnosis not present

## 2016-12-03 DIAGNOSIS — E162 Hypoglycemia, unspecified: Secondary | ICD-10-CM | POA: Diagnosis not present

## 2016-12-03 DIAGNOSIS — D631 Anemia in chronic kidney disease: Secondary | ICD-10-CM | POA: Diagnosis not present

## 2016-12-03 DIAGNOSIS — N186 End stage renal disease: Secondary | ICD-10-CM | POA: Diagnosis not present

## 2016-12-05 DIAGNOSIS — N2581 Secondary hyperparathyroidism of renal origin: Secondary | ICD-10-CM | POA: Diagnosis not present

## 2016-12-05 DIAGNOSIS — E162 Hypoglycemia, unspecified: Secondary | ICD-10-CM | POA: Diagnosis not present

## 2016-12-05 DIAGNOSIS — D631 Anemia in chronic kidney disease: Secondary | ICD-10-CM | POA: Diagnosis not present

## 2016-12-05 DIAGNOSIS — N186 End stage renal disease: Secondary | ICD-10-CM | POA: Diagnosis not present

## 2016-12-07 DIAGNOSIS — E162 Hypoglycemia, unspecified: Secondary | ICD-10-CM | POA: Diagnosis not present

## 2016-12-07 DIAGNOSIS — N2581 Secondary hyperparathyroidism of renal origin: Secondary | ICD-10-CM | POA: Diagnosis not present

## 2016-12-07 DIAGNOSIS — D631 Anemia in chronic kidney disease: Secondary | ICD-10-CM | POA: Diagnosis not present

## 2016-12-07 DIAGNOSIS — N186 End stage renal disease: Secondary | ICD-10-CM | POA: Diagnosis not present

## 2016-12-10 DIAGNOSIS — D631 Anemia in chronic kidney disease: Secondary | ICD-10-CM | POA: Diagnosis not present

## 2016-12-10 DIAGNOSIS — N2581 Secondary hyperparathyroidism of renal origin: Secondary | ICD-10-CM | POA: Diagnosis not present

## 2016-12-10 DIAGNOSIS — E162 Hypoglycemia, unspecified: Secondary | ICD-10-CM | POA: Diagnosis not present

## 2016-12-10 DIAGNOSIS — N186 End stage renal disease: Secondary | ICD-10-CM | POA: Diagnosis not present

## 2016-12-12 DIAGNOSIS — N186 End stage renal disease: Secondary | ICD-10-CM | POA: Diagnosis not present

## 2016-12-12 DIAGNOSIS — N2581 Secondary hyperparathyroidism of renal origin: Secondary | ICD-10-CM | POA: Diagnosis not present

## 2016-12-12 DIAGNOSIS — E162 Hypoglycemia, unspecified: Secondary | ICD-10-CM | POA: Diagnosis not present

## 2016-12-12 DIAGNOSIS — D631 Anemia in chronic kidney disease: Secondary | ICD-10-CM | POA: Diagnosis not present

## 2016-12-14 DIAGNOSIS — N2581 Secondary hyperparathyroidism of renal origin: Secondary | ICD-10-CM | POA: Diagnosis not present

## 2016-12-14 DIAGNOSIS — E162 Hypoglycemia, unspecified: Secondary | ICD-10-CM | POA: Diagnosis not present

## 2016-12-14 DIAGNOSIS — N186 End stage renal disease: Secondary | ICD-10-CM | POA: Diagnosis not present

## 2016-12-14 DIAGNOSIS — D631 Anemia in chronic kidney disease: Secondary | ICD-10-CM | POA: Diagnosis not present

## 2016-12-17 DIAGNOSIS — D631 Anemia in chronic kidney disease: Secondary | ICD-10-CM | POA: Diagnosis not present

## 2016-12-17 DIAGNOSIS — E1122 Type 2 diabetes mellitus with diabetic chronic kidney disease: Secondary | ICD-10-CM | POA: Diagnosis not present

## 2016-12-17 DIAGNOSIS — Z992 Dependence on renal dialysis: Secondary | ICD-10-CM | POA: Diagnosis not present

## 2016-12-17 DIAGNOSIS — E162 Hypoglycemia, unspecified: Secondary | ICD-10-CM | POA: Diagnosis not present

## 2016-12-17 DIAGNOSIS — N186 End stage renal disease: Secondary | ICD-10-CM | POA: Diagnosis not present

## 2016-12-17 DIAGNOSIS — N2581 Secondary hyperparathyroidism of renal origin: Secondary | ICD-10-CM | POA: Diagnosis not present

## 2016-12-19 DIAGNOSIS — N186 End stage renal disease: Secondary | ICD-10-CM | POA: Diagnosis not present

## 2016-12-19 DIAGNOSIS — D631 Anemia in chronic kidney disease: Secondary | ICD-10-CM | POA: Diagnosis not present

## 2016-12-19 DIAGNOSIS — N2581 Secondary hyperparathyroidism of renal origin: Secondary | ICD-10-CM | POA: Diagnosis not present

## 2016-12-19 DIAGNOSIS — E162 Hypoglycemia, unspecified: Secondary | ICD-10-CM | POA: Diagnosis not present

## 2016-12-21 DIAGNOSIS — D631 Anemia in chronic kidney disease: Secondary | ICD-10-CM | POA: Diagnosis not present

## 2016-12-21 DIAGNOSIS — E162 Hypoglycemia, unspecified: Secondary | ICD-10-CM | POA: Diagnosis not present

## 2016-12-21 DIAGNOSIS — N186 End stage renal disease: Secondary | ICD-10-CM | POA: Diagnosis not present

## 2016-12-21 DIAGNOSIS — N2581 Secondary hyperparathyroidism of renal origin: Secondary | ICD-10-CM | POA: Diagnosis not present

## 2016-12-24 DIAGNOSIS — N2581 Secondary hyperparathyroidism of renal origin: Secondary | ICD-10-CM | POA: Diagnosis not present

## 2016-12-24 DIAGNOSIS — E162 Hypoglycemia, unspecified: Secondary | ICD-10-CM | POA: Diagnosis not present

## 2016-12-24 DIAGNOSIS — D631 Anemia in chronic kidney disease: Secondary | ICD-10-CM | POA: Diagnosis not present

## 2016-12-24 DIAGNOSIS — N186 End stage renal disease: Secondary | ICD-10-CM | POA: Diagnosis not present

## 2016-12-26 DIAGNOSIS — D631 Anemia in chronic kidney disease: Secondary | ICD-10-CM | POA: Diagnosis not present

## 2016-12-26 DIAGNOSIS — E162 Hypoglycemia, unspecified: Secondary | ICD-10-CM | POA: Diagnosis not present

## 2016-12-26 DIAGNOSIS — N186 End stage renal disease: Secondary | ICD-10-CM | POA: Diagnosis not present

## 2016-12-26 DIAGNOSIS — N2581 Secondary hyperparathyroidism of renal origin: Secondary | ICD-10-CM | POA: Diagnosis not present

## 2016-12-28 DIAGNOSIS — N186 End stage renal disease: Secondary | ICD-10-CM | POA: Diagnosis not present

## 2016-12-28 DIAGNOSIS — E162 Hypoglycemia, unspecified: Secondary | ICD-10-CM | POA: Diagnosis not present

## 2016-12-28 DIAGNOSIS — D631 Anemia in chronic kidney disease: Secondary | ICD-10-CM | POA: Diagnosis not present

## 2016-12-28 DIAGNOSIS — N2581 Secondary hyperparathyroidism of renal origin: Secondary | ICD-10-CM | POA: Diagnosis not present

## 2016-12-31 DIAGNOSIS — E162 Hypoglycemia, unspecified: Secondary | ICD-10-CM | POA: Diagnosis not present

## 2016-12-31 DIAGNOSIS — D631 Anemia in chronic kidney disease: Secondary | ICD-10-CM | POA: Diagnosis not present

## 2016-12-31 DIAGNOSIS — N186 End stage renal disease: Secondary | ICD-10-CM | POA: Diagnosis not present

## 2016-12-31 DIAGNOSIS — N2581 Secondary hyperparathyroidism of renal origin: Secondary | ICD-10-CM | POA: Diagnosis not present

## 2017-01-02 DIAGNOSIS — N186 End stage renal disease: Secondary | ICD-10-CM | POA: Diagnosis not present

## 2017-01-02 DIAGNOSIS — E162 Hypoglycemia, unspecified: Secondary | ICD-10-CM | POA: Diagnosis not present

## 2017-01-02 DIAGNOSIS — N2581 Secondary hyperparathyroidism of renal origin: Secondary | ICD-10-CM | POA: Diagnosis not present

## 2017-01-02 DIAGNOSIS — D631 Anemia in chronic kidney disease: Secondary | ICD-10-CM | POA: Diagnosis not present

## 2017-01-04 DIAGNOSIS — N2581 Secondary hyperparathyroidism of renal origin: Secondary | ICD-10-CM | POA: Diagnosis not present

## 2017-01-04 DIAGNOSIS — D631 Anemia in chronic kidney disease: Secondary | ICD-10-CM | POA: Diagnosis not present

## 2017-01-04 DIAGNOSIS — N186 End stage renal disease: Secondary | ICD-10-CM | POA: Diagnosis not present

## 2017-01-04 DIAGNOSIS — E162 Hypoglycemia, unspecified: Secondary | ICD-10-CM | POA: Diagnosis not present

## 2017-01-07 DIAGNOSIS — N2581 Secondary hyperparathyroidism of renal origin: Secondary | ICD-10-CM | POA: Diagnosis not present

## 2017-01-07 DIAGNOSIS — D631 Anemia in chronic kidney disease: Secondary | ICD-10-CM | POA: Diagnosis not present

## 2017-01-07 DIAGNOSIS — E162 Hypoglycemia, unspecified: Secondary | ICD-10-CM | POA: Diagnosis not present

## 2017-01-07 DIAGNOSIS — N186 End stage renal disease: Secondary | ICD-10-CM | POA: Diagnosis not present

## 2017-01-09 DIAGNOSIS — N2581 Secondary hyperparathyroidism of renal origin: Secondary | ICD-10-CM | POA: Diagnosis not present

## 2017-01-09 DIAGNOSIS — E162 Hypoglycemia, unspecified: Secondary | ICD-10-CM | POA: Diagnosis not present

## 2017-01-09 DIAGNOSIS — D631 Anemia in chronic kidney disease: Secondary | ICD-10-CM | POA: Diagnosis not present

## 2017-01-09 DIAGNOSIS — N186 End stage renal disease: Secondary | ICD-10-CM | POA: Diagnosis not present

## 2017-01-11 DIAGNOSIS — N186 End stage renal disease: Secondary | ICD-10-CM | POA: Diagnosis not present

## 2017-01-11 DIAGNOSIS — N2581 Secondary hyperparathyroidism of renal origin: Secondary | ICD-10-CM | POA: Diagnosis not present

## 2017-01-11 DIAGNOSIS — D631 Anemia in chronic kidney disease: Secondary | ICD-10-CM | POA: Diagnosis not present

## 2017-01-11 DIAGNOSIS — E162 Hypoglycemia, unspecified: Secondary | ICD-10-CM | POA: Diagnosis not present

## 2017-01-14 DIAGNOSIS — N186 End stage renal disease: Secondary | ICD-10-CM | POA: Diagnosis not present

## 2017-01-14 DIAGNOSIS — N2581 Secondary hyperparathyroidism of renal origin: Secondary | ICD-10-CM | POA: Diagnosis not present

## 2017-01-14 DIAGNOSIS — D631 Anemia in chronic kidney disease: Secondary | ICD-10-CM | POA: Diagnosis not present

## 2017-01-14 DIAGNOSIS — E162 Hypoglycemia, unspecified: Secondary | ICD-10-CM | POA: Diagnosis not present

## 2017-01-16 DIAGNOSIS — N186 End stage renal disease: Secondary | ICD-10-CM | POA: Diagnosis not present

## 2017-01-16 DIAGNOSIS — N2581 Secondary hyperparathyroidism of renal origin: Secondary | ICD-10-CM | POA: Diagnosis not present

## 2017-01-16 DIAGNOSIS — E162 Hypoglycemia, unspecified: Secondary | ICD-10-CM | POA: Diagnosis not present

## 2017-01-16 DIAGNOSIS — D631 Anemia in chronic kidney disease: Secondary | ICD-10-CM | POA: Diagnosis not present

## 2017-01-17 DIAGNOSIS — N186 End stage renal disease: Secondary | ICD-10-CM | POA: Diagnosis not present

## 2017-01-17 DIAGNOSIS — Z992 Dependence on renal dialysis: Secondary | ICD-10-CM | POA: Diagnosis not present

## 2017-01-17 DIAGNOSIS — E1122 Type 2 diabetes mellitus with diabetic chronic kidney disease: Secondary | ICD-10-CM | POA: Diagnosis not present

## 2017-01-18 DIAGNOSIS — E162 Hypoglycemia, unspecified: Secondary | ICD-10-CM | POA: Diagnosis not present

## 2017-01-18 DIAGNOSIS — D631 Anemia in chronic kidney disease: Secondary | ICD-10-CM | POA: Diagnosis not present

## 2017-01-18 DIAGNOSIS — N2581 Secondary hyperparathyroidism of renal origin: Secondary | ICD-10-CM | POA: Diagnosis not present

## 2017-01-18 DIAGNOSIS — N186 End stage renal disease: Secondary | ICD-10-CM | POA: Diagnosis not present

## 2017-01-21 DIAGNOSIS — N186 End stage renal disease: Secondary | ICD-10-CM | POA: Diagnosis not present

## 2017-01-21 DIAGNOSIS — N2581 Secondary hyperparathyroidism of renal origin: Secondary | ICD-10-CM | POA: Diagnosis not present

## 2017-01-21 DIAGNOSIS — D631 Anemia in chronic kidney disease: Secondary | ICD-10-CM | POA: Diagnosis not present

## 2017-01-21 DIAGNOSIS — E162 Hypoglycemia, unspecified: Secondary | ICD-10-CM | POA: Diagnosis not present

## 2017-01-23 DIAGNOSIS — E162 Hypoglycemia, unspecified: Secondary | ICD-10-CM | POA: Diagnosis not present

## 2017-01-23 DIAGNOSIS — N2581 Secondary hyperparathyroidism of renal origin: Secondary | ICD-10-CM | POA: Diagnosis not present

## 2017-01-23 DIAGNOSIS — D631 Anemia in chronic kidney disease: Secondary | ICD-10-CM | POA: Diagnosis not present

## 2017-01-23 DIAGNOSIS — N186 End stage renal disease: Secondary | ICD-10-CM | POA: Diagnosis not present

## 2017-01-24 ENCOUNTER — Ambulatory Visit (INDEPENDENT_AMBULATORY_CARE_PROVIDER_SITE_OTHER): Payer: Medicare Other | Admitting: Family Medicine

## 2017-01-24 ENCOUNTER — Encounter: Payer: Self-pay | Admitting: Family Medicine

## 2017-01-24 VITALS — BP 113/75 | HR 64 | Temp 98.3°F | Resp 16 | Ht 68.0 in | Wt 185.0 lb

## 2017-01-24 DIAGNOSIS — E559 Vitamin D deficiency, unspecified: Secondary | ICD-10-CM | POA: Diagnosis not present

## 2017-01-24 DIAGNOSIS — E1122 Type 2 diabetes mellitus with diabetic chronic kidney disease: Secondary | ICD-10-CM | POA: Diagnosis not present

## 2017-01-24 DIAGNOSIS — I120 Hypertensive chronic kidney disease with stage 5 chronic kidney disease or end stage renal disease: Secondary | ICD-10-CM | POA: Diagnosis not present

## 2017-01-24 DIAGNOSIS — Z992 Dependence on renal dialysis: Secondary | ICD-10-CM

## 2017-01-24 DIAGNOSIS — N186 End stage renal disease: Secondary | ICD-10-CM

## 2017-01-24 LAB — POCT GLYCOSYLATED HEMOGLOBIN (HGB A1C): Hemoglobin A1C: 5.2 (ref ?–5.7)

## 2017-01-24 NOTE — Assessment & Plan Note (Signed)
Prior mild low Vitamin D at 60 Advised to discuss repletion with Renal due to ESRD

## 2017-01-24 NOTE — Progress Notes (Signed)
Subjective:    Patient ID: Luis Walter, male    DOB: 1951-07-13, 66 y.o.   MRN: [id number]  Luis Walter is a 66 y.o. male presenting on 01/24/2017 for Diabetes  Wife present today at visit.  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 Doing well CBGs: Checks CBGs x3 weekly at HD, avg 100-120s - highest to report was 127  Meds: None currently Lifestyle: Diet (continues to adhere to DM diet, drinks water and sugar free lemonade) / Exercise (increased exercise and outdoor activities, yardwork) - Followed by [facility], Dr Bishop [place]) - Admits he still voids small amount of urine regularly Denies hypoglycemia, polyuria, visual changes, numbness or tingling.  CHRONIC HTN: Reports no concerns, BP is controlled often when checked at HD. Current Meds - Metoprolol 12.5mg  BID Reports good compliance, took meds today. Tolerating well, w/o complaints. Denies CP, dyspnea, HA, edema, dizziness / lightheadedness  VITAMIN D DEFICIENCY Last checked 10/2016 with low result 21. He is followed by Nephrology, not taking Vitamin D at this time.  Social History  Substance Use Topics  . Smoking status: Former Research scientist (life sciences)  . Smokeless tobacco: Never Used  . Alcohol use No    Review of Systems Per HPI unless specifically indicated above     Objective:    BP 113/75   Pulse 64   Temp 98.3 F (36.8 C) (Oral)   Resp 16   Ht 5\' 8"  (1.727 m)   Wt 185 lb (83.9 kg)   BMI 28.13 kg/m   Wt Readings from Last 3 Encounters:  01/24/17 185 lb (83.9 kg)  10/18/16 184 lb (83.5 kg)  10/16/16 183 lb 12 oz (83.3 kg)    Physical Exam  Constitutional: He is oriented to person, place, and time. He appears well-developed and well-nourished. No distress.  Chronically ill but currently well appearing, comfortable, cooperative  HENT:  Head: Normocephalic and atraumatic.  Mouth/Throat: Oropharynx is clear and moist.  Eyes: Conjunctivae  are normal. Right eye exhibits no discharge. Left eye exhibits no discharge.  Neck: Normal range of motion. Neck supple.  Cardiovascular: Normal rate, regular rhythm, normal heart sounds and intact distal pulses.   No murmur heard. Pulmonary/Chest: Effort normal and breath sounds normal. No respiratory distress. He has no wheezes. He has no rales.  Good air movement, speaks full sentences. No focal abnormalities  Musculoskeletal: He exhibits no edema or tenderness.  Lymphadenopathy:    He has no cervical adenopathy.  Neurological: He is alert and oriented to person, place, and time.  Skin: Skin is warm and dry. No rash noted. He is not diaphoretic. No erythema.  Psychiatric: He has a normal mood and affect. His behavior is normal.  Well groomed, good eye contact, normal speech and thoughts  Nursing note and vitals reviewed.    Results for orders placed or performed in visit on 01/24/17  POCT HgB A1C  Result Value Ref Range   Hemoglobin A1C 5.2 5.7      Assessment & Plan:   Problem List Items Addressed This Visit    Vitamin D deficiency    Prior mild low Vitamin D at 36 Advised to discuss repletion with Renal due to ESRD      Type 2 diabetes, controlled, with renal manifestation ([place]) - Primary    Very well controlled, A1c 5.2 (stable < 6.0) diet controlled Not on medications for >1-2 years  Plan: 1. No change today,  continue off meds 2. Cautioned hypoglycemia protocol 3. On ASA, statin. No ACE/ARB due to ESRD 4. Advised to schedule annual DM eye with established optometry (Dr Ronne Binning, [place]) request record 5. Follow-up 6 months      Relevant Orders   POCT HgB A1C (Completed)   ESRD on dialysis ([place])    Stable Followed by Nephrology, on HD MWF Discuss Vitamin D / Anemia with Renal      Benign hypertension with ESRD (end-stage renal disease) ([place])    Well-controlled HTN. Normal readings at HD. Followed by Cardiology/Nephrology  Complicated by ESRD, on  HD MWF (No ACEi/ARB)   Plan:  1.  Continue current BP regimen - Metoprolol 12.5mg  BID 2. Encourage improved lifestyle - low sodium diet, regular exercise as tolerated 3. Follow-up q 6 months - labs per Renal, otherwise yearly         No orders of the defined types were placed in this encounter.   Follow up plan: Return for  5-6 months for Diabetes A1c, HTN.   Nobie Putnam, [facility] 01/24/2017, 3:55 PM

## 2017-01-24 NOTE — Assessment & Plan Note (Signed)
Very well controlled, A1c 5.2 (stable < 6.0) diet controlled Not on medications for >1-2 years  Plan: 1. No change today, continue off meds 2. Cautioned hypoglycemia protocol 3. On ASA, statin. No ACE/ARB due to ESRD 4. Advised to schedule annual DM eye with established optometry (Dr Ronne Binning, [place]) request record 5. Follow-up 6 months

## 2017-01-24 NOTE — Patient Instructions (Addendum)
Thank you for coming in to clinic today.  1. Diabetes is doing well, A1c 5.2  Keep up the good work  No medication changes today  Please discuss Vitamin D with your Kidney Specialist - last lab result Vitamin D 21  Medications are due for refill in 06/2017  Please schedule a follow-up appointment with Dr. Parks Ranger in 5-6 months for Diabetes A1c, HTN  If you have any other questions or concerns, please feel free to call the clinic or send a message through [place]. You may also schedule an earlier appointment if necessary.  Nobie Putnam, DO [place]

## 2017-01-24 NOTE — Assessment & Plan Note (Signed)
Stable Followed by Nephrology, on HD MWF Discuss Vitamin D / Anemia with Renal

## 2017-01-24 NOTE — Assessment & Plan Note (Signed)
Well-controlled HTN. Normal readings at HD. Followed by Cardiology/Nephrology  Complicated by ESRD, on HD MWF (No ACEi/ARB)   Plan:  1.  Continue current BP regimen - Metoprolol 12.5mg  BID 2. Encourage improved lifestyle - low sodium diet, regular exercise as tolerated 3. Follow-up q 6 months - labs per Renal, otherwise yearly

## 2017-01-25 DIAGNOSIS — D631 Anemia in chronic kidney disease: Secondary | ICD-10-CM | POA: Diagnosis not present

## 2017-01-25 DIAGNOSIS — E162 Hypoglycemia, unspecified: Secondary | ICD-10-CM | POA: Diagnosis not present

## 2017-01-25 DIAGNOSIS — N2581 Secondary hyperparathyroidism of renal origin: Secondary | ICD-10-CM | POA: Diagnosis not present

## 2017-01-25 DIAGNOSIS — N186 End stage renal disease: Secondary | ICD-10-CM | POA: Diagnosis not present

## 2017-01-28 DIAGNOSIS — E162 Hypoglycemia, unspecified: Secondary | ICD-10-CM | POA: Diagnosis not present

## 2017-01-28 DIAGNOSIS — D631 Anemia in chronic kidney disease: Secondary | ICD-10-CM | POA: Diagnosis not present

## 2017-01-28 DIAGNOSIS — N186 End stage renal disease: Secondary | ICD-10-CM | POA: Diagnosis not present

## 2017-01-28 DIAGNOSIS — N2581 Secondary hyperparathyroidism of renal origin: Secondary | ICD-10-CM | POA: Diagnosis not present

## 2017-01-30 DIAGNOSIS — D631 Anemia in chronic kidney disease: Secondary | ICD-10-CM | POA: Diagnosis not present

## 2017-01-30 DIAGNOSIS — N186 End stage renal disease: Secondary | ICD-10-CM | POA: Diagnosis not present

## 2017-01-30 DIAGNOSIS — E1129 Type 2 diabetes mellitus with other diabetic kidney complication: Secondary | ICD-10-CM | POA: Diagnosis not present

## 2017-01-30 DIAGNOSIS — N2581 Secondary hyperparathyroidism of renal origin: Secondary | ICD-10-CM | POA: Diagnosis not present

## 2017-01-30 DIAGNOSIS — E162 Hypoglycemia, unspecified: Secondary | ICD-10-CM | POA: Diagnosis not present

## 2017-02-01 DIAGNOSIS — D631 Anemia in chronic kidney disease: Secondary | ICD-10-CM | POA: Diagnosis not present

## 2017-02-01 DIAGNOSIS — N186 End stage renal disease: Secondary | ICD-10-CM | POA: Diagnosis not present

## 2017-02-01 DIAGNOSIS — N2581 Secondary hyperparathyroidism of renal origin: Secondary | ICD-10-CM | POA: Diagnosis not present

## 2017-02-01 DIAGNOSIS — E162 Hypoglycemia, unspecified: Secondary | ICD-10-CM | POA: Diagnosis not present

## 2017-02-04 DIAGNOSIS — N186 End stage renal disease: Secondary | ICD-10-CM | POA: Diagnosis not present

## 2017-02-04 DIAGNOSIS — D631 Anemia in chronic kidney disease: Secondary | ICD-10-CM | POA: Diagnosis not present

## 2017-02-04 DIAGNOSIS — N2581 Secondary hyperparathyroidism of renal origin: Secondary | ICD-10-CM | POA: Diagnosis not present

## 2017-02-04 DIAGNOSIS — E162 Hypoglycemia, unspecified: Secondary | ICD-10-CM | POA: Diagnosis not present

## 2017-02-06 DIAGNOSIS — E162 Hypoglycemia, unspecified: Secondary | ICD-10-CM | POA: Diagnosis not present

## 2017-02-06 DIAGNOSIS — N2581 Secondary hyperparathyroidism of renal origin: Secondary | ICD-10-CM | POA: Diagnosis not present

## 2017-02-06 DIAGNOSIS — D631 Anemia in chronic kidney disease: Secondary | ICD-10-CM | POA: Diagnosis not present

## 2017-02-06 DIAGNOSIS — N186 End stage renal disease: Secondary | ICD-10-CM | POA: Diagnosis not present

## 2017-02-08 DIAGNOSIS — D631 Anemia in chronic kidney disease: Secondary | ICD-10-CM | POA: Diagnosis not present

## 2017-02-08 DIAGNOSIS — E162 Hypoglycemia, unspecified: Secondary | ICD-10-CM | POA: Diagnosis not present

## 2017-02-08 DIAGNOSIS — N2581 Secondary hyperparathyroidism of renal origin: Secondary | ICD-10-CM | POA: Diagnosis not present

## 2017-02-08 DIAGNOSIS — N186 End stage renal disease: Secondary | ICD-10-CM | POA: Diagnosis not present

## 2017-02-11 DIAGNOSIS — E162 Hypoglycemia, unspecified: Secondary | ICD-10-CM | POA: Diagnosis not present

## 2017-02-11 DIAGNOSIS — D631 Anemia in chronic kidney disease: Secondary | ICD-10-CM | POA: Diagnosis not present

## 2017-02-11 DIAGNOSIS — N186 End stage renal disease: Secondary | ICD-10-CM | POA: Diagnosis not present

## 2017-02-11 DIAGNOSIS — N2581 Secondary hyperparathyroidism of renal origin: Secondary | ICD-10-CM | POA: Diagnosis not present

## 2017-02-13 DIAGNOSIS — N2581 Secondary hyperparathyroidism of renal origin: Secondary | ICD-10-CM | POA: Diagnosis not present

## 2017-02-13 DIAGNOSIS — E162 Hypoglycemia, unspecified: Secondary | ICD-10-CM | POA: Diagnosis not present

## 2017-02-13 DIAGNOSIS — N186 End stage renal disease: Secondary | ICD-10-CM | POA: Diagnosis not present

## 2017-02-13 DIAGNOSIS — D631 Anemia in chronic kidney disease: Secondary | ICD-10-CM | POA: Diagnosis not present

## 2017-02-15 DIAGNOSIS — D631 Anemia in chronic kidney disease: Secondary | ICD-10-CM | POA: Diagnosis not present

## 2017-02-15 DIAGNOSIS — N186 End stage renal disease: Secondary | ICD-10-CM | POA: Diagnosis not present

## 2017-02-15 DIAGNOSIS — N2581 Secondary hyperparathyroidism of renal origin: Secondary | ICD-10-CM | POA: Diagnosis not present

## 2017-02-15 DIAGNOSIS — E162 Hypoglycemia, unspecified: Secondary | ICD-10-CM | POA: Diagnosis not present

## 2017-02-16 DIAGNOSIS — Z992 Dependence on renal dialysis: Secondary | ICD-10-CM | POA: Diagnosis not present

## 2017-02-16 DIAGNOSIS — E1122 Type 2 diabetes mellitus with diabetic chronic kidney disease: Secondary | ICD-10-CM | POA: Diagnosis not present

## 2017-02-16 DIAGNOSIS — N186 End stage renal disease: Secondary | ICD-10-CM | POA: Diagnosis not present

## 2017-02-18 DIAGNOSIS — E162 Hypoglycemia, unspecified: Secondary | ICD-10-CM | POA: Diagnosis not present

## 2017-02-18 DIAGNOSIS — N2581 Secondary hyperparathyroidism of renal origin: Secondary | ICD-10-CM | POA: Diagnosis not present

## 2017-02-18 DIAGNOSIS — N186 End stage renal disease: Secondary | ICD-10-CM | POA: Diagnosis not present

## 2017-02-20 DIAGNOSIS — N186 End stage renal disease: Secondary | ICD-10-CM | POA: Diagnosis not present

## 2017-02-20 DIAGNOSIS — N2581 Secondary hyperparathyroidism of renal origin: Secondary | ICD-10-CM | POA: Diagnosis not present

## 2017-02-20 DIAGNOSIS — E162 Hypoglycemia, unspecified: Secondary | ICD-10-CM | POA: Diagnosis not present

## 2017-02-22 DIAGNOSIS — N2581 Secondary hyperparathyroidism of renal origin: Secondary | ICD-10-CM | POA: Diagnosis not present

## 2017-02-22 DIAGNOSIS — E162 Hypoglycemia, unspecified: Secondary | ICD-10-CM | POA: Diagnosis not present

## 2017-02-22 DIAGNOSIS — N186 End stage renal disease: Secondary | ICD-10-CM | POA: Diagnosis not present

## 2017-02-25 DIAGNOSIS — N2581 Secondary hyperparathyroidism of renal origin: Secondary | ICD-10-CM | POA: Diagnosis not present

## 2017-02-25 DIAGNOSIS — N186 End stage renal disease: Secondary | ICD-10-CM | POA: Diagnosis not present

## 2017-02-25 DIAGNOSIS — E162 Hypoglycemia, unspecified: Secondary | ICD-10-CM | POA: Diagnosis not present

## 2017-02-27 DIAGNOSIS — N186 End stage renal disease: Secondary | ICD-10-CM | POA: Diagnosis not present

## 2017-02-27 DIAGNOSIS — E162 Hypoglycemia, unspecified: Secondary | ICD-10-CM | POA: Diagnosis not present

## 2017-02-27 DIAGNOSIS — N2581 Secondary hyperparathyroidism of renal origin: Secondary | ICD-10-CM | POA: Diagnosis not present

## 2017-03-01 DIAGNOSIS — N186 End stage renal disease: Secondary | ICD-10-CM | POA: Diagnosis not present

## 2017-03-01 DIAGNOSIS — E162 Hypoglycemia, unspecified: Secondary | ICD-10-CM | POA: Diagnosis not present

## 2017-03-01 DIAGNOSIS — N2581 Secondary hyperparathyroidism of renal origin: Secondary | ICD-10-CM | POA: Diagnosis not present

## 2017-03-04 DIAGNOSIS — N186 End stage renal disease: Secondary | ICD-10-CM | POA: Diagnosis not present

## 2017-03-04 DIAGNOSIS — E162 Hypoglycemia, unspecified: Secondary | ICD-10-CM | POA: Diagnosis not present

## 2017-03-04 DIAGNOSIS — N2581 Secondary hyperparathyroidism of renal origin: Secondary | ICD-10-CM | POA: Diagnosis not present

## 2017-03-06 DIAGNOSIS — N2581 Secondary hyperparathyroidism of renal origin: Secondary | ICD-10-CM | POA: Diagnosis not present

## 2017-03-06 DIAGNOSIS — N186 End stage renal disease: Secondary | ICD-10-CM | POA: Diagnosis not present

## 2017-03-06 DIAGNOSIS — E162 Hypoglycemia, unspecified: Secondary | ICD-10-CM | POA: Diagnosis not present

## 2017-03-08 DIAGNOSIS — E162 Hypoglycemia, unspecified: Secondary | ICD-10-CM | POA: Diagnosis not present

## 2017-03-08 DIAGNOSIS — N186 End stage renal disease: Secondary | ICD-10-CM | POA: Diagnosis not present

## 2017-03-08 DIAGNOSIS — N2581 Secondary hyperparathyroidism of renal origin: Secondary | ICD-10-CM | POA: Diagnosis not present

## 2017-03-11 DIAGNOSIS — N186 End stage renal disease: Secondary | ICD-10-CM | POA: Diagnosis not present

## 2017-03-11 DIAGNOSIS — N2581 Secondary hyperparathyroidism of renal origin: Secondary | ICD-10-CM | POA: Diagnosis not present

## 2017-03-11 DIAGNOSIS — E162 Hypoglycemia, unspecified: Secondary | ICD-10-CM | POA: Diagnosis not present

## 2017-03-13 DIAGNOSIS — N2581 Secondary hyperparathyroidism of renal origin: Secondary | ICD-10-CM | POA: Diagnosis not present

## 2017-03-13 DIAGNOSIS — N186 End stage renal disease: Secondary | ICD-10-CM | POA: Diagnosis not present

## 2017-03-13 DIAGNOSIS — E162 Hypoglycemia, unspecified: Secondary | ICD-10-CM | POA: Diagnosis not present

## 2017-03-15 DIAGNOSIS — N2581 Secondary hyperparathyroidism of renal origin: Secondary | ICD-10-CM | POA: Diagnosis not present

## 2017-03-15 DIAGNOSIS — E162 Hypoglycemia, unspecified: Secondary | ICD-10-CM | POA: Diagnosis not present

## 2017-03-15 DIAGNOSIS — N186 End stage renal disease: Secondary | ICD-10-CM | POA: Diagnosis not present

## 2017-03-18 DIAGNOSIS — N186 End stage renal disease: Secondary | ICD-10-CM | POA: Diagnosis not present

## 2017-03-18 DIAGNOSIS — E162 Hypoglycemia, unspecified: Secondary | ICD-10-CM | POA: Diagnosis not present

## 2017-03-18 DIAGNOSIS — N2581 Secondary hyperparathyroidism of renal origin: Secondary | ICD-10-CM | POA: Diagnosis not present

## 2017-03-19 DIAGNOSIS — N186 End stage renal disease: Secondary | ICD-10-CM | POA: Diagnosis not present

## 2017-03-19 DIAGNOSIS — E1122 Type 2 diabetes mellitus with diabetic chronic kidney disease: Secondary | ICD-10-CM | POA: Diagnosis not present

## 2017-03-19 DIAGNOSIS — Z992 Dependence on renal dialysis: Secondary | ICD-10-CM | POA: Diagnosis not present

## 2017-03-20 DIAGNOSIS — N186 End stage renal disease: Secondary | ICD-10-CM | POA: Diagnosis not present

## 2017-03-20 DIAGNOSIS — N2581 Secondary hyperparathyroidism of renal origin: Secondary | ICD-10-CM | POA: Diagnosis not present

## 2017-03-20 DIAGNOSIS — E162 Hypoglycemia, unspecified: Secondary | ICD-10-CM | POA: Diagnosis not present

## 2017-03-22 DIAGNOSIS — N186 End stage renal disease: Secondary | ICD-10-CM | POA: Diagnosis not present

## 2017-03-22 DIAGNOSIS — E162 Hypoglycemia, unspecified: Secondary | ICD-10-CM | POA: Diagnosis not present

## 2017-03-22 DIAGNOSIS — N2581 Secondary hyperparathyroidism of renal origin: Secondary | ICD-10-CM | POA: Diagnosis not present

## 2017-03-25 DIAGNOSIS — N186 End stage renal disease: Secondary | ICD-10-CM | POA: Diagnosis not present

## 2017-03-25 DIAGNOSIS — N2581 Secondary hyperparathyroidism of renal origin: Secondary | ICD-10-CM | POA: Diagnosis not present

## 2017-03-25 DIAGNOSIS — E162 Hypoglycemia, unspecified: Secondary | ICD-10-CM | POA: Diagnosis not present

## 2017-03-27 DIAGNOSIS — E162 Hypoglycemia, unspecified: Secondary | ICD-10-CM | POA: Diagnosis not present

## 2017-03-27 DIAGNOSIS — N186 End stage renal disease: Secondary | ICD-10-CM | POA: Diagnosis not present

## 2017-03-27 DIAGNOSIS — N2581 Secondary hyperparathyroidism of renal origin: Secondary | ICD-10-CM | POA: Diagnosis not present

## 2017-03-28 DIAGNOSIS — E162 Hypoglycemia, unspecified: Secondary | ICD-10-CM | POA: Diagnosis not present

## 2017-03-28 DIAGNOSIS — N2581 Secondary hyperparathyroidism of renal origin: Secondary | ICD-10-CM | POA: Diagnosis not present

## 2017-03-28 DIAGNOSIS — N186 End stage renal disease: Secondary | ICD-10-CM | POA: Diagnosis not present

## 2017-04-01 DIAGNOSIS — E162 Hypoglycemia, unspecified: Secondary | ICD-10-CM | POA: Diagnosis not present

## 2017-04-01 DIAGNOSIS — N186 End stage renal disease: Secondary | ICD-10-CM | POA: Diagnosis not present

## 2017-04-01 DIAGNOSIS — N2581 Secondary hyperparathyroidism of renal origin: Secondary | ICD-10-CM | POA: Diagnosis not present

## 2017-04-03 DIAGNOSIS — N2581 Secondary hyperparathyroidism of renal origin: Secondary | ICD-10-CM | POA: Diagnosis not present

## 2017-04-03 DIAGNOSIS — E162 Hypoglycemia, unspecified: Secondary | ICD-10-CM | POA: Diagnosis not present

## 2017-04-03 DIAGNOSIS — N186 End stage renal disease: Secondary | ICD-10-CM | POA: Diagnosis not present

## 2017-04-05 DIAGNOSIS — N186 End stage renal disease: Secondary | ICD-10-CM | POA: Diagnosis not present

## 2017-04-05 DIAGNOSIS — E162 Hypoglycemia, unspecified: Secondary | ICD-10-CM | POA: Diagnosis not present

## 2017-04-05 DIAGNOSIS — N2581 Secondary hyperparathyroidism of renal origin: Secondary | ICD-10-CM | POA: Diagnosis not present

## 2017-04-08 DIAGNOSIS — N2581 Secondary hyperparathyroidism of renal origin: Secondary | ICD-10-CM | POA: Diagnosis not present

## 2017-04-08 DIAGNOSIS — N186 End stage renal disease: Secondary | ICD-10-CM | POA: Diagnosis not present

## 2017-04-08 DIAGNOSIS — E162 Hypoglycemia, unspecified: Secondary | ICD-10-CM | POA: Diagnosis not present

## 2017-04-09 ENCOUNTER — Ambulatory Visit (INDEPENDENT_AMBULATORY_CARE_PROVIDER_SITE_OTHER): Payer: Medicare Other

## 2017-04-09 VITALS — BP 120/64 | HR 82 | Temp 98.4°F | Resp 16 | Ht 66.0 in | Wt 179.6 lb

## 2017-04-09 DIAGNOSIS — Z0000 Encounter for general adult medical examination without abnormal findings: Secondary | ICD-10-CM | POA: Diagnosis not present

## 2017-04-09 NOTE — Progress Notes (Signed)
Subjective:   Luis Walter is a 66 y.o. male who presents for Medicare Annual/Subsequent preventive examination.  Review of Systems: Cardiac Risk Factors include: male gender;diabetes mellitus;dyslipidemia;hypertension;advanced age (>58men, >69 women)     Objective:    Vitals: BP 120/64 (BP Location: Right Arm, Patient Position: Sitting)   Pulse 82   Temp 98.4 F (36.9 C)   Resp 16   Ht 5\' 6"  (1.676 m)   Wt 179 lb 9.6 oz (81.5 kg)   BMI 28.99 kg/m   Body mass index is 28.99 kg/m.  Tobacco History  Smoking Status  . Former Smoker  . Quit date: 08/20/2006  Smokeless Tobacco  . Never Used    Comment: estimated quit date      Counseling given: Not Answered   Past Medical History:  Diagnosis Date  . CAD (coronary artery disease)    a. cardiac cath 2014: LM nl, mLAD 30%, dLAD 30%, ostLCx 50%, mid ramus 20% OM3 80% pRCA 20%, mRCA 20%, PDA 100% w/ left to right collats, medically managed  b.10/2015: NSTEMI   . Chronic systolic CHF (congestive heart failure) ([place])    a. echo 12/2014: EF 35-40%, mod diffuse HK, RWMA cannot be excluded, LV diastolic fxn parameters nl, mild MR, moderately dilated LA, mildly dilated RV internal cavity size, mild TR, PASP nl  . Dialysis patient ([place])   . DM2 (diabetes mellitus, type 2) ([place])   . ESRD (end stage renal disease) ([place])    a. on HD M,W,F; b. previously on [facility] list at [facility] - removed 2016  . Gallstones   . Stroke [facility], [place])    Past Surgical History:  Procedure Laterality Date  . AV FISTULA PLACEMENT Left   . COLONOSCOPY WITH PROPOFOL N/A 05/31/2015   Procedure: COLONOSCOPY WITH PROPOFOL;  Surgeon: Lollie Sails, MD;  Location: [facility] ENDOSCOPY;  Service: Endoscopy;  Laterality: N/A;  . ESOPHAGOGASTRODUODENOSCOPY (EGD) WITH PROPOFOL N/A 05/31/2015   Procedure: ESOPHAGOGASTRODUODENOSCOPY (EGD) WITH PROPOFOL;  Surgeon: Lollie Sails, MD;  Location: [facility] ENDOSCOPY;  Service: Endoscopy;  Laterality: N/A;   Family History    Problem Relation Age of Onset  . Hypertension Unknown   . Diabetes Unknown   . Diabetes Mother   . Diabetes Father   . Hypertension Father    History  Sexual Activity  . Sexual activity: Not on file    Outpatient Encounter Prescriptions as of 04/09/2017  Medication Sig  . aspirin EC 81 MG tablet Take 81 mg by mouth at bedtime.  Marland Kitchen atorvastatin (LIPITOR) 40 MG tablet Take 1 tablet (40 mg total) by mouth daily at 6 PM.  . cinacalcet (SENSIPAR) 30 MG tablet Take 30 mg by mouth daily.  . clindamycin (CLEOCIN T) 1 % lotion Apply topically 2 (two) times daily.  . fluticasone (FLONASE) 50 MCG/ACT nasal spray Place 2 sprays into both nostrils daily.  Marland Kitchen loratadine (CLARITIN) 10 MG tablet Take 1 tablet every 48 hours as needed for allergy symptoms.  . metoprolol tartrate (LOPRESSOR) 25 MG tablet Take 0.5 tablets (12.5 mg total) by mouth 2 (two) times daily.  . Nutritional Supplements (FEEDING SUPPLEMENT, NEPRO CARB STEADY,) LIQD Take 237 mLs by mouth 2 (two) times daily between meals.  . sevelamer carbonate (RENVELA) 800 MG tablet Take 3,200 mg by mouth 3 (three) times daily with meals.  . [DISCONTINUED] adapalene (DIFFERIN) 0.1 % gel Apply topically at bedtime. (Patient not taking: Reported on 04/09/2017)  . [DISCONTINUED] ipratropium (ATROVENT) 0.06 % nasal spray Place 2 sprays into  both nostrils 4 (four) times daily. For up to 5-7 days then stop. (Patient not taking: Reported on 04/09/2017)   No facility-administered encounter medications on file as of 04/09/2017.     Activities of Daily Living In your present state of health, do you have any difficulty performing the following activities: 04/09/2017  Hearing? N  Vision? Y  Difficulty concentrating or making decisions? N  Walking or climbing stairs? N  Dressing or bathing? N  Doing errands, shopping? N  Preparing Food and eating ? N  Using the Toilet? N  In the past six months, have you accidently leaked urine? N  Do you have problems  with loss of bowel control? N  Managing your Medications? N  Managing your Finances? N  Housekeeping or managing your Housekeeping? N  Some recent data might be hidden    Patient Care Team: Olin Hauser, DO as PCP - General (Family Medicine) Wellington Hampshire, MD as Consulting Physician (Cardiology)   Assessment:     Exercise Activities and Dietary recommendations Current Exercise Habits: Home exercise routine, Type of exercise: strength training/weights, Time (Minutes): 30, Frequency (Times/Week): 3, Weekly Exercise (Minutes/Week): 90, Intensity: Mild, Exercise limited by: None identified  Goals    None     Fall Risk Fall Risk  04/09/2017 07/07/2015  Falls in the past year? No No   Depression Screen PHQ 2/9 Scores 04/09/2017 07/07/2015  PHQ - 2 Score 0 0    Cognitive Function     6CIT Screen 04/09/2017  What Year? 4 points  What month? 0 points  What time? 0 points  Count back from 20 0 points  Months in reverse 0 points  Repeat phrase 10 points  Total Score 14    Immunization History  Administered Date(s) Administered  . Pneumococcal Polysaccharide-23 07/07/2015   Screening Tests Health Maintenance  Topic Date Due  . INFLUENZA VACCINE  03/20/2017  . HEMOGLOBIN A1C  07/26/2017  . OPHTHALMOLOGY EXAM  09/20/2017  . FOOT EXAM  10/18/2017  . PNEUMOCOCCAL POLYSACCHARIDE VACCINE (2) 07/06/2020  . COLONOSCOPY  05/30/2025  . TETANUS/TDAP  02/17/2026  . Hepatitis C Screening  Completed  . HIV Screening  Completed      Plan:    I have personally reviewed and addressed the Medicare Annual Wellness questionnaire and have noted the following in the patient's chart:  A. Medical and social history B. Use of alcohol, tobacco or illicit drugs  C. Current medications and supplements D. Functional ability and status E.  Nutritional status F.  Physical activity G. Advance directives H. List of other physicians I.  Hospitalizations, surgeries, and ER visits  in previous 12 months J.  [place] such as hearing and vision if needed, cognitive and depression L. Referrals and appointments  In addition, I have reviewed and discussed with patient certain preventive protocols, quality metrics, and best practice recommendations. A written personalized care plan for preventive services as well as general preventive health recommendations were provided to patient.   Signed,  Tyler Aas, LPN Nurse Health Advisor   MD Recommendations:none

## 2017-04-09 NOTE — Patient Instructions (Addendum)
[name] , Thank you for taking time to come for your Medicare Wellness Visit. I appreciate your ongoing commitment to your health goals. Please review the following plan we discussed and let me know if I can assist you in the future.   Screening recommendations/referrals: Colonoscopy: completed 05/31/2015 Recommended yearly ophthalmology/optometry visit for glaucoma screening and checkup Recommended yearly dental visit for hygiene and checkup  Vaccinations: Influenza vaccine: up to date, due 04/2017 Pneumococcal vaccine: due at age 78 Tdap vaccine: up to date Shingles vaccine:bring a copy of your immunization record  Advanced directives: Advance directive discussed with you today. I have provided a copy for you to complete at home and have notarized. Once this is complete please bring a copy in to our office so we can scan it into your chart.  Conditions/risks identified: none  Next appointment: Follow up on 07/02/2017 at 9:40am with Dr.Karamaelgos. Follow up in one year for your annual wellness exam.   Preventive Care 40-64 Years, Male Preventive care refers to lifestyle choices and visits with your health care provider that can promote health and wellness. What does preventive care include?  A yearly physical exam. This is also called an annual well check.  Dental exams once or twice a year.  Routine eye exams. Ask your health care provider how often you should have your eyes checked.  Personal lifestyle choices, including:  Daily care of your teeth and gums.  Regular physical activity.  Eating a healthy diet.  Avoiding tobacco and drug use.  Limiting alcohol use.  Practicing safe sex.  Taking low-dose aspirin every day starting at age 109. What happens during an annual well check? The services and screenings done by your health care provider during your annual well check will depend on your age, overall health, lifestyle risk factors, and family history of  disease. Counseling  Your health care provider may ask you questions about your:  Alcohol use.  Tobacco use.  Drug use.  Emotional well-being.  Home and relationship well-being.  Sexual activity.  Eating habits.  Work and work Statistician. Screening  You may have the following tests or measurements:  Height, weight, and BMI.  Blood pressure.  Lipid and cholesterol levels. These may be checked every 5 years, or more frequently if you are over 48 years old.  Skin check.  Lung cancer screening. You may have this screening every year starting at age 22 if you have a 30-pack-year history of smoking and currently smoke or have quit within the past 15 years.  Fecal occult blood test (FOBT) of the stool. You may have this test every year starting at age 24.  Flexible sigmoidoscopy or colonoscopy. You may have a sigmoidoscopy every 5 years or a colonoscopy every 10 years starting at age 35.  Prostate cancer screening. Recommendations will vary depending on your family history and other risks.  Hepatitis C blood test.  Hepatitis B blood test.  Sexually transmitted disease (STD) testing.  Diabetes screening. This is done by checking your blood sugar (glucose) after you have not eaten for a while (fasting). You may have this done every 1-3 years. Discuss your test results, treatment options, and if necessary, the need for more tests with your health care provider. Vaccines  Your health care provider may recommend certain vaccines, such as:  Influenza vaccine. This is recommended every year.  Tetanus, diphtheria, and acellular pertussis (Tdap, Td) vaccine. You may need a Td booster every 10 years.  Zoster vaccine. You may need this  after age 73.  Pneumococcal 13-valent conjugate (PCV13) vaccine. You may need this if you have certain conditions and have not been vaccinated.  Pneumococcal polysaccharide (PPSV23) vaccine. You may need one or two doses if you smoke cigarettes  or if you have certain conditions. Talk to your health care provider about which screenings and vaccines you need and how often you need them. This information is not intended to replace advice given to you by your health care provider. Make sure you discuss any questions you have with your health care provider. Document Released: 09/02/2015 Document Revised: 04/25/2016 Document Reviewed: 06/07/2015 Elsevier Interactive Patient Education  2017 [employer] Prevention in the Home Falls can cause injuries. They can happen to people of all ages. There are many things you can do to make your home safe and to help prevent falls. What can I do on the outside of my home?  Regularly fix the edges of walkways and driveways and fix any cracks.  Remove anything that might make you trip as you walk through a door, such as a raised step or threshold.  Trim any bushes or trees on the path to your home.  Use bright outdoor lighting.  Clear any walking paths of anything that might make someone trip, such as rocks or tools.  Regularly check to see if handrails are loose or broken. Make sure that both sides of any steps have handrails.  Any raised decks and porches should have guardrails on the edges.  Have any leaves, snow, or ice cleared regularly.  Use sand or salt on walking paths during winter.  Clean up any spills in your garage right away. This includes oil or grease spills. What can I do in the bathroom?  Use night lights.  Install grab bars by the toilet and in the tub and shower. Do not use towel bars as grab bars.  Use non-skid mats or decals in the tub or shower.  If you need to sit down in the shower, use a plastic, non-slip stool.  Keep the floor dry. Clean up any water that spills on the floor as soon as it happens.  Remove soap buildup in the tub or shower regularly.  Attach bath mats securely with double-sided non-slip rug tape.  Do not have throw rugs and other  things on the floor that can make you trip. What can I do in the bedroom?  Use night lights.  Make sure that you have a light by your bed that is easy to reach.  Do not use any sheets or blankets that are too big for your bed. They should not hang down onto the floor.  Have a firm chair that has side arms. You can use this for support while you get dressed.  Do not have throw rugs and other things on the floor that can make you trip. What can I do in the kitchen?  Clean up any spills right away.  Avoid walking on wet floors.  Keep items that you use a lot in easy-to-reach places.  If you need to reach something above you, use a strong step stool that has a grab bar.  Keep electrical cords out of the way.  Do not use floor polish or wax that makes floors slippery. If you must use wax, use non-skid floor wax.  Do not have throw rugs and other things on the floor that can make you trip. What can I do with my stairs?  Do not leave  any items on the stairs.  Make sure that there are handrails on both sides of the stairs and use them. Fix handrails that are broken or loose. Make sure that handrails are as long as the stairways.  Check any carpeting to make sure that it is firmly attached to the stairs. Fix any carpet that is loose or worn.  Avoid having throw rugs at the top or bottom of the stairs. If you do have throw rugs, attach them to the floor with carpet tape.  Make sure that you have a light switch at the top of the stairs and the bottom of the stairs. If you do not have them, ask someone to add them for you. What else can I do to help prevent falls?  Wear shoes that:  Do not have high heels.  Have rubber bottoms.  Are comfortable and fit you well.  Are closed at the toe. Do not wear sandals.  If you use a stepladder:  Make sure that it is fully opened. Do not climb a closed stepladder.  Make sure that both sides of the stepladder are locked into place.  Ask  someone to hold it for you, if possible.  Clearly mark and make sure that you can see:  Any grab bars or handrails.  First and last steps.  Where the edge of each step is.  Use tools that help you move around (mobility aids) if they are needed. These include:  Canes.  Walkers.  Scooters.  Crutches.  Turn on the lights when you go into a dark area. Replace any light bulbs as soon as they burn out.  Set up your furniture so you have a clear path. Avoid moving your furniture around.  If any of your floors are uneven, fix them.  If there are any pets around you, be aware of where they are.  Review your medicines with your doctor. Some medicines can make you feel dizzy. This can increase your chance of falling. Ask your doctor what other things that you can do to help prevent falls. This information is not intended to replace advice given to you by your health care provider. Make sure you discuss any questions you have with your health care provider. Document Released: 06/02/2009 Document Revised: 01/12/2016 Document Reviewed: 09/10/2014 Elsevier Interactive Patient Education  2017 [employer].

## 2017-04-10 DIAGNOSIS — E162 Hypoglycemia, unspecified: Secondary | ICD-10-CM | POA: Diagnosis not present

## 2017-04-10 DIAGNOSIS — N186 End stage renal disease: Secondary | ICD-10-CM | POA: Diagnosis not present

## 2017-04-10 DIAGNOSIS — N2581 Secondary hyperparathyroidism of renal origin: Secondary | ICD-10-CM | POA: Diagnosis not present

## 2017-04-12 DIAGNOSIS — N2581 Secondary hyperparathyroidism of renal origin: Secondary | ICD-10-CM | POA: Diagnosis not present

## 2017-04-12 DIAGNOSIS — N186 End stage renal disease: Secondary | ICD-10-CM | POA: Diagnosis not present

## 2017-04-12 DIAGNOSIS — E162 Hypoglycemia, unspecified: Secondary | ICD-10-CM | POA: Diagnosis not present

## 2017-04-15 DIAGNOSIS — N2581 Secondary hyperparathyroidism of renal origin: Secondary | ICD-10-CM | POA: Diagnosis not present

## 2017-04-15 DIAGNOSIS — E162 Hypoglycemia, unspecified: Secondary | ICD-10-CM | POA: Diagnosis not present

## 2017-04-15 DIAGNOSIS — N186 End stage renal disease: Secondary | ICD-10-CM | POA: Diagnosis not present

## 2017-04-17 DIAGNOSIS — E162 Hypoglycemia, unspecified: Secondary | ICD-10-CM | POA: Diagnosis not present

## 2017-04-17 DIAGNOSIS — N186 End stage renal disease: Secondary | ICD-10-CM | POA: Diagnosis not present

## 2017-04-17 DIAGNOSIS — N2581 Secondary hyperparathyroidism of renal origin: Secondary | ICD-10-CM | POA: Diagnosis not present

## 2017-04-19 DIAGNOSIS — N2581 Secondary hyperparathyroidism of renal origin: Secondary | ICD-10-CM | POA: Diagnosis not present

## 2017-04-19 DIAGNOSIS — E162 Hypoglycemia, unspecified: Secondary | ICD-10-CM | POA: Diagnosis not present

## 2017-04-19 DIAGNOSIS — E1122 Type 2 diabetes mellitus with diabetic chronic kidney disease: Secondary | ICD-10-CM | POA: Diagnosis not present

## 2017-04-19 DIAGNOSIS — N186 End stage renal disease: Secondary | ICD-10-CM | POA: Diagnosis not present

## 2017-04-19 DIAGNOSIS — Z992 Dependence on renal dialysis: Secondary | ICD-10-CM | POA: Diagnosis not present

## 2017-04-22 DIAGNOSIS — N186 End stage renal disease: Secondary | ICD-10-CM | POA: Diagnosis not present

## 2017-04-22 DIAGNOSIS — E162 Hypoglycemia, unspecified: Secondary | ICD-10-CM | POA: Diagnosis not present

## 2017-04-22 DIAGNOSIS — N2581 Secondary hyperparathyroidism of renal origin: Secondary | ICD-10-CM | POA: Diagnosis not present

## 2017-04-22 DIAGNOSIS — Z23 Encounter for immunization: Secondary | ICD-10-CM | POA: Diagnosis not present

## 2017-04-24 DIAGNOSIS — N2581 Secondary hyperparathyroidism of renal origin: Secondary | ICD-10-CM | POA: Diagnosis not present

## 2017-04-24 DIAGNOSIS — Z23 Encounter for immunization: Secondary | ICD-10-CM | POA: Diagnosis not present

## 2017-04-24 DIAGNOSIS — E162 Hypoglycemia, unspecified: Secondary | ICD-10-CM | POA: Diagnosis not present

## 2017-04-24 DIAGNOSIS — N186 End stage renal disease: Secondary | ICD-10-CM | POA: Diagnosis not present

## 2017-04-26 DIAGNOSIS — N186 End stage renal disease: Secondary | ICD-10-CM | POA: Diagnosis not present

## 2017-04-26 DIAGNOSIS — N2581 Secondary hyperparathyroidism of renal origin: Secondary | ICD-10-CM | POA: Diagnosis not present

## 2017-04-26 DIAGNOSIS — Z23 Encounter for immunization: Secondary | ICD-10-CM | POA: Diagnosis not present

## 2017-04-26 DIAGNOSIS — E162 Hypoglycemia, unspecified: Secondary | ICD-10-CM | POA: Diagnosis not present

## 2017-04-29 DIAGNOSIS — Z23 Encounter for immunization: Secondary | ICD-10-CM | POA: Diagnosis not present

## 2017-04-29 DIAGNOSIS — N186 End stage renal disease: Secondary | ICD-10-CM | POA: Diagnosis not present

## 2017-04-29 DIAGNOSIS — E162 Hypoglycemia, unspecified: Secondary | ICD-10-CM | POA: Diagnosis not present

## 2017-04-29 DIAGNOSIS — N2581 Secondary hyperparathyroidism of renal origin: Secondary | ICD-10-CM | POA: Diagnosis not present

## 2017-05-01 DIAGNOSIS — E162 Hypoglycemia, unspecified: Secondary | ICD-10-CM | POA: Diagnosis not present

## 2017-05-01 DIAGNOSIS — E1129 Type 2 diabetes mellitus with other diabetic kidney complication: Secondary | ICD-10-CM | POA: Diagnosis not present

## 2017-05-01 DIAGNOSIS — N2581 Secondary hyperparathyroidism of renal origin: Secondary | ICD-10-CM | POA: Diagnosis not present

## 2017-05-01 DIAGNOSIS — Z23 Encounter for immunization: Secondary | ICD-10-CM | POA: Diagnosis not present

## 2017-05-01 DIAGNOSIS — N186 End stage renal disease: Secondary | ICD-10-CM | POA: Diagnosis not present

## 2017-05-02 DIAGNOSIS — N186 End stage renal disease: Secondary | ICD-10-CM | POA: Diagnosis not present

## 2017-05-02 DIAGNOSIS — E162 Hypoglycemia, unspecified: Secondary | ICD-10-CM | POA: Diagnosis not present

## 2017-05-02 DIAGNOSIS — Z23 Encounter for immunization: Secondary | ICD-10-CM | POA: Diagnosis not present

## 2017-05-02 DIAGNOSIS — N2581 Secondary hyperparathyroidism of renal origin: Secondary | ICD-10-CM | POA: Diagnosis not present

## 2017-05-06 DIAGNOSIS — N186 End stage renal disease: Secondary | ICD-10-CM | POA: Diagnosis not present

## 2017-05-06 DIAGNOSIS — Z23 Encounter for immunization: Secondary | ICD-10-CM | POA: Diagnosis not present

## 2017-05-06 DIAGNOSIS — E162 Hypoglycemia, unspecified: Secondary | ICD-10-CM | POA: Diagnosis not present

## 2017-05-06 DIAGNOSIS — N2581 Secondary hyperparathyroidism of renal origin: Secondary | ICD-10-CM | POA: Diagnosis not present

## 2017-05-08 DIAGNOSIS — E162 Hypoglycemia, unspecified: Secondary | ICD-10-CM | POA: Diagnosis not present

## 2017-05-08 DIAGNOSIS — Z23 Encounter for immunization: Secondary | ICD-10-CM | POA: Diagnosis not present

## 2017-05-08 DIAGNOSIS — N186 End stage renal disease: Secondary | ICD-10-CM | POA: Diagnosis not present

## 2017-05-08 DIAGNOSIS — N2581 Secondary hyperparathyroidism of renal origin: Secondary | ICD-10-CM | POA: Diagnosis not present

## 2017-05-10 DIAGNOSIS — N2581 Secondary hyperparathyroidism of renal origin: Secondary | ICD-10-CM | POA: Diagnosis not present

## 2017-05-10 DIAGNOSIS — N186 End stage renal disease: Secondary | ICD-10-CM | POA: Diagnosis not present

## 2017-05-10 DIAGNOSIS — Z23 Encounter for immunization: Secondary | ICD-10-CM | POA: Diagnosis not present

## 2017-05-10 DIAGNOSIS — E162 Hypoglycemia, unspecified: Secondary | ICD-10-CM | POA: Diagnosis not present

## 2017-05-13 DIAGNOSIS — N2581 Secondary hyperparathyroidism of renal origin: Secondary | ICD-10-CM | POA: Diagnosis not present

## 2017-05-13 DIAGNOSIS — N186 End stage renal disease: Secondary | ICD-10-CM | POA: Diagnosis not present

## 2017-05-13 DIAGNOSIS — Z23 Encounter for immunization: Secondary | ICD-10-CM | POA: Diagnosis not present

## 2017-05-13 DIAGNOSIS — E162 Hypoglycemia, unspecified: Secondary | ICD-10-CM | POA: Diagnosis not present

## 2017-05-15 DIAGNOSIS — Z23 Encounter for immunization: Secondary | ICD-10-CM | POA: Diagnosis not present

## 2017-05-15 DIAGNOSIS — E162 Hypoglycemia, unspecified: Secondary | ICD-10-CM | POA: Diagnosis not present

## 2017-05-15 DIAGNOSIS — N2581 Secondary hyperparathyroidism of renal origin: Secondary | ICD-10-CM | POA: Diagnosis not present

## 2017-05-15 DIAGNOSIS — N186 End stage renal disease: Secondary | ICD-10-CM | POA: Diagnosis not present

## 2017-05-17 DIAGNOSIS — Z23 Encounter for immunization: Secondary | ICD-10-CM | POA: Diagnosis not present

## 2017-05-17 DIAGNOSIS — N186 End stage renal disease: Secondary | ICD-10-CM | POA: Diagnosis not present

## 2017-05-17 DIAGNOSIS — E162 Hypoglycemia, unspecified: Secondary | ICD-10-CM | POA: Diagnosis not present

## 2017-05-17 DIAGNOSIS — N2581 Secondary hyperparathyroidism of renal origin: Secondary | ICD-10-CM | POA: Diagnosis not present

## 2017-05-19 DIAGNOSIS — N186 End stage renal disease: Secondary | ICD-10-CM | POA: Diagnosis not present

## 2017-05-19 DIAGNOSIS — Z992 Dependence on renal dialysis: Secondary | ICD-10-CM | POA: Diagnosis not present

## 2017-05-19 DIAGNOSIS — E1122 Type 2 diabetes mellitus with diabetic chronic kidney disease: Secondary | ICD-10-CM | POA: Diagnosis not present

## 2017-05-20 DIAGNOSIS — D631 Anemia in chronic kidney disease: Secondary | ICD-10-CM | POA: Diagnosis not present

## 2017-05-20 DIAGNOSIS — N186 End stage renal disease: Secondary | ICD-10-CM | POA: Diagnosis not present

## 2017-05-20 DIAGNOSIS — N2581 Secondary hyperparathyroidism of renal origin: Secondary | ICD-10-CM | POA: Diagnosis not present

## 2017-05-20 DIAGNOSIS — E162 Hypoglycemia, unspecified: Secondary | ICD-10-CM | POA: Diagnosis not present

## 2017-05-22 DIAGNOSIS — D631 Anemia in chronic kidney disease: Secondary | ICD-10-CM | POA: Diagnosis not present

## 2017-05-22 DIAGNOSIS — N2581 Secondary hyperparathyroidism of renal origin: Secondary | ICD-10-CM | POA: Diagnosis not present

## 2017-05-22 DIAGNOSIS — N186 End stage renal disease: Secondary | ICD-10-CM | POA: Diagnosis not present

## 2017-05-22 DIAGNOSIS — E162 Hypoglycemia, unspecified: Secondary | ICD-10-CM | POA: Diagnosis not present

## 2017-05-24 DIAGNOSIS — N2581 Secondary hyperparathyroidism of renal origin: Secondary | ICD-10-CM | POA: Diagnosis not present

## 2017-05-24 DIAGNOSIS — N186 End stage renal disease: Secondary | ICD-10-CM | POA: Diagnosis not present

## 2017-05-24 DIAGNOSIS — E162 Hypoglycemia, unspecified: Secondary | ICD-10-CM | POA: Diagnosis not present

## 2017-05-24 DIAGNOSIS — D631 Anemia in chronic kidney disease: Secondary | ICD-10-CM | POA: Diagnosis not present

## 2017-05-27 DIAGNOSIS — D631 Anemia in chronic kidney disease: Secondary | ICD-10-CM | POA: Diagnosis not present

## 2017-05-27 DIAGNOSIS — E162 Hypoglycemia, unspecified: Secondary | ICD-10-CM | POA: Diagnosis not present

## 2017-05-27 DIAGNOSIS — N186 End stage renal disease: Secondary | ICD-10-CM | POA: Diagnosis not present

## 2017-05-27 DIAGNOSIS — N2581 Secondary hyperparathyroidism of renal origin: Secondary | ICD-10-CM | POA: Diagnosis not present

## 2017-05-29 DIAGNOSIS — D631 Anemia in chronic kidney disease: Secondary | ICD-10-CM | POA: Diagnosis not present

## 2017-05-29 DIAGNOSIS — E162 Hypoglycemia, unspecified: Secondary | ICD-10-CM | POA: Diagnosis not present

## 2017-05-29 DIAGNOSIS — N2581 Secondary hyperparathyroidism of renal origin: Secondary | ICD-10-CM | POA: Diagnosis not present

## 2017-05-29 DIAGNOSIS — N186 End stage renal disease: Secondary | ICD-10-CM | POA: Diagnosis not present

## 2017-05-31 DIAGNOSIS — D631 Anemia in chronic kidney disease: Secondary | ICD-10-CM | POA: Diagnosis not present

## 2017-05-31 DIAGNOSIS — E162 Hypoglycemia, unspecified: Secondary | ICD-10-CM | POA: Diagnosis not present

## 2017-05-31 DIAGNOSIS — N186 End stage renal disease: Secondary | ICD-10-CM | POA: Diagnosis not present

## 2017-05-31 DIAGNOSIS — N2581 Secondary hyperparathyroidism of renal origin: Secondary | ICD-10-CM | POA: Diagnosis not present

## 2017-06-03 DIAGNOSIS — E162 Hypoglycemia, unspecified: Secondary | ICD-10-CM | POA: Diagnosis not present

## 2017-06-03 DIAGNOSIS — D631 Anemia in chronic kidney disease: Secondary | ICD-10-CM | POA: Diagnosis not present

## 2017-06-03 DIAGNOSIS — N186 End stage renal disease: Secondary | ICD-10-CM | POA: Diagnosis not present

## 2017-06-03 DIAGNOSIS — N2581 Secondary hyperparathyroidism of renal origin: Secondary | ICD-10-CM | POA: Diagnosis not present

## 2017-06-05 DIAGNOSIS — N2581 Secondary hyperparathyroidism of renal origin: Secondary | ICD-10-CM | POA: Diagnosis not present

## 2017-06-05 DIAGNOSIS — D631 Anemia in chronic kidney disease: Secondary | ICD-10-CM | POA: Diagnosis not present

## 2017-06-05 DIAGNOSIS — N186 End stage renal disease: Secondary | ICD-10-CM | POA: Diagnosis not present

## 2017-06-05 DIAGNOSIS — E162 Hypoglycemia, unspecified: Secondary | ICD-10-CM | POA: Diagnosis not present

## 2017-06-07 DIAGNOSIS — E162 Hypoglycemia, unspecified: Secondary | ICD-10-CM | POA: Diagnosis not present

## 2017-06-07 DIAGNOSIS — D631 Anemia in chronic kidney disease: Secondary | ICD-10-CM | POA: Diagnosis not present

## 2017-06-07 DIAGNOSIS — N2581 Secondary hyperparathyroidism of renal origin: Secondary | ICD-10-CM | POA: Diagnosis not present

## 2017-06-07 DIAGNOSIS — N186 End stage renal disease: Secondary | ICD-10-CM | POA: Diagnosis not present

## 2017-06-10 DIAGNOSIS — E162 Hypoglycemia, unspecified: Secondary | ICD-10-CM | POA: Diagnosis not present

## 2017-06-10 DIAGNOSIS — N186 End stage renal disease: Secondary | ICD-10-CM | POA: Diagnosis not present

## 2017-06-10 DIAGNOSIS — N2581 Secondary hyperparathyroidism of renal origin: Secondary | ICD-10-CM | POA: Diagnosis not present

## 2017-06-10 DIAGNOSIS — D631 Anemia in chronic kidney disease: Secondary | ICD-10-CM | POA: Diagnosis not present

## 2017-06-12 DIAGNOSIS — N2581 Secondary hyperparathyroidism of renal origin: Secondary | ICD-10-CM | POA: Diagnosis not present

## 2017-06-12 DIAGNOSIS — N186 End stage renal disease: Secondary | ICD-10-CM | POA: Diagnosis not present

## 2017-06-12 DIAGNOSIS — D631 Anemia in chronic kidney disease: Secondary | ICD-10-CM | POA: Diagnosis not present

## 2017-06-12 DIAGNOSIS — E162 Hypoglycemia, unspecified: Secondary | ICD-10-CM | POA: Diagnosis not present

## 2017-06-14 DIAGNOSIS — E162 Hypoglycemia, unspecified: Secondary | ICD-10-CM | POA: Diagnosis not present

## 2017-06-14 DIAGNOSIS — N2581 Secondary hyperparathyroidism of renal origin: Secondary | ICD-10-CM | POA: Diagnosis not present

## 2017-06-14 DIAGNOSIS — N186 End stage renal disease: Secondary | ICD-10-CM | POA: Diagnosis not present

## 2017-06-14 DIAGNOSIS — D631 Anemia in chronic kidney disease: Secondary | ICD-10-CM | POA: Diagnosis not present

## 2017-06-17 DIAGNOSIS — E162 Hypoglycemia, unspecified: Secondary | ICD-10-CM | POA: Diagnosis not present

## 2017-06-17 DIAGNOSIS — N2581 Secondary hyperparathyroidism of renal origin: Secondary | ICD-10-CM | POA: Diagnosis not present

## 2017-06-17 DIAGNOSIS — N186 End stage renal disease: Secondary | ICD-10-CM | POA: Diagnosis not present

## 2017-06-17 DIAGNOSIS — D631 Anemia in chronic kidney disease: Secondary | ICD-10-CM | POA: Diagnosis not present

## 2017-06-19 DIAGNOSIS — E1122 Type 2 diabetes mellitus with diabetic chronic kidney disease: Secondary | ICD-10-CM | POA: Diagnosis not present

## 2017-06-19 DIAGNOSIS — N186 End stage renal disease: Secondary | ICD-10-CM | POA: Diagnosis not present

## 2017-06-19 DIAGNOSIS — Z992 Dependence on renal dialysis: Secondary | ICD-10-CM | POA: Diagnosis not present

## 2017-06-19 DIAGNOSIS — E162 Hypoglycemia, unspecified: Secondary | ICD-10-CM | POA: Diagnosis not present

## 2017-06-19 DIAGNOSIS — N2581 Secondary hyperparathyroidism of renal origin: Secondary | ICD-10-CM | POA: Diagnosis not present

## 2017-06-19 DIAGNOSIS — D631 Anemia in chronic kidney disease: Secondary | ICD-10-CM | POA: Diagnosis not present

## 2017-06-21 DIAGNOSIS — D631 Anemia in chronic kidney disease: Secondary | ICD-10-CM | POA: Diagnosis not present

## 2017-06-21 DIAGNOSIS — N186 End stage renal disease: Secondary | ICD-10-CM | POA: Diagnosis not present

## 2017-06-21 DIAGNOSIS — N2581 Secondary hyperparathyroidism of renal origin: Secondary | ICD-10-CM | POA: Diagnosis not present

## 2017-06-21 DIAGNOSIS — E162 Hypoglycemia, unspecified: Secondary | ICD-10-CM | POA: Diagnosis not present

## 2017-06-25 ENCOUNTER — Other Ambulatory Visit: Payer: Self-pay | Admitting: Family Medicine

## 2017-06-25 DIAGNOSIS — E1169 Type 2 diabetes mellitus with other specified complication: Secondary | ICD-10-CM

## 2017-06-25 DIAGNOSIS — E785 Hyperlipidemia, unspecified: Principal | ICD-10-CM

## 2017-06-26 DIAGNOSIS — D631 Anemia in chronic kidney disease: Secondary | ICD-10-CM | POA: Diagnosis not present

## 2017-06-26 DIAGNOSIS — N186 End stage renal disease: Secondary | ICD-10-CM | POA: Diagnosis not present

## 2017-06-26 DIAGNOSIS — N2581 Secondary hyperparathyroidism of renal origin: Secondary | ICD-10-CM | POA: Diagnosis not present

## 2017-06-26 DIAGNOSIS — E162 Hypoglycemia, unspecified: Secondary | ICD-10-CM | POA: Diagnosis not present

## 2017-06-28 DIAGNOSIS — D631 Anemia in chronic kidney disease: Secondary | ICD-10-CM | POA: Diagnosis not present

## 2017-06-28 DIAGNOSIS — E162 Hypoglycemia, unspecified: Secondary | ICD-10-CM | POA: Diagnosis not present

## 2017-06-28 DIAGNOSIS — N2581 Secondary hyperparathyroidism of renal origin: Secondary | ICD-10-CM | POA: Diagnosis not present

## 2017-06-28 DIAGNOSIS — N186 End stage renal disease: Secondary | ICD-10-CM | POA: Diagnosis not present

## 2017-07-01 DIAGNOSIS — N186 End stage renal disease: Secondary | ICD-10-CM | POA: Diagnosis not present

## 2017-07-01 DIAGNOSIS — D631 Anemia in chronic kidney disease: Secondary | ICD-10-CM | POA: Diagnosis not present

## 2017-07-01 DIAGNOSIS — E162 Hypoglycemia, unspecified: Secondary | ICD-10-CM | POA: Diagnosis not present

## 2017-07-01 DIAGNOSIS — N2581 Secondary hyperparathyroidism of renal origin: Secondary | ICD-10-CM | POA: Diagnosis not present

## 2017-07-02 ENCOUNTER — Ambulatory Visit (INDEPENDENT_AMBULATORY_CARE_PROVIDER_SITE_OTHER): Payer: Medicare Other | Admitting: Family Medicine

## 2017-07-02 ENCOUNTER — Encounter: Payer: Self-pay | Admitting: Family Medicine

## 2017-07-02 VITALS — BP 135/61 | HR 70 | Temp 98.0°F | Resp 16 | Ht 68.0 in | Wt 185.0 lb

## 2017-07-02 DIAGNOSIS — I120 Hypertensive chronic kidney disease with stage 5 chronic kidney disease or end stage renal disease: Secondary | ICD-10-CM | POA: Diagnosis not present

## 2017-07-02 DIAGNOSIS — E1122 Type 2 diabetes mellitus with diabetic chronic kidney disease: Secondary | ICD-10-CM

## 2017-07-02 DIAGNOSIS — J301 Allergic rhinitis due to pollen: Secondary | ICD-10-CM | POA: Diagnosis not present

## 2017-07-02 DIAGNOSIS — N186 End stage renal disease: Secondary | ICD-10-CM

## 2017-07-02 DIAGNOSIS — Z992 Dependence on renal dialysis: Secondary | ICD-10-CM

## 2017-07-02 LAB — POCT GLYCOSYLATED HEMOGLOBIN (HGB A1C): HEMOGLOBIN A1C: 5.4 (ref ?–5.7)

## 2017-07-02 MED ORDER — METOPROLOL TARTRATE 25 MG PO TABS: 12.5000 mg | ORAL_TABLET | Freq: Every day | ORAL | 3 refills | Status: AC

## 2017-07-02 MED ORDER — LORATADINE 10 MG PO TABS: 10.0000 mg | ORAL_TABLET | Freq: Every day | ORAL | 3 refills | Status: AC

## 2017-07-02 NOTE — Progress Notes (Signed)
Subjective:    Patient ID: Luis Walter, male    DOB: [birth year], 66 y.o.   MRN: [id number]  Luis Walter is a 66 y.o. male presenting on 07/02/2017 for Diabetes  History provided by patient primarily and wife provides additional history.  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 Doing well CBGs: Checks CBGs x3 weekly at HD, avg unchanged 100-120s - highest to report was 127 Meds: None currently Lifestyle: - Diet (continues to adhere to DM diet, drinks water and sugar free lemonade) - Exercise (keeping up with increased daily exercise with walking and and outdoor activities, if not too cold) - Followed by Optometry [facility], Dr Bishop [place]), last eye exam 09/2016 - Admits he still voids small amount of urine regularly Denies hypoglycemia, polyuria, visual changes, numbness or tingling.  CHRONIC HTN: Reports no concerns, BP is controlled often when checked at HD but can run risk of lower reading at times during HD Current Meds - Metoprolol 12.5mg  PM dose only, often skips AM dose Reports good compliance, took meds today. Tolerating well, w/o complaints. Denies CP, dyspnea, HA, edema, dizziness / lightheadedness  Health Maintenance: UTD Flu Shot 05/20/17 UTD PNeumonia vaccine  Depression screen [facility] 2/9 07/02/2017 04/09/2017 07/07/2015  Decreased Interest 0 0 0  Down, Depressed, Hopeless 0 0 0  PHQ - 2 Score 0 0 0    Social History   Tobacco Use  . Smoking status: Former Smoker    Last attempt to quit: 08/20/2006    Years since quitting: 10.8  . Smokeless tobacco: Never Used  . Tobacco comment: estimated quit date   Substance Use Topics  . Alcohol use: No    Alcohol/week: 0.0 oz  . Drug use: No    Review of Systems Per HPI unless specifically indicated above     Objective:    BP 135/61   Pulse 70   Temp 98 F (36.7 C) (Oral)   Resp 16   Ht 5\' 8"  (1.727 m)   Wt 185 lb (83.9 kg)   BMI 28.13  kg/m   Wt Readings from Last 3 Encounters:  07/02/17 185 lb (83.9 kg)  04/09/17 179 lb 9.6 oz (81.5 kg)  01/24/17 185 lb (83.9 kg)    Physical Exam  Constitutional: He is oriented to person, place, and time. He appears well-developed and well-nourished. No distress.  Chronically ill but currently well appearing, comfortable, cooperative  HENT:  Head: Normocephalic and atraumatic.  Mouth/Throat: Oropharynx is clear and moist.  Eyes: Conjunctivae are normal. Right eye exhibits no discharge. Left eye exhibits no discharge.  Neck: Normal range of motion. Neck supple.  Cardiovascular: Normal rate, regular rhythm, normal heart sounds and intact distal pulses.  No murmur heard. Pulmonary/Chest: Effort normal and breath sounds normal. No respiratory distress. He has no wheezes. He has no rales.  Good air movement, speaks full sentences. No focal abnormalities  Musculoskeletal: He exhibits no edema or tenderness.  Lymphadenopathy:    He has no cervical adenopathy.  Neurological: He is alert and oriented to person, place, and time.  Skin: Skin is warm and dry. No rash noted. He is not diaphoretic. No erythema.  Psychiatric: He has a normal mood and affect. His behavior is normal.  Well groomed, good eye contact, normal speech and thoughts  Nursing note and vitals reviewed.  Results for orders placed or performed in visit on 07/02/17  POCT HgB A1C  Result  Value Ref Range   Hemoglobin A1C 5.4 5.7      Assessment & Plan:   Problem List Items Addressed This Visit    Benign hypertension with ESRD (end-stage renal disease) ([place])    Well-controlled HTN. Normal readings at HD but has had hypotension at HD in past Followed by Cardiology/Nephrology  Complicated by ESRD, on HD MWF (No ACEi/ARB)   Plan:  1.  Adjust regimen since only taking Metoprolol half tab 12.5mg  PM - not having 12 hour coverage, advised can increase dose for 25mg  PM nightly and hold AM dose for now - monitor BP to see if  appropriate, and can re-adjust if need back to half tab evening - Concern with XL dosing due to dialysis 2. Encourage improved lifestyle - low sodium diet, regular exercise as tolerated 3. Follow-up q 6 months - labs per Renal, otherwise yearly      Relevant Medications   metoprolol tartrate (LOPRESSOR) 25 MG tablet   Type 2 diabetes, controlled, with renal manifestation ([place]) - Primary    Remains very well controlled, A1c 5.2 to 5.4 (stable < 6.0) diet controlled Not on medications for >2 years  Plan: 1. No change today, continue off meds 2. Cautioned hypoglycemia protocol 3. On ASA, statin. No ACE/ARB due to ESRD 4. Follow-up 6 months DM A1c      Relevant Orders   POCT HgB A1C (Completed)    Other Visit Diagnoses    Allergic rhinitis due to pollen       Relevant Medications   loratadine (CLARITIN) 10 MG tablet      Meds ordered this encounter  Medications  . metoprolol tartrate (LOPRESSOR) 25 MG tablet    Sig: Take 0.5-1 tablets (12.5-25 mg total) at bedtime by mouth.    Dispense:  90 tablet    Refill:  3  . loratadine (CLARITIN) 10 MG tablet    Sig: Take 1 tablet (10 mg total) daily by mouth.    Dispense:  90 tablet    Refill:  3    Follow up plan: Return in about 6 months (around 12/30/2017) for DM A1c, HTN med adjust.  Luis Putnam, DO [facility] 07/03/2017, 1:16 AM

## 2017-07-02 NOTE — Patient Instructions (Addendum)
Thank you for coming to the clinic today.  1. Keep up the good work with diet and lifestyle  2. BP is controlled - as discussed you may be fine to take a WHOLE pill metoprolol 25mg  in evening, and this should be out of your system in morning only lasting about 10-12 hours.  - Keep track of BP if too strong of med can reduce by half again in future  Please schedule a Follow-up Appointment to: Return in about 6 months (around 12/30/2017) for DM A1c, HTN med adjust.  If you have any other questions or concerns, please feel free to call the clinic or send a message through [place]. You may also schedule an earlier appointment if necessary.  Additionally, you may be receiving a survey about your experience at our clinic within a few days to 1 week by e-mail or mail. We value your feedback.  Nobie Putnam, DO [place]

## 2017-07-03 DIAGNOSIS — E162 Hypoglycemia, unspecified: Secondary | ICD-10-CM | POA: Diagnosis not present

## 2017-07-03 DIAGNOSIS — D631 Anemia in chronic kidney disease: Secondary | ICD-10-CM | POA: Diagnosis not present

## 2017-07-03 DIAGNOSIS — N186 End stage renal disease: Secondary | ICD-10-CM | POA: Diagnosis not present

## 2017-07-03 DIAGNOSIS — N2581 Secondary hyperparathyroidism of renal origin: Secondary | ICD-10-CM | POA: Diagnosis not present

## 2017-07-03 NOTE — Assessment & Plan Note (Signed)
Remains very well controlled, A1c 5.2 to 5.4 (stable < 6.0) diet controlled Not on medications for >2 years  Plan: 1. No change today, continue off meds 2. Cautioned hypoglycemia protocol 3. On ASA, statin. No ACE/ARB due to ESRD 4. Follow-up 6 months DM A1c

## 2017-07-03 NOTE — Assessment & Plan Note (Signed)
Well-controlled HTN. Normal readings at HD but has had hypotension at HD in past Followed by Cardiology/Nephrology  Complicated by ESRD, on HD MWF (No ACEi/ARB)   Plan:  1.  Adjust regimen since only taking Metoprolol half tab 12.5mg  PM - not having 12 hour coverage, advised can increase dose for 25mg  PM nightly and hold AM dose for now - monitor BP to see if appropriate, and can re-adjust if need back to half tab evening - Concern with XL dosing due to dialysis 2. Encourage improved lifestyle - low sodium diet, regular exercise as tolerated 3. Follow-up q 6 months - labs per Renal, otherwise yearly

## 2017-07-05 DIAGNOSIS — N186 End stage renal disease: Secondary | ICD-10-CM | POA: Diagnosis not present

## 2017-07-05 DIAGNOSIS — D631 Anemia in chronic kidney disease: Secondary | ICD-10-CM | POA: Diagnosis not present

## 2017-07-05 DIAGNOSIS — E162 Hypoglycemia, unspecified: Secondary | ICD-10-CM | POA: Diagnosis not present

## 2017-07-05 DIAGNOSIS — N2581 Secondary hyperparathyroidism of renal origin: Secondary | ICD-10-CM | POA: Diagnosis not present

## 2017-07-07 DIAGNOSIS — D631 Anemia in chronic kidney disease: Secondary | ICD-10-CM | POA: Diagnosis not present

## 2017-07-07 DIAGNOSIS — N186 End stage renal disease: Secondary | ICD-10-CM | POA: Diagnosis not present

## 2017-07-07 DIAGNOSIS — E162 Hypoglycemia, unspecified: Secondary | ICD-10-CM | POA: Diagnosis not present

## 2017-07-07 DIAGNOSIS — N2581 Secondary hyperparathyroidism of renal origin: Secondary | ICD-10-CM | POA: Diagnosis not present

## 2017-07-09 DIAGNOSIS — E162 Hypoglycemia, unspecified: Secondary | ICD-10-CM | POA: Diagnosis not present

## 2017-07-09 DIAGNOSIS — N2581 Secondary hyperparathyroidism of renal origin: Secondary | ICD-10-CM | POA: Diagnosis not present

## 2017-07-09 DIAGNOSIS — N186 End stage renal disease: Secondary | ICD-10-CM | POA: Diagnosis not present

## 2017-07-09 DIAGNOSIS — D631 Anemia in chronic kidney disease: Secondary | ICD-10-CM | POA: Diagnosis not present

## 2017-07-12 DIAGNOSIS — D631 Anemia in chronic kidney disease: Secondary | ICD-10-CM | POA: Diagnosis not present

## 2017-07-12 DIAGNOSIS — N2581 Secondary hyperparathyroidism of renal origin: Secondary | ICD-10-CM | POA: Diagnosis not present

## 2017-07-12 DIAGNOSIS — E162 Hypoglycemia, unspecified: Secondary | ICD-10-CM | POA: Diagnosis not present

## 2017-07-12 DIAGNOSIS — N186 End stage renal disease: Secondary | ICD-10-CM | POA: Diagnosis not present

## 2017-07-15 DIAGNOSIS — D631 Anemia in chronic kidney disease: Secondary | ICD-10-CM | POA: Diagnosis not present

## 2017-07-15 DIAGNOSIS — N186 End stage renal disease: Secondary | ICD-10-CM | POA: Diagnosis not present

## 2017-07-15 DIAGNOSIS — N2581 Secondary hyperparathyroidism of renal origin: Secondary | ICD-10-CM | POA: Diagnosis not present

## 2017-07-15 DIAGNOSIS — E162 Hypoglycemia, unspecified: Secondary | ICD-10-CM | POA: Diagnosis not present

## 2017-07-17 DIAGNOSIS — N186 End stage renal disease: Secondary | ICD-10-CM | POA: Diagnosis not present

## 2017-07-17 DIAGNOSIS — D631 Anemia in chronic kidney disease: Secondary | ICD-10-CM | POA: Diagnosis not present

## 2017-07-17 DIAGNOSIS — N2581 Secondary hyperparathyroidism of renal origin: Secondary | ICD-10-CM | POA: Diagnosis not present

## 2017-07-17 DIAGNOSIS — E162 Hypoglycemia, unspecified: Secondary | ICD-10-CM | POA: Diagnosis not present

## 2017-07-19 DIAGNOSIS — N186 End stage renal disease: Secondary | ICD-10-CM | POA: Diagnosis not present

## 2017-07-19 DIAGNOSIS — D631 Anemia in chronic kidney disease: Secondary | ICD-10-CM | POA: Diagnosis not present

## 2017-07-19 DIAGNOSIS — E1122 Type 2 diabetes mellitus with diabetic chronic kidney disease: Secondary | ICD-10-CM | POA: Diagnosis not present

## 2017-07-19 DIAGNOSIS — N2581 Secondary hyperparathyroidism of renal origin: Secondary | ICD-10-CM | POA: Diagnosis not present

## 2017-07-19 DIAGNOSIS — Z992 Dependence on renal dialysis: Secondary | ICD-10-CM | POA: Diagnosis not present

## 2017-07-19 DIAGNOSIS — E162 Hypoglycemia, unspecified: Secondary | ICD-10-CM | POA: Diagnosis not present

## 2017-07-22 DIAGNOSIS — E162 Hypoglycemia, unspecified: Secondary | ICD-10-CM | POA: Diagnosis not present

## 2017-07-22 DIAGNOSIS — N186 End stage renal disease: Secondary | ICD-10-CM | POA: Diagnosis not present

## 2017-07-22 DIAGNOSIS — Z23 Encounter for immunization: Secondary | ICD-10-CM | POA: Diagnosis not present

## 2017-07-22 DIAGNOSIS — N2581 Secondary hyperparathyroidism of renal origin: Secondary | ICD-10-CM | POA: Diagnosis not present

## 2017-07-24 DIAGNOSIS — N186 End stage renal disease: Secondary | ICD-10-CM | POA: Diagnosis not present

## 2017-07-24 DIAGNOSIS — Z23 Encounter for immunization: Secondary | ICD-10-CM | POA: Diagnosis not present

## 2017-07-24 DIAGNOSIS — E162 Hypoglycemia, unspecified: Secondary | ICD-10-CM | POA: Diagnosis not present

## 2017-07-24 DIAGNOSIS — N2581 Secondary hyperparathyroidism of renal origin: Secondary | ICD-10-CM | POA: Diagnosis not present

## 2017-07-26 DIAGNOSIS — Z23 Encounter for immunization: Secondary | ICD-10-CM | POA: Diagnosis not present

## 2017-07-26 DIAGNOSIS — N186 End stage renal disease: Secondary | ICD-10-CM | POA: Diagnosis not present

## 2017-07-26 DIAGNOSIS — E162 Hypoglycemia, unspecified: Secondary | ICD-10-CM | POA: Diagnosis not present

## 2017-07-26 DIAGNOSIS — N2581 Secondary hyperparathyroidism of renal origin: Secondary | ICD-10-CM | POA: Diagnosis not present

## 2017-07-29 DIAGNOSIS — N186 End stage renal disease: Secondary | ICD-10-CM | POA: Diagnosis not present

## 2017-07-29 DIAGNOSIS — N2581 Secondary hyperparathyroidism of renal origin: Secondary | ICD-10-CM | POA: Diagnosis not present

## 2017-07-29 DIAGNOSIS — Z23 Encounter for immunization: Secondary | ICD-10-CM | POA: Diagnosis not present

## 2017-07-29 DIAGNOSIS — E162 Hypoglycemia, unspecified: Secondary | ICD-10-CM | POA: Diagnosis not present

## 2017-07-31 DIAGNOSIS — N2581 Secondary hyperparathyroidism of renal origin: Secondary | ICD-10-CM | POA: Diagnosis not present

## 2017-07-31 DIAGNOSIS — N186 End stage renal disease: Secondary | ICD-10-CM | POA: Diagnosis not present

## 2017-07-31 DIAGNOSIS — E162 Hypoglycemia, unspecified: Secondary | ICD-10-CM | POA: Diagnosis not present

## 2017-07-31 DIAGNOSIS — E1129 Type 2 diabetes mellitus with other diabetic kidney complication: Secondary | ICD-10-CM | POA: Diagnosis not present

## 2017-07-31 DIAGNOSIS — Z23 Encounter for immunization: Secondary | ICD-10-CM | POA: Diagnosis not present

## 2017-08-02 DIAGNOSIS — Z23 Encounter for immunization: Secondary | ICD-10-CM | POA: Diagnosis not present

## 2017-08-02 DIAGNOSIS — E162 Hypoglycemia, unspecified: Secondary | ICD-10-CM | POA: Diagnosis not present

## 2017-08-02 DIAGNOSIS — N186 End stage renal disease: Secondary | ICD-10-CM | POA: Diagnosis not present

## 2017-08-02 DIAGNOSIS — N2581 Secondary hyperparathyroidism of renal origin: Secondary | ICD-10-CM | POA: Diagnosis not present

## 2017-08-05 DIAGNOSIS — E162 Hypoglycemia, unspecified: Secondary | ICD-10-CM | POA: Diagnosis not present

## 2017-08-05 DIAGNOSIS — N186 End stage renal disease: Secondary | ICD-10-CM | POA: Diagnosis not present

## 2017-08-05 DIAGNOSIS — N2581 Secondary hyperparathyroidism of renal origin: Secondary | ICD-10-CM | POA: Diagnosis not present

## 2017-08-05 DIAGNOSIS — Z23 Encounter for immunization: Secondary | ICD-10-CM | POA: Diagnosis not present

## 2017-08-07 DIAGNOSIS — N2581 Secondary hyperparathyroidism of renal origin: Secondary | ICD-10-CM | POA: Diagnosis not present

## 2017-08-07 DIAGNOSIS — N186 End stage renal disease: Secondary | ICD-10-CM | POA: Diagnosis not present

## 2017-08-07 DIAGNOSIS — Z23 Encounter for immunization: Secondary | ICD-10-CM | POA: Diagnosis not present

## 2017-08-07 DIAGNOSIS — E162 Hypoglycemia, unspecified: Secondary | ICD-10-CM | POA: Diagnosis not present

## 2017-08-09 DIAGNOSIS — N186 End stage renal disease: Secondary | ICD-10-CM | POA: Diagnosis not present

## 2017-08-09 DIAGNOSIS — Z23 Encounter for immunization: Secondary | ICD-10-CM | POA: Diagnosis not present

## 2017-08-09 DIAGNOSIS — E162 Hypoglycemia, unspecified: Secondary | ICD-10-CM | POA: Diagnosis not present

## 2017-08-09 DIAGNOSIS — N2581 Secondary hyperparathyroidism of renal origin: Secondary | ICD-10-CM | POA: Diagnosis not present

## 2017-08-11 DIAGNOSIS — N186 End stage renal disease: Secondary | ICD-10-CM | POA: Diagnosis not present

## 2017-08-11 DIAGNOSIS — E162 Hypoglycemia, unspecified: Secondary | ICD-10-CM | POA: Diagnosis not present

## 2017-08-11 DIAGNOSIS — N2581 Secondary hyperparathyroidism of renal origin: Secondary | ICD-10-CM | POA: Diagnosis not present

## 2017-08-11 DIAGNOSIS — Z23 Encounter for immunization: Secondary | ICD-10-CM | POA: Diagnosis not present

## 2017-08-14 DIAGNOSIS — N2581 Secondary hyperparathyroidism of renal origin: Secondary | ICD-10-CM | POA: Diagnosis not present

## 2017-08-14 DIAGNOSIS — E162 Hypoglycemia, unspecified: Secondary | ICD-10-CM | POA: Diagnosis not present

## 2017-08-14 DIAGNOSIS — Z23 Encounter for immunization: Secondary | ICD-10-CM | POA: Diagnosis not present

## 2017-08-14 DIAGNOSIS — N186 End stage renal disease: Secondary | ICD-10-CM | POA: Diagnosis not present

## 2017-08-16 DIAGNOSIS — N2581 Secondary hyperparathyroidism of renal origin: Secondary | ICD-10-CM | POA: Diagnosis not present

## 2017-08-16 DIAGNOSIS — Z23 Encounter for immunization: Secondary | ICD-10-CM | POA: Diagnosis not present

## 2017-08-16 DIAGNOSIS — N186 End stage renal disease: Secondary | ICD-10-CM | POA: Diagnosis not present

## 2017-08-16 DIAGNOSIS — E162 Hypoglycemia, unspecified: Secondary | ICD-10-CM | POA: Diagnosis not present

## 2017-08-18 DIAGNOSIS — E162 Hypoglycemia, unspecified: Secondary | ICD-10-CM | POA: Diagnosis not present

## 2017-08-18 DIAGNOSIS — Z23 Encounter for immunization: Secondary | ICD-10-CM | POA: Diagnosis not present

## 2017-08-18 DIAGNOSIS — N2581 Secondary hyperparathyroidism of renal origin: Secondary | ICD-10-CM | POA: Diagnosis not present

## 2017-08-18 DIAGNOSIS — N186 End stage renal disease: Secondary | ICD-10-CM | POA: Diagnosis not present

## 2017-08-19 DIAGNOSIS — N186 End stage renal disease: Secondary | ICD-10-CM | POA: Diagnosis not present

## 2017-08-19 DIAGNOSIS — Z992 Dependence on renal dialysis: Secondary | ICD-10-CM | POA: Diagnosis not present

## 2017-08-19 DIAGNOSIS — E1122 Type 2 diabetes mellitus with diabetic chronic kidney disease: Secondary | ICD-10-CM | POA: Diagnosis not present

## 2017-08-21 DIAGNOSIS — N186 End stage renal disease: Secondary | ICD-10-CM | POA: Diagnosis not present

## 2017-08-21 DIAGNOSIS — N2581 Secondary hyperparathyroidism of renal origin: Secondary | ICD-10-CM | POA: Diagnosis not present

## 2017-08-21 DIAGNOSIS — E162 Hypoglycemia, unspecified: Secondary | ICD-10-CM | POA: Diagnosis not present

## 2017-08-23 DIAGNOSIS — N186 End stage renal disease: Secondary | ICD-10-CM | POA: Diagnosis not present

## 2017-08-23 DIAGNOSIS — N2581 Secondary hyperparathyroidism of renal origin: Secondary | ICD-10-CM | POA: Diagnosis not present

## 2017-08-23 DIAGNOSIS — E162 Hypoglycemia, unspecified: Secondary | ICD-10-CM | POA: Diagnosis not present

## 2017-08-26 DIAGNOSIS — N2581 Secondary hyperparathyroidism of renal origin: Secondary | ICD-10-CM | POA: Diagnosis not present

## 2017-08-26 DIAGNOSIS — N186 End stage renal disease: Secondary | ICD-10-CM | POA: Diagnosis not present

## 2017-08-26 DIAGNOSIS — E162 Hypoglycemia, unspecified: Secondary | ICD-10-CM | POA: Diagnosis not present

## 2017-08-28 DIAGNOSIS — E162 Hypoglycemia, unspecified: Secondary | ICD-10-CM | POA: Diagnosis not present

## 2017-08-28 DIAGNOSIS — N186 End stage renal disease: Secondary | ICD-10-CM | POA: Diagnosis not present

## 2017-08-28 DIAGNOSIS — N2581 Secondary hyperparathyroidism of renal origin: Secondary | ICD-10-CM | POA: Diagnosis not present

## 2017-08-30 DIAGNOSIS — E162 Hypoglycemia, unspecified: Secondary | ICD-10-CM | POA: Diagnosis not present

## 2017-08-30 DIAGNOSIS — N186 End stage renal disease: Secondary | ICD-10-CM | POA: Diagnosis not present

## 2017-08-30 DIAGNOSIS — N2581 Secondary hyperparathyroidism of renal origin: Secondary | ICD-10-CM | POA: Diagnosis not present

## 2017-09-02 DIAGNOSIS — N2581 Secondary hyperparathyroidism of renal origin: Secondary | ICD-10-CM | POA: Diagnosis not present

## 2017-09-02 DIAGNOSIS — N186 End stage renal disease: Secondary | ICD-10-CM | POA: Diagnosis not present

## 2017-09-02 DIAGNOSIS — E162 Hypoglycemia, unspecified: Secondary | ICD-10-CM | POA: Diagnosis not present

## 2017-09-04 DIAGNOSIS — N2581 Secondary hyperparathyroidism of renal origin: Secondary | ICD-10-CM | POA: Diagnosis not present

## 2017-09-04 DIAGNOSIS — E162 Hypoglycemia, unspecified: Secondary | ICD-10-CM | POA: Diagnosis not present

## 2017-09-04 DIAGNOSIS — N186 End stage renal disease: Secondary | ICD-10-CM | POA: Diagnosis not present

## 2017-09-06 DIAGNOSIS — N2581 Secondary hyperparathyroidism of renal origin: Secondary | ICD-10-CM | POA: Diagnosis not present

## 2017-09-06 DIAGNOSIS — N186 End stage renal disease: Secondary | ICD-10-CM | POA: Diagnosis not present

## 2017-09-06 DIAGNOSIS — E162 Hypoglycemia, unspecified: Secondary | ICD-10-CM | POA: Diagnosis not present

## 2017-09-09 DIAGNOSIS — N2581 Secondary hyperparathyroidism of renal origin: Secondary | ICD-10-CM | POA: Diagnosis not present

## 2017-09-09 DIAGNOSIS — E162 Hypoglycemia, unspecified: Secondary | ICD-10-CM | POA: Diagnosis not present

## 2017-09-09 DIAGNOSIS — N186 End stage renal disease: Secondary | ICD-10-CM | POA: Diagnosis not present

## 2017-09-11 DIAGNOSIS — E162 Hypoglycemia, unspecified: Secondary | ICD-10-CM | POA: Diagnosis not present

## 2017-09-11 DIAGNOSIS — N186 End stage renal disease: Secondary | ICD-10-CM | POA: Diagnosis not present

## 2017-09-11 DIAGNOSIS — N2581 Secondary hyperparathyroidism of renal origin: Secondary | ICD-10-CM | POA: Diagnosis not present

## 2017-09-13 DIAGNOSIS — E162 Hypoglycemia, unspecified: Secondary | ICD-10-CM | POA: Diagnosis not present

## 2017-09-13 DIAGNOSIS — N2581 Secondary hyperparathyroidism of renal origin: Secondary | ICD-10-CM | POA: Diagnosis not present

## 2017-09-13 DIAGNOSIS — N186 End stage renal disease: Secondary | ICD-10-CM | POA: Diagnosis not present

## 2017-09-16 DIAGNOSIS — N2581 Secondary hyperparathyroidism of renal origin: Secondary | ICD-10-CM | POA: Diagnosis not present

## 2017-09-16 DIAGNOSIS — N186 End stage renal disease: Secondary | ICD-10-CM | POA: Diagnosis not present

## 2017-09-16 DIAGNOSIS — E162 Hypoglycemia, unspecified: Secondary | ICD-10-CM | POA: Diagnosis not present

## 2017-09-18 DIAGNOSIS — N186 End stage renal disease: Secondary | ICD-10-CM | POA: Diagnosis not present

## 2017-09-18 DIAGNOSIS — N2581 Secondary hyperparathyroidism of renal origin: Secondary | ICD-10-CM | POA: Diagnosis not present

## 2017-09-18 DIAGNOSIS — E162 Hypoglycemia, unspecified: Secondary | ICD-10-CM | POA: Diagnosis not present

## 2017-09-19 DIAGNOSIS — N186 End stage renal disease: Secondary | ICD-10-CM | POA: Diagnosis not present

## 2017-09-19 DIAGNOSIS — Z992 Dependence on renal dialysis: Secondary | ICD-10-CM | POA: Diagnosis not present

## 2017-09-19 DIAGNOSIS — E1122 Type 2 diabetes mellitus with diabetic chronic kidney disease: Secondary | ICD-10-CM | POA: Diagnosis not present

## 2017-09-20 ENCOUNTER — Encounter: Payer: Self-pay | Admitting: Emergency Medicine

## 2017-09-20 ENCOUNTER — Telehealth: Payer: Self-pay

## 2017-09-20 ENCOUNTER — Emergency Department
Admission: EM | Admit: 2017-09-20 | Discharge: 2017-09-20 | Disposition: A | Discharge status: Home / Self Care | Payer: Medicare Other | Attending: Emergency Medicine | Admitting: Emergency Medicine

## 2017-09-20 ENCOUNTER — Other Ambulatory Visit: Payer: Self-pay

## 2017-09-20 DIAGNOSIS — I132 Hypertensive heart and chronic kidney disease with heart failure and with stage 5 chronic kidney disease, or end stage renal disease: Secondary | ICD-10-CM | POA: Diagnosis not present

## 2017-09-20 DIAGNOSIS — Z7982 Long term (current) use of aspirin: Secondary | ICD-10-CM | POA: Diagnosis not present

## 2017-09-20 DIAGNOSIS — R109 Unspecified abdominal pain: Secondary | ICD-10-CM | POA: Diagnosis not present

## 2017-09-20 DIAGNOSIS — R112 Nausea with vomiting, unspecified: Secondary | ICD-10-CM | POA: Diagnosis not present

## 2017-09-20 DIAGNOSIS — Z87891 Personal history of nicotine dependence: Secondary | ICD-10-CM | POA: Diagnosis not present

## 2017-09-20 DIAGNOSIS — Z992 Dependence on renal dialysis: Secondary | ICD-10-CM | POA: Insufficient documentation

## 2017-09-20 DIAGNOSIS — E1122 Type 2 diabetes mellitus with diabetic chronic kidney disease: Secondary | ICD-10-CM | POA: Insufficient documentation

## 2017-09-20 DIAGNOSIS — I5022 Chronic systolic (congestive) heart failure: Secondary | ICD-10-CM | POA: Insufficient documentation

## 2017-09-20 DIAGNOSIS — N2581 Secondary hyperparathyroidism of renal origin: Secondary | ICD-10-CM | POA: Diagnosis not present

## 2017-09-20 DIAGNOSIS — R197 Diarrhea, unspecified: Secondary | ICD-10-CM

## 2017-09-20 DIAGNOSIS — I2510 Atherosclerotic heart disease of native coronary artery without angina pectoris: Secondary | ICD-10-CM | POA: Insufficient documentation

## 2017-09-20 DIAGNOSIS — Z8673 Personal history of transient ischemic attack (TIA), and cerebral infarction without residual deficits: Secondary | ICD-10-CM | POA: Insufficient documentation

## 2017-09-20 DIAGNOSIS — N186 End stage renal disease: Secondary | ICD-10-CM | POA: Insufficient documentation

## 2017-09-20 DIAGNOSIS — R110 Nausea: Secondary | ICD-10-CM

## 2017-09-20 DIAGNOSIS — D631 Anemia in chronic kidney disease: Secondary | ICD-10-CM | POA: Diagnosis not present

## 2017-09-20 LAB — CBC
HCT: 41.3 % (ref 40.0–52.0)
HEMOGLOBIN: 13.5 g/dL (ref 13.0–18.0)
MCH: 30.3 pg (ref 26.0–34.0)
MCHC: 32.8 g/dL (ref 32.0–36.0)
MCV: 92.3 fL (ref 80.0–100.0)
PLATELETS: 197 10*3/uL (ref 150–440)
RBC: 4.47 MIL/uL (ref 4.40–5.90)
RDW: 13.7 % (ref 11.5–14.5)
WBC: 14.8 10*3/uL — AB (ref 3.8–10.6)

## 2017-09-20 LAB — COMPREHENSIVE METABOLIC PANEL
ALK PHOS: 114 U/L (ref 38–126)
ALT: 11 U/L — AB (ref 17–63)
ANION GAP: 17 — AB (ref 5–15)
AST: 16 U/L (ref 15–41)
Albumin: 3.7 g/dL (ref 3.5–5.0)
BILIRUBIN TOTAL: 1.5 mg/dL — AB (ref 0.3–1.2)
BUN: 15 mg/dL (ref 6–20)
CALCIUM: 8.9 mg/dL (ref 8.9–10.3)
CO2: 30 mmol/L (ref 22–32)
CREATININE: 4.19 mg/dL — AB (ref 0.61–1.24)
Chloride: 93 mmol/L — ABNORMAL LOW (ref 101–111)
GFR, EST AFRICAN AMERICAN: 16 mL/min — AB (ref >60)
GFR, EST NON AFRICAN AMERICAN: 14 mL/min — AB (ref >60)
Glucose, Bld: 102 mg/dL — ABNORMAL HIGH (ref 65–99)
Potassium: 3.7 mmol/L (ref 3.5–5.1)
Sodium: 140 mmol/L (ref 135–145)
TOTAL PROTEIN: 8.6 g/dL — AB (ref 6.5–8.1)

## 2017-09-20 LAB — LIPASE, BLOOD: Lipase: 21 U/L (ref 11–51)

## 2017-09-20 MED ORDER — DICYCLOMINE HCL 20 MG PO TABS: 20.0000 mg | ORAL_TABLET | Freq: Three times a day (TID) | ORAL | 0 refills | Status: AC | PRN

## 2017-09-20 MED ORDER — DICYCLOMINE HCL 10 MG PO CAPS
10.0000 mg | ORAL_CAPSULE | Freq: Once | ORAL | Status: AC
  Administered 2017-09-20: 10 mg via ORAL
  Filled 2017-09-20: qty 1

## 2017-09-20 MED ORDER — ONDANSETRON HCL 4 MG PO TABS: 4.0000 mg | ORAL_TABLET | Freq: Three times a day (TID) | ORAL | 0 refills | Status: AC | PRN

## 2017-09-20 MED ORDER — ONDANSETRON HCL 4 MG PO TABS
4.0000 mg | ORAL_TABLET | Freq: Once | ORAL | Status: AC
  Administered 2017-09-20: 4 mg via ORAL
  Filled 2017-09-20: qty 1

## 2017-09-20 NOTE — ED Provider Notes (Signed)
[facility] Emergency Department Provider Note  ____________________________________________   I have reviewed the triage vital signs and the nursing notes.   HISTORY  Chief Complaint Diarrhea   History limited by: Not Limited   HPI Luis Walter is a 67 y.o. male who presents to the emergency department today at the request of his PCP office because of concern for c. Dif. Wife states that patient has history of c.dif. Starting 3 days ago the patient developed diarrhea. Wife states that it is quite watery. The patient has also been having associated nausea and vomiting. The patient states that he has had associated decreased oral intake during this time. Some abdominal discomfort. No fevers.   Per medical record review patient has a history of ESRD  Past Medical History:  Diagnosis Date  . CAD (coronary artery disease)    a. cardiac cath 2014: LM nl, mLAD 30%, dLAD 30%, ostLCx 50%, mid ramus 20% OM3 80% pRCA 20%, mRCA 20%, PDA 100% w/ left to right collats, medically managed  b.10/2015: NSTEMI   . Chronic systolic CHF (congestive heart failure) ([place])    a. echo 12/2014: EF 35-40%, mod diffuse HK, RWMA cannot be excluded, LV diastolic fxn parameters nl, mild MR, moderately dilated LA, mildly dilated RV internal cavity size, mild TR, PASP nl  . Dialysis patient ([place])   . DM2 (diabetes mellitus, type 2) ([place])   . ESRD (end stage renal disease) ([place])    a. on HD M,W,F; b. previously on transplant list at [facility] - removed 2016  . Gallstones   . Stroke [facility])     Patient Active Problem List   Diagnosis Date Noted  . Vitamin D deficiency 01/24/2017  . Nodular acne 10/18/2016  . Hyperlipidemia associated with type 2 diabetes mellitus ([place]) 10/18/2016  . Diarrhea 10/23/2015  . History of non-ST elevation myocardial infarction (NSTEMI) 10/20/2015  . Allergic rhinitis 07/07/2015  . Palpitations 03/30/2015  . Neck pain 03/30/2015  . Cervical pain 03/30/2015  .  Cephalalgia 03/17/2015  . Type 2 diabetes, controlled, with renal manifestation ([place]) 03/15/2015  . Benign hypertension with ESRD (end-stage renal disease) ([place]) 03/15/2015  . Generalized weakness 03/15/2015  . Chronic systolic CHF (congestive heart failure) ([place])   . CAD (coronary artery disease)   . ESRD on dialysis ([place]) 12/25/2014  . History of CVA (cerebrovascular accident) 12/25/2014  . Personal history of transient ischemic attack (TIA), and cerebral infarction without residual deficits 12/25/2014  . Abnormal echocardiogram 12/25/2012  . Awaiting organ transplant status 08/26/2012    Past Surgical History:  Procedure Laterality Date  . AV FISTULA PLACEMENT Left   . COLONOSCOPY WITH PROPOFOL N/A 05/31/2015   Procedure: COLONOSCOPY WITH PROPOFOL;  Surgeon: Lollie Sails, MD;  [facility];  Service: Endoscopy;  Laterality: N/A;  . ESOPHAGOGASTRODUODENOSCOPY (EGD) WITH PROPOFOL N/A 05/31/2015   Procedure: ESOPHAGOGASTRODUODENOSCOPY (EGD) WITH PROPOFOL;  Surgeon: Lollie Sails, MD;  Location: [facility] ENDOSCOPY;  Service: Endoscopy;  Laterality: N/A;    Prior to Admission medications   Medication Sig Start Date End Date Taking? Authorizing Provider  aspirin EC 81 MG tablet Take 81 mg by mouth at bedtime.    [provider]  atorvastatin (LIPITOR) 40 MG tablet TAKE 1 TABLET BY MOUTH  DAILY AT 6 PM 06/25/17   Karamalegos, Devonne Doughty, DO  cinacalcet (SENSIPAR) 30 MG tablet Take 30 mg by mouth daily.    [provider]  fluticasone (FLONASE) 50 MCG/ACT nasal spray Place 2 sprays into  both nostrils daily. 11/10/15   Luciana Axe, NP  loratadine (CLARITIN) 10 MG tablet Take 1 tablet (10 mg total) daily by mouth. 07/02/17   Karamalegos, Devonne Doughty, DO  metoprolol tartrate (LOPRESSOR) 25 MG tablet Take 0.5-1 tablets (12.5-25 mg total) at bedtime by mouth. 07/02/17   Karamalegos, Devonne Doughty, DO  Nutritional Supplements (FEEDING SUPPLEMENT, NEPRO CARB  STEADY,) LIQD Take 237 mLs by mouth 2 (two) times daily between meals. 10/23/15   Theodoro Grist, MD  sevelamer carbonate (RENVELA) 800 MG tablet Take 3,200 mg by mouth 3 (three) times daily with meals.    [provider]    Allergies Patient has no known allergies.  Family History  Problem Relation Age of Onset  . Hypertension Unknown   . Diabetes Unknown   . Diabetes Mother   . Diabetes Father   . Hypertension Father     Social History Social History   Tobacco Use  . Smoking status: Former Smoker    Last attempt to quit: 08/20/2006    Years since quitting: 11.0  . Smokeless tobacco: Never Used  . Tobacco comment: estimated quit date   Substance Use Topics  . Alcohol use: No    Alcohol/week: 0.0 oz  . Drug use: No    Review of Systems Constitutional: No fever/chills Eyes: No visual changes. ENT: No sore throat. Cardiovascular: Denies chest pain. Respiratory: Denies shortness of breath. Gastrointestinal: Positive for abdominal pain. Nausea and diarrhea.  Genitourinary: Negative for dysuria. Musculoskeletal: Negative for back pain. Skin: Negative for rash. Neurological: Negative for headaches, focal weakness or numbness.  ____________________________________________   PHYSICAL EXAM:  VITAL SIGNS: ED Triage Vitals  Enc Vitals Group     BP 09/20/17 1143 104/89     Pulse Rate 09/20/17 1143 89     Resp 09/20/17 1143 16     Temp 09/20/17 1143 98.4 F (36.9 C)     Temp Source 09/20/17 1143 Oral     SpO2 09/20/17 1143 97 %     Weight 09/20/17 1144 190 lb (86.2 kg)     Height 09/20/17 1144 5\' 8"  (1.727 m)   Constitutional: Alert and oriented. Well appearing and in no distress. Eyes: Conjunctivae are normal.  ENT   Head: Normocephalic and atraumatic.   Nose: No congestion/rhinnorhea.   Mouth/Throat: Mucous membranes are moist.   Neck: No stridor. Hematological/Lymphatic/Immunilogical: No cervical lymphadenopathy. Cardiovascular: Normal rate,  regular rhythm.  No murmurs, rubs, or gallops.  Respiratory: Normal respiratory effort without tachypnea nor retractions. Breath sounds are clear and equal bilaterally. No wheezes/rales/rhonchi. Gastrointestinal: Soft and non tender. No rebound. No guarding.  Genitourinary: Deferred Musculoskeletal: Normal range of motion in all extremities. No lower extremity edema. Neurologic:  Normal speech and language. No gross focal neurologic deficits are appreciated.  Skin:  Skin is warm, dry and intact. No rash noted. Psychiatric: Mood and affect are normal. Speech and behavior are normal. Patient exhibits appropriate insight and judgment.  ____________________________________________    LABS (pertinent positives/negatives)  Lipase 21 CBC wbc 14.8, hgb 13.5, plt 197 CMP na 140, k 3.7, cr 4.19 ____________________________________________   EKG  None  ____________________________________________    RADIOLOGY  None  ____________________________________________   PROCEDURES  Procedures  ____________________________________________   INITIAL IMPRESSION / ASSESSMENT AND PLAN / ED COURSE  Pertinent labs & imaging results that were available during my care of the patient were reviewed by me and considered in my medical decision making (see chart for details).  Presented to the emergency department  today at the request of primary care physician's office because of concerns for possible stream difficile.  Patient was in the emergency department for over 4 hours without producing a stool sample.  This point I think C. difficile less likely.  I did have a discussion with patient and family that primary care doctor could always order a C. difficile test and patient could do stool testing as an outpatient if it continue to be an issue.  However given lack of continued diarrhea I think viral gastroenteritis perhaps more likely.  Blood work without any concerning signs of infection which could  possibly suggest significant intra-abdominal infection.  Patient appears well on exam.  He was given Bentyl and Zofran with good relief of symptoms and patient was able to tolerate p.o. afterwards.  Discussed with patient importance of primary care follow-up.  ____________________________________________   FINAL CLINICAL IMPRESSION(S) / ED DIAGNOSES  Final diagnoses:  Nausea  Diarrhea, unspecified type     Note: This dictation was prepared with Dragon dictation. Any transcriptional errors that result from this process are unintentional     Nance Pear, MD 09/20/17 [phone]

## 2017-09-20 NOTE — Discharge Instructions (Signed)
Please seek medical attention for any high fevers, chest pain, shortness of breath, change in behavior, persistent vomiting, bloody stool or any other new or concerning symptoms.  

## 2017-09-20 NOTE — ED Notes (Signed)
Pt offered graham crackers and water for PO challenge.

## 2017-09-20 NOTE — ED Notes (Signed)
Pt presents via POV for diarrhea for 3 days. Son is at home stomach virus. Pt is NAD at this time.

## 2017-09-20 NOTE — ED Triage Notes (Signed)
Pt in via POV with complaints of N/V/D since Wednesday.  Wife reports hx of cdiff, reports similar smell.  Per wife, PCP contacted and advised pt to be seen here because we can provide quicker results.  Pt denies any pain.  Vitals WDL.

## 2017-09-20 NOTE — Telephone Encounter (Signed)
Pt's spouse called regarding his sxs of diarrhea and throwing up onset Wednesday and has watery stool and smell like in past when he had c-diff wife was concerned and as per Dr. Raliegh Ip she can get evaluated by hospital where they can do stat C-diff test. Patient spouse understand very well.

## 2017-09-20 NOTE — ED Notes (Signed)
RN offered toileting pt denied at this time.

## 2017-09-20 NOTE — ED Notes (Signed)
Signature pad not working at time of discharge; pt expresses verbal understanding of discharge paper work with no further questions at this time.

## 2017-09-23 DIAGNOSIS — N186 End stage renal disease: Secondary | ICD-10-CM | POA: Diagnosis not present

## 2017-09-23 DIAGNOSIS — N2581 Secondary hyperparathyroidism of renal origin: Secondary | ICD-10-CM | POA: Diagnosis not present

## 2017-09-23 DIAGNOSIS — D631 Anemia in chronic kidney disease: Secondary | ICD-10-CM | POA: Diagnosis not present

## 2017-09-25 DIAGNOSIS — N2581 Secondary hyperparathyroidism of renal origin: Secondary | ICD-10-CM | POA: Diagnosis not present

## 2017-09-25 DIAGNOSIS — N186 End stage renal disease: Secondary | ICD-10-CM | POA: Diagnosis not present

## 2017-09-25 DIAGNOSIS — D631 Anemia in chronic kidney disease: Secondary | ICD-10-CM | POA: Diagnosis not present

## 2017-09-27 DIAGNOSIS — N2581 Secondary hyperparathyroidism of renal origin: Secondary | ICD-10-CM | POA: Diagnosis not present

## 2017-09-27 DIAGNOSIS — D631 Anemia in chronic kidney disease: Secondary | ICD-10-CM | POA: Diagnosis not present

## 2017-09-27 DIAGNOSIS — N186 End stage renal disease: Secondary | ICD-10-CM | POA: Diagnosis not present

## 2017-09-30 DIAGNOSIS — N2581 Secondary hyperparathyroidism of renal origin: Secondary | ICD-10-CM | POA: Diagnosis not present

## 2017-09-30 DIAGNOSIS — D631 Anemia in chronic kidney disease: Secondary | ICD-10-CM | POA: Diagnosis not present

## 2017-09-30 DIAGNOSIS — N186 End stage renal disease: Secondary | ICD-10-CM | POA: Diagnosis not present

## 2017-10-02 DIAGNOSIS — D631 Anemia in chronic kidney disease: Secondary | ICD-10-CM | POA: Diagnosis not present

## 2017-10-02 DIAGNOSIS — N2581 Secondary hyperparathyroidism of renal origin: Secondary | ICD-10-CM | POA: Diagnosis not present

## 2017-10-02 DIAGNOSIS — N186 End stage renal disease: Secondary | ICD-10-CM | POA: Diagnosis not present

## 2017-10-04 DIAGNOSIS — N2581 Secondary hyperparathyroidism of renal origin: Secondary | ICD-10-CM | POA: Diagnosis not present

## 2017-10-04 DIAGNOSIS — N186 End stage renal disease: Secondary | ICD-10-CM | POA: Diagnosis not present

## 2017-10-04 DIAGNOSIS — D631 Anemia in chronic kidney disease: Secondary | ICD-10-CM | POA: Diagnosis not present

## 2017-10-07 DIAGNOSIS — N186 End stage renal disease: Secondary | ICD-10-CM | POA: Diagnosis not present

## 2017-10-07 DIAGNOSIS — D631 Anemia in chronic kidney disease: Secondary | ICD-10-CM | POA: Diagnosis not present

## 2017-10-07 DIAGNOSIS — N2581 Secondary hyperparathyroidism of renal origin: Secondary | ICD-10-CM | POA: Diagnosis not present

## 2017-10-09 DIAGNOSIS — N186 End stage renal disease: Secondary | ICD-10-CM | POA: Diagnosis not present

## 2017-10-09 DIAGNOSIS — D631 Anemia in chronic kidney disease: Secondary | ICD-10-CM | POA: Diagnosis not present

## 2017-10-09 DIAGNOSIS — N2581 Secondary hyperparathyroidism of renal origin: Secondary | ICD-10-CM | POA: Diagnosis not present

## 2017-10-11 DIAGNOSIS — D631 Anemia in chronic kidney disease: Secondary | ICD-10-CM | POA: Diagnosis not present

## 2017-10-11 DIAGNOSIS — N2581 Secondary hyperparathyroidism of renal origin: Secondary | ICD-10-CM | POA: Diagnosis not present

## 2017-10-11 DIAGNOSIS — N186 End stage renal disease: Secondary | ICD-10-CM | POA: Diagnosis not present

## 2017-10-14 DIAGNOSIS — N186 End stage renal disease: Secondary | ICD-10-CM | POA: Diagnosis not present

## 2017-10-14 DIAGNOSIS — D631 Anemia in chronic kidney disease: Secondary | ICD-10-CM | POA: Diagnosis not present

## 2017-10-14 DIAGNOSIS — N2581 Secondary hyperparathyroidism of renal origin: Secondary | ICD-10-CM | POA: Diagnosis not present

## 2017-10-16 DIAGNOSIS — D631 Anemia in chronic kidney disease: Secondary | ICD-10-CM | POA: Diagnosis not present

## 2017-10-16 DIAGNOSIS — N186 End stage renal disease: Secondary | ICD-10-CM | POA: Diagnosis not present

## 2017-10-16 DIAGNOSIS — N2581 Secondary hyperparathyroidism of renal origin: Secondary | ICD-10-CM | POA: Diagnosis not present

## 2017-10-17 DIAGNOSIS — N186 End stage renal disease: Secondary | ICD-10-CM | POA: Diagnosis not present

## 2017-10-17 DIAGNOSIS — E1122 Type 2 diabetes mellitus with diabetic chronic kidney disease: Secondary | ICD-10-CM | POA: Diagnosis not present

## 2017-10-17 DIAGNOSIS — Z992 Dependence on renal dialysis: Secondary | ICD-10-CM | POA: Diagnosis not present

## 2017-10-18 DIAGNOSIS — D509 Iron deficiency anemia, unspecified: Secondary | ICD-10-CM | POA: Diagnosis not present

## 2017-10-18 DIAGNOSIS — N2581 Secondary hyperparathyroidism of renal origin: Secondary | ICD-10-CM | POA: Diagnosis not present

## 2017-10-18 DIAGNOSIS — D631 Anemia in chronic kidney disease: Secondary | ICD-10-CM | POA: Diagnosis not present

## 2017-10-18 DIAGNOSIS — N186 End stage renal disease: Secondary | ICD-10-CM | POA: Diagnosis not present

## 2017-10-21 DIAGNOSIS — D631 Anemia in chronic kidney disease: Secondary | ICD-10-CM | POA: Diagnosis not present

## 2017-10-21 DIAGNOSIS — N186 End stage renal disease: Secondary | ICD-10-CM | POA: Diagnosis not present

## 2017-10-21 DIAGNOSIS — D509 Iron deficiency anemia, unspecified: Secondary | ICD-10-CM | POA: Diagnosis not present

## 2017-10-21 DIAGNOSIS — N2581 Secondary hyperparathyroidism of renal origin: Secondary | ICD-10-CM | POA: Diagnosis not present

## 2017-10-23 DIAGNOSIS — D631 Anemia in chronic kidney disease: Secondary | ICD-10-CM | POA: Diagnosis not present

## 2017-10-23 DIAGNOSIS — N2581 Secondary hyperparathyroidism of renal origin: Secondary | ICD-10-CM | POA: Diagnosis not present

## 2017-10-23 DIAGNOSIS — N186 End stage renal disease: Secondary | ICD-10-CM | POA: Diagnosis not present

## 2017-10-23 DIAGNOSIS — D509 Iron deficiency anemia, unspecified: Secondary | ICD-10-CM | POA: Diagnosis not present

## 2017-10-25 DIAGNOSIS — N2581 Secondary hyperparathyroidism of renal origin: Secondary | ICD-10-CM | POA: Diagnosis not present

## 2017-10-25 DIAGNOSIS — D509 Iron deficiency anemia, unspecified: Secondary | ICD-10-CM | POA: Diagnosis not present

## 2017-10-25 DIAGNOSIS — D631 Anemia in chronic kidney disease: Secondary | ICD-10-CM | POA: Diagnosis not present

## 2017-10-25 DIAGNOSIS — N186 End stage renal disease: Secondary | ICD-10-CM | POA: Diagnosis not present

## 2017-10-28 DIAGNOSIS — D631 Anemia in chronic kidney disease: Secondary | ICD-10-CM | POA: Diagnosis not present

## 2017-10-28 DIAGNOSIS — N186 End stage renal disease: Secondary | ICD-10-CM | POA: Diagnosis not present

## 2017-10-28 DIAGNOSIS — N2581 Secondary hyperparathyroidism of renal origin: Secondary | ICD-10-CM | POA: Diagnosis not present

## 2017-10-28 DIAGNOSIS — D509 Iron deficiency anemia, unspecified: Secondary | ICD-10-CM | POA: Diagnosis not present

## 2017-10-30 DIAGNOSIS — N186 End stage renal disease: Secondary | ICD-10-CM | POA: Diagnosis not present

## 2017-10-30 DIAGNOSIS — D509 Iron deficiency anemia, unspecified: Secondary | ICD-10-CM | POA: Diagnosis not present

## 2017-10-30 DIAGNOSIS — D631 Anemia in chronic kidney disease: Secondary | ICD-10-CM | POA: Diagnosis not present

## 2017-10-30 DIAGNOSIS — N2581 Secondary hyperparathyroidism of renal origin: Secondary | ICD-10-CM | POA: Diagnosis not present

## 2017-11-01 DIAGNOSIS — N2581 Secondary hyperparathyroidism of renal origin: Secondary | ICD-10-CM | POA: Diagnosis not present

## 2017-11-01 DIAGNOSIS — D509 Iron deficiency anemia, unspecified: Secondary | ICD-10-CM | POA: Diagnosis not present

## 2017-11-01 DIAGNOSIS — D631 Anemia in chronic kidney disease: Secondary | ICD-10-CM | POA: Diagnosis not present

## 2017-11-01 DIAGNOSIS — N186 End stage renal disease: Secondary | ICD-10-CM | POA: Diagnosis not present

## 2017-11-04 DIAGNOSIS — D509 Iron deficiency anemia, unspecified: Secondary | ICD-10-CM | POA: Diagnosis not present

## 2017-11-04 DIAGNOSIS — N2581 Secondary hyperparathyroidism of renal origin: Secondary | ICD-10-CM | POA: Diagnosis not present

## 2017-11-04 DIAGNOSIS — D631 Anemia in chronic kidney disease: Secondary | ICD-10-CM | POA: Diagnosis not present

## 2017-11-04 DIAGNOSIS — N186 End stage renal disease: Secondary | ICD-10-CM | POA: Diagnosis not present

## 2017-11-06 DIAGNOSIS — D631 Anemia in chronic kidney disease: Secondary | ICD-10-CM | POA: Diagnosis not present

## 2017-11-06 DIAGNOSIS — N186 End stage renal disease: Secondary | ICD-10-CM | POA: Diagnosis not present

## 2017-11-06 DIAGNOSIS — N2581 Secondary hyperparathyroidism of renal origin: Secondary | ICD-10-CM | POA: Diagnosis not present

## 2017-11-06 DIAGNOSIS — D509 Iron deficiency anemia, unspecified: Secondary | ICD-10-CM | POA: Diagnosis not present

## 2017-11-08 DIAGNOSIS — D631 Anemia in chronic kidney disease: Secondary | ICD-10-CM | POA: Diagnosis not present

## 2017-11-08 DIAGNOSIS — D509 Iron deficiency anemia, unspecified: Secondary | ICD-10-CM | POA: Diagnosis not present

## 2017-11-08 DIAGNOSIS — N2581 Secondary hyperparathyroidism of renal origin: Secondary | ICD-10-CM | POA: Diagnosis not present

## 2017-11-08 DIAGNOSIS — N186 End stage renal disease: Secondary | ICD-10-CM | POA: Diagnosis not present

## 2017-11-11 ENCOUNTER — Other Ambulatory Visit (INDEPENDENT_AMBULATORY_CARE_PROVIDER_SITE_OTHER): Payer: Self-pay | Admitting: Vascular Surgery

## 2017-11-11 DIAGNOSIS — N2581 Secondary hyperparathyroidism of renal origin: Secondary | ICD-10-CM | POA: Diagnosis not present

## 2017-11-11 DIAGNOSIS — N186 End stage renal disease: Secondary | ICD-10-CM | POA: Diagnosis not present

## 2017-11-11 DIAGNOSIS — D509 Iron deficiency anemia, unspecified: Secondary | ICD-10-CM | POA: Diagnosis not present

## 2017-11-11 DIAGNOSIS — N185 Chronic kidney disease, stage 5: Secondary | ICD-10-CM

## 2017-11-11 DIAGNOSIS — D631 Anemia in chronic kidney disease: Secondary | ICD-10-CM | POA: Diagnosis not present

## 2017-11-12 ENCOUNTER — Encounter (INDEPENDENT_AMBULATORY_CARE_PROVIDER_SITE_OTHER): Payer: Medicare Other

## 2017-11-12 ENCOUNTER — Encounter (INDEPENDENT_AMBULATORY_CARE_PROVIDER_SITE_OTHER): Payer: Medicare Other | Admitting: Vascular Surgery

## 2017-11-13 DIAGNOSIS — N2581 Secondary hyperparathyroidism of renal origin: Secondary | ICD-10-CM | POA: Diagnosis not present

## 2017-11-13 DIAGNOSIS — N186 End stage renal disease: Secondary | ICD-10-CM | POA: Diagnosis not present

## 2017-11-13 DIAGNOSIS — D631 Anemia in chronic kidney disease: Secondary | ICD-10-CM | POA: Diagnosis not present

## 2017-11-13 DIAGNOSIS — D509 Iron deficiency anemia, unspecified: Secondary | ICD-10-CM | POA: Diagnosis not present

## 2017-11-15 DIAGNOSIS — N186 End stage renal disease: Secondary | ICD-10-CM | POA: Diagnosis not present

## 2017-11-15 DIAGNOSIS — D509 Iron deficiency anemia, unspecified: Secondary | ICD-10-CM | POA: Diagnosis not present

## 2017-11-15 DIAGNOSIS — N2581 Secondary hyperparathyroidism of renal origin: Secondary | ICD-10-CM | POA: Diagnosis not present

## 2017-11-15 DIAGNOSIS — D631 Anemia in chronic kidney disease: Secondary | ICD-10-CM | POA: Diagnosis not present

## 2017-11-18 DIAGNOSIS — N186 End stage renal disease: Secondary | ICD-10-CM | POA: Diagnosis not present

## 2017-11-18 DIAGNOSIS — D631 Anemia in chronic kidney disease: Secondary | ICD-10-CM | POA: Diagnosis not present

## 2017-11-18 DIAGNOSIS — N2581 Secondary hyperparathyroidism of renal origin: Secondary | ICD-10-CM | POA: Diagnosis not present

## 2017-11-18 DIAGNOSIS — D509 Iron deficiency anemia, unspecified: Secondary | ICD-10-CM | POA: Diagnosis not present

## 2017-11-20 DIAGNOSIS — D631 Anemia in chronic kidney disease: Secondary | ICD-10-CM | POA: Diagnosis not present

## 2017-11-20 DIAGNOSIS — N2581 Secondary hyperparathyroidism of renal origin: Secondary | ICD-10-CM | POA: Diagnosis not present

## 2017-11-20 DIAGNOSIS — D509 Iron deficiency anemia, unspecified: Secondary | ICD-10-CM | POA: Diagnosis not present

## 2017-11-20 DIAGNOSIS — N186 End stage renal disease: Secondary | ICD-10-CM | POA: Diagnosis not present

## 2017-11-22 DIAGNOSIS — D509 Iron deficiency anemia, unspecified: Secondary | ICD-10-CM | POA: Diagnosis not present

## 2017-11-22 DIAGNOSIS — N2581 Secondary hyperparathyroidism of renal origin: Secondary | ICD-10-CM | POA: Diagnosis not present

## 2017-11-22 DIAGNOSIS — N186 End stage renal disease: Secondary | ICD-10-CM | POA: Diagnosis not present

## 2017-11-22 DIAGNOSIS — D631 Anemia in chronic kidney disease: Secondary | ICD-10-CM | POA: Diagnosis not present

## 2017-11-25 DIAGNOSIS — N186 End stage renal disease: Secondary | ICD-10-CM | POA: Diagnosis not present

## 2017-11-25 DIAGNOSIS — D509 Iron deficiency anemia, unspecified: Secondary | ICD-10-CM | POA: Diagnosis not present

## 2017-11-25 DIAGNOSIS — D631 Anemia in chronic kidney disease: Secondary | ICD-10-CM | POA: Diagnosis not present

## 2017-11-25 DIAGNOSIS — N2581 Secondary hyperparathyroidism of renal origin: Secondary | ICD-10-CM | POA: Diagnosis not present

## 2017-11-26 DIAGNOSIS — Z01818 Encounter for other preprocedural examination: Secondary | ICD-10-CM | POA: Diagnosis not present

## 2017-11-26 DIAGNOSIS — N186 End stage renal disease: Secondary | ICD-10-CM | POA: Diagnosis not present

## 2017-11-26 LAB — PSA: PSA: 0.85

## 2017-11-27 DIAGNOSIS — D631 Anemia in chronic kidney disease: Secondary | ICD-10-CM | POA: Diagnosis not present

## 2017-11-27 DIAGNOSIS — D509 Iron deficiency anemia, unspecified: Secondary | ICD-10-CM | POA: Diagnosis not present

## 2017-11-27 DIAGNOSIS — N2581 Secondary hyperparathyroidism of renal origin: Secondary | ICD-10-CM | POA: Diagnosis not present

## 2017-11-27 DIAGNOSIS — N186 End stage renal disease: Secondary | ICD-10-CM | POA: Diagnosis not present

## 2017-11-29 DIAGNOSIS — D509 Iron deficiency anemia, unspecified: Secondary | ICD-10-CM | POA: Diagnosis not present

## 2017-11-29 DIAGNOSIS — N2581 Secondary hyperparathyroidism of renal origin: Secondary | ICD-10-CM | POA: Diagnosis not present

## 2017-11-29 DIAGNOSIS — N186 End stage renal disease: Secondary | ICD-10-CM | POA: Diagnosis not present

## 2017-11-29 DIAGNOSIS — D631 Anemia in chronic kidney disease: Secondary | ICD-10-CM | POA: Diagnosis not present

## 2017-12-02 DIAGNOSIS — D509 Iron deficiency anemia, unspecified: Secondary | ICD-10-CM | POA: Diagnosis not present

## 2017-12-02 DIAGNOSIS — N2581 Secondary hyperparathyroidism of renal origin: Secondary | ICD-10-CM | POA: Diagnosis not present

## 2017-12-02 DIAGNOSIS — D631 Anemia in chronic kidney disease: Secondary | ICD-10-CM | POA: Diagnosis not present

## 2017-12-02 DIAGNOSIS — N186 End stage renal disease: Secondary | ICD-10-CM | POA: Diagnosis not present

## 2017-12-04 DIAGNOSIS — D509 Iron deficiency anemia, unspecified: Secondary | ICD-10-CM | POA: Diagnosis not present

## 2017-12-04 DIAGNOSIS — D631 Anemia in chronic kidney disease: Secondary | ICD-10-CM | POA: Diagnosis not present

## 2017-12-04 DIAGNOSIS — N2581 Secondary hyperparathyroidism of renal origin: Secondary | ICD-10-CM | POA: Diagnosis not present

## 2017-12-04 DIAGNOSIS — N186 End stage renal disease: Secondary | ICD-10-CM | POA: Diagnosis not present

## 2017-12-06 DIAGNOSIS — D509 Iron deficiency anemia, unspecified: Secondary | ICD-10-CM | POA: Diagnosis not present

## 2017-12-06 DIAGNOSIS — N186 End stage renal disease: Secondary | ICD-10-CM | POA: Diagnosis not present

## 2017-12-06 DIAGNOSIS — D631 Anemia in chronic kidney disease: Secondary | ICD-10-CM | POA: Diagnosis not present

## 2017-12-06 DIAGNOSIS — N2581 Secondary hyperparathyroidism of renal origin: Secondary | ICD-10-CM | POA: Diagnosis not present

## 2017-12-09 DIAGNOSIS — D509 Iron deficiency anemia, unspecified: Secondary | ICD-10-CM | POA: Diagnosis not present

## 2017-12-09 DIAGNOSIS — N186 End stage renal disease: Secondary | ICD-10-CM | POA: Diagnosis not present

## 2017-12-09 DIAGNOSIS — D631 Anemia in chronic kidney disease: Secondary | ICD-10-CM | POA: Diagnosis not present

## 2017-12-09 DIAGNOSIS — N2581 Secondary hyperparathyroidism of renal origin: Secondary | ICD-10-CM | POA: Diagnosis not present

## 2017-12-11 DIAGNOSIS — N186 End stage renal disease: Secondary | ICD-10-CM | POA: Diagnosis not present

## 2017-12-11 DIAGNOSIS — D509 Iron deficiency anemia, unspecified: Secondary | ICD-10-CM | POA: Diagnosis not present

## 2017-12-11 DIAGNOSIS — N2581 Secondary hyperparathyroidism of renal origin: Secondary | ICD-10-CM | POA: Diagnosis not present

## 2017-12-11 DIAGNOSIS — D631 Anemia in chronic kidney disease: Secondary | ICD-10-CM | POA: Diagnosis not present

## 2017-12-13 DIAGNOSIS — D509 Iron deficiency anemia, unspecified: Secondary | ICD-10-CM | POA: Diagnosis not present

## 2017-12-13 DIAGNOSIS — N2581 Secondary hyperparathyroidism of renal origin: Secondary | ICD-10-CM | POA: Diagnosis not present

## 2017-12-13 DIAGNOSIS — N186 End stage renal disease: Secondary | ICD-10-CM | POA: Diagnosis not present

## 2017-12-13 DIAGNOSIS — D631 Anemia in chronic kidney disease: Secondary | ICD-10-CM | POA: Diagnosis not present

## 2017-12-16 DIAGNOSIS — D509 Iron deficiency anemia, unspecified: Secondary | ICD-10-CM | POA: Diagnosis not present

## 2017-12-16 DIAGNOSIS — D631 Anemia in chronic kidney disease: Secondary | ICD-10-CM | POA: Diagnosis not present

## 2017-12-16 DIAGNOSIS — N2581 Secondary hyperparathyroidism of renal origin: Secondary | ICD-10-CM | POA: Diagnosis not present

## 2017-12-16 DIAGNOSIS — N186 End stage renal disease: Secondary | ICD-10-CM | POA: Diagnosis not present

## 2017-12-17 DIAGNOSIS — E1122 Type 2 diabetes mellitus with diabetic chronic kidney disease: Secondary | ICD-10-CM | POA: Diagnosis not present

## 2017-12-17 DIAGNOSIS — Z992 Dependence on renal dialysis: Secondary | ICD-10-CM | POA: Diagnosis not present

## 2017-12-17 DIAGNOSIS — N186 End stage renal disease: Secondary | ICD-10-CM | POA: Diagnosis not present

## 2017-12-18 DIAGNOSIS — N186 End stage renal disease: Secondary | ICD-10-CM | POA: Diagnosis not present

## 2017-12-18 DIAGNOSIS — D509 Iron deficiency anemia, unspecified: Secondary | ICD-10-CM | POA: Diagnosis not present

## 2017-12-18 DIAGNOSIS — N2581 Secondary hyperparathyroidism of renal origin: Secondary | ICD-10-CM | POA: Diagnosis not present

## 2017-12-20 DIAGNOSIS — D509 Iron deficiency anemia, unspecified: Secondary | ICD-10-CM | POA: Diagnosis not present

## 2017-12-20 DIAGNOSIS — N186 End stage renal disease: Secondary | ICD-10-CM | POA: Diagnosis not present

## 2017-12-20 DIAGNOSIS — N2581 Secondary hyperparathyroidism of renal origin: Secondary | ICD-10-CM | POA: Diagnosis not present

## 2017-12-23 DIAGNOSIS — D509 Iron deficiency anemia, unspecified: Secondary | ICD-10-CM | POA: Diagnosis not present

## 2017-12-23 DIAGNOSIS — N2581 Secondary hyperparathyroidism of renal origin: Secondary | ICD-10-CM | POA: Diagnosis not present

## 2017-12-23 DIAGNOSIS — N186 End stage renal disease: Secondary | ICD-10-CM | POA: Diagnosis not present

## 2017-12-25 DIAGNOSIS — N2581 Secondary hyperparathyroidism of renal origin: Secondary | ICD-10-CM | POA: Diagnosis not present

## 2017-12-25 DIAGNOSIS — D509 Iron deficiency anemia, unspecified: Secondary | ICD-10-CM | POA: Diagnosis not present

## 2017-12-25 DIAGNOSIS — N186 End stage renal disease: Secondary | ICD-10-CM | POA: Diagnosis not present

## 2017-12-27 DIAGNOSIS — N186 End stage renal disease: Secondary | ICD-10-CM | POA: Diagnosis not present

## 2017-12-27 DIAGNOSIS — D509 Iron deficiency anemia, unspecified: Secondary | ICD-10-CM | POA: Diagnosis not present

## 2017-12-27 DIAGNOSIS — N2581 Secondary hyperparathyroidism of renal origin: Secondary | ICD-10-CM | POA: Diagnosis not present

## 2017-12-30 DIAGNOSIS — N186 End stage renal disease: Secondary | ICD-10-CM | POA: Diagnosis not present

## 2017-12-30 DIAGNOSIS — D509 Iron deficiency anemia, unspecified: Secondary | ICD-10-CM | POA: Diagnosis not present

## 2017-12-30 DIAGNOSIS — N2581 Secondary hyperparathyroidism of renal origin: Secondary | ICD-10-CM | POA: Diagnosis not present

## 2017-12-31 ENCOUNTER — Other Ambulatory Visit: Payer: Self-pay | Admitting: Family Medicine

## 2017-12-31 ENCOUNTER — Ambulatory Visit (INDEPENDENT_AMBULATORY_CARE_PROVIDER_SITE_OTHER): Payer: Medicare Other | Admitting: Family Medicine

## 2017-12-31 ENCOUNTER — Encounter: Payer: Self-pay | Admitting: Family Medicine

## 2017-12-31 VITALS — BP 136/69 | HR 66 | Temp 98.0°F | Resp 16 | Ht 68.0 in | Wt 182.0 lb

## 2017-12-31 DIAGNOSIS — I5022 Chronic systolic (congestive) heart failure: Secondary | ICD-10-CM

## 2017-12-31 DIAGNOSIS — I120 Hypertensive chronic kidney disease with stage 5 chronic kidney disease or end stage renal disease: Secondary | ICD-10-CM

## 2017-12-31 DIAGNOSIS — Z992 Dependence on renal dialysis: Secondary | ICD-10-CM | POA: Diagnosis not present

## 2017-12-31 DIAGNOSIS — E1122 Type 2 diabetes mellitus with diabetic chronic kidney disease: Secondary | ICD-10-CM

## 2017-12-31 DIAGNOSIS — N186 End stage renal disease: Secondary | ICD-10-CM

## 2017-12-31 DIAGNOSIS — I2510 Atherosclerotic heart disease of native coronary artery without angina pectoris: Secondary | ICD-10-CM

## 2017-12-31 DIAGNOSIS — E1169 Type 2 diabetes mellitus with other specified complication: Secondary | ICD-10-CM

## 2017-12-31 DIAGNOSIS — E559 Vitamin D deficiency, unspecified: Secondary | ICD-10-CM

## 2017-12-31 DIAGNOSIS — E785 Hyperlipidemia, unspecified: Secondary | ICD-10-CM

## 2017-12-31 LAB — POCT GLYCOSYLATED HEMOGLOBIN (HGB A1C): Hemoglobin A1C: 4.9 (ref ?–5.7)

## 2017-12-31 NOTE — Patient Instructions (Addendum)
Thank you for coming to the office today.  A1c 4.9 - great result, best sugar result in a long time, you are off all diabetic medications for long time, this is a result of your lifestyle improvement overall.  Due for Diabetic Eye Exam - go ahead and schedule  Please have them fax [place] a copy of your Diabetic Eye Report  [facility]  [phone] [place] [address]  DUE for FASTING BLOOD WORK (no food or drink after midnight before the lab appointment, only water or coffee without cream/sugar on the morning of)  SCHEDULE "Lab Only" visit in the morning at the clinic for lab draw in 6 MONTHS   - Make sure Lab Only appointment is at about 1 week before your next appointment, so that results will be available  For Lab Results, once available within 2-3 days of blood draw, you can can log in to MyChart online to view your results and a brief explanation. Also, we can discuss results at next follow-up visit.   Please schedule a Follow-up Appointment to: Return in about 6 months (around 07/03/2018) for Yearly Medicare Checkup.  If you have any other questions or concerns, please feel free to call the office or send a message through [place]. You may also schedule an earlier appointment if necessary.  Additionally, you may be receiving a survey about your experience at our office within a few days to 1 week by e-mail or mail. We value your feedback.  Nobie Putnam, DO [place]

## 2017-12-31 NOTE — Progress Notes (Signed)
Subjective:    Patient ID: Luis Walter, male    DOB: 12-15-1950, 67 y.o.   MRN: [id number]  Luis Walter is a 67 y.o. male presenting on 12/31/2017 for Diabetes  Patient accompanied by wife, Katrina, who provides additional history.  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 Continues to follow improved diet and improve exercise. CBGs: Checks CBGs x3 weekly at HD, avg unchanged 100-120s on avg - highest to report was < 150 Meds: None currently Lifestyle: - Diet (continues to adhereto DM diet, drinks water and sugar free lemonade) - Exercise (keeping up with increased daily exercise with walking and andoutdoor activities now more with warmer weather) - Followed by Optometry [facility], Dr Bishop [place]), last eye exam 09/2016 - they will schedule apt and fax [place] copy of report - Admits he still voids small amount of urine regularly Denies hypoglycemia, polyuria, visual changes, numbness or tingling.  CHRONIC HTN: Reports no concerns, BP is controlled often when checked at HD but can run risk of lower reading at times during HD. He has no new changes since last visit. Current Meds - Metoprolol 12.5 to 25mg  PM only (half or whole tab), often skips AM dose - due to low BP at HD Reports good compliance, took meds today. Tolerating well, w/o complaints. Denies CP, dyspnea, HA, edema, dizziness / lightheadedness  CHF Systolic, Reduced EF Followed by [facility] Cardiology, last evaluation 2018. Prior EF 25-30%. Currently admits no significant change in fluid or swelling. Needs to schedule to see Cardiology   Depression screen [facility] 2/9 07/02/2017 04/09/2017 07/07/2015  Decreased Interest 0 0 0  Down, Depressed, Hopeless 0 0 0  PHQ - 2 Score 0 0 0    Social History   Tobacco Use  . Smoking status: Former Smoker    Last attempt to quit: 08/20/2006    Years since quitting: 11.3  . Smokeless tobacco: Never Used  .  Tobacco comment: estimated quit date   Substance Use Topics  . Alcohol use: No    Alcohol/week: 0.0 oz  . Drug use: No    Review of Systems Per HPI unless specifically indicated above     Objective:    BP 136/69   Pulse 66   Temp 98 F (36.7 C) (Oral)   Resp 16   Ht 5\' 8"  (1.727 m)   Wt 182 lb (82.6 kg)   BMI 27.67 kg/m   Wt Readings from Last 3 Encounters:  12/31/17 182 lb (82.6 kg)  09/20/17 190 lb (86.2 kg)  07/02/17 185 lb (83.9 kg)    Physical Exam  Constitutional: He is oriented to person, place, and time. He appears well-developed and well-nourished. No distress.  Chronically ill but currently well appearing, comfortable, cooperative  HENT:  Head: Normocephalic and atraumatic.  Mouth/Throat: Oropharynx is clear and moist.  Eyes: Conjunctivae are normal. Right eye exhibits no discharge. Left eye exhibits no discharge.  Neck: Normal range of motion. Neck supple.  Cardiovascular: Normal rate, regular rhythm, normal heart sounds and intact distal pulses.  No murmur heard. Left upper extremity with AVF - palpable flow vibration  Pulmonary/Chest: Effort normal and breath sounds normal. No respiratory distress. He has no wheezes. He has no rales.  Good air movement, speaks full sentences. No focal abnormalities  Musculoskeletal: He exhibits no edema or tenderness.  Lymphadenopathy:    He has no cervical adenopathy.  Neurological: He is alert and  oriented to person, place, and time.  Skin: Skin is warm and dry. No rash noted. He is not diaphoretic. No erythema.  Psychiatric: He has a normal mood and affect. His behavior is normal.  Well groomed, good eye contact, normal speech and thoughts  Nursing note and vitals reviewed.    Diabetic Foot Exam - Simple   Simple Foot Form Diabetic Foot exam was performed with the following findings:  Yes 12/31/2017 10:12 AM  Visual Inspection See comments:  Yes Sensation Testing See comments:  Yes Pulse Check Posterior  Tibialis and Dorsalis pulse intact bilaterally:  Yes Comments Bilateral thickening toenails trimmed well. Several areas of dry callus formation with some improvement. Several areas of reduced monofilament testing but has sensation difficulty localizing sensation.    Recent Labs    01/24/17 0946 07/02/17 1042 12/31/17 1002  HGBA1C 5.2 5.4 4.9    Results for orders placed or performed in visit on 12/31/17  POCT HgB A1C  Result Value Ref Range   Hemoglobin A1C 4.9 5.7      Assessment & Plan:   Problem List Items Addressed This Visit    Benign hypertension with ESRD (end-stage renal disease) ([place])    Well-controlled HTN. Normal readings at HD but has had hypotension at HD in past Followed by Cardiology/Nephrology  Complicated by ESRD, on HD MWF (No ACEi/ARB)   Plan:  1.  CONTINUE Metoprolol 12.5 to 25mg  (half or whole tab) nightly - avoid AM dose d/t HD - avoid XL 2. Encourage improved lifestyle - low sodium diet, regular exercise as tolerated 3. Follow-up q 6 months      Chronic systolic CHF (congestive heart failure) ([place])    Stable currently euvolemic despite ESRD on HD and CHF Previous EF most recent 25-30% per [facility] Cardiology, last visit 2018 On BB low dose only, unable to tolerate other CHF meds d/t risk of hypotension w/ HD Reassurance, may return to Cardiology as planned      Type 2 diabetes mellitus with ESRD (end-stage renal disease) ([place]) - Primary    Still very well controlled without medicine, on lifestyle and ESRD A1c now 4.9 today, from prior low 5s Not on medications for >2.5 years  Plan: 1. No change today, continue off meds - Counseling on diet and avoid reduced portions goal to eat appropriate amount, maintain weight, stay active 2. Cautioned hypoglycemia protocol 3. On ASA, statin. No ACE/ARB due to ESRD 4. Follow-up 6 months yearly medicare check up and labs         No orders of the defined types were placed in this encounter.   Follow up  plan: Return in about 6 months (around 07/03/2018) for Yearly Medicare Checkup.  Future labs ordered for 07/01/18 - note already had PSA done through [facility] 0.85 (11/26/17 - abstracted)  Nobie Putnam, [facility] 12/31/2017, 5:33 PM

## 2017-12-31 NOTE — Assessment & Plan Note (Addendum)
Still very well controlled without medicine, on lifestyle and ESRD A1c now 4.9 today, from prior low 5s Not on medications for >2.5 years  Plan: 1. No change today, continue off meds - Counseling on diet and avoid reduced portions goal to eat appropriate amount, maintain weight, stay active 2. Cautioned hypoglycemia protocol 3. On ASA, statin. No ACE/ARB due to ESRD 4. Follow-up 6 months yearly medicare check up and labs

## 2017-12-31 NOTE — Assessment & Plan Note (Signed)
Stable currently euvolemic despite ESRD on HD and CHF Previous EF most recent 25-30% per [facility] Cardiology, last visit 2018 On BB low dose only, unable to tolerate other CHF meds d/t risk of hypotension w/ HD Reassurance, may return to Cardiology as planned

## 2017-12-31 NOTE — Assessment & Plan Note (Signed)
Well-controlled HTN. Normal readings at HD but has had hypotension at HD in past Followed by Cardiology/Nephrology  Complicated by ESRD, on HD MWF (No ACEi/ARB)   Plan:  1.  CONTINUE Metoprolol 12.5 to 25mg  (half or whole tab) nightly - avoid AM dose d/t HD - avoid XL 2. Encourage improved lifestyle - low sodium diet, regular exercise as tolerated 3. Follow-up q 6 months

## 2018-01-01 DIAGNOSIS — N2581 Secondary hyperparathyroidism of renal origin: Secondary | ICD-10-CM | POA: Diagnosis not present

## 2018-01-01 DIAGNOSIS — D509 Iron deficiency anemia, unspecified: Secondary | ICD-10-CM | POA: Diagnosis not present

## 2018-01-01 DIAGNOSIS — N186 End stage renal disease: Secondary | ICD-10-CM | POA: Diagnosis not present

## 2018-01-03 DIAGNOSIS — D509 Iron deficiency anemia, unspecified: Secondary | ICD-10-CM | POA: Diagnosis not present

## 2018-01-03 DIAGNOSIS — N2581 Secondary hyperparathyroidism of renal origin: Secondary | ICD-10-CM | POA: Diagnosis not present

## 2018-01-03 DIAGNOSIS — N186 End stage renal disease: Secondary | ICD-10-CM | POA: Diagnosis not present

## 2018-01-06 DIAGNOSIS — D509 Iron deficiency anemia, unspecified: Secondary | ICD-10-CM | POA: Diagnosis not present

## 2018-01-06 DIAGNOSIS — N2581 Secondary hyperparathyroidism of renal origin: Secondary | ICD-10-CM | POA: Diagnosis not present

## 2018-01-06 DIAGNOSIS — N186 End stage renal disease: Secondary | ICD-10-CM | POA: Diagnosis not present

## 2018-01-08 DIAGNOSIS — D509 Iron deficiency anemia, unspecified: Secondary | ICD-10-CM | POA: Diagnosis not present

## 2018-01-08 DIAGNOSIS — N186 End stage renal disease: Secondary | ICD-10-CM | POA: Diagnosis not present

## 2018-01-08 DIAGNOSIS — N2581 Secondary hyperparathyroidism of renal origin: Secondary | ICD-10-CM | POA: Diagnosis not present

## 2018-01-10 DIAGNOSIS — N186 End stage renal disease: Secondary | ICD-10-CM | POA: Diagnosis not present

## 2018-01-10 DIAGNOSIS — D509 Iron deficiency anemia, unspecified: Secondary | ICD-10-CM | POA: Diagnosis not present

## 2018-01-10 DIAGNOSIS — N2581 Secondary hyperparathyroidism of renal origin: Secondary | ICD-10-CM | POA: Diagnosis not present

## 2018-01-13 DIAGNOSIS — N2581 Secondary hyperparathyroidism of renal origin: Secondary | ICD-10-CM | POA: Diagnosis not present

## 2018-01-13 DIAGNOSIS — N186 End stage renal disease: Secondary | ICD-10-CM | POA: Diagnosis not present

## 2018-01-13 DIAGNOSIS — D509 Iron deficiency anemia, unspecified: Secondary | ICD-10-CM | POA: Diagnosis not present

## 2018-01-15 DIAGNOSIS — D509 Iron deficiency anemia, unspecified: Secondary | ICD-10-CM | POA: Diagnosis not present

## 2018-01-15 DIAGNOSIS — N186 End stage renal disease: Secondary | ICD-10-CM | POA: Diagnosis not present

## 2018-01-15 DIAGNOSIS — N2581 Secondary hyperparathyroidism of renal origin: Secondary | ICD-10-CM | POA: Diagnosis not present

## 2018-01-17 DIAGNOSIS — E1122 Type 2 diabetes mellitus with diabetic chronic kidney disease: Secondary | ICD-10-CM | POA: Diagnosis not present

## 2018-01-17 DIAGNOSIS — N2581 Secondary hyperparathyroidism of renal origin: Secondary | ICD-10-CM | POA: Diagnosis not present

## 2018-01-17 DIAGNOSIS — Z992 Dependence on renal dialysis: Secondary | ICD-10-CM | POA: Diagnosis not present

## 2018-01-17 DIAGNOSIS — N186 End stage renal disease: Secondary | ICD-10-CM | POA: Diagnosis not present

## 2018-01-17 DIAGNOSIS — D509 Iron deficiency anemia, unspecified: Secondary | ICD-10-CM | POA: Diagnosis not present

## 2018-01-20 DIAGNOSIS — N2581 Secondary hyperparathyroidism of renal origin: Secondary | ICD-10-CM | POA: Diagnosis not present

## 2018-01-20 DIAGNOSIS — D509 Iron deficiency anemia, unspecified: Secondary | ICD-10-CM | POA: Diagnosis not present

## 2018-01-20 DIAGNOSIS — N186 End stage renal disease: Secondary | ICD-10-CM | POA: Diagnosis not present

## 2018-01-22 DIAGNOSIS — N186 End stage renal disease: Secondary | ICD-10-CM | POA: Diagnosis not present

## 2018-01-22 DIAGNOSIS — N2581 Secondary hyperparathyroidism of renal origin: Secondary | ICD-10-CM | POA: Diagnosis not present

## 2018-01-22 DIAGNOSIS — D509 Iron deficiency anemia, unspecified: Secondary | ICD-10-CM | POA: Diagnosis not present

## 2018-01-24 DIAGNOSIS — N2581 Secondary hyperparathyroidism of renal origin: Secondary | ICD-10-CM | POA: Diagnosis not present

## 2018-01-24 DIAGNOSIS — N186 End stage renal disease: Secondary | ICD-10-CM | POA: Diagnosis not present

## 2018-01-24 DIAGNOSIS — D509 Iron deficiency anemia, unspecified: Secondary | ICD-10-CM | POA: Diagnosis not present

## 2018-01-27 DIAGNOSIS — D509 Iron deficiency anemia, unspecified: Secondary | ICD-10-CM | POA: Diagnosis not present

## 2018-01-27 DIAGNOSIS — N186 End stage renal disease: Secondary | ICD-10-CM | POA: Diagnosis not present

## 2018-01-27 DIAGNOSIS — N2581 Secondary hyperparathyroidism of renal origin: Secondary | ICD-10-CM | POA: Diagnosis not present

## 2018-01-29 DIAGNOSIS — N2581 Secondary hyperparathyroidism of renal origin: Secondary | ICD-10-CM | POA: Diagnosis not present

## 2018-01-29 DIAGNOSIS — D509 Iron deficiency anemia, unspecified: Secondary | ICD-10-CM | POA: Diagnosis not present

## 2018-01-29 DIAGNOSIS — N186 End stage renal disease: Secondary | ICD-10-CM | POA: Diagnosis not present

## 2018-01-31 DIAGNOSIS — D509 Iron deficiency anemia, unspecified: Secondary | ICD-10-CM | POA: Diagnosis not present

## 2018-01-31 DIAGNOSIS — N2581 Secondary hyperparathyroidism of renal origin: Secondary | ICD-10-CM | POA: Diagnosis not present

## 2018-01-31 DIAGNOSIS — N186 End stage renal disease: Secondary | ICD-10-CM | POA: Diagnosis not present

## 2018-02-03 DIAGNOSIS — N186 End stage renal disease: Secondary | ICD-10-CM | POA: Diagnosis not present

## 2018-02-03 DIAGNOSIS — D509 Iron deficiency anemia, unspecified: Secondary | ICD-10-CM | POA: Diagnosis not present

## 2018-02-03 DIAGNOSIS — N2581 Secondary hyperparathyroidism of renal origin: Secondary | ICD-10-CM | POA: Diagnosis not present

## 2018-02-05 DIAGNOSIS — N186 End stage renal disease: Secondary | ICD-10-CM | POA: Diagnosis not present

## 2018-02-05 DIAGNOSIS — D509 Iron deficiency anemia, unspecified: Secondary | ICD-10-CM | POA: Diagnosis not present

## 2018-02-05 DIAGNOSIS — N2581 Secondary hyperparathyroidism of renal origin: Secondary | ICD-10-CM | POA: Diagnosis not present

## 2018-02-07 DIAGNOSIS — D509 Iron deficiency anemia, unspecified: Secondary | ICD-10-CM | POA: Diagnosis not present

## 2018-02-07 DIAGNOSIS — N2581 Secondary hyperparathyroidism of renal origin: Secondary | ICD-10-CM | POA: Diagnosis not present

## 2018-02-07 DIAGNOSIS — N186 End stage renal disease: Secondary | ICD-10-CM | POA: Diagnosis not present

## 2018-02-10 DIAGNOSIS — D509 Iron deficiency anemia, unspecified: Secondary | ICD-10-CM | POA: Diagnosis not present

## 2018-02-10 DIAGNOSIS — N2581 Secondary hyperparathyroidism of renal origin: Secondary | ICD-10-CM | POA: Diagnosis not present

## 2018-02-10 DIAGNOSIS — N186 End stage renal disease: Secondary | ICD-10-CM | POA: Diagnosis not present

## 2018-02-12 DIAGNOSIS — D509 Iron deficiency anemia, unspecified: Secondary | ICD-10-CM | POA: Diagnosis not present

## 2018-02-12 DIAGNOSIS — N186 End stage renal disease: Secondary | ICD-10-CM | POA: Diagnosis not present

## 2018-02-12 DIAGNOSIS — N2581 Secondary hyperparathyroidism of renal origin: Secondary | ICD-10-CM | POA: Diagnosis not present

## 2018-02-14 DIAGNOSIS — N2581 Secondary hyperparathyroidism of renal origin: Secondary | ICD-10-CM | POA: Diagnosis not present

## 2018-02-14 DIAGNOSIS — D509 Iron deficiency anemia, unspecified: Secondary | ICD-10-CM | POA: Diagnosis not present

## 2018-02-14 DIAGNOSIS — N186 End stage renal disease: Secondary | ICD-10-CM | POA: Diagnosis not present

## 2018-02-16 DIAGNOSIS — N186 End stage renal disease: Secondary | ICD-10-CM | POA: Diagnosis not present

## 2018-02-16 DIAGNOSIS — E1122 Type 2 diabetes mellitus with diabetic chronic kidney disease: Secondary | ICD-10-CM | POA: Diagnosis not present

## 2018-02-16 DIAGNOSIS — Z992 Dependence on renal dialysis: Secondary | ICD-10-CM | POA: Diagnosis not present

## 2018-02-17 DIAGNOSIS — N2581 Secondary hyperparathyroidism of renal origin: Secondary | ICD-10-CM | POA: Diagnosis not present

## 2018-02-17 DIAGNOSIS — N186 End stage renal disease: Secondary | ICD-10-CM | POA: Diagnosis not present

## 2018-02-19 DIAGNOSIS — N2581 Secondary hyperparathyroidism of renal origin: Secondary | ICD-10-CM | POA: Diagnosis not present

## 2018-02-19 DIAGNOSIS — N186 End stage renal disease: Secondary | ICD-10-CM | POA: Diagnosis not present

## 2018-02-21 DIAGNOSIS — N2581 Secondary hyperparathyroidism of renal origin: Secondary | ICD-10-CM | POA: Diagnosis not present

## 2018-02-21 DIAGNOSIS — N186 End stage renal disease: Secondary | ICD-10-CM | POA: Diagnosis not present

## 2018-02-24 DIAGNOSIS — N186 End stage renal disease: Secondary | ICD-10-CM | POA: Diagnosis not present

## 2018-02-24 DIAGNOSIS — N2581 Secondary hyperparathyroidism of renal origin: Secondary | ICD-10-CM | POA: Diagnosis not present

## 2018-02-26 DIAGNOSIS — N186 End stage renal disease: Secondary | ICD-10-CM | POA: Diagnosis not present

## 2018-02-26 DIAGNOSIS — N2581 Secondary hyperparathyroidism of renal origin: Secondary | ICD-10-CM | POA: Diagnosis not present

## 2018-02-28 DIAGNOSIS — N2581 Secondary hyperparathyroidism of renal origin: Secondary | ICD-10-CM | POA: Diagnosis not present

## 2018-02-28 DIAGNOSIS — N186 End stage renal disease: Secondary | ICD-10-CM | POA: Diagnosis not present

## 2018-03-03 DIAGNOSIS — N2581 Secondary hyperparathyroidism of renal origin: Secondary | ICD-10-CM | POA: Diagnosis not present

## 2018-03-03 DIAGNOSIS — N186 End stage renal disease: Secondary | ICD-10-CM | POA: Diagnosis not present

## 2018-03-05 DIAGNOSIS — N2581 Secondary hyperparathyroidism of renal origin: Secondary | ICD-10-CM | POA: Diagnosis not present

## 2018-03-05 DIAGNOSIS — N186 End stage renal disease: Secondary | ICD-10-CM | POA: Diagnosis not present

## 2018-03-07 DIAGNOSIS — N186 End stage renal disease: Secondary | ICD-10-CM | POA: Diagnosis not present

## 2018-03-07 DIAGNOSIS — N2581 Secondary hyperparathyroidism of renal origin: Secondary | ICD-10-CM | POA: Diagnosis not present

## 2018-03-10 DIAGNOSIS — N186 End stage renal disease: Secondary | ICD-10-CM | POA: Diagnosis not present

## 2018-03-10 DIAGNOSIS — N2581 Secondary hyperparathyroidism of renal origin: Secondary | ICD-10-CM | POA: Diagnosis not present

## 2018-03-12 DIAGNOSIS — N2581 Secondary hyperparathyroidism of renal origin: Secondary | ICD-10-CM | POA: Diagnosis not present

## 2018-03-12 DIAGNOSIS — N186 End stage renal disease: Secondary | ICD-10-CM | POA: Diagnosis not present

## 2018-03-14 DIAGNOSIS — N2581 Secondary hyperparathyroidism of renal origin: Secondary | ICD-10-CM | POA: Diagnosis not present

## 2018-03-14 DIAGNOSIS — N186 End stage renal disease: Secondary | ICD-10-CM | POA: Diagnosis not present

## 2018-03-17 DIAGNOSIS — N2581 Secondary hyperparathyroidism of renal origin: Secondary | ICD-10-CM | POA: Diagnosis not present

## 2018-03-17 DIAGNOSIS — N186 End stage renal disease: Secondary | ICD-10-CM | POA: Diagnosis not present

## 2018-03-19 DIAGNOSIS — Z992 Dependence on renal dialysis: Secondary | ICD-10-CM | POA: Diagnosis not present

## 2018-03-19 DIAGNOSIS — N2581 Secondary hyperparathyroidism of renal origin: Secondary | ICD-10-CM | POA: Diagnosis not present

## 2018-03-19 DIAGNOSIS — E1122 Type 2 diabetes mellitus with diabetic chronic kidney disease: Secondary | ICD-10-CM | POA: Diagnosis not present

## 2018-03-19 DIAGNOSIS — N186 End stage renal disease: Secondary | ICD-10-CM | POA: Diagnosis not present

## 2018-03-21 DIAGNOSIS — D631 Anemia in chronic kidney disease: Secondary | ICD-10-CM | POA: Diagnosis not present

## 2018-03-21 DIAGNOSIS — N2581 Secondary hyperparathyroidism of renal origin: Secondary | ICD-10-CM | POA: Diagnosis not present

## 2018-03-21 DIAGNOSIS — N186 End stage renal disease: Secondary | ICD-10-CM | POA: Diagnosis not present

## 2018-03-24 DIAGNOSIS — D631 Anemia in chronic kidney disease: Secondary | ICD-10-CM | POA: Diagnosis not present

## 2018-03-24 DIAGNOSIS — N2581 Secondary hyperparathyroidism of renal origin: Secondary | ICD-10-CM | POA: Diagnosis not present

## 2018-03-24 DIAGNOSIS — N186 End stage renal disease: Secondary | ICD-10-CM | POA: Diagnosis not present

## 2018-03-26 DIAGNOSIS — N2581 Secondary hyperparathyroidism of renal origin: Secondary | ICD-10-CM | POA: Diagnosis not present

## 2018-03-26 DIAGNOSIS — D631 Anemia in chronic kidney disease: Secondary | ICD-10-CM | POA: Diagnosis not present

## 2018-03-26 DIAGNOSIS — N186 End stage renal disease: Secondary | ICD-10-CM | POA: Diagnosis not present

## 2018-03-28 DIAGNOSIS — N2581 Secondary hyperparathyroidism of renal origin: Secondary | ICD-10-CM | POA: Diagnosis not present

## 2018-03-28 DIAGNOSIS — N186 End stage renal disease: Secondary | ICD-10-CM | POA: Diagnosis not present

## 2018-03-28 DIAGNOSIS — D631 Anemia in chronic kidney disease: Secondary | ICD-10-CM | POA: Diagnosis not present

## 2018-03-31 DIAGNOSIS — N2581 Secondary hyperparathyroidism of renal origin: Secondary | ICD-10-CM | POA: Diagnosis not present

## 2018-03-31 DIAGNOSIS — D631 Anemia in chronic kidney disease: Secondary | ICD-10-CM | POA: Diagnosis not present

## 2018-03-31 DIAGNOSIS — N186 End stage renal disease: Secondary | ICD-10-CM | POA: Diagnosis not present

## 2018-04-02 DIAGNOSIS — N186 End stage renal disease: Secondary | ICD-10-CM | POA: Diagnosis not present

## 2018-04-02 DIAGNOSIS — D631 Anemia in chronic kidney disease: Secondary | ICD-10-CM | POA: Diagnosis not present

## 2018-04-02 DIAGNOSIS — N2581 Secondary hyperparathyroidism of renal origin: Secondary | ICD-10-CM | POA: Diagnosis not present

## 2018-04-04 DIAGNOSIS — N2581 Secondary hyperparathyroidism of renal origin: Secondary | ICD-10-CM | POA: Diagnosis not present

## 2018-04-04 DIAGNOSIS — D631 Anemia in chronic kidney disease: Secondary | ICD-10-CM | POA: Diagnosis not present

## 2018-04-04 DIAGNOSIS — N186 End stage renal disease: Secondary | ICD-10-CM | POA: Diagnosis not present

## 2018-04-07 DIAGNOSIS — D631 Anemia in chronic kidney disease: Secondary | ICD-10-CM | POA: Diagnosis not present

## 2018-04-07 DIAGNOSIS — N2581 Secondary hyperparathyroidism of renal origin: Secondary | ICD-10-CM | POA: Diagnosis not present

## 2018-04-07 DIAGNOSIS — N186 End stage renal disease: Secondary | ICD-10-CM | POA: Diagnosis not present

## 2018-04-09 DIAGNOSIS — N186 End stage renal disease: Secondary | ICD-10-CM | POA: Diagnosis not present

## 2018-04-09 DIAGNOSIS — N2581 Secondary hyperparathyroidism of renal origin: Secondary | ICD-10-CM | POA: Diagnosis not present

## 2018-04-09 DIAGNOSIS — D631 Anemia in chronic kidney disease: Secondary | ICD-10-CM | POA: Diagnosis not present

## 2018-04-11 DIAGNOSIS — D631 Anemia in chronic kidney disease: Secondary | ICD-10-CM | POA: Diagnosis not present

## 2018-04-11 DIAGNOSIS — N186 End stage renal disease: Secondary | ICD-10-CM | POA: Diagnosis not present

## 2018-04-11 DIAGNOSIS — N2581 Secondary hyperparathyroidism of renal origin: Secondary | ICD-10-CM | POA: Diagnosis not present

## 2018-04-14 DIAGNOSIS — D631 Anemia in chronic kidney disease: Secondary | ICD-10-CM | POA: Diagnosis not present

## 2018-04-14 DIAGNOSIS — N2581 Secondary hyperparathyroidism of renal origin: Secondary | ICD-10-CM | POA: Diagnosis not present

## 2018-04-14 DIAGNOSIS — N186 End stage renal disease: Secondary | ICD-10-CM | POA: Diagnosis not present

## 2018-04-16 DIAGNOSIS — N2581 Secondary hyperparathyroidism of renal origin: Secondary | ICD-10-CM | POA: Diagnosis not present

## 2018-04-16 DIAGNOSIS — D631 Anemia in chronic kidney disease: Secondary | ICD-10-CM | POA: Diagnosis not present

## 2018-04-16 DIAGNOSIS — N186 End stage renal disease: Secondary | ICD-10-CM | POA: Diagnosis not present

## 2018-04-17 DIAGNOSIS — E1122 Type 2 diabetes mellitus with diabetic chronic kidney disease: Secondary | ICD-10-CM | POA: Diagnosis not present

## 2018-04-17 DIAGNOSIS — N186 End stage renal disease: Secondary | ICD-10-CM | POA: Diagnosis not present

## 2018-04-17 DIAGNOSIS — Z992 Dependence on renal dialysis: Secondary | ICD-10-CM | POA: Diagnosis not present

## 2018-04-18 DIAGNOSIS — N2581 Secondary hyperparathyroidism of renal origin: Secondary | ICD-10-CM | POA: Diagnosis not present

## 2018-04-18 DIAGNOSIS — D631 Anemia in chronic kidney disease: Secondary | ICD-10-CM | POA: Diagnosis not present

## 2018-04-18 DIAGNOSIS — I469 Cardiac arrest, cause unspecified: Secondary | ICD-10-CM | POA: Diagnosis not present

## 2018-04-18 DIAGNOSIS — N186 End stage renal disease: Secondary | ICD-10-CM | POA: Diagnosis not present

## 2018-04-19 DIAGNOSIS — N186 End stage renal disease: Secondary | ICD-10-CM | POA: Diagnosis not present

## 2018-04-19 DIAGNOSIS — E1122 Type 2 diabetes mellitus with diabetic chronic kidney disease: Secondary | ICD-10-CM | POA: Diagnosis not present

## 2018-04-19 DIAGNOSIS — Z992 Dependence on renal dialysis: Secondary | ICD-10-CM | POA: Diagnosis not present

## 2018-04-20 DIAGNOSIS — 419620001 Death: Secondary | SNOMED CT | POA: Diagnosis not present

## 2018-04-20 DEATH — deceased

## 2018-04-23 ENCOUNTER — Encounter: Payer: Self-pay | Admitting: Family Medicine

## 2018-04-23 NOTE — Progress Notes (Signed)
Notified today 04/23/18 by phone from patient's spouse that he has passed. [facility] home [facility]) has dropped off death certificate for completion. I did not receive other clinical information at this time. Chart was reviewed. Death certicicate completed, with most likely immediate cause of death being congestive heart failure, systolic - caused by atherosclerotic coronary artery disease, hypertensive heart disease and HTN. Other secondary conditions - ESRD on HD, Diabetes, Hyperlipidemia.  To be scanned into chart and notify funeral home for pick up.  Nobie Putnam, DO [facility] 04/23/2018, 1:15 PM

## 2018-07-01 ENCOUNTER — Other Ambulatory Visit: Payer: Medicare Other

## 2018-07-03 ENCOUNTER — Ambulatory Visit: Payer: Medicare Other | Admitting: Family Medicine
